# Patient Record
Sex: Female | Born: 1960 | Race: White | Hispanic: No | Smoking: Current every day smoker
Health system: Southern US, Community
[De-identification: ages and names within clinical notes are randomized; demographics above are authoritative.]

## PROBLEM LIST (undated history)

## (undated) DIAGNOSIS — R3911 Hesitancy of micturition: Secondary | ICD-10-CM

## (undated) DIAGNOSIS — J189 Pneumonia, unspecified organism: Secondary | ICD-10-CM

## (undated) DIAGNOSIS — M25519 Pain in unspecified shoulder: Secondary | ICD-10-CM

## (undated) DIAGNOSIS — F4320 Adjustment disorder, unspecified: Secondary | ICD-10-CM

## (undated) DIAGNOSIS — M2012 Hallux valgus (acquired), left foot: Secondary | ICD-10-CM

## (undated) DIAGNOSIS — E119 Type 2 diabetes mellitus without complications: Secondary | ICD-10-CM

## (undated) DIAGNOSIS — M75102 Unspecified rotator cuff tear or rupture of left shoulder, not specified as traumatic: Secondary | ICD-10-CM

## (undated) DIAGNOSIS — R7303 Prediabetes: Secondary | ICD-10-CM

## (undated) DIAGNOSIS — B370 Candidal stomatitis: Secondary | ICD-10-CM

## (undated) DIAGNOSIS — F418 Other specified anxiety disorders: Secondary | ICD-10-CM

## (undated) DIAGNOSIS — R0600 Dyspnea, unspecified: Secondary | ICD-10-CM

## (undated) DIAGNOSIS — Z87442 Personal history of urinary calculi: Secondary | ICD-10-CM

## (undated) DIAGNOSIS — F4321 Adjustment disorder with depressed mood: Secondary | ICD-10-CM

## (undated) DIAGNOSIS — I10 Essential (primary) hypertension: Secondary | ICD-10-CM

## (undated) DIAGNOSIS — D494 Neoplasm of unspecified behavior of bladder: Secondary | ICD-10-CM

## (undated) DIAGNOSIS — M75101 Unspecified rotator cuff tear or rupture of right shoulder, not specified as traumatic: Secondary | ICD-10-CM

## (undated) DIAGNOSIS — G4730 Sleep apnea, unspecified: Secondary | ICD-10-CM

## (undated) DIAGNOSIS — B351 Tinea unguium: Secondary | ICD-10-CM

## (undated) DIAGNOSIS — F3289 Other specified depressive episodes: Secondary | ICD-10-CM

## (undated) DIAGNOSIS — E785 Hyperlipidemia, unspecified: Secondary | ICD-10-CM

## (undated) DIAGNOSIS — R0602 Shortness of breath: Secondary | ICD-10-CM

## (undated) DIAGNOSIS — IMO0001 Reserved for inherently not codable concepts without codable children: Secondary | ICD-10-CM

## (undated) DIAGNOSIS — L98499 Non-pressure chronic ulcer of skin of other sites with unspecified severity: Secondary | ICD-10-CM

## (undated) DIAGNOSIS — G479 Sleep disorder, unspecified: Secondary | ICD-10-CM

## (undated) DIAGNOSIS — M21969 Unspecified acquired deformity of unspecified lower leg: Secondary | ICD-10-CM

## (undated) DIAGNOSIS — F329 Major depressive disorder, single episode, unspecified: Secondary | ICD-10-CM

## (undated) HISTORY — DX: Sleep disorder, unspecified: G47.9

## (undated) HISTORY — DX: Non-pressure chronic ulcer of skin of other sites with unspecified severity: L98.499

## (undated) HISTORY — DX: Hesitancy of micturition: R39.11

## (undated) HISTORY — DX: Reserved for inherently not codable concepts without codable children: IMO0001

## (undated) HISTORY — DX: Pain in unspecified shoulder: M25.519

## (undated) HISTORY — DX: Sleep apnea, unspecified: G47.30

## (undated) HISTORY — DX: Other specified depressive episodes: F32.89

## (undated) HISTORY — DX: Adjustment disorder with depressed mood: F43.21

## (undated) HISTORY — PX: TUBAL LIGATION: SHX77

## (undated) HISTORY — DX: Adjustment disorder, unspecified: F43.20

## (undated) HISTORY — DX: Unspecified rotator cuff tear or rupture of left shoulder, not specified as traumatic: M75.102

## (undated) HISTORY — DX: Unspecified acquired deformity of unspecified lower leg: M21.969

## (undated) HISTORY — DX: Unspecified rotator cuff tear or rupture of right shoulder, not specified as traumatic: M75.101

## (undated) HISTORY — DX: Tinea unguium: B35.1

## (undated) HISTORY — DX: Hallux valgus (acquired), left foot: M20.12

## (undated) HISTORY — DX: Essential (primary) hypertension: I10

## (undated) HISTORY — DX: Dyspnea, unspecified: R06.00

## (undated) HISTORY — DX: Shortness of breath: R06.02

## (undated) HISTORY — DX: Candidal stomatitis: B37.0

## (undated) HISTORY — DX: Other specified anxiety disorders: F41.8

## (undated) HISTORY — DX: Type 2 diabetes mellitus without complications: E11.9

## (undated) HISTORY — DX: Hyperlipidemia, unspecified: E78.5

## (undated) HISTORY — DX: Major depressive disorder, single episode, unspecified: F32.9

---

## 1984-08-08 HISTORY — PX: OTHER SURGICAL HISTORY: SHX169

## 1998-12-14 ENCOUNTER — Ambulatory Visit (HOSPITAL_COMMUNITY): Admission: RE | Admit: 1998-12-14 | Discharge: 1998-12-14 | Payer: Self-pay | Admitting: Family Medicine

## 2002-03-03 ENCOUNTER — Encounter: Payer: Self-pay | Admitting: Emergency Medicine

## 2002-03-03 ENCOUNTER — Emergency Department (HOSPITAL_COMMUNITY): Admission: EM | Admit: 2002-03-03 | Discharge: 2002-03-03 | Payer: Self-pay

## 2002-03-04 ENCOUNTER — Emergency Department (HOSPITAL_COMMUNITY): Admission: EM | Admit: 2002-03-04 | Discharge: 2002-03-05 | Payer: Self-pay

## 2002-03-11 ENCOUNTER — Emergency Department (HOSPITAL_COMMUNITY): Admission: EM | Admit: 2002-03-11 | Discharge: 2002-03-11 | Payer: Self-pay | Admitting: Emergency Medicine

## 2002-03-11 ENCOUNTER — Encounter: Payer: Self-pay | Admitting: Emergency Medicine

## 2002-03-12 ENCOUNTER — Encounter: Payer: Self-pay | Admitting: Emergency Medicine

## 2002-03-12 ENCOUNTER — Inpatient Hospital Stay (HOSPITAL_COMMUNITY): Admission: EM | Admit: 2002-03-12 | Discharge: 2002-03-15 | Payer: Self-pay | Admitting: Emergency Medicine

## 2002-03-12 ENCOUNTER — Ambulatory Visit (HOSPITAL_COMMUNITY): Admission: RE | Admit: 2002-03-12 | Discharge: 2002-03-12 | Payer: Self-pay | Admitting: Emergency Medicine

## 2002-03-13 ENCOUNTER — Encounter: Payer: Self-pay | Admitting: Surgery

## 2002-03-18 ENCOUNTER — Ambulatory Visit (HOSPITAL_COMMUNITY): Admission: RE | Admit: 2002-03-18 | Discharge: 2002-03-18 | Payer: Self-pay | Admitting: General Surgery

## 2002-03-18 ENCOUNTER — Encounter: Payer: Self-pay | Admitting: General Surgery

## 2002-03-20 ENCOUNTER — Ambulatory Visit (HOSPITAL_COMMUNITY): Admission: RE | Admit: 2002-03-20 | Discharge: 2002-03-20 | Payer: Self-pay | Admitting: General Surgery

## 2002-03-20 ENCOUNTER — Encounter: Payer: Self-pay | Admitting: General Surgery

## 2004-05-08 ENCOUNTER — Emergency Department (HOSPITAL_COMMUNITY): Admission: EM | Admit: 2004-05-08 | Discharge: 2004-05-08 | Payer: Self-pay | Admitting: Unknown Physician Specialty

## 2004-05-11 ENCOUNTER — Emergency Department (HOSPITAL_COMMUNITY): Admission: EM | Admit: 2004-05-11 | Discharge: 2004-05-11 | Payer: Self-pay | Admitting: Emergency Medicine

## 2004-05-19 ENCOUNTER — Emergency Department (HOSPITAL_COMMUNITY): Admission: EM | Admit: 2004-05-19 | Discharge: 2004-05-20 | Payer: Self-pay | Admitting: Emergency Medicine

## 2004-05-19 ENCOUNTER — Emergency Department (HOSPITAL_COMMUNITY): Admission: EM | Admit: 2004-05-19 | Discharge: 2004-05-19 | Payer: Self-pay | Admitting: Emergency Medicine

## 2004-09-01 ENCOUNTER — Other Ambulatory Visit: Admission: RE | Admit: 2004-09-01 | Discharge: 2004-09-01 | Payer: Self-pay | Admitting: Family Medicine

## 2005-02-01 ENCOUNTER — Encounter: Admission: RE | Admit: 2005-02-01 | Discharge: 2005-02-01 | Payer: Self-pay | Admitting: Family Medicine

## 2006-12-08 ENCOUNTER — Ambulatory Visit: Payer: Self-pay | Admitting: Internal Medicine

## 2006-12-15 ENCOUNTER — Ambulatory Visit: Payer: Self-pay | Admitting: Internal Medicine

## 2006-12-15 LAB — CONVERTED CEMR LAB
ALT: 21 units/L (ref 0–40)
AST: 16 units/L (ref 0–37)
Albumin: 3.6 g/dL (ref 3.5–5.2)
Alkaline Phosphatase: 49 units/L (ref 39–117)
BUN: 17 mg/dL (ref 6–23)
Basophils Absolute: 0 10*3/uL (ref 0.0–0.1)
Basophils Relative: 0.4 % (ref 0.0–1.0)
Bilirubin Urine: NEGATIVE
Bilirubin, Direct: 0.1 mg/dL (ref 0.0–0.3)
CO2: 30 meq/L (ref 19–32)
Calcium: 9 mg/dL (ref 8.4–10.5)
Chloride: 108 meq/L (ref 96–112)
Cholesterol: 197 mg/dL (ref 0–200)
Creatinine, Ser: 0.5 mg/dL (ref 0.4–1.2)
Eosinophils Absolute: 0.1 10*3/uL (ref 0.0–0.6)
Eosinophils Relative: 1.8 % (ref 0.0–5.0)
GFR calc Af Amer: 170 mL/min
GFR calc non Af Amer: 141 mL/min
Glucose, Bld: 135 mg/dL — ABNORMAL HIGH (ref 70–99)
HCT: 39.6 % (ref 36.0–46.0)
HDL: 34.9 mg/dL — ABNORMAL LOW (ref >39.0)
Hemoglobin: 13.8 g/dL (ref 12.0–15.0)
Ketones, ur: NEGATIVE mg/dL
LDL Cholesterol: 134 mg/dL — ABNORMAL HIGH (ref 0–99)
Leukocytes, UA: NEGATIVE
Lymphocytes Relative: 21.4 % (ref 12.0–46.0)
MCHC: 34.8 g/dL (ref 30.0–36.0)
MCV: 87.5 fL (ref 78.0–100.0)
Monocytes Absolute: 0.5 10*3/uL (ref 0.2–0.7)
Monocytes Relative: 6.7 % (ref 3.0–11.0)
Neutro Abs: 5.1 10*3/uL (ref 1.4–7.7)
Neutrophils Relative %: 69.7 % (ref 43.0–77.0)
Nitrite: NEGATIVE
Platelets: 368 10*3/uL (ref 150–400)
Potassium: 4.4 meq/L (ref 3.5–5.1)
RBC: 4.53 M/uL (ref 3.87–5.11)
RDW: 12.3 % (ref 11.5–14.6)
Sed Rate: 15 mm/hr (ref 0–25)
Sodium: 141 meq/L (ref 135–145)
Specific Gravity, Urine: >=1.03 (ref 1.000–1.03)
Total Bilirubin: 0.5 mg/dL (ref 0.3–1.2)
Total CHOL/HDL Ratio: 5.6
Total Protein, Urine: NEGATIVE mg/dL
Total Protein: 7 g/dL (ref 6.0–8.3)
Triglycerides: 143 mg/dL (ref 0–149)
Urine Glucose: NEGATIVE mg/dL
Urobilinogen, UA: 0.2 (ref 0.0–1.0)
VLDL: 29 mg/dL (ref 0–40)
Vit D, 1,25-Dihydroxy: 21 (ref 20–57)
WBC: 7.2 10*3/uL (ref 4.5–10.5)
pH: 5.5 (ref 5.0–8.0)

## 2007-01-19 ENCOUNTER — Ambulatory Visit: Payer: Self-pay | Admitting: Internal Medicine

## 2007-01-19 LAB — CONVERTED CEMR LAB
Hgb A1c MFr Bld: 6 % (ref 4.6–6.0)
T3, Free: 3.1 pg/mL (ref 2.3–4.2)
T4, Total: 7.3 ug/dL (ref 5.0–12.5)
TSH: 1.12 microintl units/mL (ref 0.35–5.50)

## 2007-02-07 ENCOUNTER — Ambulatory Visit: Payer: Self-pay

## 2007-02-07 ENCOUNTER — Encounter: Payer: Self-pay | Admitting: Internal Medicine

## 2007-02-13 ENCOUNTER — Encounter: Admission: RE | Admit: 2007-02-13 | Discharge: 2007-02-13 | Payer: Self-pay | Admitting: Internal Medicine

## 2008-11-28 ENCOUNTER — Ambulatory Visit (HOSPITAL_COMMUNITY): Admission: RE | Admit: 2008-11-28 | Discharge: 2008-11-28 | Payer: Self-pay | Admitting: Internal Medicine

## 2010-06-11 ENCOUNTER — Ambulatory Visit: Payer: Self-pay | Admitting: Family Medicine

## 2010-06-11 DIAGNOSIS — F3289 Other specified depressive episodes: Secondary | ICD-10-CM | POA: Insufficient documentation

## 2010-06-11 DIAGNOSIS — F329 Major depressive disorder, single episode, unspecified: Secondary | ICD-10-CM

## 2010-06-11 DIAGNOSIS — R413 Other amnesia: Secondary | ICD-10-CM

## 2010-06-11 DIAGNOSIS — F17200 Nicotine dependence, unspecified, uncomplicated: Secondary | ICD-10-CM | POA: Insufficient documentation

## 2010-06-11 LAB — CONVERTED CEMR LAB
ALT: 18 units/L (ref 0–35)
AST: 15 units/L (ref 0–37)
Albumin: 5 g/dL (ref 3.5–5.2)
Alkaline Phosphatase: 54 units/L (ref 39–117)
BUN: 17 mg/dL (ref 6–23)
CO2: 27 meq/L (ref 19–32)
Calcium: 9.7 mg/dL (ref 8.4–10.5)
Chloride: 101 meq/L (ref 96–112)
Creatinine, Ser: 0.6 mg/dL (ref 0.40–1.20)
Direct LDL: 114 mg/dL — ABNORMAL HIGH
FSH: 105.7 milliintl units/mL
Glucose, Bld: 87 mg/dL (ref 70–99)
HCT: 44.6 % (ref 36.0–46.0)
Hemoglobin: 14.7 g/dL (ref 12.0–15.0)
MCHC: 33 g/dL (ref 30.0–36.0)
MCV: 89.4 fL (ref 78.0–100.0)
Platelets: 308 10*3/uL (ref 150–400)
Potassium: 4.6 meq/L (ref 3.5–5.3)
RBC: 4.99 M/uL (ref 3.87–5.11)
RDW: 13 % (ref 11.5–15.5)
Rheumatoid fact SerPl-aCnc: 20 intl units/mL (ref 0–20)
Sed Rate: 2 mm/hr (ref 0–22)
Sodium: 136 meq/L (ref 135–145)
TSH: 1.076 microintl units/mL (ref 0.350–4.500)
Total Bilirubin: 0.2 mg/dL — ABNORMAL LOW (ref 0.3–1.2)
Total Protein: 7.7 g/dL (ref 6.0–8.3)
Vit D, 25-Hydroxy: 45 ng/mL (ref 30–89)
WBC: 8 10*3/uL (ref 4.0–10.5)

## 2010-06-14 ENCOUNTER — Telehealth: Payer: Self-pay | Admitting: Family Medicine

## 2010-06-14 ENCOUNTER — Encounter: Payer: Self-pay | Admitting: Family Medicine

## 2010-07-23 ENCOUNTER — Ambulatory Visit: Payer: Self-pay | Admitting: Family Medicine

## 2010-08-20 ENCOUNTER — Ambulatory Visit: Admission: RE | Admit: 2010-08-20 | Discharge: 2010-08-20 | Payer: Self-pay | Source: Home / Self Care

## 2010-08-20 DIAGNOSIS — M25519 Pain in unspecified shoulder: Secondary | ICD-10-CM | POA: Insufficient documentation

## 2010-08-29 ENCOUNTER — Encounter: Payer: Self-pay | Admitting: Family Medicine

## 2010-08-30 ENCOUNTER — Other Ambulatory Visit: Payer: Self-pay | Admitting: Family Medicine

## 2010-08-30 DIAGNOSIS — Z1239 Encounter for other screening for malignant neoplasm of breast: Secondary | ICD-10-CM

## 2010-08-30 DIAGNOSIS — Z1231 Encounter for screening mammogram for malignant neoplasm of breast: Secondary | ICD-10-CM

## 2010-09-02 ENCOUNTER — Ambulatory Visit (HOSPITAL_COMMUNITY)
Admission: RE | Admit: 2010-09-02 | Discharge: 2010-09-02 | Payer: Self-pay | Source: Home / Self Care | Attending: Family Medicine | Admitting: Family Medicine

## 2010-09-03 ENCOUNTER — Encounter: Payer: Self-pay | Admitting: Family Medicine

## 2010-09-03 ENCOUNTER — Ambulatory Visit
Admission: RE | Admit: 2010-09-03 | Discharge: 2010-09-03 | Payer: Self-pay | Source: Home / Self Care | Attending: Family Medicine | Admitting: Family Medicine

## 2010-09-03 ENCOUNTER — Telehealth: Payer: Self-pay | Admitting: Family Medicine

## 2010-09-03 DIAGNOSIS — M75101 Unspecified rotator cuff tear or rupture of right shoulder, not specified as traumatic: Secondary | ICD-10-CM | POA: Insufficient documentation

## 2010-09-07 NOTE — Letter (Signed)
Summary: Generic Letter  [facility]  [address]   [place], [place] [address]   Phone: [phone]  Fax: [phone]    06/14/2010  ZELDA REAMES [address] [place], [place]  [address]  Dear Ms. Fuerstenberg,   It was a pleasure to meet you last week during your visit.  I write to let you know that your labs are all within normal limits.  Your blood count, liver and kidney tests, thyroid test and other tests for rheumatoid arthritis are all within normal ranges.   The hormone test is consistent with menopause.    Please feel free to call with any questions or concerns.  I will see you at your next visit.    Sincerely,   Paula Compton MD

## 2010-09-07 NOTE — Assessment & Plan Note (Signed)
Summary: np/eo   Vital Signs:  Patient profile:   50 year old female Height:      65 inches Weight:      139.2 pounds BMI:     23.25 Temp:     98.3 degrees F oral Pulse rate:   75 / minute BP sitting:   117 / 86  (right arm)  Vitals Entered By: Arlyss Repress CMA, (June 11, 2010 3:22 PM) CC: NEW PT. neck and arm pain x 4-5 months. flu shot today. Is Patient Diabetic? No Pain Assessment Patient in pain? yes     Location: neck/arms Onset of pain  x 3-4 months.   CC:  NEW PT. neck and arm pain x 4-5 months. flu shot today.Marland Kitchen  History of Present Illness: Vicki [name] is here as a new patient.  Her biggest complaint is that she has had pains in her neck, shoulders, elbows, low back, hips, hands and wrists.  Worse since aroudn her 49th birthday in April of this  year.  Also accompanied by worse 'nerves', for which she takes xanax ('orange football shaped"), looking up in epocrates she verifies that they are 0.5mg  tabs.  Takes about 2 per day, 'off the street'.  Expresses anhedonia; on the topic of smoking, she says taht she doubts she would quit because 'it's the one thing I enjoy in life'.   Has had more spacing of her menstrual periods.  LMP July 2011.  Alopecia, generalized.   Habits & Providers  Alcohol-Tobacco-Diet     Tobacco Status: current     Tobacco Counseling: to quit use of tobacco products     Cigarette Packs/Day: 1.0  Family History: Mother died in mid-60s of ovarian cancer, also had MI and CVA in early 48s, as well as breast cancer with mastectomy in her history. Father with heart disease.  One sibling died, others are alive and healthy.   Social History: Smokes 1ppd. Formerly drank "a lot", quit that 5 yrs ago (2005-2006).  Lives with husband.  Denies any controlled substances other than xanax 0.5mg  daily, which she gets "on the street".  Used to be prescribed this many years ago.  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 Works  2 jobs. Smoking Status:  current Packs/Day:  1.0  Review of Systems       Denies weight changes, denies changes in stool.  Had mammogram 1-1/2 yrs ago: normal.  Reports short-term memory loss (couldn';t think of the word "cupcake" when talking to a co-worker yesterday).  Denies chest pain.  See HPI for description of other pains.  LMP July 29th, 2011.   Physical Exam  General:  Nervous affect, no apparent distress.  Eyes:  clear sclerae PERRL. Mouth:  Moist mucus membranes.  Clear oropharynx.  Neck:  neck supple. no thyroid nodules or masses.  no cervical adenopathy. Full active ROM neck in 4 planes Lungs:  Normal respiratory effort, chest expands symmetrically. Lungs are clear to auscultation, no crackles or wheezes. Heart:  Normal rate and regular rhythm. S1 and S2 normal without gallop, murmur, click, rub or other extra sounds. Msk:  Full ROM active in shoulders, hands, wrists, elbows, knees, hips.   No active synovitis, no ulnar deviation, no nodes in PIP/DIP joints of hands bilat.  Pulses:  palpable radial pulses bialt.  Neurologic:  gait normal.  no tremor noted intention or resting.    Impression & Recommendations:  Problem # 1:  PAIN IN JOINT,  MULTIPLE SITES (ICD-719.49) Patient with pain in multiple symmetric joint sites, including neck , shoulders, arms, wrists, hips, low back, knees.  Worse when laying down, better with movement. Strong family hx "arthritis"  (mother; had ulnar deviation by Vicki [name]'s description of mother's hands).  Will order labs and be in touch regarding medications for pain.  Hx of current use of BNZ that are not prescribed to her for "nerves"; denies using other controlled substances upon being asked multiple times.  Does take tylenol (2 tabs in the am and at hs) and Advil (one tablet on occasion).  I am suspicious of depression as a contributing factor in her pain complaint. Orders: Comp Met-[facility] [phone]) Rheum Fact-[facility] [phone]) Sed Rate (ESR)-[facility]  [phone]) Vit D, 25 OH-[facility] [phone]) CBC-[facility] [phone])  Problem # 2:  AMENORRHEA, SECONDARY (ICD-626.0) LMP July 29th 2011.  before that, was without period for several months.  Describes hot and cold; hair falling out. To check FSH, TSH.   Orders: FSH-[facility] ([id number]) TSH-[facility] ([id number])  Problem # 3:  MEMORY LOSS (ICD-780.93) Describes this in association with her 'nerves'.  Also suspect role of depression in this.  She denies SI, although says it's metaphorical that sometimes the pain is so bad that "I wish I were dead".  Says emphatically that she does not have thoughts of harming herself, husband agrees with this.   Problem # 4:  TOBACCO ABUSE (ICD-305.1) Counseled to quit. Says that she likes smoking, ambivalent about the idea of quitting.  Orders: Direct LDL-[facility] ([id number])  Problem # 5:  DEPRESSIVE DISORDER NOT ELSEWHERE CLASSIFIED (ICD-311) PHQ9 score today is 18, answers 1 pt on Q#9 but is clear about the lack of intent to harm herself.  Has been on xanax before and currently takes from illicit sources (xanax 0.5mg  tabs, at bedtime, also once during the day).  Denies previously taking antidepressants of any kind.   Other Orders: Influenza Vaccine NON MCR (26948)  Patient Instructions: 1)  It is a pleasure to have you as  a patient here as well.   2)  I am ordering several tests today, to evaluate possible causes of your multiple areas of pain, as well as your menstrual changes.  I will call you with the results to discuss best approaches to the pain once I get the labwork back. 3)  I would like to see you back in follow up in the coming 4 to 6 weeks.   Orders Added: 1)  Influenza Vaccine NON MCR [00028] 2)  Comp Met-[facility] [[id number]] 3)  Direct LDL-[facility] [[id number]] 4)  FSH-[facility] [[id number]] 5)  Rheum Fact-[facility] [86430] 6)  Sed Rate (ESR)-[facility] [85651] 7)  Vit D, 25 OH-[facility] [82306-85810] 8)  TSH-[facility] [[id number]] 9)  CBC-[facility] [85027] 10)  [facility]- New Level 3  [[address]]   Immunizations Administered:  Influenza Vaccine # 1:    Vaccine Type: Fluvax Non-MCR    Site: right deltoid    Mfr: GlaxoSmithKline    Dose: 0.5 ml    Route: IM    Given by: Tessie Fass CMA    Exp. Date: 02/05/2011    Lot #: [id number]    VIS given: 03/01/07 version given June 11, 2010.  Flu Vaccine Consent Questions:    Do you have a history of severe allergic reactions to this vaccine? no    Any prior history of allergic reactions to egg and/or gelatin? no    Do you have a sensitivity to the preservative Thimersol? no    Do you have a  past history of Guillan-Barre Syndrome? no    Do you currently have an acute febrile illness? no    Have you ever had a severe reaction to latex? no    Vaccine information given and explained to patient? yes    Are you currently pregnant? no   Immunizations Administered:  Influenza Vaccine # 1:    Vaccine Type: Fluvax Non-MCR    Site: right deltoid    Mfr: GlaxoSmithKline    Dose: 0.5 ml    Route: IM    Given by: Tessie Fass CMA    Exp. Date: 02/05/2011    Lot #: [id number]    VIS given: 03/01/07 version given June 11, 2010.

## 2010-09-07 NOTE — Progress Notes (Signed)
  Phone Note Outgoing Call Call back at [facility] Phone [phone]   Call placed by: Paula Compton MD,  June 14, 2010 1:49 PM Call placed to: Patient Summary of Call: Called patient's home to report negative labs; left voice message.  Letter sent to explain lab values.  Initial call taken by: Paula Compton MD,  June 14, 2010 1:49 PM

## 2010-09-09 ENCOUNTER — Ambulatory Visit (HOSPITAL_COMMUNITY): Payer: Self-pay

## 2010-09-09 ENCOUNTER — Inpatient Hospital Stay (HOSPITAL_COMMUNITY): Admission: RE | Admit: 2010-09-09 | Payer: Self-pay | Source: Ambulatory Visit

## 2010-09-09 NOTE — Assessment & Plan Note (Signed)
Summary: per Dr. Aundra Millet   Vital Signs:  Patient profile:   50 year old female Height:      65 inches Weight:      142 pounds BMI:     23.72 Temp:     98.2 degrees F oral Pulse rate:   72 / minute BP sitting:   121 / 76  (left arm) Cuff size:   regular  Vitals Entered By: Tessie Fass CMA (September 03, 2010 3:08 PM) CC: shoulder injection   CC:  shoulder injection.  History of Present Illness: Vicki [name] comes in today to address her R shoulder pain.  Reviewed and confirmed history from previous visit; she had R shoulder pain that was exacerbated on two occasions by falls/trauma.  Because of this, I had ordered an xray to rule out traumatic injury prior to offering kenalog injection.    Keauna went for xray yesterday, and the radiograph does not show evidence of fracture or arthritis.   She reports that she continues to have pain in the R shoulder, which aggravates her R neck as well.  Works in housekeeping, has been working through the pain.     Allergies: No Known Drug Allergies  Physical Exam  General:  alert, well appearing, no apparent distress Msk:  R shoulder without anatomic abnormalities.  Pain with Neer and Hawkins testing, pain with internal rotation (cup emptying motion).  Handgrip full and symmetric.  Isolated movement of elbow and wrist without pain.  Pulses:  palpable radial pulses bilaterally Additional Exam:  Procedure Exam:  Risks and benefits discussed with patient, given opportunities to ask questions. Posterior approach used.  Area of injection prepped with betadine and alcohol.  A mixture of 2cc Kenalog-40 and 4cc lidocaine 2% injected with 22g 1-1/2 inch needle.  Easy infiltration into the subacromial bursa.  Patient tolerated well.  Exam immediately following injection revealed improvement in pain and range of motion (patient able to dress/put sweater on with much reduced pain). Band-Aid applied to injection site.   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  Negative xrays, which were ordered given history of falls/trauma in the past 6 months that correlated with increased R shoulder pain.   She reports improvement in active ROM and marked reduction in pain after the injection today. Discussed ROM exercises, handout given to her today (authored by Dr Elissa [facility]). Discussed pendulum exercises and wall-walks.  For followup in 2 weeks.   Orders: [facility]- Est Level  3 ([address]) Injection, large joint- [facility] ([address])  Complete Medication List: 1)  Bupropion Hcl 150 Mg Xr12h-tab (Bupropion hcl) .... Sig: take 1 tab by mouth two times a day   Orders Added: 1)  [facility]- Est Level  3 [[address]] 2)  Injection, large joint- [facility] [[address]]

## 2010-09-09 NOTE — Progress Notes (Signed)
  Phone Note Outgoing Call Call back at [facility] Phone [phone]   Call placed by: Paula Compton MD,  September 03, 2010 12:27 PM Call placed to: Patient Summary of Call: Called patient and informed of results of xray.  Plan for shoulder injection this afternoon, patient given appt at 4pm. Initial call taken by: Paula Compton MD,  September 03, 2010 12:27 PM

## 2010-09-09 NOTE — Assessment & Plan Note (Signed)
Summary: F/U MEDS/RH   Vital Signs:  Patient profile:   50 year old female Height:      65 inches Weight:      142.31 pounds BMI:     23.77 BSA:     1.71 Temp:     98.3 degrees F Pulse rate:   80 / minute BP sitting:   112 / 78  Vitals Entered By: Jone Baseman CMA (August 20, 2010 2:46 PM) CC: f/u Is Patient Diabetic? No Pain Assessment Patient in pain? no        CC:  f/u.  History of Present Illness: Here for followup.  Stopped the Celexed 7 days after starting (started Dec 17, stopped Christmas Eve).  Made her feel "funny, couldn't breathe".  These feelings went away when she stopped the med.  Has been feeling more anxious, stopped taking the Xanax (off the street) 4 days ago "because it doesn't work for me."  Contiunes to feel achy in arms and legs.  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  Was beginning to feel better, but then tripped and re-injured it about 2-3 weeks later. Got worse and has remained painful.    Continues with crampy leg pain that is worse at night; not better with walking.  Achy, sore all the time. No swelling in legs or ankles.   Habits & Providers  Alcohol-Tobacco-Diet     Tobacco Status: current     Tobacco Counseling: to quit use of tobacco products     Cigarette Packs/Day: 1.0  Allergies: No Known Drug Allergies  Family History: Reviewed history from 06/11/2010 and no changes required. Mother died in mid-60s of ovarian cancer, RA, HTN, DM also had MI and CVA in early 2s, as well as breast cancer with mastectomy in her history. Father with heart disease, AAA, blood clots in leg, CHF and history MI.  One sibling died, others are alive and healthy.   Physical Exam  Neck:  neck supple. no adenopathy Lungs:  Normal respiratory effort, chest expands symmetrically. Lungs are clear to auscultation, no crackles or wheezes. Heart:  Normal rate and regular  rhythm. S1 and S2 normal without gallop, murmur, click, rub or other extra sounds. Msk:  no muscle wasting in hands; brisk cap refill bilat fingertips. Pulses:  positive radial pulses bilat Extremities:  R shoulder with active ROM pain with internal rotation.  Positive Neer and Hawkins signs on R.  Tenderness along anterior aspect of shoulder under AC joint. Passive ROM with greater range of motion, less pain.  Neurologic:  handgrip full and symmetric Psych:  PHQ9 is 15 today; zero pts on #9.    Impression & Recommendations:  Problem # 1:  SHOULDER PAIN, RIGHT (ICD-719.41)  Chronic R shoulder pain, made worse after fall onto R shoulder in early Nov 2011.  Exam today consistent with impingement, some limitation with internal rotation.  Will order xray to rule out traumatic injury before injection.   Orders: Diagnostic X-Ray/Fluoroscopy (Diagnostic X-Ray/Flu) [facility]- Est  Level 4 ([address])  Problem # 2:  DEPRESSIVE DISORDER NOT ELSEWHERE CLASSIFIED (ICD-311)  PHQ9 15 today (zero pts for #9), was 18 on 06/11/10.  Took Celexa for 1 week only.  Will switch to bupropion XR 150mg  daily, then increase to two times a day.  Followup in 2 weeks.  The following medications were removed from the medication list:    Celexa 20 Mg  Tabs (Citalopram hydrobromide) ..... Sig: take 1 tab by mouth one time daily generic if available Her updated medication list for this problem includes:    Bupropion Hcl 150 Mg Xr12h-tab (Bupropion hcl) ..... Sig: take 1 tab by mouth two times a day  Orders: [facility]- Est  Level 4 ([address])  Problem # 3:  TOBACCO ABUSE (ICD-305.1)  Counseled to quiit.  Continues to smoke 1 ppd; would like to quit, is something that worries her. Discussed the role of bupropion in helping curb cravings.   Orders: [facility]- Est  Level 4 ([address])  Complete Medication List: 1)  Bupropion Hcl 150 Mg Xr12h-tab (Bupropion hcl) .... Sig: take 1 tab by mouth two times a day  Patient Instructions: 1)  It was a  pleasure to see you today.  2)  For the Right shoulder pain, I would like to do an x-ray to be sure there is no injury after your fall.  If the x-ray is negative, then I would like to do an injection of the shoulder. 3)  I discontinued your Celexa, since you had stopped it for side effects.  I sent a prescription for Bupropion XL 150mg , take 1 tablet one time daily for one week, then take 1 tablet every 12 hours (twice daily). 4)  I would like to see you back in 2 to 3 weeks for followup.  Prescriptions: BUPROPION HCL 150 MG XR12H-TAB (BUPROPION HCL) SIG: Take 1 tab by mouth two times a day  #60 x 2   Entered and Authorized by:   Paula Compton MD   Signed by:   Paula Compton MD on 08/20/2010   Method used:   Faxed to ...       [facility] Department (retail)       [address] [place], [place]  [address]       Ph: [phone]       Fax: [phone]   RxID:   [id number]    Orders Added: 1)  Diagnostic X-Ray/Fluoroscopy [Diagnostic X-Ray/Flu] 2)  [facility]- Est  Level 4 [[address]]

## 2010-09-09 NOTE — Assessment & Plan Note (Signed)
Summary: f/u,df   Vital Signs:  Patient profile:   50 year old female Height:      65 inches Weight:      139.2 pounds BMI:     23.25 Pulse rate:   88 / minute BP sitting:   127 / 80  (right arm) Cuff size:   regular  Vitals Entered By: Arlyss Repress CMA, (July 23, 2010 3:25 PM) CC: pt states 'feels like panic attacks.  worse over the last year. starts when going to work or going to bed'  Is Patient Diabetic? No Pain Assessment Patient in pain? no        CC:  pt states 'feels like panic attacks.  worse over the last year. starts when going to work or going to bed' .  History of Present Illness: Mrs Hund comes in today accompanied by her husband.  Complains that she has had "panic attacks" which awaken her from sleep in the night.  Accompanied by feelings of dread, sweats.  Worse lately.  Has been treated for these in the past by Dr Artist Pais in 2008, cannot recall name of med.  Takes xanax which she gets from friends, takes 1/2 tab at bedtime for this to help her sleep.  Does not drink alcohol now, but states she 'used to drink', was 'never an alcoholic'.  Denies other meds or drugs.  Continues to smoke about 1ppd.  Not interested in quitting.   Stress levels are up because her daughter is in financial problems, may come to live with her.  Son accidently shot and killed son-in-law last year, this is still having repercussions in her life.   Habits & Providers  Alcohol-Tobacco-Diet     Tobacco Status: current     Tobacco Counseling: to quit use of tobacco products     Cigarette Packs/Day: 1.0  Current Medications (verified): 1)  Celexa 20 Mg Tabs (Citalopram Hydrobromide) .... Sig: Take 1 Tab By Mouth One Time Daily Generic If Available  Allergies (verified): No Known Drug Allergies  Family History: Reviewed history from 06/11/2010 and no changes required. Mother died in mid-60s of ovarian cancer, also had MI and CVA in early 40s, as well as breast cancer with mastectomy in  her history. Father with heart disease.  One sibling died, others are alive and healthy.   Review of Systems       LMP about 6 months ago.  Denies voice changes, hoarseness, trouble swallowing. Keeps up with mammography, last one 2 yrs ago.    Physical Exam  General:  alert, well appearing, no apparent distress. Nervous affect.    Impression & Recommendations:  Problem # 1:  INSOMNIA UNSPECIFIED (ICD-780.52)  Awakens at night wiht 'panic attacks". Denies chest pain.  Consistent with panic; has symptoms of depression.  Denies any SI or HI adamantly on screening today.  WIll start with SSRI, reevaluate in 2 weeks.  Consider [facility] for eval.   Orders: [facility]- Est Level  3 ([address])  Problem # 2:  OTHER SCREENING BREAST EXAMINATION (ICD-V76.19) For routine mammogram.  Discussed other health screenings due at age 81, such as colon cancer screening.  Continue PAP screenings, etc.  Will address again at future visit (in 3 weeks).  Orders: Mammogram (Screening) (Mammo) [facility]- Est Level  3 ([address])  Complete Medication List: 1)  Celexa 20 Mg Tabs (Citalopram hydrobromide) .... Sig: take 1 tab by mouth one time daily generic if available  Patient Instructions: 1)  It was a pleasure to see  you today.  2)  For the panic attacks and other symptoms, I am starting you on Celexa 20mg  one time daily.  I ask that you pick it up at [facility]). 3)  I would like you to followup with me in 3 weeks to see how you are doing. Prescriptions: CELEXA 20 MG TABS (CITALOPRAM HYDROBROMIDE) SIG: Take 1 tab by mouth one time daily Generic if available  #30 x 6   Entered and Authorized by:   Paula Compton MD   Signed by:   Paula Compton MD on 07/23/2010   Method used:   Faxed to ...       [facility] Department (retail)       [address] [place], [place]  [address]       Ph: [phone]       Fax: [phone]   RxID:   [id number]    Orders  Added: 1)  Mammogram (Screening) [Mammo] 2)  [facility]- Est Level  3 [[address]]

## 2010-09-15 NOTE — Miscellaneous (Signed)
Summary: Procedures Consent  Procedures Consent   Imported By: De Nurse 09/07/2010 15:07:38  _____________________________________________________________________  External Attachment:    Type:   Image     Comment:   External Document

## 2010-09-16 ENCOUNTER — Ambulatory Visit (HOSPITAL_COMMUNITY)
Admission: RE | Admit: 2010-09-16 | Discharge: 2010-09-16 | Disposition: A | Discharge status: Home / Self Care | Payer: Self-pay | Source: Ambulatory Visit | Attending: Family Medicine | Admitting: Family Medicine

## 2010-09-16 DIAGNOSIS — Z1231 Encounter for screening mammogram for malignant neoplasm of breast: Secondary | ICD-10-CM | POA: Insufficient documentation

## 2010-09-21 ENCOUNTER — Encounter: Payer: Self-pay | Admitting: Family Medicine

## 2010-09-21 ENCOUNTER — Ambulatory Visit (INDEPENDENT_AMBULATORY_CARE_PROVIDER_SITE_OTHER): Payer: Self-pay | Admitting: Family Medicine

## 2010-09-21 DIAGNOSIS — F329 Major depressive disorder, single episode, unspecified: Secondary | ICD-10-CM

## 2010-09-21 DIAGNOSIS — F3289 Other specified depressive episodes: Secondary | ICD-10-CM

## 2010-09-21 DIAGNOSIS — M25519 Pain in unspecified shoulder: Secondary | ICD-10-CM

## 2010-09-21 MED ORDER — BUSPIRONE HCL 10 MG PO TABS: 10.0000 mg | ORAL_TABLET | Freq: Two times a day (BID) | ORAL | Status: DC

## 2010-09-21 NOTE — Assessment & Plan Note (Signed)
Improved, almost resolved following injection on Jan 27th.  Not taking any oral analgesics.  She is to notify me if she has recurrent pain; would consider reinjection 4 months after initial injection; we are hopeful she will not need.

## 2010-09-21 NOTE — Progress Notes (Signed)
  Subjective:    Patient ID: Vicki [name], female    DOB: 1961-05-06, 50 y.o.   MRN: [id number]  HPI  Vicki [name] is here for follow up of her R shoulder pain, as well as her nerves.  R shoulder pain: Had injection on Jan 27th, reports marked improvement in R shoulder pain.  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  Able to lay on the R side in bed without pain.  No weakness.  Says the shoulder makes a cracking sound occasionally, without pain.  She is not taking any oral analgesics at this time (was taking Tylenol before).  Anxiety:  Wellbutrin made her feel sick to her stomach, constipated.  Stopped taking it on Feb 3rd, still is feeling constipated.  Smoking more, which she attributes to stress from daughter and 2 grandsons (ages 44 and 5 yrs) moving back in, asking for money and babysitting all the time.  Tensions in home.  Husband is coming up for Disability hearing on Mar 22nd.    Review of Systems  No weakness in arms./hands; no R shoulder pain.  No SI.  Does not feel depressed, but reports poor sleep related to grandsons' bedtime habits/awakening.  Was taking a 1/2 tab xanax to sleep, has not taken in over 4 days.      Objective:   Physical Exam Well appearing, animated affect.  NAD>  R shoulder full active and passive ROM; some crepitus with passive R arm raise.  Full handgrip bilat.  Sensation grossly intact.  Internal rotation full and symmetric bilat.        Assessment & Plan:

## 2010-09-21 NOTE — Patient Instructions (Signed)
It was a pleasure to see you.   I am giving you a prescription for Buspirone 10mg  tablets; take 1/2 tab two times a day for the first week, then take 1 whole tablet twice daily.  I am giving you a paper prescription, to take to [facility] on [address].  I would like to see you back in 2 months, or sooner if needed.

## 2010-09-21 NOTE — Assessment & Plan Note (Signed)
Previous PHQ9 score in the middle of January was 15; not done today.  She denies depressive sxs, but reports increased anxiety related to daughter/grandchildren being in her home.  Money issues.  Intolerance of Celexa and Wellbutrin.  Will try Buspirone for anxiety; discussed instructions, given chance to ask questions.  Will followup in 2 months, or sooner as needed (at her request).  She and husband are trying not to incur more expenses until after his Disability hearing in late March.

## 2010-10-01 ENCOUNTER — Encounter: Payer: Self-pay | Admitting: *Deleted

## 2010-10-19 ENCOUNTER — Telehealth: Payer: Self-pay | Admitting: Family Medicine

## 2010-10-19 NOTE — Telephone Encounter (Signed)
Forward to Breen for review 

## 2010-10-19 NOTE — Telephone Encounter (Signed)
Pt fell on arm again, wants to know if MD will authorize another injection? Breen booked until 3/27.

## 2010-10-20 NOTE — Telephone Encounter (Signed)
Patient received her last R shoulder injection on Jan 27th.  It is too soon to receive another one.  May refer to Physical Therapy for additional evaluation and treatment.

## 2010-10-20 NOTE — Telephone Encounter (Signed)
Left message for patient to return call, see message below from Dr Mauricio Po.Vilma Meckel, Rodena Medin

## 2010-11-02 ENCOUNTER — Encounter: Payer: Self-pay | Admitting: Family Medicine

## 2010-11-02 ENCOUNTER — Ambulatory Visit (INDEPENDENT_AMBULATORY_CARE_PROVIDER_SITE_OTHER): Payer: Self-pay | Admitting: Family Medicine

## 2010-11-02 VITALS — BP 136/82 | Temp 98.3°F | Ht 65.0 in | Wt 145.0 lb

## 2010-11-02 DIAGNOSIS — M25819 Other specified joint disorders, unspecified shoulder: Secondary | ICD-10-CM

## 2010-11-02 MED ORDER — TRAMADOL HCL 50 MG PO TABS: 50.0000 mg | ORAL_TABLET | Freq: Three times a day (TID) | ORAL | Status: DC | PRN

## 2010-11-02 NOTE — Patient Instructions (Signed)
It was a pleasure to see you today.    For your right shoulder, I do not advise a repeat injection just 2 months from your previous injection.  I believe you have a rotator cuff injury.  I am referring you to physical therapy for this.  Also, an MRI of the R shoulder to determine the extent of your injury.   I am prescribing you Tramadol 50mg  take 1 tablet every 8 hours as needed. May make you sleepy.  Do not take with alcohol.    I would like to see you in 2 months or sooner as needed.

## 2010-11-02 NOTE — Progress Notes (Signed)
  Subjective:    Patient ID: Vicki [name], female    DOB: [birth year], 50 y.o.   MRN: [id number]  HPI Larey Seat at last snow in late Feb, exacerbated R shoulder pain that had been doing well after subacromial injection on Sep 03, 2010.  Now is unable to lift R arm to horizontal.  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   She notes that she is still taking the Buspar with some relief. Is not taking any BNZ from friends or elsewhere.   Review of Systems     Objective:   Physical Exam  Constitutional: She appears well-developed and well-nourished. No distress.  HENT:  Head: Normocephalic.  Neck: Normal range of motion. Neck supple.  Musculoskeletal:       No visible anatomic asymmetry on inspection.  No redness or calor over shoulder joints bilaterally.  Limited active abduction of R shoulder.  Cannot reach within 20 degrees of horizontal plane.  Limited internal and external rotation.   When patient performs "empty can" test (with pain), she is unable to resist minimal force downward on R arm.   External rotation limited by pain.  Apprehension to try to move external rotation without resistance.  Radial pulses bilaterally are full and palpable.  Sensation in fingers is grossly intact.   Skin: She is not diaphoretic.          Assessment & Plan:

## 2010-11-02 NOTE — Assessment & Plan Note (Signed)
Vicki [name] has recurrent R shoulder pain, that initially responded to subacromial steroid injection but has since been aggravated and is now worse.  She is unable to lift arm to horizontal, has limited internal and external rotation, and positive Hawkins and Neers tests. Given the recent steroid injection (exactly 2 months ago today), I have informed her that I feel it may be harmful to reinject today.  Will image the shoulder for suspicion of full rotator cuff tear.  To send to Physical Therapy for evaluation and treatment as well.  May use Tramadol as needed for pain in the interim.  Never had a seizure, not drinking alcohol now.  Discussed side effects of med, including possible somnolence.  For follow up in 1-2 months.

## 2010-11-03 ENCOUNTER — Ambulatory Visit (HOSPITAL_COMMUNITY)
Admission: RE | Admit: 2010-11-03 | Discharge: 2010-11-03 | Disposition: A | Discharge status: Home / Self Care | Payer: Self-pay | Source: Ambulatory Visit | Attending: Family Medicine | Admitting: Family Medicine

## 2010-11-03 DIAGNOSIS — W19XXXA Unspecified fall, initial encounter: Secondary | ICD-10-CM | POA: Insufficient documentation

## 2010-11-03 DIAGNOSIS — S43429A Sprain of unspecified rotator cuff capsule, initial encounter: Secondary | ICD-10-CM | POA: Insufficient documentation

## 2010-11-04 ENCOUNTER — Telehealth: Payer: Self-pay | Admitting: Family Medicine

## 2010-11-04 NOTE — Telephone Encounter (Signed)
Called patient to discuss results of MRI R shoulder, which revealed full-thickness supraspinatous and infraspinatous tears.  She is with less pain during the day, worse pain at night.  Still able to work.  Has appt with Physical Therapy on April 9th at 830am.  She may take an additional Tramadol 50mg  at night (ie, 100mg  dose at night) as needed.  If PT and meds not helping with pain and improvement in function, may refer to Sports Medicine prior to considering referral to [facility] for possible surgical intervention.  Patient had subacromial injection on Jan 27th with good relief until a recent fall and exacerbation of her R shoulder pain.

## 2010-11-15 ENCOUNTER — Ambulatory Visit: Payer: Self-pay | Attending: Family Medicine

## 2010-11-15 DIAGNOSIS — IMO0001 Reserved for inherently not codable concepts without codable children: Secondary | ICD-10-CM | POA: Insufficient documentation

## 2010-11-15 DIAGNOSIS — M25619 Stiffness of unspecified shoulder, not elsewhere classified: Secondary | ICD-10-CM | POA: Insufficient documentation

## 2010-11-15 DIAGNOSIS — M25519 Pain in unspecified shoulder: Secondary | ICD-10-CM | POA: Insufficient documentation

## 2010-11-15 DIAGNOSIS — R5381 Other malaise: Secondary | ICD-10-CM | POA: Insufficient documentation

## 2010-11-22 ENCOUNTER — Ambulatory Visit: Payer: Self-pay

## 2010-11-25 ENCOUNTER — Ambulatory Visit: Payer: Self-pay

## 2010-11-29 ENCOUNTER — Ambulatory Visit: Payer: Self-pay | Admitting: Physical Therapy

## 2010-12-02 ENCOUNTER — Encounter: Payer: Self-pay | Admitting: Physical Therapy

## 2010-12-21 ENCOUNTER — Telehealth: Payer: Self-pay | Admitting: Family Medicine

## 2010-12-21 NOTE — Telephone Encounter (Signed)
pts husband asking to speak with MD re: pts med tramadol, wants to first ask about increasing dosage then get a refill.

## 2010-12-21 NOTE — Telephone Encounter (Signed)
Forward to Breen for review 

## 2010-12-21 NOTE — Assessment & Plan Note (Signed)
[facility]                           PRIMARY CARE OFFICE NOTE   NAZLI, PENN                        MRN:          [id number]  DATE:12/08/2006                            DOB:          1960-09-06    CHIEF COMPLAINT:  New patient to practice.   HISTORY OF PRESENT ILLNESS:  The patient is a 50 year old white female  here to establish primary care, she was previously followed by [facility].  She has been fairly healthy for most of her life.  Denies any history of stroke or heart attack.  She does have a long  history of tobacco abuse, started smoking at age 29.  She averages about  a pack per day.  During the last several months she has experienced  intermittent dyspnea/shortness of breath.  She denies any associated  chest heaviness.   She also complains of periodic shoulder pain and neck pain.  She works  as a Advertising copywriter and she attributes aches and pains to strenuous  physical activity.   PAST MEDICAL HISTORY SUMMARY:  1. History of remote pyelonephritis during her first pregnancy,      question ruptured spleen approximately 3 years ago.  2. Chronic anxiety.   CURRENT MEDICATIONS:  None.   ALLERGIES TO MEDICATIONS:  NONE KNOWN.   SOCIAL HISTORY:  The patient is married, lives with her husband.  She  has 3 children, all grown.  She has a 55 year old that lives with her,  currently works as a Advertising copywriter.   FAMILY HISTORY:  Mother deceased at age 60 secondary to ovarian cancer,  stroke, and heart attack.  Father is alive and in fairly good health.   HABITS:  Occasional alcohol, 38 pack years in terms of tobacco abuse.  Currently smokes approximately 1 pack per day.   REVIEW OF SYSTEMS:  No fevers, chills, occasional rhinitis.  No chest  pain, no palpitations, positive dyspnea on exertion, shortness of  breath, occasional heartburn, no nausea or vomiting, constipation,  diarrhea, no dark stools or blood in her stool.   All other systems  negative.   PHYSICAL EXAMINATION:  VITAL SIGNS:  Weight is 161 pounds, temperature  is 99.5, pulse is 73, blood pressure is 127/89 on the left in a seated  position.  GENERAL:  The patient is a pleasant 50 year old white female who appears  older than stated age.  HEENT:  Normocephalic/atraumatic, pupils were equal and reactive to  light bilaterally, extraocular motility was intact, the patient was  anicteric, conjunctivae was within normal limits, external auditory  canals and tympanic membranes were clear bilaterally.  OROPHARYNGEAL:  Revealed upper and lower dental plate, otherwise  unremarkable.  NECK:  Supple, no adenopathy, carotid bruit, or thyromegaly.  CHEST:  Slight decreased breath sounds throughout, no active wheezing,  rhonchi, or rales.  CARDIOVASCULAR:  Regular rate and rhythm, no significant murmurs, rubs,  gallops appreciated.  ABDOMEN:  Soft, nontender, positive bowel sounds, no organomegaly.  MUSCULOSKELETAL:  No clubbing, cyanosis, or edema.  The patient had  palpable pedis dorsalis pulses.  The patient had somewhat a [place]  complexion otherwise skin was warm and dry.  NEUROLOGIC:  Cranial nerves II-XII was grossly intact, she was nonfocal.   EKG was performed in the office which showed normal sinus rhythm at 63  beats per minute, no acute ST changes were noted, normal R wave  progression.   IMPRESSION:  1. Dyspnea in a 50 year old white female with longstanding tobacco      abuse.  2. History of generalized anxiety disorder.  3. Family history of ovarian cancer and breast cancer.  4. Health maintenance.   RECOMMENDATIONS:  Patient's dyspnea may be secondary to coronary  disease.  She will be sent for a treadmill Myoview.   She was strongly encouraged to discontinue smoking and she is motivated  at this time to try Chantix.  She was given a prescription along with  educational material regarding smoking cessation.  We will send her for   screening and a chest x-ray today and on followup visit we will discuss  the need for screening mammogram, Pap, and pelvic.  Additional screening  labs will also be ordered prior to the followup exam in 6 weeks.     Barbette Hair. Artist Pais, DO  Electronically Signed    RDY/MedQ  DD: 12/08/2006  DT: 12/08/2006  Job #: [id number]

## 2010-12-22 ENCOUNTER — Telehealth: Payer: Self-pay | Admitting: Family Medicine

## 2010-12-22 MED ORDER — TRAMADOL HCL 50 MG PO TABS: ORAL_TABLET | ORAL | Status: DC

## 2010-12-22 NOTE — Telephone Encounter (Signed)
Returned call to patient's home, spoke with husband Vicki [name].  Vicki [name] has some relief with Tramadol 50mg  every 6 hours, but would like a dose increase.  Still in pain despite going to Physical Therapy.  Will increase Tramadol dose to 50-100mg  every 8 hours; refer for evaluation at Sports Medicine as well.
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
## 2010-12-31 ENCOUNTER — Encounter: Payer: Self-pay | Admitting: Family Medicine

## 2010-12-31 ENCOUNTER — Ambulatory Visit (INDEPENDENT_AMBULATORY_CARE_PROVIDER_SITE_OTHER): Payer: Self-pay | Admitting: Family Medicine

## 2010-12-31 VITALS — BP 123/78

## 2010-12-31 DIAGNOSIS — M25569 Pain in unspecified knee: Secondary | ICD-10-CM

## 2010-12-31 NOTE — Progress Notes (Signed)
  Subjective:    Patient ID: Vicki [name], female    DOB: [birth year], 50 y.o.   MRN: [id number]  HPI  RIGHt shoulder pain with known right rotator cuff tear on mRI. Has had another fall at home, after  The CSI in January. The CSI seemed to help a lot, but then she had another fall and shoulder is really bothering her in overhead work. 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 Likes her job but is not as able to use her arm in overhead activities, lacks strength. Also starting to have some left shoulder pain that is similar but not as bad. Right hand dominant.   Has been in PT for 2 sessions and it actually seemed to make it worse.  Review of Systems Pertinent review of systems: negative for fever or unusual weight change.    Objective:   Physical Exam   Shoulder without obvious defect. Full strength in:              Internal rotation and               External rotation.               Supraspinatus testing reveals a lot of weakness and this increases her pain. Distally neurovasculalry intact Impingement signs are  Negative  [place]: limited ; Supraspinatus is retracted from it's typical attachment on the humeral head.  Girtha Rm is partially intact but has some areas of inhomogeneity c/w muscle atrophy. Teres minor is intact. Bicep tendon is intact --there is some edema surrounding the tendon. No impingement  is noted    Assessment & Plan:  1: Known   Rotator cuff tear with retraction, now with some increase in shoulder pain. We discussed options.  Sounds like fairly longstanding tear with retraction seen back in January so not good surgical candidate. I suspect this tear is fairly old and she has injured the subscap and supporting muscles in he ast two falls. We did a subacromial injection today--her muscle bulk as seen on [place] makes me think she would benefit from general strengthening program and will start rotator cuff program. RTC 4 w

## 2011-01-18 ENCOUNTER — Ambulatory Visit (HOSPITAL_COMMUNITY)
Admission: RE | Admit: 2011-01-18 | Discharge: 2011-01-18 | Disposition: A | Discharge status: Home / Self Care | Payer: Self-pay | Source: Ambulatory Visit | Attending: Family Medicine | Admitting: Family Medicine

## 2011-01-18 ENCOUNTER — Telehealth: Payer: Self-pay | Admitting: Family Medicine

## 2011-01-18 DIAGNOSIS — M25569 Pain in unspecified knee: Secondary | ICD-10-CM | POA: Insufficient documentation

## 2011-01-18 NOTE — Telephone Encounter (Signed)
[name] / Vicki [name] I do not remember ordering standing knee films on her but evidently I did. An you call her and tell her the films looked good--she does not have a lot of joint sopace loss--and see if you can figure out why I ordered these and if we need to do anything else? Was she having knee pain? Was she worried about arthritis? I see nothing in my note THANKS! Denny Levy

## 2011-01-19 NOTE — Telephone Encounter (Signed)
Spoke with pt- she states her knee pain has been better since shoulder inj.  States she has a family hx of arthritis.  Advised her that x-rays were good, and f/u with Dr. Jennette Kettle about knee pain as needed.  She states she is scheduled for f/u for shoulder 6/18- advised her to keep this appt.

## 2011-01-24 ENCOUNTER — Encounter: Payer: Self-pay | Admitting: Family Medicine

## 2011-01-24 ENCOUNTER — Ambulatory Visit (INDEPENDENT_AMBULATORY_CARE_PROVIDER_SITE_OTHER): Payer: Self-pay | Admitting: Family Medicine

## 2011-01-24 VITALS — BP 105/71

## 2011-01-24 DIAGNOSIS — M75101 Unspecified rotator cuff tear or rupture of right shoulder, not specified as traumatic: Secondary | ICD-10-CM

## 2011-01-24 DIAGNOSIS — S43429A Sprain of unspecified rotator cuff capsule, initial encounter: Secondary | ICD-10-CM

## 2011-01-24 NOTE — Progress Notes (Signed)
  Subjective:    Patient ID: Vicki [name], female    DOB: 09-20-1960, 50 y.o.   MRN: [id number]  HPI 1.  RIGHT shoulder pain:  50 yo F here for fu from shoulder pain.  First diagnosed rotator cuff tear Jan 2012 on MRI, given corticosteroid shot with some relief at that point, relief ended when she fell on her shoulder twice while at work.  Works in Stage manager at [employer]  Seen in [facility] 5/25, given subacromial corticosteroid shot with much improvement.  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  Has been off of work for past 8 days due to vacation, has not been completing rotator cuff exercises. When pain is very bad she takes Tramadol.  No numbness, paresthesias, radiating pain.  Not dropping anything, no complaints of weakness.    Review of Systems Pertinent review of systems: negative for fever or unusual weight change.      Objective:   Physical Exam Gen:  Alert, cooperative patient who appears stated age in no acute distress.  Vital signs reviewed. MSK:  Full active and passive range of motion, not limited by pain.  Empty can test negative for weakness, some pain.  Good internal and external rotation, good "lift-off" test without pain.   Neuro:  Sensation and motor intact Right arm and hand CV:  Good distal pulses Right arm.         Assessment & Plan:  1: Rotator cuff tear --chronic--imporved significantly from last visit where she received CSI. Continue rotator cuff program long term as she has non surgical rotator cuff tear  And needs chronic strengthening of her other muscles. We discussed   This in detail. RTC prn

## 2011-01-24 NOTE — Assessment & Plan Note (Signed)
Much improved today since last injection   Recommended to start her rotator cuff exercises today to strengthen other muscles of rotator cuff.   To fu as needed for pain if she pain persists when she starts working again.

## 2011-02-04 ENCOUNTER — Encounter: Payer: Self-pay | Admitting: Family Medicine

## 2011-02-04 ENCOUNTER — Ambulatory Visit: Payer: Self-pay | Admitting: Family Medicine

## 2011-02-04 ENCOUNTER — Ambulatory Visit (INDEPENDENT_AMBULATORY_CARE_PROVIDER_SITE_OTHER): Payer: Self-pay | Admitting: Family Medicine

## 2011-02-04 VITALS — BP 125/86 | HR 76 | Temp 98.7°F | Ht 65.0 in | Wt 143.0 lb

## 2011-02-04 DIAGNOSIS — G479 Sleep disorder, unspecified: Secondary | ICD-10-CM

## 2011-02-04 MED ORDER — NORTRIPTYLINE HCL 25 MG PO CAPS: 25.0000 mg | ORAL_CAPSULE | Freq: Every day | ORAL | Status: DC

## 2011-02-04 NOTE — Patient Instructions (Addendum)
It was a pleasure to see you today.   For the sleep problems, I am prescribing a new medication called nortriptyline.  Take 1 tablet at bedtime.   I recommend that you not have any coffee or caffeinated beverages after 4pm, to help you to sleep better.   I would like to have you back in 2 to 4 weeks for a Pap Smear and to follow up on the sleep problems.   PLEASE SCHEDULE FOR PAP WITH DR Mauricio Po.   Insomnia Insomnia means you have trouble falling or staying asleep.  It affects about one person in three at different times and is usually related to stress from work, school, or personal relations. Insomnia is also a sign of depression or anxiety. Other medical problems that cause insomnia include conditions that cause pain, night leg cramps, coughing, shortness of breath, urinary problems, and fevers. Sleep apnea is an abnormal breathing pattern at night that can cause insomnia and loud snoring. Certain medications and excess intake of caffeine drinks (coffee, tea, colas) can also interfere with normal sleep. Treatment for insomnia depends on the cause. Besides specific medical treatment, the following measures can help you relax and get better sleep. Get regular exercise every day, at least several hours before bed time. Try to get to bed at the same time every night. Take a hot bath before retiring to help you relax. Do not stay in bed if you are unable to sleep. During the daytime avoid staying in bed to watch television, eat, or read. Reduce unwanted noise and light in your room. Keep your room at a comfortable temperature. Avoid alcohol as it causes one to sleep less soundly, may cause you to awaken during the night, and can leave you feeling groggy the next day. Using a mild sedative prescribed or suggested by your caregiver may be needed, but the daily use of sleeping pills is not recommended. Anti-depressant medicines can improve sleep in people with depression. Please call your doctor for follow up  care to better understand the cause and proper treatment of your insomnia. Document Released: 09/01/2004 Document Re-Released: 10/21/2008 [facility] Patient Information 2011 [place], [place].

## 2011-02-04 NOTE — Progress Notes (Signed)
  Subjective:    Patient ID: Vicki [name], female    DOB: April 28, 1961, 50 y.o.   MRN: [id number]  HPI Vicki [name] is here for follow up on her sleep problems, nerves.  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  Now that she 's back on the job, the pain is back and most notable at night.  Better than previously.   She reports difficulty falling asleep, can stay awake in bed for 1/2 hour.  Drinks several cups of coffee "from the time I wake up to when I go to sleep".  Does not drink any alcohol. Reports stress related to her husband, who is scheduled for cataract surgery and has been irritable lately due to his visual impairment.  She is smoking more than usual, up to 1-1/2 packs daily as stress relief.  Is not taking the Xanax, which she used to get from friends to help her sleep.  No other sleep aids.   She denies depression, but states that she does not find much that pleases her.  Took vacation and just "stayed around the house", does not like to go out on walks like she usually did.   Previously she has been tried on Buspar which did not help, and antidepressants, which did not agree with her: Celexa ("made me feel sick"), Wellbutrin "made me feel crazy, especially when I'd smoke a cigarette".  A friend recommended she try lorazepam for nerves and sleep; Vicki [name] doubts the friend's judgement because "she's crazy in the head".  Had her last PAP smear over 3 years ago.  Has had "freezing" of her cervix in the past.    Family Hx; Mother with ovarian cancer.      Review of Systems See HPI. Also, No SI.     Objective:   Physical Exam Alert, no apparent distress. Appears flat affect.  Neck supple.  Gait unremarkable. Reasonable insight and judgement in our conversation       Assessment & Plan:

## 2011-02-04 NOTE — Assessment & Plan Note (Signed)
Patient with several items which could contribute to her sleep problem, including caffeine, stress of husband's illness, and her work/financial situation.  Discussed reasons why I do not think that lorazepam is a good way to combat the sleep problem.  Discussed non-pharmacologic interventions, starting with the caffeine.  Also, recommended walking or other exercise.  Bedroom only for sleep, not eating, tv or reading.  Regular sleep time and wake time.  Will try TCA at bedtime to see if this helps.  May stop the Buspar and follow in 2-4 weeks. Sleep diary.

## 2011-02-08 NOTE — Telephone Encounter (Signed)
Pt has been seen 3 times since this request, closing encounter.

## 2011-02-25 ENCOUNTER — Ambulatory Visit (INDEPENDENT_AMBULATORY_CARE_PROVIDER_SITE_OTHER): Payer: Self-pay | Admitting: Family Medicine

## 2011-02-25 ENCOUNTER — Encounter: Payer: Self-pay | Admitting: Family Medicine

## 2011-02-25 VITALS — BP 125/77 | HR 83 | Temp 99.3°F | Wt 144.0 lb

## 2011-02-25 DIAGNOSIS — Z124 Encounter for screening for malignant neoplasm of cervix: Secondary | ICD-10-CM

## 2011-02-25 DIAGNOSIS — G479 Sleep disorder, unspecified: Secondary | ICD-10-CM

## 2011-02-25 DIAGNOSIS — F3289 Other specified depressive episodes: Secondary | ICD-10-CM

## 2011-02-25 DIAGNOSIS — F329 Major depressive disorder, single episode, unspecified: Secondary | ICD-10-CM

## 2011-02-25 MED ORDER — BUSPIRONE HCL 10 MG PO TABS: 10.0000 mg | ORAL_TABLET | Freq: Two times a day (BID) | ORAL | Status: DC

## 2011-02-25 NOTE — Patient Instructions (Signed)
It was a pleasure to see you today.  I will contact you with the results of your Pap smear, which we did today.   I refilled the Buspar today, please take 1 tablet twice daily.  I would like to see you back to address the sleep issues in detail, in the coming 1-2 months.

## 2011-02-25 NOTE — Assessment & Plan Note (Signed)
Daytime sleepiness coupled with poor quality sleep during the night.  Patient seeks sedatives; TCAs have not helped.  Benadryl made her feel ill as well.  Concern for OSA or other hypoventilation at nighttime, thus will opt for Epworth Sleepiness Scale and nighttime pulse oximetry with [facility].  To follow up on this at next visit.

## 2011-02-25 NOTE — Assessment & Plan Note (Signed)
Today she presents with more complaints associated with anxiety than depression. Buspar has helped.  Will opt for this, I am concerned about the idea of benzodiazepines in this patient and have addressed the risks with the patient before.

## 2011-02-25 NOTE — Progress Notes (Signed)
  Subjective:    Patient ID: Vicki [name], female    DOB: [birth year], 50 y.o.   MRN: [id number]  HPI Vicki [name] comes in today for routine PAP smear.  Had a prior cryo performed after an abnormal Pap in the past; reports last Pap done about 3-4 years ago in an office off [address]  Denies vaginal bleeding now, last menses around 1-1/2 years ago.  Always had been at regular monthly intervals until the end, when it became a little irregular.  Denies any vaginal discharge, no dysuria or urinary incontinence.  Wishes to discuss her sleep issues.  The nortriptyline did not help her sleep, made her feel bad.  The buspirone, which previously she thought didn't help, has helped if she takes 10mg  twice daily. Would like to go back on this.   Reports trouble initiating sleep ("except when I used to take Xanax").  Snores and has fitful sleep.  Her husband complains that she "hits him" when she sleeps.  Often will take naps (2 hours at a time) in the afternoon.  Can fall asleep easily in the daytime, or in a car.     Family Hx: MOther died of ovarian and breast cancer at age 70.  Two sisters, no cancer history in them.  A maternal second cousin also had breast cancer.    Patient had mammography in Sep 16, 2010. Normal study, reviewed by me today.   Review of Systems     Objective:   Physical Exam Well appearing, no apparent distress.  Abd/Pelvic: Normal vaginal mucosa.  Cervix is clean and non-friable.  No discharge. Pap collected. Bimanual exam without adnexal masses, no CMT.       Assessment & Plan:

## 2011-03-02 ENCOUNTER — Encounter: Payer: Self-pay | Admitting: Family Medicine

## 2011-04-03 ENCOUNTER — Other Ambulatory Visit: Payer: Self-pay | Admitting: Family Medicine

## 2011-04-04 NOTE — Telephone Encounter (Signed)
Refill request

## 2011-05-10 ENCOUNTER — Encounter: Payer: Self-pay | Admitting: Family Medicine

## 2011-05-10 ENCOUNTER — Ambulatory Visit (INDEPENDENT_AMBULATORY_CARE_PROVIDER_SITE_OTHER): Payer: Self-pay | Admitting: Family Medicine

## 2011-05-10 DIAGNOSIS — S43429A Sprain of unspecified rotator cuff capsule, initial encounter: Secondary | ICD-10-CM

## 2011-05-10 DIAGNOSIS — B351 Tinea unguium: Secondary | ICD-10-CM | POA: Insufficient documentation

## 2011-05-10 DIAGNOSIS — F329 Major depressive disorder, single episode, unspecified: Secondary | ICD-10-CM

## 2011-05-10 DIAGNOSIS — M2010 Hallux valgus (acquired), unspecified foot: Secondary | ICD-10-CM

## 2011-05-10 DIAGNOSIS — M75101 Unspecified rotator cuff tear or rupture of right shoulder, not specified as traumatic: Secondary | ICD-10-CM

## 2011-05-10 DIAGNOSIS — M2012 Hallux valgus (acquired), left foot: Secondary | ICD-10-CM | POA: Insufficient documentation

## 2011-05-10 LAB — COMPREHENSIVE METABOLIC PANEL
ALT: 16 U/L (ref 0–35)
AST: 19 U/L (ref 0–37)
Albumin: 4.4 g/dL (ref 3.5–5.2)
Alkaline Phosphatase: 57 U/L (ref 39–117)
BUN: 16 mg/dL (ref 6–23)
CO2: 25 mEq/L (ref 19–32)
Calcium: 9.5 mg/dL (ref 8.4–10.5)
Chloride: 105 mEq/L (ref 96–112)
Creat: 0.57 mg/dL (ref 0.50–1.10)
Glucose, Bld: 98 mg/dL (ref 70–99)
Potassium: 4.6 mEq/L (ref 3.5–5.3)
Sodium: 141 mEq/L (ref 135–145)
Total Bilirubin: 0.3 mg/dL (ref 0.3–1.2)
Total Protein: 6.9 g/dL (ref 6.0–8.3)

## 2011-05-10 MED ORDER — KETOROLAC TROMETHAMINE 10 MG PO TABS: 10.0000 mg | ORAL_TABLET | Freq: Four times a day (QID) | ORAL | Status: DC | PRN

## 2011-05-10 NOTE — Progress Notes (Signed)
  Subjective:    Patient ID: Vicki [name], female    DOB: 1961/04/07, 50 y.o.   MRN: [id number]  HPI Vicki [name] is here for several issues:  1. L ear is popping when she blows her nose.  Feels full, can't get anything to comeout with her Q-tip.  Brownish discharge when she pulls the Q-tip out.  No pain, some decreased hearing acuity in the L ear.  No recent fevers or chills.  No complaints with R ear.   2. L great toe is deviating outward.  Medial aspect of bunion is painful. 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 No hand pain.  Limited to feet (L greater than R).   3. R shoulder continues to bother her.  Tramadol not helping.  Wants to try something else.  Tylenol doesn't help at all.    Review of Systems     Objective:   Physical Exam Alert, pleasant, no apparent distress.  HEENT Neck supple. L TM occluded by cerumen plug.   R TM clear with good cone of light. Clear oropharynx.  No cervical adenopathy. Thyroid supple.  COR RRR, no extra sounds PULM Clear bilaterally FEET: Discoloration of all toenails both feet; gnawed appearance.  Prominent bunion on L foot, no ulcerations noted.  Several callouses.  Palpable dp pulses bilaterally.        Assessment & Plan:

## 2011-05-10 NOTE — Assessment & Plan Note (Signed)
Emotionally feels stable.  Denies SI.  Anxiety is curbed by Wellbutrin (not taking the Buspar, but is taking the Wellbutrin per her report; did not bring her meds today).

## 2011-05-10 NOTE — Assessment & Plan Note (Signed)
Continues to present with pain. At the end of the visit she requests another shoulder injection.Last one done at [facility].  I will not be able to do this today; will consider after reviewing the timing since her last injection.  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

## 2011-05-10 NOTE — Assessment & Plan Note (Signed)
Bilateral bunions; will refer [facility] for consideration of custom orthotics for this problem.

## 2011-05-10 NOTE — Assessment & Plan Note (Signed)
All 10 toenails gnawed and discolored, consistent with onychomycosis.  FOr consideration lamisil systemic, once we check transaminases.  Patient does not drink alcohol.  Will contact her once CMet results are back.

## 2011-05-10 NOTE — Patient Instructions (Addendum)
It was a pleasure to see you today.  We are irrigating your left ear to get the wax buildup out.  I recommend using hydrogen peroxide at room temperature, in the left ear, at bedtime for the coming 1 week.  You may do this occasionally to prevent the formation of a wax buildup in the ear.  I would like you to call me if you are having more episodes of dizziness like what you had 3 weeks ago.   For your shoulder pain, I am prescribing you Toradol 10mg  tablets; take 1 tablet by mouth every 6 to 8 hours as needed for pain.  DO NOT TAKE WITH IBUPROFEN OR OTHER NSAIDS (ALEVE, MOTRIN, ETC).  I am checking your liver function tests today before we consider oral treatment for the nail fungus.   SPORTS MEDICINE FOR ORTHOTICS, FOR [facility]UX [facility]GUS.

## 2011-05-12 ENCOUNTER — Other Ambulatory Visit: Payer: Self-pay | Admitting: Family Medicine

## 2011-05-12 ENCOUNTER — Encounter: Payer: Self-pay | Admitting: Family Medicine

## 2011-05-12 MED ORDER — TERBINAFINE HCL 250 MG PO TABS: 250.0000 mg | ORAL_TABLET | Freq: Every day | ORAL | Status: DC

## 2011-05-13 ENCOUNTER — Other Ambulatory Visit: Payer: Self-pay | Admitting: Family Medicine

## 2011-05-13 NOTE — Telephone Encounter (Signed)
Wants to talk to Dr. Mauricio Po about his wifes prescriptions.  Buspar, Toradol and Ultram.  He has questions about how they work and what they are exactly.

## 2011-05-13 NOTE — Telephone Encounter (Signed)
Fwd. To Dr.Breen for advise? Vicki [name], Vicki [name]

## 2011-05-16 NOTE — Telephone Encounter (Signed)
LMOM advising husband to rt our call.

## 2011-05-16 NOTE — Telephone Encounter (Signed)
I spoke with the patient about this at her visit last Tuesday.  Here is a synopsis:  1. The Buspar is for nerves.  She did not bring her meds to the visit, therefore it was unclear if she is taking it.  I recommend that she continue taking this medication.   2. The Tramadol is for pain.  Vicki [name] was clear that this did not help her pain.  Therefore, I recommend she discontinue it.   3. The Toradol is also for pain.  I prescribed it at the most recent visit last week.  It should NOT be taking along with other anti-inflammatories (ie, no Advil, Motrin or other ibuprofen-containing meds; no Aleve or naproxen).  She should give this med a try for her pain complaints.  Kasten Leveque O

## 2011-05-18 NOTE — Telephone Encounter (Signed)
Pt's husband advised of medication recommendations. He voiced understanding.

## 2011-06-13 ENCOUNTER — Other Ambulatory Visit: Payer: Self-pay | Admitting: Family Medicine

## 2011-06-13 NOTE — Telephone Encounter (Signed)
Refill request

## 2011-06-20 ENCOUNTER — Encounter: Payer: Self-pay | Admitting: Family Medicine

## 2011-06-20 ENCOUNTER — Ambulatory Visit (INDEPENDENT_AMBULATORY_CARE_PROVIDER_SITE_OTHER): Payer: Self-pay | Admitting: Family Medicine

## 2011-06-20 VITALS — BP 151/87 | HR 80

## 2011-06-20 DIAGNOSIS — M75101 Unspecified rotator cuff tear or rupture of right shoulder, not specified as traumatic: Secondary | ICD-10-CM

## 2011-06-20 DIAGNOSIS — L98499 Non-pressure chronic ulcer of skin of other sites with unspecified severity: Secondary | ICD-10-CM

## 2011-06-20 DIAGNOSIS — M2010 Hallux valgus (acquired), unspecified foot: Secondary | ICD-10-CM

## 2011-06-20 DIAGNOSIS — M2012 Hallux valgus (acquired), left foot: Secondary | ICD-10-CM

## 2011-06-20 DIAGNOSIS — M21969 Unspecified acquired deformity of unspecified lower leg: Secondary | ICD-10-CM

## 2011-06-20 DIAGNOSIS — S43429A Sprain of unspecified rotator cuff capsule, initial encounter: Secondary | ICD-10-CM

## 2011-06-21 ENCOUNTER — Encounter: Payer: Self-pay | Admitting: Family Medicine

## 2011-06-21 DIAGNOSIS — L98499 Non-pressure chronic ulcer of skin of other sites with unspecified severity: Secondary | ICD-10-CM | POA: Insufficient documentation

## 2011-06-21 DIAGNOSIS — M21969 Unspecified acquired deformity of unspecified lower leg: Secondary | ICD-10-CM | POA: Insufficient documentation

## 2011-06-21 NOTE — Progress Notes (Signed)
  Subjective:    Patient ID: Vicki [name], female    DOB: 1961/05/11, 50 y.o.   MRN: [id number]  HPI  #1 followup right shoulder pain. Improved after corticosteroid injection initially. She continue his home exercise program but that seemed to make it worse so she stopped. Her pain is now back to baseline before first CSI. She is planning to stop work saying that things that will generally improve her shoulder. She would like to be evaluated for a second injection by says it did seem to help. Right now the pain is 10 out of 10 at night keeping her from sleeping well. She denies any weakness in the arm, denies numbness or tingling in the hand.  #2. Bilateral foot pain. Particularly on the left foot. Soles of both feet are constantly painful if she has problems with callus for many years. She stands a lot. In the last several months she's noticed left medial foot pain, especially after long periods of standing. Numb, burning and aching pain. Radiates to bottom of her foot.  Review of Systems Pertinent review of systems: negative for fever or unusual weight change.     Objective:   Physical Exam  Vital signs reviewed. GENERAL: Well developed, well nourished, no acute distress FEET: medial foot collapse B but worse on left foot . B soles of feet have multiple areas of thick callous, none are fissuring. B bunions with pes planus foot deformity.  SHOULDER: FROm with pain in supraspinatus testing. Positive impingement signs. Distally neurovascualrly intact.  INJECTION: Patient was given informed consent, signed copy in the chart. Appropriate time out was taken. Area prepped and draped in usual sterile fashion. 1 cc of methylprednisolone 40 mg/ml plus  4 cc of 1% lidocaine without epinephrine was injected into the right subacromial bursa using a(n)  posterior approach. The patient tolerated the procedure well. There were no complications. Post procedure instructions were given.      Assessment &  Plan:  1. Subacromial bursitis / rotator cuff syndrome right. CSI---this is her second injection. I do not recommend more  In the near future. I would restart the HEP if possible. If no improvement she might consider surgical intervention were hat to become available to her 2. B medial foot collapse and callous formation. She really needs custom molded orthotics to prevent further foot collapse. rtc for orthotics

## 2011-06-27 ENCOUNTER — Encounter: Payer: Self-pay | Admitting: Family Medicine

## 2011-07-07 ENCOUNTER — Encounter: Payer: Self-pay | Admitting: Family Medicine

## 2011-07-14 ENCOUNTER — Encounter: Payer: Self-pay | Admitting: Family Medicine

## 2011-07-22 ENCOUNTER — Ambulatory Visit: Payer: Self-pay | Admitting: Family Medicine

## 2011-07-28 ENCOUNTER — Ambulatory Visit (INDEPENDENT_AMBULATORY_CARE_PROVIDER_SITE_OTHER): Payer: Self-pay | Admitting: Family Medicine

## 2011-07-28 ENCOUNTER — Encounter: Payer: Self-pay | Admitting: Family Medicine

## 2011-07-28 DIAGNOSIS — M2012 Hallux valgus (acquired), left foot: Secondary | ICD-10-CM

## 2011-07-28 DIAGNOSIS — M7740 Metatarsalgia, unspecified foot: Secondary | ICD-10-CM

## 2011-07-28 DIAGNOSIS — L98499 Non-pressure chronic ulcer of skin of other sites with unspecified severity: Secondary | ICD-10-CM

## 2011-07-28 DIAGNOSIS — M79673 Pain in unspecified foot: Secondary | ICD-10-CM

## 2011-07-28 DIAGNOSIS — M21969 Unspecified acquired deformity of unspecified lower leg: Secondary | ICD-10-CM

## 2011-07-28 DIAGNOSIS — M2010 Hallux valgus (acquired), unspecified foot: Secondary | ICD-10-CM

## 2011-07-28 DIAGNOSIS — M7750 Other enthesopathy of unspecified foot: Secondary | ICD-10-CM

## 2011-07-28 DIAGNOSIS — M79609 Pain in unspecified limb: Secondary | ICD-10-CM

## 2011-07-29 NOTE — Progress Notes (Signed)
  Patient Name: Vicki [name] Date of Birth: 08-16-1960 Age: 50 y.o. Medical Record Number: [id number] Gender: female  History of Present Illness:  Vicki [name] is a 50 y.o. very pleasant female patient who presents with the following:  F/u with B forefoot pain, L > R with metatarsalgia and significant callus formation causing the patient pain. She is on her feet much of the work day with signficant discomfort. Current shoes are not comfortable to her.   Past Medical History, Surgical History, Social History, Family History, and Problem List have been reviewed in EHR and updated if relevant.  Review of Systems:  GEN: No fevers, chills. Nontoxic. Primarily MSK c/o today. MSK: Detailed in the HPI GI: tolerating PO intake without difficulty Neuro: No numbness, parasthesias, or tingling associated. Otherwise the pertinent positives of the ROS are noted above.    Physical Examination: Filed Vitals:   07/28/11 1404  BP: 128/88  Pulse: 94  Height: 5\' 7"  (1.702 m)  Weight: 150 lb (68.04 kg)    Body mass index is 23.49 kg/(m^2).   GEN: WDWN, NAD, Non-toxic, Alert & Oriented x 3 HEENT: Atraumatic, Normocephalic.  Ears and Nose: No external deformity. EXTR: No clubbing/cyanosis/edema NEURO: Normal gait.  PSYCH: Normally interactive. Conversant. Not depressed or anxious appearing.  Calm demeanor.   FEET: b Echymosis: no Edema: no ROM: full LE B Gait: heel toe, non-antalgic MT pain: no Callus pattern: extensive metatarsal Lateral Mall: NT Medial Mall: NT Talus: NT Navicular: NT Cuboid: NT Calcaneous: NT Metatarsals: squeeze ttp 5th MT: NT Phalanges: NT Achilles: NT Plantar Fascia: NT Fat Pad: NT Peroneals: NT Post Tib: NT Great Toe: Nml motion Ant Drawer: neg ATFL: NT CFL: NT Deltoid: NT Other foot breakdown: none Long arch: dropped arches Transverse arch: dropped mt heads, bunion formation Hindfoot breakdown: none Sensation: intact   Assessment and  Plan: 1. Foot deformity, acquired   2. Callous ulcer   3. Foot pain   4. Metatarsalgia     >25 minutes spent in face to face time with patient, >50% spent in counselling or coordination of care: reviewing foot care and anatomy, ways for comfort  Patient was fitted for a standard, cushioned, semi-rigid orthotic.  The orthotic was heated and the patient stood on the orthotic blank positioned on the orthotic stand. The patient was positioned in subtalar neutral position and 10 degrees of ankle dorsiflexion in a weight bearing stance. After molding, a stable [employer] EVA base was applied to the orthotic blank.  Silver-threaded black blank. The blank was ground to a stable position for weight bearing. size:8 base: [employer]: none additional orthotic padding: none

## 2011-08-19 ENCOUNTER — Encounter: Payer: Self-pay | Admitting: Family Medicine

## 2011-08-19 ENCOUNTER — Ambulatory Visit (INDEPENDENT_AMBULATORY_CARE_PROVIDER_SITE_OTHER): Payer: Self-pay | Admitting: Family Medicine

## 2011-08-19 VITALS — BP 124/77 | HR 79 | Ht 67.0 in | Wt 152.0 lb

## 2011-08-19 DIAGNOSIS — F17200 Nicotine dependence, unspecified, uncomplicated: Secondary | ICD-10-CM

## 2011-08-19 DIAGNOSIS — B351 Tinea unguium: Secondary | ICD-10-CM

## 2011-08-19 DIAGNOSIS — M25519 Pain in unspecified shoulder: Secondary | ICD-10-CM

## 2011-08-19 DIAGNOSIS — F329 Major depressive disorder, single episode, unspecified: Secondary | ICD-10-CM

## 2011-08-19 LAB — COMPREHENSIVE METABOLIC PANEL
AST: 43 U/L — ABNORMAL HIGH (ref 0–37)
Alkaline Phosphatase: 134 U/L — ABNORMAL HIGH (ref 39–117)
BUN: 6 mg/dL (ref 6–23)
Calcium: 8.5 mg/dL (ref 8.4–10.5)
Chloride: 103 mEq/L (ref 96–112)
Creat: 0.78 mg/dL (ref 0.50–1.10)
Total Bilirubin: 1.8 mg/dL — ABNORMAL HIGH (ref 0.3–1.2)

## 2011-08-19 MED ORDER — KETOROLAC TROMETHAMINE 10 MG PO TABS: 10.0000 mg | ORAL_TABLET | Freq: Every day | ORAL | Status: DC | PRN

## 2011-08-19 MED ORDER — PAROXETINE HCL 10 MG PO TABS: 10.0000 mg | ORAL_TABLET | ORAL | Status: DC

## 2011-08-19 MED ORDER — MELOXICAM 7.5 MG PO TABS: 7.5000 mg | ORAL_TABLET | Freq: Every day | ORAL | Status: DC

## 2011-08-19 NOTE — Patient Instructions (Signed)
It was a pleasure to see you today.  I am checking your liver function today.   I sent a refill of the pain medication (ketorolac) to the [facility] pharmacy in [place].  I sent a new prescription for the anxiety (Paroxetine 10mg , take 1 tab every morning) to the [facility] pharmacy.  I would like to see you back in 2 to 4 weeks to see how you are doing.  Call me if the side effects of the Paroxetine cause you to have to stop it.

## 2011-08-19 NOTE — Assessment & Plan Note (Signed)
Continues with anxiety.  Did not do well with Wellbutrin, which I had hoped would curb her smoking cravings (did not work).  Never tried SSRI.  Will try citalopram 10mg  daily, discussed possible side effects of this.  Follow up in 2 to 4 weeks.

## 2011-08-19 NOTE — Assessment & Plan Note (Signed)
Counseled on quitting, she is mildly interested.  Agrees to accompany husband (Medicare recipient) to PharmD clinic consult for his smoking cessation; she will likely benefit from this.

## 2011-08-19 NOTE — Progress Notes (Signed)
  Subjective:    Patient ID: Vicki [name], female    DOB: [birth year], 51 y.o.   MRN: [id number]  HPI Seen today for followup.  She had another R shoulder injection with Dr Jennette Kettle in Sports Medicine on 06/20/11, with minimal improvement.  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  Starting to affect her L shoulder.  Uses ketorolac which has been the only thing that has been helpful.   Reports continued anxiety and palpitations; the Buspar 10mg  twice daily is helpful, but continues to feel anxious.  Denies outright depression "if my pain is well controlled".  Still smoking, somewhat interested in quitting.  Has smoked for 40 years.  Husband also smokes, he is interested in quitting (here today).   Poor sleep secondary to anxiety and shoulder pain.   Review of Systems Denies chest pain or dyspnea.      Objective:   Physical Exam Alert, well appearing, no apparent distress. HEENT Neck supple.        Assessment & Plan:

## 2011-08-19 NOTE — Assessment & Plan Note (Signed)
Continues, has failed series of injections.  No further injections recommended per [facility] consult.  Ketorolac has been helpful.  To check CMet today, will switch to another NSAID (Mobic 7.5mg  daily, may increase to 15mg  daily if needed).

## 2011-08-22 ENCOUNTER — Telehealth: Payer: Self-pay | Admitting: Family Medicine

## 2011-08-22 NOTE — Telephone Encounter (Signed)
Called patient and discussed lab values.  She has just now started the Mobic (first dose today).  Stressed importance of not taking ketorolac (discontinued from med list at last visit).  Consider recheck of transaminases and alkaline phosphatase at next visit.  She is starting Paxil today as well; plans to follow up in 2 to 4 weeks for this.   Paula Compton, M.D.

## 2011-09-07 ENCOUNTER — Other Ambulatory Visit: Payer: Self-pay | Admitting: Family Medicine

## 2011-09-07 NOTE — Telephone Encounter (Signed)
I called patient at home phone, she states that Meloxicam is helping her shoulder pain; works best when she takes 15mg  daily.  Will increase number dispensed.  She is going to [employer] to get [employer] straightened out and to see me in the next two weeks.  Feels the Paxil is also helping her feel better.  Paula Compton, MD

## 2011-09-07 NOTE — Telephone Encounter (Signed)
Refill request

## 2011-10-07 ENCOUNTER — Other Ambulatory Visit: Payer: Self-pay | Admitting: Family Medicine

## 2011-11-07 ENCOUNTER — Other Ambulatory Visit: Payer: Self-pay | Admitting: Family Medicine

## 2011-11-07 NOTE — Telephone Encounter (Signed)
Pt to schedule appt with Dr. Mauricio Po.  Will fill rx for 30 days.

## 2011-11-29 ENCOUNTER — Ambulatory Visit (INDEPENDENT_AMBULATORY_CARE_PROVIDER_SITE_OTHER): Payer: Self-pay | Admitting: Family Medicine

## 2011-11-29 ENCOUNTER — Encounter: Payer: Self-pay | Admitting: Family Medicine

## 2011-11-29 VITALS — BP 126/85 | HR 82 | Ht 67.0 in | Wt 149.9 lb

## 2011-11-29 DIAGNOSIS — M75101 Unspecified rotator cuff tear or rupture of right shoulder, not specified as traumatic: Secondary | ICD-10-CM

## 2011-11-29 DIAGNOSIS — S43429A Sprain of unspecified rotator cuff capsule, initial encounter: Secondary | ICD-10-CM

## 2011-11-29 DIAGNOSIS — F329 Major depressive disorder, single episode, unspecified: Secondary | ICD-10-CM

## 2011-11-29 DIAGNOSIS — M25519 Pain in unspecified shoulder: Secondary | ICD-10-CM

## 2011-11-29 MED ORDER — PAROXETINE HCL 40 MG PO TABS: 20.0000 mg | ORAL_TABLET | ORAL | Status: DC

## 2011-11-29 MED ORDER — KETOROLAC TROMETHAMINE 30 MG/ML IJ SOLN
30.0000 mg | Freq: Once | INTRAMUSCULAR | Status: AC
  Administered 2011-11-29: 30 mg via INTRAMUSCULAR
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
## 2011-11-29 NOTE — Assessment & Plan Note (Signed)
Patient with continued R shoulder pain, worsening as her work demands perpetuate re-injury.  Mobic not as helpful.  She is applying for disability again; was rejected in the past.  She has seen Sports Medicine in the past, was recommended not to get further steroid injections (maxed out).  I am referring her back to [facility] for re-evaluation, as she is still unable to get surgical eval or intervention.  I don't know if she might be a candidate for another injection, as it has been over 6 months since her last one.  Today will give her Toradol IM x1; she has work this afternoon (was called in as an emergency coverage for absent co-worker).

## 2011-11-29 NOTE — Assessment & Plan Note (Signed)
Worsening depression, aggravating factors at home that are exacerbating greatly.  She is amenable to increasing her dose of Paxil (presently at low dose) with plan to continue escalating until either side effects or max dose reached.  I do not have active concerns for bipolar disorder or patient safety from self-harm point of view.  I have recommended counseling, as CBT may be particularly useful to give her tools to deal with her grandchildren, as well as her husband who has his own medical issues that she must contend with.  She is concerned that counseling means I think she's "Crazy", has passed on going to therapy in the past (notably in 01/12/2005 when her mother died).  Reassurance given about the goals of counseling as a way for her to learn new coping skills and thus feel better.  She agrees to call Dr. Pascal Lux to schedule.  Follow up with me in the coming 2 to 4 weeks.

## 2011-11-29 NOTE — Patient Instructions (Signed)
It was a pleasure to see you again today.    I am recommending an increase in the paroxetine from 10mg  to 20mg  daily.  I sent a new prescription for paroxetine 40mg  tablets; please take ONE-HALF TABLET one time daily.  If you still have the 10mg  tablets, you may take 2 of them daily until they run out.   Keep taking the Buspirone 10mg  twice daily as you have been doing.   I ask that you call our Clinical Psychologist, Dr. Spero Geralds, for an appointment with her.    I also ask that you make a follow-up appointment with the [facility] for the right shoulder.  I will write a letter for your application for disability.   I would like to see you back in the coming 2 to 4 weeks.

## 2011-12-08 ENCOUNTER — Other Ambulatory Visit: Payer: Self-pay | Admitting: Family Medicine

## 2011-12-08 ENCOUNTER — Telehealth: Payer: Self-pay | Admitting: Psychology

## 2011-12-08 NOTE — Telephone Encounter (Signed)
Vicki [name] called to request a [facility] med appointment.  I reviewed Dr. Marinell Blight notes.  Vicki [name] reported she is supposed to get the orange card tomorrow.  My beh med clinic is full right now so I provided her with a referral to [facility] [phone]).  I did schedule her for May 28th at 4:00 in case [facility] couldn't get her in any sooner or if it was cost prohibitive.  She said she would call to cancel this appointment if [facility] proved to be a good match.

## 2011-12-12 ENCOUNTER — Other Ambulatory Visit: Payer: Self-pay | Admitting: Family Medicine

## 2011-12-12 MED ORDER — BUSPIRONE HCL 10 MG PO TABS: 10.0000 mg | ORAL_TABLET | Freq: Two times a day (BID) | ORAL | Status: DC

## 2012-01-03 ENCOUNTER — Ambulatory Visit (INDEPENDENT_AMBULATORY_CARE_PROVIDER_SITE_OTHER): Payer: Self-pay | Admitting: Psychology

## 2012-01-03 DIAGNOSIS — F3289 Other specified depressive episodes: Secondary | ICD-10-CM

## 2012-01-03 DIAGNOSIS — F329 Major depressive disorder, single episode, unspecified: Secondary | ICD-10-CM

## 2012-01-04 ENCOUNTER — Encounter: Payer: Self-pay | Admitting: Psychology
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
## 2012-01-23 ENCOUNTER — Ambulatory Visit (INDEPENDENT_AMBULATORY_CARE_PROVIDER_SITE_OTHER): Payer: Self-pay | Admitting: Psychology

## 2012-01-23 DIAGNOSIS — F329 Major depressive disorder, single episode, unspecified: Secondary | ICD-10-CM

## 2012-01-23 NOTE — Progress Notes (Signed)
Vicki [name] presents for follow-up.  She notes depressed mood and feeling stressed.  Called off work yesterday at the urging of Vicki [name] to take him to the park with the family for father's day.  She was miserable.  Family dynamics are overwhelming to her.  She thinks Vicki [name]'s health is exceptionally poor and he refusing anything that might be helpful.  Kids stay home with him while she works on the weekend.  Vicki [name] (youngest son) had to come over to get the kids the other day because Vicki [name] couldn't manage it.    Vicki [name] is trying to get disability for her shoulder and "stress."
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
## 2012-01-31 ENCOUNTER — Ambulatory Visit: Payer: Self-pay | Admitting: Family Medicine

## 2012-02-13 ENCOUNTER — Ambulatory Visit: Payer: Self-pay | Admitting: Psychology

## 2012-02-14 ENCOUNTER — Encounter: Payer: Self-pay | Admitting: Family Medicine

## 2012-02-14 ENCOUNTER — Other Ambulatory Visit: Payer: Self-pay | Admitting: *Deleted

## 2012-02-14 ENCOUNTER — Ambulatory Visit (INDEPENDENT_AMBULATORY_CARE_PROVIDER_SITE_OTHER): Payer: Self-pay | Admitting: Family Medicine

## 2012-02-14 VITALS — BP 113/74 | HR 78 | Temp 97.6°F | Ht 67.0 in | Wt 149.0 lb

## 2012-02-14 DIAGNOSIS — F329 Major depressive disorder, single episode, unspecified: Secondary | ICD-10-CM

## 2012-02-14 DIAGNOSIS — M75101 Unspecified rotator cuff tear or rupture of right shoulder, not specified as traumatic: Secondary | ICD-10-CM

## 2012-02-14 DIAGNOSIS — F3289 Other specified depressive episodes: Secondary | ICD-10-CM

## 2012-02-14 DIAGNOSIS — S43429A Sprain of unspecified rotator cuff capsule, initial encounter: Secondary | ICD-10-CM

## 2012-02-14 MED ORDER — PAROXETINE HCL 10 MG PO TABS: 10.0000 mg | ORAL_TABLET | ORAL | Status: DC

## 2012-02-14 MED ORDER — MELOXICAM 7.5 MG PO TABS: ORAL_TABLET | ORAL | Status: DC

## 2012-02-14 NOTE — Telephone Encounter (Addendum)
Patient calls requesting refill on Mobic. States Dr. Mauricio Po was suppose to send in this AM and didn't. Will send to preceptor.

## 2012-02-14 NOTE — Telephone Encounter (Signed)
Consulted with Dr.  Jennette Kettle and she advises OK to send in. Sent electronically.

## 2012-02-14 NOTE — Patient Instructions (Addendum)
It was a pleasure to see you today.   For the pain, I recommend you take either the meloxicam (Mobic) OR advil (ibuprofen), but not both.   You may take acetaminophen regularly for the pain to keep the pain level down.   I would be glad to furnish more information to your disability lawyer with completion of proper release-of-information forms.   I am reducing the dose of the Paxil to 10mg  daily (down from 20mg  daily).   I would like to see you back in another 6 to 8 weeks, or sooner if needed.   I ask that you call Dr Pascal Lux back to reschedule your visit with her.

## 2012-02-15 NOTE — Assessment & Plan Note (Signed)
Continues with depression. She has been seeing Dr Pascal Lux, missed last appt ("I just forgot") but has already made another appt for later this month.  Asks that I decrease her dose, concern she is becoming too sleepy with the med.

## 2012-02-15 NOTE — Progress Notes (Signed)
  Subjective:    Patient ID: Vicki [name], female    DOB: 10/29/1960, 51 y.o.   MRN: [id number]  HPI Here today for follow up. Still with significant pain in the right arm (hx rotator cuff injury).  The Toradol shot given at last visit to me in April was not very helpful, even for short-term pain relief.  She has gone through PT without much relief.  [facility] injections ([facility] and here) have been short-lived.  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   Finds that the mobic helps with pain, but she reports feeling sleepy when she takes it.  Tramadol has not been helpful in the past.  Tylenol does not help much either.  Reports that she has daycare for the grandkids, which is helpful.  The Paxil is "too strong", is taking half a 20mg  tablet once daily.  Asks that I scale it back to 10mg  daily.  No thoughts of harming herself.   Review of Systems Reports sleep issues; goes to sleep at 11pm, gets up at 7am.      Objective:   Physical Exam Alert, no apparent distress.  HEENT Neck supple. Some tenderness with palpation R side SCM down to R anterior shoulder.  Unable to lift R arm to horizontal position. Handgrip full and symmetric.        Assessment & Plan:

## 2012-02-15 NOTE — Assessment & Plan Note (Signed)
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  I had written a detailed letter regarding the state of her medical condition, which she has shared with her disability lawyer.  She is upset that I stated that I had written the letter at her request, saying this makes the content of the letter suspect with regard to its veracity.  I explained that I would be happy to furnish any medical information to whomever she wishes, provided she fill out our written ROI consent form.  Furthermore, the fact that she asked that I write on her behalf does not change the facts of her medical conditions, nor my assessment/prognosis about her ability to perform her work duties.  [facility] for consideration of other modalities for R shoulder pain. She has not derived much benefit from physical therapy, steroid injections, tramadol, acetaminophen, or NSAIDs.  Social situation coupled with patient's poorly controlled depression/anxiety make me reticent to consider long-term opioid therapy.

## 2012-02-21 ENCOUNTER — Encounter: Payer: Self-pay | Admitting: Physical Medicine & Rehabilitation

## 2012-02-28 ENCOUNTER — Ambulatory Visit (INDEPENDENT_AMBULATORY_CARE_PROVIDER_SITE_OTHER): Payer: Self-pay | Admitting: Psychology

## 2012-02-28 DIAGNOSIS — F329 Major depressive disorder, single episode, unspecified: Secondary | ICD-10-CM
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
## 2012-02-28 NOTE — Assessment & Plan Note (Addendum)
Mood is reported as depressed.  I think the biggest thing that would help her mental health is changes in her social environment.  These are not likely going to happen anytime soon.  Mentioned in an early note whether a medicine for sleep might be helpful as that is what she uses the Klonopin for mainly (she gets this from a family member).  I think she has been tried on one in the past.  Will reassess her thoughts on this next visit.    Vicki [name] elected to follow-up stating that it was helpful to have someone to share her stress.  Scheduled for August 15th at 2:00.

## 2012-03-07 ENCOUNTER — Telehealth: Payer: Self-pay | Admitting: Physical Medicine & Rehabilitation

## 2012-03-07 ENCOUNTER — Encounter: Payer: Self-pay | Admitting: Physical Medicine & Rehabilitation

## 2012-03-07 ENCOUNTER — Encounter: Payer: Self-pay | Attending: Physical Medicine & Rehabilitation | Admitting: Physical Medicine & Rehabilitation

## 2012-03-07 VITALS — BP 144/82 | HR 79 | Resp 14 | Ht 63.0 in | Wt 152.0 lb

## 2012-03-07 DIAGNOSIS — F341 Dysthymic disorder: Secondary | ICD-10-CM | POA: Insufficient documentation

## 2012-03-07 DIAGNOSIS — S43429A Sprain of unspecified rotator cuff capsule, initial encounter: Secondary | ICD-10-CM

## 2012-03-07 DIAGNOSIS — M25519 Pain in unspecified shoulder: Secondary | ICD-10-CM | POA: Insufficient documentation

## 2012-03-07 DIAGNOSIS — F17200 Nicotine dependence, unspecified, uncomplicated: Secondary | ICD-10-CM | POA: Insufficient documentation

## 2012-03-07 DIAGNOSIS — M75102 Unspecified rotator cuff tear or rupture of left shoulder, not specified as traumatic: Secondary | ICD-10-CM

## 2012-03-07 DIAGNOSIS — F418 Other specified anxiety disorders: Secondary | ICD-10-CM | POA: Insufficient documentation

## 2012-03-07 DIAGNOSIS — M67919 Unspecified disorder of synovium and tendon, unspecified shoulder: Secondary | ICD-10-CM | POA: Insufficient documentation

## 2012-03-07 DIAGNOSIS — G479 Sleep disorder, unspecified: Secondary | ICD-10-CM

## 2012-03-07 DIAGNOSIS — IMO0001 Reserved for inherently not codable concepts without codable children: Secondary | ICD-10-CM | POA: Insufficient documentation

## 2012-03-07 DIAGNOSIS — M75101 Unspecified rotator cuff tear or rupture of right shoulder, not specified as traumatic: Secondary | ICD-10-CM

## 2012-03-07 DIAGNOSIS — F329 Major depressive disorder, single episode, unspecified: Secondary | ICD-10-CM

## 2012-03-07 DIAGNOSIS — M719 Bursopathy, unspecified: Secondary | ICD-10-CM | POA: Insufficient documentation

## 2012-03-07 MED ORDER — TRAMADOL HCL 50 MG PO TABS: 50.0000 mg | ORAL_TABLET | Freq: Every evening | ORAL | Status: AC | PRN

## 2012-03-07 NOTE — Progress Notes (Signed)
Subjective:    Patient ID: Vicki [name], female    DOB: 1960/08/22, 51 y.o.   MRN: [id number]  HPI  This is a 51 year old female with depression who noticed problems with her right shoulder in 2006 which has gradually increased. She reports no particular incident which triggered the symptoms, but more that it has been a cumulative result of the work she does as a Financial trader. Due to overcompensating the left shoulder has begun to hurt over the last year. The more she works, the more her arms hurt.  If she remains still, her shoulders don't bother her too much. Even cooking or simple house hold task increase the pain.   She has had 3 shoulder injections. Usually they have lasted for about a week and then the pain starts back.  She had an MRI in March of 2012 which revealed:  1. Full-thickness retracted supraspinatus tendon tear with  associated muscular atrophy.  2. At least high-grade partial tearing of the distal infraspinatus  tendon, more likely to represent a full-thickness nonretracted  tear, also with associated muscular atrophy.  3. Intact labrum and biceps tendon.  4. No acute osseous findings.  She chose not to pursue surgery at the time of this MRI  She currently uses mobic 7.5mg  BID which provides some relief. She also uses an occasional tylenol but doesn't like to mix it with the mobic. She may use the tylenol once or twice a day at most.   Therapy was prescribed but she had to stop due to increased pain. She does do occasional stretches at home but is not routine with these. 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 The more she works, and more severe her pain is. She has noticed that when she backs off work her pain improved substantially. Stress at home and depression also increase her pain levels.     Pain Inventory Average Pain 8 Pain Right Now 8 My pain is aching  In the last 24 hours, has pain interfered with the following? General  activity 9 Relation with others 9 Enjoyment of life 9 What TIME of day is your pain at its worst? all the time Sleep (in general) Poor she tends to sleep on her side.  Pain is worse with: walking, bending, sitting, inactivity and standing Pain improves with: medication Relief from Meds: 5  Mobility how many minutes can you walk? none ability to climb steps?  yes do you drive?  yes Do you have any goals in this area?  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  no   Family History  Problem Relation Age of Onset  . Heart disease Mother   . Diabetes Mother   . Cancer Mother   . Heart disease Father    History   Social History  . Marital Status: Married    Spouse Name: N/A    Number of Children: N/A  . Years of Education: N/A   Social History Main Topics  . Smoking status: Current Everyday Smoker -- 1.8 packs/day    Types: Cigarettes  . Smokeless tobacco: Never Used  . Alcohol Use: None  . Drug Use: None  . Sexually Active: None   Other Topics Concern  . None   Social History Narrative  . None  Past Surgical History  Procedure Date  . Tubal ligation    History reviewed. No pertinent past medical history. BP 144/82  Pulse 79  Resp 14  Ht 5\' 3"  (1.6 m)  Wt 152 lb (68.947 kg)  BMI 26.93 kg/m2  SpO2 97%     Review of Systems  Constitutional: Positive for fever, chills, diaphoresis and unexpected weight change.  Respiratory: Positive for apnea, cough, shortness of breath and wheezing.   Gastrointestinal: Positive for constipation.  Musculoskeletal: Positive for myalgias, back pain, arthralgias and gait problem.  Neurological: Positive for dizziness and numbness.  Psychiatric/Behavioral: Positive for confusion and dysphoric mood. The patient is  nervous/anxious.   All other systems reviewed and are negative.       Objective:   Physical Exam   General: Alert and oriented x 3, No apparent distress HEENT: Head is normocephalic, atraumatic, PERRLA, EOMI, sclera anicteric, oral mucosa pink and moist, dentition intact, ext ear canals clear,  Neck: Supple without JVD or lymphadenopathy Heart: Reg rate and rhythm. No murmurs rubs or gallops Chest: CTA bilaterally without wheezes, rales, or rhonchi; no distress Abdomen: Soft, non-tender, non-distended, bowel sounds positive. Extremities: No clubbing, cyanosis, or edema. Pulses are 2+ Skin: Clean and intact without signs of breakdown Neuro: Pt is cognitively appropriate with normal insight, memory, and awareness. Cranial nerves 2-12 are intact. Sensory exam is normal. Reflexes are 2+ in all 4's. Fine motor coordination is intact. No tremors. Motor function is grossly 5/5 less that which was affected by pain. Standing posture was fair. She made without difficulty. Musculoskeletal: Right shoulder with significant pain upon palpation particularly in the acromial region. She has pain in the subdeltoid area as well. She had difficulty lifting the arm in abduction for the first 20-30. She tends to compensate with her shoulder to lift the arm. Internal and external rotation of the shoulder causes significant pain. She does have protraction and elevation of the scapula with tight trapezius muscle noted also. Trapezius was tender. Left rotator cuff was also tender with provocative maneuvers but she did have full range of motion there. Strength is generally near 5 out of 5 except for pain inhibition at the shoulders.Marland Kitchen Psych: Pt's affect is appropriate. Pt is cooperative        Assessment & Plan:  1. Right rotator cuff syndrome: with complete supraspinatus tear, partial infraspinatus tear.  2. 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 Myofascial pain of the right shoulder girdle 4.  Depression with anxiety 5. Tobacco abuse  Plan: 1. Discussed appropriate posture and adaptive techniques at work. 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 Also, as long as she continues to work in this area she will likely end up with increased left sided symptoms and increased myofascial pain. Unfortunately, she is not a surgical candidate either at this point. 2. She may continue with her meloxicam 7.5 mg twice a day with food. I advised her that she can take up to 2000 mg safely of Tylenol each day. She may take the Tylenol with her meloxicam. Emphasized the importance of ice and heat as well for muscle pain in particular. 3. Prescribed tramadol 50 mg each bedtime when necessary to assist with sleep and nighttime pain. We did discuss sleeping posture as well. She needs to try to focus on more supine sleeping but in side-lying. 4. Patient was provided rotator cuff information and exercises today. She needs to work on relaxing her scapular  stabilizing muscles. She should be performing exercises only against gravity. She should not be using any resistance. 5. Discussed the impact of anxiety and depression on her pain is well. 6. Discussed smoking cessation 7. I will see the patient back in about 2 months time. All questions were encouraged and answered.

## 2012-03-07 NOTE — Addendum Note (Signed)
Addended by: Caryl Ada on: 03/07/2012 01:16 PM   Modules accepted: Orders

## 2012-03-07 NOTE — Patient Instructions (Addendum)
YOU MAY USE UP 2000 MG OF TYLENOL EACH DAY. YOU MAY TAKE IT WITY YOUR MELOXICAM   DO NOT PERFORM ANY OF THESE EXERCISES AGAINST RESISTANCE   Impingement Syndrome, Rotator Cuff, Bursitis with Rehab Impingement syndrome is a condition that involves inflammation of the tendons of the rotator cuff and the subacromial bursa, that causes pain in the shoulder. The rotator cuff consists of four tendons and muscles that control much of the shoulder and upper arm function. The subacromial bursa is a fluid filled sac that helps reduce friction between the rotator cuff and one of the bones of the shoulder (acromion). Impingement syndrome is usually an overuse injury that causes swelling of the bursa (bursitis), swelling of the tendon (tendonitis), and/or a tear of the tendon (strain). Strains are classified into three categories. Grade 1 strains cause pain, but the tendon is not lengthened. Grade 2 strains include a lengthened ligament, due to the ligament being stretched or partially ruptured. With grade 2 strains there is still function, although the function may be decreased. Grade 3 strains include a complete tear of the tendon or muscle, and function is usually impaired. SYMPTOMS   Pain around the shoulder, often at the outer portion of the upper arm.   Pain that gets worse with shoulder function, especially when reaching overhead or lifting.   Sometimes, aching when not using the arm.   Pain that wakes you up at night.   Sometimes, tenderness, swelling, warmth, or redness over the affected area.   Loss of strength.   Limited motion of the shoulder, especially reaching behind the back (to the back pocket or to unhook bra) or across your body.   Crackling sound (crepitation) when moving the arm.   Biceps tendon pain and inflammation (in the front of the shoulder). Worse when bending the elbow or lifting.  CAUSES  Impingement syndrome is often an overuse injury, in which chronic (repetitive)  motions cause the tendons or bursa to become inflamed. A strain occurs when a force is paced on the tendon or muscle that is greater than it can withstand. Common mechanisms of injury include: Stress from sudden increase in duration, frequency, or intensity of training.  Direct hit (trauma) to the shoulder.   Aging, erosion of the tendon with normal use.   Bony bump on shoulder (acromial spur).  RISK INCREASES WITH:  Contact sports (football, wrestling, boxing).   Throwing sports (baseball, tennis, volleyball).   Weightlifting and bodybuilding.   Heavy labor.   Previous injury to the rotator cuff, including impingement.   Poor shoulder strength and flexibility.   Failure to warm up properly before activity.   Inadequate protective equipment.   Old age.   Bony bump on shoulder (acromial spur).  PREVENTION   Warm up and stretch properly before activity.   Allow for adequate recovery between workouts.   Maintain physical fitness:   Strength, flexibility, and endurance.   Cardiovascular fitness.   Learn and use proper exercise technique.  PROGNOSIS  If treated properly, impingement syndrome usually goes away within 6 weeks. Sometimes surgery is required.  RELATED COMPLICATIONS   Longer healing time if not properly treated, or if not given enough time to heal.   Recurring symptoms, that result in a chronic condition.   Shoulder stiffness, frozen shoulder, or loss of motion.   Rotator cuff tendon tear.   Recurring symptoms, especially if activity is resumed too soon, with overuse, with a direct blow, or when using poor technique.  TREATMENT  Treatment first involves the use of ice and medicine, to reduce pain and inflammation. The use of strengthening and stretching exercises may help reduce pain with activity. These exercises may be performed at home or with a therapist. If non-surgical treatment is unsuccessful after more than 6 months, surgery may be advised.  After surgery and rehabilitation, activity is usually possible in 3 months.  MEDICATION  If pain medicine is needed, nonsteroidal anti-inflammatory medicines (aspirin and ibuprofen), or other minor pain relievers (acetaminophen), are often advised.   Do not take pain medicine for 7 days before surgery.   Prescription pain relievers may be given, if your caregiver thinks they are needed. Use only as directed and only as much as you need.   Corticosteroid injections may be given by your caregiver. These injections should be reserved for the most serious cases, because they may only be given a certain number of times.  HEAT AND COLD  Cold treatment (icing) should be applied for 10 to 15 minutes every 2 to 3 hours for inflammation and pain, and immediately after activity that aggravates your symptoms. Use ice packs or an ice massage.   Heat treatment may be used before performing stretching and strengthening activities prescribed by your caregiver, physical therapist, or athletic trainer. Use a heat pack or a warm water soak.  SEEK MEDICAL CARE IF:   Symptoms get worse or do not improve in 4 to 6 weeks, despite treatment.   New, unexplained symptoms develop. (Drugs used in treatment may produce side effects.)  EXERCISES  RANGE OF MOTION (ROM) AND STRETCHING EXERCISES - Impingement Syndrome (Rotator Cuff  Tendinitis, Bursitis) These exercises may help you when beginning to rehabilitate your injury. Your symptoms may go away with or without further involvement from your physician, physical therapist or athletic trainer. While completing these exercises, remember:   Restoring tissue flexibility helps normal motion to return to the joints. This allows healthier, less painful movement and activity.   An effective stretch should be held for at least 30 seconds.   A stretch should never be painful. You should only feel a gentle lengthening or release in the stretched tissue.  STRETCH - Flexion,  Standing  Stand with good posture. With an underhand grip on your right / left hand, and an overhand grip on the opposite hand, grasp a broomstick or cane so that your hands are a little more than shoulder width apart.   Keeping your right / left elbow straight and shoulder muscles relaxed, push the stick with your opposite hand, to raise your right / left arm in front of your body and then overhead. Raise your arm until you feel a stretch in your right / left shoulder, but before you have increased shoulder pain.   Try to avoid shrugging your right / left shoulder as your arm rises, by keeping your shoulder blade tucked down and toward your mid-back spine. Hold for __________ seconds.   Slowly return to the starting position.  Repeat __________ times. Complete this exercise __________ times per day. STRETCH - Abduction, Supine  Lie on your back. With an underhand grip on your right / left hand and an overhand grip on the opposite hand, grasp a broomstick or cane so that your hands are a little more than shoulder width apart.   Keeping your right / left elbow straight and your shoulder muscles relaxed, push the stick with your opposite hand, to raise your right / left arm out to the side of your body  and then overhead. Raise your arm until you feel a stretch in your right / left shoulder, but before you have increased shoulder pain.   Try to avoid shrugging your right / left shoulder as your arm rises, by keeping your shoulder blade tucked down and toward your mid-back spine. Hold for __________ seconds.   Slowly return to the starting position.  Repeat __________ times. Complete this exercise __________ times per day. ROM - Flexion, Active-Assisted  Lie on your back. You may bend your knees for comfort.   Grasp a broomstick or cane so your hands are about shoulder width apart. Your right / left hand should grip the end of the stick, so that your hand is positioned "thumbs-up," as if you were  about to shake hands.   Using your healthy arm to lead, raise your right / left arm overhead, until you feel a gentle stretch in your shoulder. Hold for __________ seconds.   Use the stick to assist in returning your right / left arm to its starting position.  Repeat __________ times. Complete this exercise __________ times per day.  ROM - Internal Rotation, Supine   Lie on your back on a firm surface. Place your right / left elbow about 60 degrees away from your side. Elevate your elbow with a folded towel, so that the elbow and shoulder are the same height.   Using a broomstick or cane and your strong arm, pull your right / left hand toward your body until you feel a gentle stretch, but no increase in your shoulder pain. Keep your shoulder and elbow in place throughout the exercise.   Hold for __________ seconds. Slowly return to the starting position.  Repeat __________ times. Complete this exercise __________ times per day. STRETCH - Internal Rotation  Place your right / left hand behind your back, palm up.   Throw a towel or belt over your opposite shoulder. Grasp the towel with your right / left hand.   While keeping an upright posture, gently pull up on the towel, until you feel a stretch in the front of your right / left shoulder.   Avoid shrugging your right / left shoulder as your arm rises, by keeping your shoulder blade tucked down and toward your mid-back spine.   Hold for __________ seconds. Release the stretch, by lowering your healthy hand.  Repeat __________ times. Complete this exercise __________ times per day. ROM - Internal Rotation   Using an underhand grip, grasp a stick behind your back with both hands.   While standing upright with good posture, slide the stick up your back until you feel a mild stretch in the front of your shoulder.   Hold for __________ seconds. Slowly return to your starting position.  Repeat __________ times. Complete this exercise  __________ times per day.  STRETCH - Posterior Shoulder Capsule   Stand or sit with good posture. Grasp your right / left elbow and draw it across your chest, keeping it at the same height as your shoulder.   Pull your elbow, so your upper arm comes in closer to your chest. Pull until you feel a gentle stretch in the back of your shoulder.   Hold for __________ seconds.  Repeat __________ times. Complete this exercise __________ times per day. STRENGTHENING EXERCISES - Impingement Syndrome (Rotator Cuff Tendinitis, Bursitis) These exercises may help you when beginning to rehabilitate your injury. They may resolve your symptoms with or without further involvement from your physician, physical therapist or  Event organiser. While completing these exercises, remember:  Muscles can gain both the endurance and the strength needed for everyday activities through controlled exercises.   Complete these exercises as instructed by your physician, physical therapist or athletic trainer. Increase the resistance and repetitions only as guided.   You may experience muscle soreness or fatigue, but the pain or discomfort you are trying to eliminate should never worsen during these exercises. If this pain does get worse, stop and make sure you are following the directions exactly. If the pain is still present after adjustments, discontinue the exercise until you can discuss the trouble with your clinician.   During your recovery, avoid activity or exercises which involve actions that place your injured hand or elbow above your head or behind your back or head. These positions stress the tissues which you are trying to heal.  STRENGTH - Scapular Depression and Adduction   With good posture, sit on a firm chair. Support your arms in front of you, with pillows, arm rests, or on a table top. Have your elbows in line with the sides of your body.   Gently draw your shoulder blades down and toward your mid-back  spine. Gradually increase the tension, without tensing the muscles along the top of your shoulders and the back of your neck.   Hold for __________ seconds. Slowly release the tension and relax your muscles completely before starting the next repetition.   After you have practiced this exercise, remove the arm support and complete the exercise in standing as well as sitting position.  Repeat __________ times. Complete this exercise __________ times per day.  STRENGTH - Shoulder Abductors, Isometric  With good posture, stand or sit about 4-6 inches from a wall, with your right / left side facing the wall.   Bend your right / left elbow. Gently press your right / left elbow into the wall. Increase the pressure gradually, until you are pressing as hard as you can, without shrugging your shoulder or increasing any shoulder discomfort.   Hold for __________ seconds.   Release the tension slowly. Relax your shoulder muscles completely before you begin the next repetition.  Repeat __________ times. Complete this exercise __________ times per day.  STRENGTH - External Rotators, Isometric  Keep your right / left elbow at your side and bend it 90 degrees.   Step into a door frame so that the outside of your right / left wrist can press against the door frame without your upper arm leaving your side.   Gently press your right / left wrist into the door frame, as if you were trying to swing the back of your hand away from your stomach. Gradually increase the tension, until you are pressing as hard as you can, without shrugging your shoulder or increasing any shoulder discomfort.   Hold for __________ seconds.   Release the tension slowly. Relax your shoulder muscles completely before you begin the next repetition.  Repeat __________ times. Complete this exercise __________ times per day.  STRENGTH - Supraspinatus   Stand or sit with good posture. Grasp a __________ weight, or an exercise band or  tubing, so that your hand is "thumbs-up," like you are shaking hands.   Slowly lift your right / left arm in a "V" away from your thigh, diagonally into the space between your side and straight ahead. Lift your hand to shoulder height or as far as you can, without increasing any shoulder pain. At first, many people do not  lift their hands above shoulder height.   Avoid shrugging your right / left shoulder as your arm rises, by keeping your shoulder blade tucked down and toward your mid-back spine.   Hold for __________ seconds. Control the descent of your hand, as you slowly return to your starting position.  Repeat __________ times. Complete this exercise __________ times per day.  STRENGTH - External Rotators  Secure a rubber exercise band or tubing to a fixed object (table, pole) so that it is at the same height as your right / left elbow when you are standing or sitting on a firm surface.   Stand or sit so that the secured exercise band is at your uninjured side.   Bend your right / left elbow 90 degrees. Place a folded towel or small pillow under your right / left arm, so that your elbow is a few inches away from your side.   Keeping the tension on the exercise band, pull it away from your body, as if pivoting on your elbow. Be sure to keep your body steady, so that the movement is coming only from your rotating shoulder.   Hold for __________ seconds. Release the tension in a controlled manner, as you return to the starting position.  Repeat __________ times. Complete this exercise __________ times per day.  STRENGTH - Internal Rotators   Secure a rubber exercise band or tubing to a fixed object (table, pole) so that it is at the same height as your right / left elbow when you are standing or sitting on a firm surface.   Stand or sit so that the secured exercise band is at your right / left side.   Bend your elbow 90 degrees. Place a folded towel or small pillow under your right /  left arm so that your elbow is a few inches away from your side.   Keeping the tension on the exercise band, pull it across your body, toward your stomach. Be sure to keep your body steady, so that the movement is coming only from your rotating shoulder.   Hold for __________ seconds. Release the tension in a controlled manner, as you return to the starting position.  Repeat __________ times. Complete this exercise __________ times per day.  STRENGTH - Scapular Protractors, Standing   Stand arms length away from a wall. Place your hands on the wall, keeping your elbows straight.   Begin by dropping your shoulder blades down and toward your mid-back spine.   To strengthen your protractors, keep your shoulder blades down, but slide them forward on your rib cage. It will feel as if you are lifting the back of your rib cage away from the wall. This is a subtle motion and can be challenging to complete. Ask your caregiver for further instruction, if you are not sure you are doing the exercise correctly.   Hold for __________ seconds. Slowly return to the starting position, resting the muscles completely before starting the next repetition.  Repeat __________ times. Complete this exercise __________ times per day. STRENGTH - Scapular Protractors, Supine  Lie on your back on a firm surface. Extend your right / left arm straight into the air while holding a __________ weight in your hand.   Keeping your head and back in place, lift your shoulder off the floor.   Hold for __________ seconds. Slowly return to the starting position, and allow your muscles to relax completely before starting the next repetition.  Repeat __________ times. Complete this  exercise __________ times per day. STRENGTH - Scapular Protractors, Quadruped  Get onto your hands and knees, with your shoulders directly over your hands (or as close as you can be, comfortably).   Keeping your elbows locked, lift the back of your rib  cage up into your shoulder blades, so your mid-back rounds out. Keep your neck muscles relaxed.   Hold this position for __________ seconds. Slowly return to the starting position and allow your muscles to relax completely before starting the next repetition.  Repeat __________ times. Complete this exercise __________ times per day.  STRENGTH - Scapular Retractors  Secure a rubber exercise band or tubing to a fixed object (table, pole), so that it is at the height of your shoulders when you are either standing, or sitting on a firm armless chair.   With a palm down grip, grasp an end of the band in each hand. Straighten your elbows and lift your hands straight in front of you, at shoulder height. Step back, away from the secured end of the band, until it becomes tense.   Squeezing your shoulder blades together, draw your elbows back toward your sides, as you bend them. Keep your upper arms lifted away from your body throughout the exercise.   Hold for __________ seconds. Slowly ease the tension on the band, as you reverse the directions and return to the starting position.  Repeat __________ times. Complete this exercise __________ times per day. STRENGTH - Shoulder Extensors   Secure a rubber exercise band or tubing to a fixed object (table, pole) so that it is at the height of your shoulders when you are either standing, or sitting on a firm armless chair.   With a thumbs-up grip, grasp an end of the band in each hand. Straighten your elbows and lift your hands straight in front of you, at shoulder height. Step back, away from the secured end of the band, until it becomes tense.   Squeezing your shoulder blades together, pull your hands down to the sides of your thighs. Do not allow your hands to go behind you.   Hold for __________ seconds. Slowly ease the tension on the band, as you reverse the directions and return to the starting position.  Repeat __________ times. Complete this exercise  __________ times per day.  STRENGTH - Scapular Retractors and External Rotators   Secure a rubber exercise band or tubing to a fixed object (table, pole) so that it is at the height as your shoulders, when you are either standing, or sitting on a firm armless chair.   With a palm down grip, grasp an end of the band in each hand. Bend your elbows 90 degrees and lift your elbows to shoulder height, at your sides. Step back, away from the secured end of the band, until it becomes tense.   Squeezing your shoulder blades together, rotate your shoulders so that your upper arms and elbows remain stationary, but your fists travel upward to head height.   Hold for __________ seconds. Slowly ease the tension on the band, as you reverse the directions and return to the starting position.  Repeat __________ times. Complete this exercise __________ times per day.  STRENGTH - Scapular Retractors and External Rotators, Rowing   Secure a rubber exercise band or tubing to a fixed object (table, pole) so that it is at the height of your shoulders, when you are either standing, or sitting on a firm armless chair.   With a palm down  grip, grasp an end of the band in each hand. Straighten your elbows and lift your hands straight in front of you, at shoulder height. Step back, away from the secured end of the band, until it becomes tense.   Step 1: Squeeze your shoulder blades together. Bending your elbows, draw your hands to your chest, as if you are rowing a boat. At the end of this motion, your hands and elbow should be at shoulder height and your elbows should be out to your sides.   Step 2: Rotate your shoulders, to raise your hands above your head. Your forearms should be vertical and your upper arms should be horizontal.   Hold for __________ seconds. Slowly ease the tension on the band, as you reverse the directions and return to the starting position.  Repeat __________ times. Complete this exercise  __________ times per day.  STRENGTH - Scapular Depressors  Find a sturdy chair without wheels, such as a dining room chair.   Keeping your feet on the floor, and your hands on the chair arms, lift your bottom up from the seat, and lock your elbows.   Keeping your elbows straight, allow gravity to pull your body weight down. Your shoulders will rise toward your ears.   Raise your body against gravity by drawing your shoulder blades down your back, shortening the distance between your shoulders and ears. Although your feet should always maintain contact with the floor, your feet should progressively support less body weight, as you get stronger.   Hold for __________ seconds. In a controlled and slow manner, lower your body weight to begin the next repetition.  Repeat __________ times. Complete this exercise __________ times per day.  Document Released: 07/25/2005 Document Revised: 07/14/2011 Document Reviewed: 11/06/2008 [facility] Patient Information 2012 [place], [place].Rotator Cuff Tear The rotator cuff is four tendons that assist in the motion of the shoulder. A rotator cuff tear is a tear in one of these four tendons. It is characterized by pain and weakness of the shoulder. The rotator cuff tendons surround the shoulder ball and socket joint (humeral head). The rotator cuff tendons attach to the shoulder blade (scapula) on one side and the upper arm bone (humerus) on the other side. The rotator cuff is essential for shoulder stability and shoulder motion. SYMPTOMS   Pain around the shoulder, often at the outer portion of the upper arm.   Pain that is worse with shoulder function, especially when reaching overhead or lifting.   Weakness of the shoulder muscles.   Aching when not using your arm; often, pain awakens you at night, especially when sleeping on the affected side.   Tenderness, swelling, warmth, or redness over the outer aspect of the shoulder.   Loss of strength.   Limited  motion of the shoulder, especially reaching behind (reaching into one's back pocket) or across your body.   A crackling sound (crepitation) when moving the shoulder.   Biceps tendon pain (in the front of the shoulder) and inflammation, worse with bending the elbow or lifting.  CAUSES   Strain from sudden increase in amount or intensity of activity.   Direct blow or injury to the shoulder.   Aging, wear from from normal use.   Roof of the shoulder (acromial) spur.  RISK INCREASES WITH:   Contact sports (football, wrestling, or boxing).   Throwing or hitting sports (baseball, tennis, or volleyball).   Weightlifting and bodybuilding.   Heavy labor.   Previous injury to rotator cuff.  Failure to warm up properly before activity.   Inadequate protective equipment.   Increasing age.   Spurring of the outer end of the scapula (acromion).   Cortisone injections.   Poor shoulder strength and flexibility.  PREVENTION  Warm up and stretch properly before activity.   Allow time for rest and recovery between practices and competition.   Maintain physical fitness:   Cardiovascular fitness.   Shoulder flexibility.   Strength and endurance of the rotator cuff muscles and muscles of the shoulder blade.   Learn and use proper technique when throwing or hitting.  PROGNOSIS Surgery is often needed. Although, symptoms may go away by themselves. RELATED COMPLICATIONS   Persistent pain that may progress to constant pain.   Shoulder stiffness, frozen shoulder syndrome, or loss of motion.   Recurrence of symptoms, especially if treated without surgery.   Inability to return to same level of sports, even with surgery.   Persistent weakness.   Risks of surgery, including infection, bleeding, injury to nerves, shoulder stiffness, weakness, re-tearing of the rotator cuff tendon.   Deltoid detachment, acromial fracture, and persistent pain.  TREATMENT Treatment involves the  use of ice and medicine to reduce pain and inflammation. Strengthening and stretching exercise are usually recommended. These exercises may be completed at home or with a therapist. You may also be instructed to modify offending activities. Corticosteroid injections may be given to reduce inflammation. Surgery is usually recommended for athletes. Surgery has the best chance for a full recovery. Surgery involves:  Removal of an inflamed bursa.   Removal of an acromial spur if present.   Suturing the torn tendon back together.  Rotator cuff surgeries may be preformed either arthroscopically or through an open incision. Recovery typically takes 6 to 12 months. MEDICATION  If pain medicine is necessary, then nonsteroidal anti-inflammatory medicines, such as aspirin and ibuprofen, or other minor pain relievers, such as acetaminophen, are often recommended.   Do not take pain medicine for 7 days before surgery.   Prescription pain relievers are usually only prescribed after surgery. Use only as directed and only as much as you need.   Corticosteroid injections may be given to reduce inflammation. However, there is a limited number of times the joint may be injected with these medicines.  HEAT AND COLD  Cold treatment (icing) relieves pain and reduces inflammation. Cold treatment should be applied for 10 to 15 minutes every 2 to 3 hours for inflammation and pain and immediately after any activity that aggravates your symptoms. Use ice packs or massage the area with a piece of ice (ice massage).   Heat treatment may be used prior to performing the stretching and strengthening activities prescribed by your caregiver, physical therapist, or athletic trainer. Use a heat pack or soak the injury in warm water.  SEEK MEDICAL CARE IF:   Symptoms get worse or do not improve in 4 to 6 weeks despite treatment.   You experience pain, numbness, or coldness in the hand.   Blue, gray, or dark color appears in  the fingernails.   New, unexplained symptoms develop (drugs used in treatment may produce side effects).  Document Released: 07/25/2005 Document Revised: 07/14/2011 Document Reviewed: 11/06/2008 [facility] Patient Information 2012 [place], [place].

## 2012-03-07 NOTE — Telephone Encounter (Signed)
Was Rx faxed to pharmacy?

## 2012-03-08 NOTE — Telephone Encounter (Signed)
Pt aware script was sent in

## 2012-03-15 ENCOUNTER — Other Ambulatory Visit: Payer: Self-pay | Admitting: Family Medicine

## 2012-03-15 MED ORDER — MELOXICAM 7.5 MG PO TABS: ORAL_TABLET | ORAL | Status: DC

## 2012-03-15 NOTE — Telephone Encounter (Signed)
Will forward message to Dr.Breen. 

## 2012-03-15 NOTE — Telephone Encounter (Signed)
Needs refill on her meloxicam - has an appt on 8/20 but has 1 left.

## 2012-03-15 NOTE — Telephone Encounter (Signed)
Refill meloxicam sent in.  Paula Compton, MD

## 2012-03-22 ENCOUNTER — Ambulatory Visit: Payer: Self-pay | Admitting: Psychology

## 2012-03-27 ENCOUNTER — Encounter: Payer: Self-pay | Admitting: Family Medicine

## 2012-03-27 ENCOUNTER — Ambulatory Visit (INDEPENDENT_AMBULATORY_CARE_PROVIDER_SITE_OTHER): Payer: Self-pay | Admitting: Family Medicine

## 2012-03-27 VITALS — BP 115/75 | HR 71 | Ht 63.0 in | Wt 153.7 lb

## 2012-03-27 DIAGNOSIS — F341 Dysthymic disorder: Secondary | ICD-10-CM

## 2012-03-27 DIAGNOSIS — M75101 Unspecified rotator cuff tear or rupture of right shoulder, not specified as traumatic: Secondary | ICD-10-CM

## 2012-03-27 DIAGNOSIS — F418 Other specified anxiety disorders: Secondary | ICD-10-CM

## 2012-03-27 DIAGNOSIS — G479 Sleep disorder, unspecified: Secondary | ICD-10-CM

## 2012-03-27 DIAGNOSIS — R358 Other polyuria: Secondary | ICD-10-CM

## 2012-03-27 DIAGNOSIS — S43429A Sprain of unspecified rotator cuff capsule, initial encounter: Secondary | ICD-10-CM

## 2012-03-27 DIAGNOSIS — R3589 Other polyuria: Secondary | ICD-10-CM

## 2012-03-27 LAB — CBC
HCT: 40.7 % (ref 36.0–46.0)
MCHC: 33.7 g/dL (ref 30.0–36.0)
Platelets: 344 10*3/uL (ref 150–400)
RDW: 13.7 % (ref 11.5–15.5)
WBC: 7.8 10*3/uL (ref 4.0–10.5)

## 2012-03-27 LAB — POCT UA - GLUCOSE/PROTEIN: Protein, UA: NEGATIVE

## 2012-03-27 NOTE — Progress Notes (Signed)
Subjective:     Patient ID: Vicki Vicki [name], female   DOB: 1960-12-17, 51 y.o.   MRN: [id number]  HPI 51 yo woman presents to clinic today for f/u on depression.  Attending/Primary MD Note; Patient seen and examined by me in conjunction with medical student Vicki Vicki [name] (MS3 [facility]).  I agree with his documentation, with following additions/amendments: Vicki [name] Mood: -Pt reports being down and depressed for quite a while.  Stressors in her life include have to take care of her two grandchildren (5 & 91 yo) and her husband who has health issues and alcoholism.  She is currently taking Buspar 10 mg once daily and has continued to complain of excessive sleepiness.  Since her last visit she reports being increasingly sleepy throughout the day.  She sleeps on average 6 1/2 hours a night and has no issues falling asleep.  She does wake up 2-3x/night to go to the bathroom or due to pain in her right arm but has no issues falling back to sleep.  She takes a 2 hour nap on average after work each day but states that she still feels sleepy afterwards.  She denies any feelings of wanting to hurt or Vicki [name].  She has a history of taking Zanax that were not prescribed to her to help her sleep but she is not longer taking them (~4 months ago).  She has been seen by Dr. Pascal Vicki [name] [facility] but states that she did not enjoy their interaction. Vicki Vicki [name] continues to complain of significant pain in the Right shoulder, as well as increasing pain in the left.  She was seen by Dr Vicki Vicki [name] and says the tramadol does not help her pain, adds to her excessive sleepiness.  Does not sleep well in the evening, awakens with same feeling of tiredness. Will sometimes take a 2 hour nap in the afternoon, which does not leave her refreshed.  Can fall asleep easily as a passenger in a vehicle.  Unsure of snoring.  Feels the Buspar may be helping her anxiety, but has reduced to 1 pill/day on her own because of concerns of sedation.  Vicki [name] Pain/Rotator Cuff: -Pt states that pain continues to persist in her right shoulder due to a known rotator cuff tear.  According to the pt, the orthopedist believes that the tear is too severe to benefit from surgery. She currently is taking meloxicam for the pain but states that the pain is not well controlled and waking her up at night.  She has tried tramadol in the past but does not like the medication.  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  She has been turned down for disability, and her appeal has likewise been denied.  Her grandsons are spending every other weekend with their other grandmother, which gives Vicki Vicki [name] a bit of a break.  States her husband Vicki Vicki [name] is still doing poorly, says he's "not going to be around for long" and refuses to come to doctor for visit.   Review of Systems See HPI     Objective:   Physical Exam Deferred   Alert, flat affect. No apparent distress. Neck supple.  Somewhat emotionally labile when talking about dire situation at home, Vicki [name]'s health.  Gait unremarkable without assistance. Vicki [name] Assessment:     51 yo woman with depression and chronic pain 2/2 right rotator cuff tear.      Plan:    See Problem Based A/P for  more details. Vicki [name] Depression: -continue medications as prescribed  Pain: -referral to pain specialist to evaluate options to control pain -consider opoids at later date if no other options remain  Labs: -CBC, metabolic panel, TSH, U/A

## 2012-03-27 NOTE — Patient Instructions (Addendum)
It was a pleasure to see you today.  I ask that you continue your medicines the way you are taking them.   I am sending you to see a Pain Specialist for your complex pain needs that are affecting your ability to work.   I will contact you with the results of the labs, which may help [place] understand the reasons behind your poor sleep.   I would like to see you back in the coming 1 month.

## 2012-03-28 LAB — COMPREHENSIVE METABOLIC PANEL WITH GFR
ALT: 22 U/L (ref 0–35)
AST: 17 U/L (ref 0–37)
Albumin: 4.5 g/dL (ref 3.5–5.2)
Alkaline Phosphatase: 62 U/L (ref 39–117)
BUN: 11 mg/dL (ref 6–23)
CO2: 29 meq/L (ref 19–32)
Calcium: 10 mg/dL (ref 8.4–10.5)
Chloride: 103 meq/L (ref 96–112)
Creat: 0.55 mg/dL (ref 0.50–1.10)
Glucose, Bld: 99 mg/dL (ref 70–99)
Potassium: 4 meq/L (ref 3.5–5.3)
Sodium: 140 meq/L (ref 135–145)
Total Bilirubin: 0.3 mg/dL (ref 0.3–1.2)
Total Protein: 7 g/dL (ref 6.0–8.3)

## 2012-03-28 LAB — TSH: TSH: 1.004 u[IU]/mL (ref 0.350–4.500)

## 2012-03-28 NOTE — Assessment & Plan Note (Signed)
Patient with continued pain in R shoulder; has seen rehab, has been to physical therapy and [facility], tried on several non-narcotic analgesics without adequate relief.  Continues to work out of Pensions consultant.  Has been turned down for disability and lost her appeal.  Will refer for Pain Management consultation.  I wonder if she would be a candidate for long-term opioid management of her chronic MSK pain that has not responded to other modalities/meds.  She has young children in the home (grandsons), a daughter who lost custody of the grandsons due in part to illicit substance use, and a husband with alcoholism.  She does not drink nor use illicit drugs herself.  Will be interested in the recommendations of Pain Mgmt physicians regarding management of her chronic pain.  JB

## 2012-03-28 NOTE — Assessment & Plan Note (Signed)
Her PHQ9 score today is 14 (3 pts each for Q#3,4,5; 1 pt each for #1,2,6,7,8; zero pts for Q#9).  Has expressed her feeling that counseling sessions are not as helpful for her now, does not plan on going.  Will continue current medication regimen.  Her excessive sleepiness may be related to meds, but I suspect other factors (?OSA) are at play.  Will plan for formal Epworth Sleepiness Scale at next visit.  Perhaps CSW contact for identification of other resources that could be of help. JB

## 2012-03-28 NOTE — Assessment & Plan Note (Signed)
Concern for possible OSA as contributing to her excessive sleepiness.  She has reduced the doses of her buspirone on her own, without much relief.  ESS at next visit. JB

## 2012-03-29 ENCOUNTER — Encounter: Payer: Self-pay | Admitting: Family Medicine

## 2012-04-02 NOTE — Progress Notes (Signed)
Clinical Child psychotherapist (CSW) received a referral to assist pt who has been denied for disability. CSW contacted the pt who confirmed that she was denied disability, appealed the denial and denied the appeal. CSW explored whether pt has requested assistance from an attorney. Pt stated she did go to an attorney who unfortunately told her that she does not have a good enough case to grant her disability. Pt stated she has since then gone to a specialist who agrees that pt medical conditions are severe enough to where pt should not be working. CSW encouraged pt to have the physician write her a letter to include pt medical history, concerns etc and to then follow up with the attorney. CSW also informed pt about the [facility], an agency in [place] that facilitates with the disability process when an individual has been denied. Pt states she is familiar with that agency. Pt will request a letter from her specialist and follow up with the attorney. Pt appreciative of CSW call.  Theresia Bough, MSW, Theresia Majors [phone]

## 2012-04-20 ENCOUNTER — Other Ambulatory Visit: Payer: Self-pay | Admitting: Family Medicine

## 2012-04-20 DIAGNOSIS — Z1231 Encounter for screening mammogram for malignant neoplasm of breast: Secondary | ICD-10-CM

## 2012-05-02 ENCOUNTER — Encounter: Payer: Self-pay | Admitting: Physical Medicine & Rehabilitation

## 2012-05-02 ENCOUNTER — Encounter: Payer: Self-pay | Attending: Physical Medicine & Rehabilitation | Admitting: Physical Medicine & Rehabilitation

## 2012-05-02 VITALS — BP 115/68 | HR 87 | Resp 14 | Ht 64.5 in | Wt 157.0 lb

## 2012-05-02 DIAGNOSIS — G479 Sleep disorder, unspecified: Secondary | ICD-10-CM

## 2012-05-02 DIAGNOSIS — S46919A Strain of unspecified muscle, fascia and tendon at shoulder and upper arm level, unspecified arm, initial encounter: Secondary | ICD-10-CM | POA: Insufficient documentation

## 2012-05-02 DIAGNOSIS — F341 Dysthymic disorder: Secondary | ICD-10-CM | POA: Insufficient documentation

## 2012-05-02 DIAGNOSIS — F17200 Nicotine dependence, unspecified, uncomplicated: Secondary | ICD-10-CM | POA: Insufficient documentation

## 2012-05-02 DIAGNOSIS — S46819A Strain of other muscles, fascia and tendons at shoulder and upper arm level, unspecified arm, initial encounter: Secondary | ICD-10-CM | POA: Insufficient documentation

## 2012-05-02 DIAGNOSIS — M67919 Unspecified disorder of synovium and tendon, unspecified shoulder: Secondary | ICD-10-CM | POA: Insufficient documentation

## 2012-05-02 DIAGNOSIS — S43429A Sprain of unspecified rotator cuff capsule, initial encounter: Secondary | ICD-10-CM

## 2012-05-02 DIAGNOSIS — M75102 Unspecified rotator cuff tear or rupture of left shoulder, not specified as traumatic: Secondary | ICD-10-CM

## 2012-05-02 DIAGNOSIS — IMO0001 Reserved for inherently not codable concepts without codable children: Secondary | ICD-10-CM | POA: Insufficient documentation

## 2012-05-02 DIAGNOSIS — F418 Other specified anxiety disorders: Secondary | ICD-10-CM

## 2012-05-02 DIAGNOSIS — X58XXXA Exposure to other specified factors, initial encounter: Secondary | ICD-10-CM | POA: Insufficient documentation

## 2012-05-02 DIAGNOSIS — M75101 Unspecified rotator cuff tear or rupture of right shoulder, not specified as traumatic: Secondary | ICD-10-CM

## 2012-05-02 DIAGNOSIS — M719 Bursopathy, unspecified: Secondary | ICD-10-CM | POA: Insufficient documentation

## 2012-05-02 MED ORDER — HYDROCODONE-ACETAMINOPHEN 5-325 MG PO TABS: 1.0000 | ORAL_TABLET | Freq: Three times a day (TID) | ORAL | Status: DC | PRN

## 2012-05-02 NOTE — Progress Notes (Signed)
Subjective:    Patient ID: Vicki [name], female    DOB: May 04, 1961, 51 y.o.   MRN: [id number]  HPI  Vicki [name] is back regarding her bilateral shoulder pain. The tramadol helped a little bit. She still has a hard time sleeping at night. She tried the shoulder exercises i provided, but these tended to irritate he shoulders. She was going about them fairly vigorously and occasionally used resistance.  She has stopped working in house cleaning and this has helped her pain levels.   Pain Inventory Average Pain 8 Pain Right Now 8 My pain is aching  In the last 24 hours, has pain interfered with the following? General activity 8 Relation with others 10 Enjoyment of life 8 What TIME of day is your pain at its worst? evening and night Sleep (in general) Poor  Pain is worse with: some activites Pain improves with: rest and medication Relief from Meds: 2  Mobility walk without assistance ability to climb steps?  yes do you drive?  yes  Function not employed: date last employed  I need assistance with the following:  dressing, bathing and household duties  Neuro/Psych numbness tingling trouble walking depression anxiety  Prior Studies Any changes since last visit?  no  Physicians involved in your care Any changes since last visit?  no   Family History  Problem Relation Age of Onset  . Heart disease Mother   . Diabetes Mother   . Cancer Mother   . Heart disease Father    History   Social History  . Marital Status: Married    Spouse Name: N/A    Number of Children: N/A  . Years of Education: N/A   Social History Main Topics  . Smoking status: Current Every Day Smoker -- 1.8 packs/day    Types: Cigarettes  . Smokeless tobacco: Never Used   Comment: unable to quitt at this time. too much stress in life  . Alcohol Use: None  . Drug Use: None  . Sexually Active: None   Other Topics Concern  . None   Social History Narrative  . None   Past Surgical History   Procedure Date  . Tubal ligation    History reviewed. No pertinent past medical history. BP 115/68  Pulse 87  Resp 14  Ht 5' 4.5" (1.638 m)  Wt 157 lb (71.215 kg)  BMI 26.53 kg/m2  SpO2 97%    Review of Systems  HENT: Positive for neck pain.   Musculoskeletal: Positive for gait problem.  Neurological: Positive for weakness and numbness.       Tingling  Psychiatric/Behavioral: Positive for dysphoric mood. The patient is nervous/anxious.   All other systems reviewed and are negative.       Objective:   Physical Exam General: Alert and oriented x 3, No apparent distress  HEENT: Head is normocephalic, atraumatic, PERRLA, EOMI, sclera anicteric, oral mucosa pink and moist, dentition intact, ext ear canals clear,  Neck: Supple without JVD or lymphadenopathy  Heart: Reg rate and rhythm. No murmurs rubs or gallops  Chest: CTA bilaterally without wheezes, rales, or rhonchi; no distress  Abdomen: Soft, non-tender, non-distended, bowel sounds positive.  Extremities: No clubbing, cyanosis, or edema. Pulses are 2+  Skin: Clean and intact without signs of breakdown  Neuro: Pt is cognitively appropriate with normal insight, memory, and awareness. Cranial nerves 2-12 are intact. Sensory exam is normal. Reflexes are 2+ in all 4's. Fine motor coordination is intact. No tremors. Motor function is grossly 5/5  less that which was affected by pain. Standing posture was fair. She made without difficulty.  Musculoskeletal: Right shoulder with significant pain upon palpation particularly in the acromial region. She has pain in the subdeltoid area as well. She had difficulty lifting the arm in abduction for the first 20-30. She tends to compensate with her shoulder to lift the arm. Internal and external rotation of the shoulder causes significant pain. She does have protraction and elevation of the scapula with tight trapezius muscle noted also. Trapezius was tender. Left rotator cuff was also tender  with provocative maneuvers but she did have full range of motion there. Strength is generally near 5 out of 5 except for pain inhibition at the shoulders.Marland Kitchen  Psych: Pt's affect is appropriate. Pt is cooperative   Assessment & Plan:   1. Right rotator cuff syndrome: with complete supraspinatus tear, partial infraspinatus tear.  2. 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 Depression with anxiety  5. Tobacco abuse   Plan:  1. Discussed appropriate posture and adaptive techniques. I think it's a positive that she has backed off work. She has secondary myofascial pain due to her compensation. 2. She may continue with her meloxicam 7.5 mg twice a day with food. I advised her that she can take up to 2000 mg safely of Tylenol each day. She may take the Tylenol with her meloxicam.She needs to utilize this along with her other meds 3. Will try hydrocodone for breakthrough pain. DC tramadol 4. Reviwed rotator cuff information and exercises today. She needs to work on strengthening/balancing/relaxing her scapular stabilizing muscles. She should be performing exercises only against gravity. Again, she should not be using any resistance.  5. Discussed the impact of anxiety and depression on her pain is well.  6. Discussed smoking cessation  7. I will have her see my PA in about 3 months time. All questions were encouraged and answered. Did discuss the possibility of a voc rehab referral. I think she could participate in sedentary work.

## 2012-05-02 NOTE — Patient Instructions (Signed)
Continue working on your range of motion, but DO NOT use resistance!!

## 2012-05-07 ENCOUNTER — Ambulatory Visit (HOSPITAL_COMMUNITY)
Admission: RE | Admit: 2012-05-07 | Discharge: 2012-05-07 | Disposition: A | Discharge status: Home / Self Care | Payer: Self-pay | Source: Ambulatory Visit | Attending: Family Medicine | Admitting: Family Medicine

## 2012-05-07 DIAGNOSIS — Z1231 Encounter for screening mammogram for malignant neoplasm of breast: Secondary | ICD-10-CM | POA: Insufficient documentation

## 2012-05-18 ENCOUNTER — Other Ambulatory Visit: Payer: Self-pay | Admitting: Family Medicine

## 2012-05-29 ENCOUNTER — Ambulatory Visit (INDEPENDENT_AMBULATORY_CARE_PROVIDER_SITE_OTHER): Payer: Self-pay | Admitting: Family Medicine

## 2012-05-29 ENCOUNTER — Encounter: Payer: Self-pay | Admitting: Family Medicine

## 2012-05-29 VITALS — BP 108/71 | HR 80 | Temp 98.4°F | Ht 64.5 in | Wt 154.7 lb

## 2012-05-29 DIAGNOSIS — Z23 Encounter for immunization: Secondary | ICD-10-CM

## 2012-05-29 DIAGNOSIS — B370 Candidal stomatitis: Secondary | ICD-10-CM

## 2012-05-29 MED ORDER — NYSTATIN 100000 UNIT/ML MT SUSP: 500000.0000 [IU] | Freq: Four times a day (QID) | OROMUCOSAL | Status: DC

## 2012-05-29 NOTE — Patient Instructions (Addendum)
It was a pleasure to see you today.    I am prescribing an oral antifungal (nystatin solution) for the thrush.  Swish and swallow a teaspoon of the medicine, 4 times daily.  If it does not get better or resolve completely in the next 2 weeks, I will need to see you again.  If not completely resolved, we may need to do additional testing to find the cause as we discussed.  PLEASE MAKE APPOINTMENT FOR PATIENT'S HUSBAND, Vicki [name], IN THE COMING 2 WEEKS FOR FOLLOW UP.

## 2012-05-30 NOTE — Progress Notes (Signed)
  Subjective:    Patient ID: JANIAH DEVINNEY, female    DOB: 1961/02/04, 51 y.o.   MRN: [id number]  HPI Lasaundra presents today with complaint of lump/knot in her throat, which she feels more prominently when swallowing.  Has been present for three days.  Accompanied by some mild hoarseness.  No fevers or chills, no ear pain, no emesis.  She has had a cough ("flu") over the past 1 month, using otc cough meds without improvement.  Cough productive of green phlegm.  No hemoptysis.   She continues to smoke, but is working to cut down.   Her husband [name] has advanced cirrhotic liver disease, is at home and not doing well.     Review of Systems See above    Objective:   Physical Exam Well appearing, no apparent distress HEENT Neck supple, no masses or cervical adenopathy. Tongue exam by palpation without masses.  Oropharynx with white plaques across soft and hard palate.  No asymmetry or erythema; uvula is midline.  Dentures removed for exam. TMs clear bilaterally.        Assessment & Plan:

## 2012-05-30 NOTE — Assessment & Plan Note (Signed)
Suspect oropharyngeal (possibly esophageal) candidiasis as cause of her symptoms.  I discussed with Vicki [name] at length that her hoarseness and the cough make me concerned about malignancies of the head/neck, as well as lung.  This would best be evaluated by starting with laryngoscopy with ENT.  If sxs do not resolve with treatment with nystatin swish and swallow within two weeks, then to consider further workup (possibly with CT imaging to start if unable to get with ENT in short-term). She understands the importance of follow up if not completely resolved within the 2 weeks, or if worse in the interim.

## 2012-06-24 ENCOUNTER — Other Ambulatory Visit: Payer: Self-pay | Admitting: Family Medicine

## 2012-07-23 ENCOUNTER — Encounter: Payer: Self-pay | Admitting: Physical Medicine & Rehabilitation

## 2012-07-23 ENCOUNTER — Encounter: Payer: Self-pay | Attending: Physical Medicine & Rehabilitation | Admitting: Physical Medicine & Rehabilitation

## 2012-07-23 VITALS — BP 119/70 | HR 97 | Resp 14 | Ht 63.0 in | Wt 160.0 lb

## 2012-07-23 DIAGNOSIS — M75101 Unspecified rotator cuff tear or rupture of right shoulder, not specified as traumatic: Secondary | ICD-10-CM

## 2012-07-23 DIAGNOSIS — M719 Bursopathy, unspecified: Secondary | ICD-10-CM | POA: Insufficient documentation

## 2012-07-23 DIAGNOSIS — X58XXXA Exposure to other specified factors, initial encounter: Secondary | ICD-10-CM | POA: Insufficient documentation

## 2012-07-23 DIAGNOSIS — M7540 Impingement syndrome of unspecified shoulder: Secondary | ICD-10-CM

## 2012-07-23 DIAGNOSIS — F341 Dysthymic disorder: Secondary | ICD-10-CM

## 2012-07-23 DIAGNOSIS — S43429A Sprain of unspecified rotator cuff capsule, initial encounter: Secondary | ICD-10-CM

## 2012-07-23 DIAGNOSIS — F418 Other specified anxiety disorders: Secondary | ICD-10-CM

## 2012-07-23 DIAGNOSIS — M75102 Unspecified rotator cuff tear or rupture of left shoulder, not specified as traumatic: Secondary | ICD-10-CM

## 2012-07-23 DIAGNOSIS — M67919 Unspecified disorder of synovium and tendon, unspecified shoulder: Secondary | ICD-10-CM | POA: Insufficient documentation

## 2012-07-23 DIAGNOSIS — IMO0001 Reserved for inherently not codable concepts without codable children: Secondary | ICD-10-CM

## 2012-07-23 DIAGNOSIS — Z5181 Encounter for therapeutic drug level monitoring: Secondary | ICD-10-CM | POA: Insufficient documentation

## 2012-07-23 NOTE — Progress Notes (Signed)
Subjective:    Patient ID: Vicki [name], female    DOB: 01-15-1961, 51 y.o.   MRN: [id number]  HPI  Vicki [name] is here regarding her chronic shoulder pain. Since I last saw her, her husband has become very ill with liver disease. Hospice sees him currently. She has had to provide a lot of care for hiim regardless. She is stressed also about some grandchildren in the home.  She doesn't take the hydrocodone because she couldn't afford them. She was given something by Dr. Mauricio Po which she usually uses at night. She continues with the meloxicam and tylenol. Pain seems to be worst at nighttime. She is not performing her exercises regularly.   Pain Inventory Average Pain 8 Pain Right Now 7 My pain is intermittent and aching  In the last 24 hours, has pain interfered with the following? General activity 7 Relation with others 7 Enjoyment of life 7 What TIME of day is your pain at its worst? night Sleep (in general) Fair  Pain is worse with: unsure Pain improves with: rest and medication Relief from Meds: 1  Mobility walk without assistance ability to climb steps?  no do you drive?  yes  Function I need assistance with the following:  household duties  Neuro/Psych No problems in this area  Prior Studies Any changes since last visit?  no  Physicians involved in your care Any changes since last visit?  no   Family History  Problem Relation Age of Onset  . Heart disease Mother   . Diabetes Mother   . Cancer Mother   . Heart disease Father    History   Social History  . Marital Status: Married    Spouse Name: N/A    Number of Children: N/A  . Years of Education: N/A   Social History Main Topics  . Smoking status: Current Every Day Smoker -- 1.8 packs/day    Types: Cigarettes  . Smokeless tobacco: Never Used     Comment: unable to quitt at this time. too much stress in life  . Alcohol Use: None  . Drug Use: None  . Sexually Active: None   Other Topics Concern   . None   Social History Narrative  . None   Past Surgical History  Procedure Date  . Tubal ligation    History reviewed. No pertinent past medical history. BP 119/70  Pulse 97  Resp 14  Ht 5\' 3"  (1.6 m)  Wt 160 lb (72.576 kg)  BMI 28.34 kg/m2  SpO2 97%     Review of Systems  Musculoskeletal:       Shoulder pain  All other systems reviewed and are negative.       Objective:   Physical Exam General: Alert and oriented x 3, No apparent distress  HEENT: Head is normocephalic, atraumatic, PERRLA, EOMI, sclera anicteric, oral mucosa pink and moist, dentition intact, ext ear canals clear,  Neck: Supple without JVD or lymphadenopathy  Heart: Reg rate and rhythm. No murmurs rubs or gallops  Chest: CTA bilaterally without wheezes, rales, or rhonchi; no distress  Abdomen: Soft, non-tender, non-distended, bowel sounds positive.  Extremities: No clubbing, cyanosis, or edema. Pulses are 2+  Skin: Clean and intact without signs of breakdown  Neuro: Pt is cognitively appropriate with normal insight, memory, and awareness. Cranial nerves 2-12 are intact. Sensory exam is normal. Reflexes are 2+ in all 4's. Fine motor coordination is intact. No tremors. Motor function is grossly 5/5 less that which was affected  by pain. Standing posture was fair. She made without difficulty.  Musculoskeletal: Right shoulder with significant pain upon palpation particularly in the acromial region. She has pain in the subdeltoid area as well. She had difficulty lifting the arm in abduction for the first 20-30.  Marland Kitchen Internal and external rotation of the shoulder causes significant pain. She does have protraction and elevation of the scapula with tight trapezius muscle noted also. Trapezius was tender. Left rotator cuff was also tender with provocative maneuvers but she did have full range of motion there. Strength is generally near 5 out of 5 except for pain inhibition at the shoulders.Marland Kitchen  Psych: Pt's affect is  appropriate. Pt is cooperative    Assessment & Plan:   1. Right rotator cuff syndrome: with complete supraspinatus tear, partial infraspinatus tear.  2. 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 Depression with anxiety  5. Tobacco abuse   Plan:  1. Discussed appropriate posture and adaptive techniques. I think the stress of her husband's condition as well as other psychosocial issues is increasing her pain.   2. She may continue with her meloxicam 7.5 mg twice a day with food as well as tylenol 3. Break through pain medicine per PCP  4.Again Reviewed rotator cuff information and exercises today. She needs to work on strengthening/balancing/relaxing her scapular stabilizing muscles. She should be performing exercises only against gravity. Again, she should not be using any resistance. We discussed that if she continues lose ROM her pain will increase with it. 6. Discussed smoking cessation  7. I will see her PRN

## 2012-07-23 NOTE — Patient Instructions (Signed)
CALL ME WITH QUESTIONS 

## 2012-07-29 ENCOUNTER — Other Ambulatory Visit: Payer: Self-pay | Admitting: Family Medicine

## 2012-08-28 ENCOUNTER — Ambulatory Visit: Payer: Self-pay | Admitting: Family Medicine

## 2012-08-31 ENCOUNTER — Ambulatory Visit (INDEPENDENT_AMBULATORY_CARE_PROVIDER_SITE_OTHER): Payer: No Typology Code available for payment source | Admitting: Family Medicine

## 2012-08-31 ENCOUNTER — Encounter: Payer: Self-pay | Admitting: Family Medicine

## 2012-08-31 VITALS — BP 123/84 | HR 62 | Temp 98.0°F | Ht 63.0 in | Wt 159.2 lb

## 2012-08-31 DIAGNOSIS — F3289 Other specified depressive episodes: Secondary | ICD-10-CM

## 2012-08-31 DIAGNOSIS — F329 Major depressive disorder, single episode, unspecified: Secondary | ICD-10-CM

## 2012-08-31 DIAGNOSIS — M25519 Pain in unspecified shoulder: Secondary | ICD-10-CM

## 2012-08-31 MED ORDER — BUSPIRONE HCL 10 MG PO TABS: 5.0000 mg | ORAL_TABLET | Freq: Two times a day (BID) | ORAL | Status: DC

## 2012-08-31 NOTE — Patient Instructions (Addendum)
It was a pleasure to see you today.    I injected your right shoulder today to help with the pain.  As we discussed, I expect that the effects of the injection should give you short-term relief of the right shoulder pain that was a result of your fall last Thursday.   For the buspar, I ask that you take 1/2 tablet in the morning and 1/2 tablet in the evening.   You may leave the disability paperwork at the front desk when you get it.  If it is too detailed or requires the answers to many detailed questions, I may request an appointment to complete it more accurately.   I would like to see you back in the coming 3 to 6 weeks (may make an appointment for [name] at the same time).

## 2012-08-31 NOTE — Progress Notes (Signed)
  Subjective:    Patient ID: Vicki [name], female    DOB: 1961-05-18, 52 y.o.   MRN: [id number]  HPI Vicki [name] continues to have bilateral shoulder pain, R worse than left.  An MRI of the R shoulder on 11/04/2010 showed full-thickness infraspinatus tendon tear.  She has seen Dr Riley Kill in follow up in December, she reports that exercises are too painful to complete.  The meloxicam 7.5mg  twice daily is helpful to her.  However, she sustained a fall at home (slipped on carpet cleaner) and banged her R shoulder against a countertop on Thursday, Jan 16th.  Since then the R shoulder pain is worse, to the point of being unable to put a jug of milk on the counter or raise the R shoulder more than 30 degrees.  Handgrip and elbow flexion are preserved.   She recalls having had steroid injections in the R shoulder (last one estimated to have been about 1 year ago), with some minimal relief.  Note from Dr Riley Kill in July 2013 reviewed her injection history, she has had no further injections since that time.  She is applying for disability with a new lawyer, has a form that she will request that I complete for her lawyer.  She states that her low literacy and lack of GED makes it difficult for her to gain employment in anything customer-service related or in any field that requires writing or reading.    Review of Systems States her depression is reasonably controlled.  Legs feel uncomfortable at night when she takes the buspar; taking the buspar in the morning makes her feel "funny" during the day (unable to specify with more precision).  She does not drink alcohol.  Has been smoking 1-1/2 ppd cigarettes due to stress of grandsons and husband who is with home hospice for end-stage liver disease.  She continues with other meds as on med list.      Objective:   Physical Exam Alert, well appearing, no apparent distress HEENT Neck supple.  MSK: Unable to raise R shoulder more than 30 degrees; handgrip and hand  sensation grossly intact and symmetrical.  Unable to abduct or adduct R shoulder.  Full active ROM with R elbow flexion.  No appearance of R biceps tendon tear. Full pronation/supination R forearm.       Assessment & Plan:  PROCEDURE NOTE: Counseled patient on the risks and benefits of R SHOULDER SUBACROMIAL INJECTION for the purpose of acute pain relief. Counseled on risks and benefits, including risks of bleeding, injury or infection.  She agrees and gives verbal and written signed consent.  Posterior approach identified; area prepped with betadine.  Time-out taken.  Using 25-gauge 1-1/2 inch needle, injected 1cc of SoluMedrol 40mg /34mL and 3mL of Lidocaine WITHOUT epi into R subacromial bursa. Well tolerated, with immediate improvement in pain and limited improvement in active ROM. Tolerated well.  Vicki Compton, MD

## 2012-08-31 NOTE — Assessment & Plan Note (Signed)
Patient with known R rotator cuff tear and acute worsening of pain after recent fall at home.  I do not believe that her current worsening will likely improve with oral analgesia, and her ability to purchase medications is also hampered by her financial situation.  I have explained to her that a subacromial injection may provide short-term relief.  She is applying for disability and I will review the form her lawyer asks for her physician to complete.  Depending on the level of detail requested on the form, this may need to be completed during an office visit.  R subacromial bursa injection performed, see Procedure Note in the Progress Notes section of this visit note.

## 2012-08-31 NOTE — Assessment & Plan Note (Signed)
Will titrate down her buspar to 1/2 tab twice daily, to see if this helps her with perceived side effects.  She is wary of increasing her SSRI dose at this time, which would be another option.  Follow up in 4-6 weeks.

## 2012-09-01 ENCOUNTER — Other Ambulatory Visit: Payer: Self-pay | Admitting: Family Medicine

## 2012-09-04 ENCOUNTER — Telehealth: Payer: Self-pay | Admitting: Family Medicine

## 2012-09-04 NOTE — Telephone Encounter (Signed)
Disability paperwork to be completed by [facility]. Patient to pickup when completed.

## 2012-09-06 NOTE — Telephone Encounter (Signed)
Vicki [name], Can you call and schedule [name] an appointment to come in just to completed Disability Paperwork with Dr. Mauricio [name].  Thanks, Vicki [name]

## 2012-09-06 NOTE — Telephone Encounter (Signed)
I reviewed the forms that Luellen left for me to complete.  Due to the detailed nature of the forms, I request that she schedule an office visit expressly dedicated to the completion of the forms.  I have them in a light-green folder in my mailbox.  JB

## 2012-09-11 ENCOUNTER — Ambulatory Visit (INDEPENDENT_AMBULATORY_CARE_PROVIDER_SITE_OTHER): Payer: No Typology Code available for payment source | Admitting: Family Medicine

## 2012-09-11 ENCOUNTER — Encounter: Payer: Self-pay | Admitting: Family Medicine

## 2012-09-11 VITALS — BP 141/90 | HR 83 | Ht 63.0 in | Wt 161.0 lb

## 2012-09-11 DIAGNOSIS — M75101 Unspecified rotator cuff tear or rupture of right shoulder, not specified as traumatic: Secondary | ICD-10-CM

## 2012-09-11 DIAGNOSIS — F341 Dysthymic disorder: Secondary | ICD-10-CM

## 2012-09-11 DIAGNOSIS — F418 Other specified anxiety disorders: Secondary | ICD-10-CM

## 2012-09-11 DIAGNOSIS — S43429A Sprain of unspecified rotator cuff capsule, initial encounter: Secondary | ICD-10-CM

## 2012-09-12 NOTE — Assessment & Plan Note (Signed)
Continue current treatment with SSRI and buspirone.  She denies SI at this time.

## 2012-09-12 NOTE — Assessment & Plan Note (Addendum)
Patient forms for disability application completed during this visit.  She is given the orignal forms to submit to her lawyer. Given that the subacromial injection did not improve her pain at last visit, I have advised against trying further injections in the future.  May continue to use Mobic as she is doing.

## 2012-09-12 NOTE — Progress Notes (Signed)
  Subjective:    Patient ID: Vicki [name], female    DOB: 08-03-1961, 52 y.o.   MRN: [id number]  HPI Patient here for completion of [employer] Disability form and Residual Functional Capacity Questionnaire that she was given by the attorney representing her for her disability application.  Vicki [name] has right rotator cuff tear and left rotator cuff injury, with reports of pain that precede coming into my care in December, 2011.  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  She is the primary caregiver for her husband, who is currently in home-hospice care for end-stage liver disease.  She is also caring for her two grandsons in lieu of her daughter (grandsons' mother) due to social circumstances.  The physical, emotional and financial stresses of her current life situation have exacerbated Vicki [name]'s depression and anxiety as well.  She is currently adherent to her medications for this.  Regarding her shoulder pain, Vicki [name] reports that any immediate benefits she received from the R subacromial steroid injection done at last visit waned rapidly, and she is not feeling any benefit from that injection at this time.  I told her this indicates she is unlikely to benefit from injections in the future based upon this information.  In completing the [employer] Disability Form, Vicki [name] reports that she has had impairments in short-term memory, and she has to employ behaviors (such as extensive list-making) to remember appointments, go shopping, etc.  She has found herself calling doctor's office frequently to confirm appointments because she couldn't recall the details of date/time.   This did not impair her when she was working; however, she did have some run-ins with supervisors whom she thought did not have as high an expectation for her work as she did.  She cites an example in the summer when she had a temporary absence from work; when she came  back to work, the rooms for which she was responsible had been cleaned poorly and required additional work for [employer] to get back to their baseline condition.  [name] states she has had no problem adjusting to changes in the workplace, citing her ability to change her smoking habits after workplace rule changes as an example.   Vicki [name]'s greatest limitations are in the area of physical function. On exam today, she cannot lift R arm to less than 30 degrees below horizontal.  She lacks internal rotation of both left and right shoulders.  L shoulder raises to horizontal only.    Both forms completed in their entirety today, copies made to be scanned into her EMR record, and the originals returned to her for her to submit to her attorney.   Review of SystemsSee above     Objective:   Physical Exam On exam today, she cannot lift R arm to less than 30 degrees below horizontal.  She lacks internal rotation of both left and right shoulders.  L shoulder raises to horizontal only.         Assessment & Plan:

## 2012-10-02 ENCOUNTER — Ambulatory Visit: Payer: No Typology Code available for payment source | Admitting: Family Medicine

## 2012-10-05 ENCOUNTER — Other Ambulatory Visit: Payer: Self-pay | Admitting: Family Medicine

## 2012-10-31 ENCOUNTER — Telehealth: Payer: Self-pay | Admitting: Family Medicine

## 2012-10-31 NOTE — Telephone Encounter (Signed)
Forward to PCP for form

## 2012-10-31 NOTE — Telephone Encounter (Signed)
I received this form in the standard ([employer]) mail yesterday, I placed it for our admin staff to complete (fill in practice address and phone) and mail back in the self-addressed envelope that was enclosed.  I am presuming this was done yesterday. Please let Jesus know that this has been taken care of.  JB

## 2012-10-31 NOTE — Telephone Encounter (Signed)
The disability forms were faxed back on 3/12 because of missing information and they haven't gotten the forms back yet.  She is calling to check the status.

## 2012-11-01 NOTE — Telephone Encounter (Signed)
Advised pt that forms were taken care of per Dr Mauricio Po

## 2012-11-08 ENCOUNTER — Other Ambulatory Visit: Payer: Self-pay | Admitting: Family Medicine

## 2012-12-20 ENCOUNTER — Other Ambulatory Visit: Payer: Self-pay | Admitting: Family Medicine

## 2012-12-21 ENCOUNTER — Other Ambulatory Visit: Payer: Self-pay | Admitting: *Deleted

## 2012-12-21 ENCOUNTER — Encounter: Payer: Self-pay | Admitting: Family Medicine

## 2012-12-21 ENCOUNTER — Ambulatory Visit (INDEPENDENT_AMBULATORY_CARE_PROVIDER_SITE_OTHER): Payer: No Typology Code available for payment source | Admitting: Family Medicine

## 2012-12-21 VITALS — BP 106/76 | HR 98 | Temp 98.3°F | Ht 63.0 in | Wt 166.0 lb

## 2012-12-21 DIAGNOSIS — S43429A Sprain of unspecified rotator cuff capsule, initial encounter: Secondary | ICD-10-CM

## 2012-12-21 DIAGNOSIS — R0600 Dyspnea, unspecified: Secondary | ICD-10-CM

## 2012-12-21 DIAGNOSIS — R0609 Other forms of dyspnea: Secondary | ICD-10-CM

## 2012-12-21 DIAGNOSIS — M75101 Unspecified rotator cuff tear or rupture of right shoulder, not specified as traumatic: Secondary | ICD-10-CM

## 2012-12-21 DIAGNOSIS — F329 Major depressive disorder, single episode, unspecified: Secondary | ICD-10-CM

## 2012-12-21 LAB — CBC
MCH: 29.1 pg (ref 26.0–34.0)
MCHC: 34.3 g/dL (ref 30.0–36.0)
MCV: 84.7 fL (ref 78.0–100.0)
Platelets: 330 10*3/uL (ref 150–400)
RBC: 4.71 MIL/uL (ref 3.87–5.11)
RDW: 13.5 % (ref 11.5–15.5)

## 2012-12-21 LAB — BASIC METABOLIC PANEL
Calcium: 9.8 mg/dL (ref 8.4–10.5)
Creat: 0.57 mg/dL (ref 0.50–1.10)
Sodium: 137 mEq/L (ref 135–145)

## 2012-12-21 MED ORDER — ALBUTEROL SULFATE HFA 108 (90 BASE) MCG/ACT IN AERS: 2.0000 | INHALATION_SPRAY | RESPIRATORY_TRACT | Status: DC | PRN

## 2012-12-21 MED ORDER — MELOXICAM 7.5 MG PO TABS: 15.0000 mg | ORAL_TABLET | Freq: Every day | ORAL | Status: DC

## 2012-12-21 MED ORDER — BUSPIRONE HCL 10 MG PO TABS: 5.0000 mg | ORAL_TABLET | Freq: Two times a day (BID) | ORAL | Status: DC

## 2012-12-21 MED ORDER — PAROXETINE HCL 10 MG PO TABS: 10.0000 mg | ORAL_TABLET | ORAL | Status: DC

## 2012-12-21 NOTE — Assessment & Plan Note (Signed)
Given her longstanding smoking history, COPD and other smoking-related processes are at top of differential.  To get CXR and office Spirometry to start workup.  I am less concerned for cardiac-related causes in light of history.  I gave her a Rx for albuterol HFA with instructions to take to [place] MAP program; she expresses concern that she will not be able to afford even if only minimal charge such as $3.  For now she will limit exertion.  For close follow up.

## 2012-12-21 NOTE — Assessment & Plan Note (Signed)
Patient with chronic pain in right shoulder due to tear of right RTC.  I agree that her degree of limitation would make it untenable for her to work in any sort of position that required regular use of the right arm for manual tasks.  She has a very limited educational background/literacy, which makes it even harder for her to find a non-physical job to support herself and her husband.  I will await further written instructions/ requests for written support for her appeal for Disability application. Mobic for the pain for now.

## 2012-12-21 NOTE — Progress Notes (Signed)
  Subjective:    Patient ID: Vicki [name], female    DOB: 19-Apr-1961, 52 y.o.   MRN: [id number]  HPI Vicki [name] with complaint of gradually worsening dyspnea with even mild exertion (she gives example of walking 20 feet to take out the trash).  Some associated dry cough.  Will get better with rest.  No chest tightness or pressure, no diaphoresis or radiating discomfort or pain.  She continues to smoke as she has in the past.  Denies fevers or chills, no hemoptysis.  No lower extremity swelling.   Has been unable to work due to the severe pain in her R arm, which in turn is caused by rotator cuff tear.  This is a chronic problem. 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  HOwever, cannot function at home with the right arm due to pain (gives example of using the R shoulder to push bedroom door shut rather than use her hand/arm to pull/push the door closed).  Mobic takes the edge off.   Feeling depressed, exacerbated by financial stresses of not having work, of taking care of husband on hospice for liver failure diagnosis, and recently learning that her application for disability was rejected.  She asks for a letter from me to attest to her inability to work with her R arm.  I have asked her to have her lawyer or other representative give written detail of the medical information that is needed in order to further her application.     Review of Systems See above     Objective:   Physical Exam Alert and oriented by with somewhat flat affect.  HEENT Neck supple. No cervical adenopathy. Clear oropharynx.  COR regular S1S2 PULM prolonged expiratory phase but without rales or wheezes.  ABD Soft, nontender.  EXTS: No lower extremity edema noted in legs bilaterally.  MSK: unable to raise R arm to within 30 degrees of horizontal with active ROM.        Assessment & Plan:

## 2012-12-21 NOTE — Assessment & Plan Note (Signed)
Modest improvement before with paxil; she had complained of somnolence with even minimally higher doses.  I suspect her husband's worsening on hospice aswell as the financial pressures of being without work are making this problem much worse.  Again, close follow up. She is resistant to the idea of increasing the dose of medicine.  PHQ9 at next visit.

## 2012-12-21 NOTE — Patient Instructions (Signed)
It is a pleasure to see you today.  For your shortness of breath, I am most suspicious of lung disease (COPD or emphysema) related to smoking.  Stopping smoking is the best thing we can do for your long-term health.  1-800-QUIT-NOW  I would like to get an x-ray of the chest at your earliest convenience.  I would like you to take the paper prescription for ALBUTEROL puffer to the [facility] ([address] [place]) for the [employer]   I would like you to schedule for lung function testing (Spirometry) with our Pharm D clinic, and to see me after that visit.   Lab work today; I will call you next week with the results [phone]) cell phone.

## 2012-12-24 ENCOUNTER — Encounter: Payer: Self-pay | Admitting: Family Medicine

## 2012-12-26 ENCOUNTER — Ambulatory Visit (HOSPITAL_COMMUNITY)
Admission: RE | Admit: 2012-12-26 | Discharge: 2012-12-26 | Disposition: A | Discharge status: Home / Self Care | Payer: No Typology Code available for payment source | Source: Ambulatory Visit | Attending: Family Medicine | Admitting: Family Medicine

## 2012-12-26 DIAGNOSIS — R0602 Shortness of breath: Secondary | ICD-10-CM | POA: Insufficient documentation

## 2012-12-26 DIAGNOSIS — F17200 Nicotine dependence, unspecified, uncomplicated: Secondary | ICD-10-CM | POA: Insufficient documentation

## 2012-12-26 DIAGNOSIS — R0600 Dyspnea, unspecified: Secondary | ICD-10-CM

## 2012-12-27 ENCOUNTER — Telehealth: Payer: Self-pay | Admitting: Family Medicine

## 2012-12-27 NOTE — Telephone Encounter (Signed)
Called to her cell phone [phone]) and notified of CXR results.  Shortness of breath may be mildly better.  She did not schedule for PharmD clinic for Spirometry; will forward to Admin staff to see if she can be scheduled with PharmD clinic for Spirometry, may benefit from Smoking Cessation discussion as well. JB

## 2013-01-11 ENCOUNTER — Ambulatory Visit (INDEPENDENT_AMBULATORY_CARE_PROVIDER_SITE_OTHER): Payer: No Typology Code available for payment source | Admitting: Pharmacist

## 2013-01-11 ENCOUNTER — Encounter: Payer: Self-pay | Admitting: Pharmacist

## 2013-01-11 VITALS — BP 133/87 | HR 89 | Ht 66.0 in | Wt 166.0 lb

## 2013-01-11 DIAGNOSIS — F17200 Nicotine dependence, unspecified, uncomplicated: Secondary | ICD-10-CM

## 2013-01-11 DIAGNOSIS — R0600 Dyspnea, unspecified: Secondary | ICD-10-CM

## 2013-01-11 DIAGNOSIS — R0989 Other specified symptoms and signs involving the circulatory and respiratory systems: Secondary | ICD-10-CM

## 2013-01-11 NOTE — Assessment & Plan Note (Signed)
Spirometry evaluation with Pre and Post Bronchodilator reveals near normal lung function.  Patient has been experiencing dyspnea with mild exertion and dry cough and taking albuterol daily. Reviewed results of pulmonary function tests including lung age of 64 years. Pt verbalized understanding of results.  Continue current treatment plan at this time. Encouraged patient to continue tapering on cigarettes. Short term goal is to be down to 12 cigarettes by July. Written pt instructions provided.  F/U Clinic visit with Dr. Mauricio Po PRN.  Total time in face to face counseling 30 minutes.  Patient seen with Brigid Re, PharmD Resident

## 2013-01-11 NOTE — Assessment & Plan Note (Signed)
Spirometry evaluation with Pre and Post Bronchodilator reveals near normal lung function.  Patient has been experiencing dyspnea with mild exertion and dry cough and taking albuterol daily. Reviewed results of pulmonary function tests including lung age of 70 years. Pt verbalized understanding of results.  Continue current treatment plan at this time. Encouraged patient to continue tapering on cigarettes. Short term goal is to be down to 12 cigarettes by July. Written pt instructions provided.  F/U Clinic visit with Dr. Breen PRN.  Total time in face to face counseling 30 minutes.  Patient seen with Lauren Bajbus, PharmD Resident 

## 2013-01-11 NOTE — Progress Notes (Signed)
  Subjective:    Patient ID: Vicki [name], female    DOB: July 05, 1961, 52 y.o.   MRN: [id number]  HPI  Patient arrives for lung function testing. Reports having performed this ~20 years ago at which time she was told to stop smoking. Denies lung infections or use of burst systemic steroids. Reports use of daily albuterol inhaler.  Age when started using tobacco on a daily basis 8. Number of Cigarettes per day 15 currently (max 1.5 packs). Most recent quit attempt patient reports quitting around illness only Longest time ever been tobacco free see above. What Medications (NRT, bupropion, varenicline) used in past includes none Rates IMPORTANCE of quitting tobacco on 1-10 scale of patient's husband is with palliative care with liver failure. Life stressors are high at this time. Triggers to use tobacco include; anxiety and family stress/family smokers    Review of Systems     Objective:   Physical Exam   See Documentation Flowsheet (discrete results - PFTs) for complete Spirometry results. Patient provided good effort while attempting spirometry.   Lung Age = 67      Assessment & Plan:   Spirometry evaluation with Pre and Post Bronchodilator reveals near normal lung function.  Patient has been experiencing dyspnea with mild exertion and dry cough and taking albuterol daily. Reviewed results of pulmonary function tests including lung age of 31 years. Pt verbalized understanding of results.  Continue current treatment plan at this time. Encouraged patient to continue tapering on cigarettes. Short term goal is to be down to 12 cigarettes by July. Written pt instructions provided.  F/U Clinic visit with Dr. Mauricio Po PRN.  Total time in face to face counseling 30 minutes.  Patient seen with Brigid Re, PharmD Resident

## 2013-01-11 NOTE — Patient Instructions (Addendum)
It was a pleasure to meet you today  Congratulations on cutting down on smoking.   Lung function tests today showed near normal function with a lung age of 31.  Continue to try and cut back on cigarettes. If you think you need help, someone in clinic would be happy to see you.  Follow up with Dr. Mauricio Po

## 2013-01-14 NOTE — Progress Notes (Signed)
Patient ID: Vicki [name], female   DOB: 07-26-1961, 52 y.o.   MRN: [id number] Reviewed: Agree with Dr. Macky Lower documentation and management.

## 2013-02-01 ENCOUNTER — Ambulatory Visit (HOSPITAL_COMMUNITY)
Admission: RE | Admit: 2013-02-01 | Discharge: 2013-02-01 | Disposition: A | Discharge status: Home / Self Care | Payer: Self-pay | Source: Ambulatory Visit | Attending: Family Medicine | Admitting: Family Medicine

## 2013-02-01 ENCOUNTER — Encounter: Payer: Self-pay | Admitting: Family Medicine

## 2013-02-01 ENCOUNTER — Other Ambulatory Visit: Payer: Self-pay

## 2013-02-01 ENCOUNTER — Ambulatory Visit (INDEPENDENT_AMBULATORY_CARE_PROVIDER_SITE_OTHER): Payer: Self-pay | Admitting: Family Medicine

## 2013-02-01 VITALS — BP 132/87 | HR 82 | Ht 66.0 in | Wt 166.3 lb

## 2013-02-01 DIAGNOSIS — R0989 Other specified symptoms and signs involving the circulatory and respiratory systems: Secondary | ICD-10-CM | POA: Insufficient documentation

## 2013-02-01 DIAGNOSIS — R0609 Other forms of dyspnea: Secondary | ICD-10-CM | POA: Insufficient documentation

## 2013-02-01 DIAGNOSIS — R42 Dizziness and giddiness: Secondary | ICD-10-CM | POA: Insufficient documentation

## 2013-02-01 DIAGNOSIS — R0602 Shortness of breath: Secondary | ICD-10-CM | POA: Insufficient documentation

## 2013-02-01 NOTE — Patient Instructions (Addendum)
It was a pleasure to see you today.  I am ordering an ECG today, as well as a referral to Cardiology to evaluate for possible heart-related causes of your shortness of breath.   As we discussed, I am glad you are cutting down on your smoking.  This is the best change you can make to improve your health.   I would like to see you back in 4 to 6 weeks, or sooner if needed.

## 2013-02-01 NOTE — Assessment & Plan Note (Signed)
Dyspnea without diaphoresis or chest discomfort, no nausea.  Spirometry not consistent with obstructive lung disease, non-reversible. Pulse ox 96% on room air with exertion in our office (February 01, 2013). For ECG today and likely evaluation to exclude ischemic heart disease.  Had ECHO in 2008, reviewed in EPIC.

## 2013-02-01 NOTE — Progress Notes (Signed)
  Subjective:    Patient ID: Vicki [name], female    DOB: Jun 04, 1961, 52 y.o.   MRN: [id number]  HPI Laurin comes in today for follow up for her dyspnea, which she states is relatively unchanged from last visit.  We discussed her Spirometry results, which she had discussed with Dr Raymondo Band before.  She gets short of breath walking to mailbox, also climbing one flight of steps in her house.  Does not get chest pressure/discomfort, no diaphoresis or arm pain, no nausea.  Has felt a little dizzy when she gets short of breath.    Her ambulatory pulse oximetry in the office today is 96% on room air.   PMHx: Had previously had palpitations and had workup for this, including ECHO.  Report for ECHO from 2008 with LVEF 50-55% and without wall motion abnormalities.  She has managed to cut back on her cigarette use.  Smoked 1-3ppd cigarettes since she was 52 years old.   Social Hx; She is now taking care of her husband (home hospice, alcoholic liver disease) and her sister, who moved up from [facility] last Friday and has many medical issues including crack cocaine addiction and urinary incontinence.    Review of Systems Dyspnea as above. No chest pain; no palpitations.  No cough.      Objective:   Physical Exam Well appearing, speaking fluidly in full sentences.  HEENT Neck supple.  PULM Clear bilaterally, no rales or wheezes  COR Regular S1S2 EXTS no edema in ankles.       Assessment & Plan:

## 2013-02-28 ENCOUNTER — Ambulatory Visit: Payer: Self-pay | Admitting: Family Medicine

## 2013-02-28 ENCOUNTER — Telehealth: Payer: Self-pay | Admitting: Family Medicine

## 2013-02-28 NOTE — Telephone Encounter (Signed)
Vicki [name] was here for her re cert of OC and wanted to thank you for the phone call the other day.  She was very appreciative and surprised.  Was very helpful in her getting through the rest of her day.

## 2013-04-02 ENCOUNTER — Encounter: Payer: Self-pay | Admitting: Family Medicine

## 2013-04-02 ENCOUNTER — Ambulatory Visit (INDEPENDENT_AMBULATORY_CARE_PROVIDER_SITE_OTHER): Payer: No Typology Code available for payment source | Admitting: Family Medicine

## 2013-04-02 ENCOUNTER — Other Ambulatory Visit: Payer: Self-pay | Admitting: *Deleted

## 2013-04-02 VITALS — BP 129/88 | HR 80 | Ht 66.0 in | Wt 158.0 lb

## 2013-04-02 DIAGNOSIS — F4321 Adjustment disorder with depressed mood: Secondary | ICD-10-CM

## 2013-04-02 DIAGNOSIS — R3911 Hesitancy of micturition: Secondary | ICD-10-CM | POA: Insufficient documentation

## 2013-04-02 DIAGNOSIS — F341 Dysthymic disorder: Secondary | ICD-10-CM

## 2013-04-02 DIAGNOSIS — F418 Other specified anxiety disorders: Secondary | ICD-10-CM

## 2013-04-02 LAB — POCT URINALYSIS DIPSTICK
Blood, UA: NEGATIVE
Ketones, UA: NEGATIVE
Protein, UA: NEGATIVE
Spec Grav, UA: 1.01
Urobilinogen, UA: 0.2
pH, UA: 7

## 2013-04-02 MED ORDER — MELOXICAM 7.5 MG PO TABS: 15.0000 mg | ORAL_TABLET | Freq: Every day | ORAL | Status: DC

## 2013-04-02 MED ORDER — LORAZEPAM 0.5 MG PO TABS: 0.5000 mg | ORAL_TABLET | Freq: Two times a day (BID) | ORAL | Status: DC | PRN

## 2013-04-02 NOTE — Patient Instructions (Addendum)
It was a pleasure to see you.  I am glad to hear you are going to Mental Health next week.   For your sleep problems in the wake of [name] passing, I am giving you a prescription for a medicine called Ativan 0.5mg .  Take 1 tablet at bedtime as-needed to help with sleep.  I do not intend for this to be a regular medication for you.  Please bring this medicine (and your other medicines) to the Mental Health appointment next week.  Please keep your appointment with Cardiology next week (Sept 3rd).   I would like to see you back in 1 month, or sooner as needed.

## 2013-04-03 NOTE — Progress Notes (Signed)
  Subjective:    Patient ID: Vicki [name], female    DOB: 06-26-1961, 52 y.o.   MRN: [id number]  HPI Vicki [name] is here for follow-up.  This is her first visit with me since the death of her husband, Vicki [name] (who died from end-stage liver disease due to alcoholism).  Vicki [name] reports feeling very "stressed-- like I'm gonna have a stroke", which she relates to repetitive thoughts about how she could have prevented Vicki [name]'s death.  She says her youngest son keeps telling her that she "killed his father" by supplying him with alcohol over the course of many years (though not after he was diagnosed with liver disease).  Vicki [name] states that she did this because "he would get really sick without it, and even would hit me in the head if I didn't get it for him".  She is seeing a Veterinary surgeon from hospice, in which Vicki [name] was enrolled when he died.  She says this is somewhat helpful, but she believes she needs more intervention.  She and her daughter are going together to [facility] Health [place] next week for evaluation (daughter has bipolar disorder).   Vicki [name] has had her daughter, son-in-law and their two sons (ages 92 and 48), as well as Vicki [name]'s own sister (moved from [place], has CHF and former crack addiction) in her home now.  Recently she was turned down for disability again, is planning appeal.  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   Vicki [name] continues to smoke 1ppd cigarettes, no alcohol or drugs.  No one in her household now is an active substance abuser/user.  Vicki [name] reports feeling depressed but denies any thoughts of self-harm.   Review of Systems See above.  Difficulty sleeping since Vicki [name]'s death. Sleeps in living room, because he died in their bedroom and she can't imagine sleeping in the bed where he died.      Objective:   Physical  Exam Well-appearing, flat affect, no apparent distress.        Assessment & Plan:

## 2013-04-03 NOTE — Assessment & Plan Note (Signed)
Vicki [name] suffers from chronic depression with anxiety, for which she has taken low-dose Paxil 10mg  daily (had gone up on the dose, which she did not tolerate due to "leg cramping").  When she initially became my patient she had been using other people's alprazolam for anxiety, but she stopped this practice at my request when I began treating her.  Acute worsening of her depression/anxiety due to grief reaction at this time, for which I am giving her a limited course of lorazepam to be used at bedtime as-needed.  I offered to refer her back to Dr Pascal Lux in our [facility], Vicki [name] has already taken the initiative to make arrangements to be seen at Mental Health (?[facility]) next week with her daughter, who will also be evaluated.  She seems intent on following through on this.  No SI or HI at this visit.  Will plan to see back after her Mental Health visit to reassess.  She is continuing with hospice counselor as well.  I re-emphasized with Vicki [name] that she is not responsible for [name] death.

## 2013-04-09 ENCOUNTER — Encounter: Payer: Self-pay | Admitting: *Deleted

## 2013-04-10 ENCOUNTER — Institutional Professional Consult (permissible substitution): Payer: Self-pay | Admitting: Cardiovascular Disease

## 2013-04-10 ENCOUNTER — Other Ambulatory Visit (HOSPITAL_COMMUNITY): Payer: Self-pay | Admitting: Internal Medicine

## 2013-04-10 DIAGNOSIS — Z1231 Encounter for screening mammogram for malignant neoplasm of breast: Secondary | ICD-10-CM

## 2013-04-16 ENCOUNTER — Telehealth: Payer: Self-pay | Admitting: Family Medicine

## 2013-04-17 ENCOUNTER — Other Ambulatory Visit: Payer: Self-pay | Admitting: Family Medicine

## 2013-04-17 NOTE — Telephone Encounter (Signed)
Patient last seen on Aug 26th, at which time I prescribed lorazepam for acute grief reaction (patient's husband recently died).  She was intent on seeking mental health care from [facility] and was planning to go for consult there the week following our visit here.  I decline to refill this BNZ prescription, as I had stated to her that it was intended to help with sleep during her period of grieving.  Will request records from [facility] regarding her current plan of care for depression/anxiety.  Paula Compton, MD

## 2013-04-17 NOTE — Telephone Encounter (Signed)
Pt called and is requesting a refill on Lorazepam to be picked up . JW

## 2013-04-18 NOTE — Telephone Encounter (Signed)
Will forward refill request to MD.   Radene Ou, CMA

## 2013-05-07 ENCOUNTER — Encounter: Payer: Self-pay | Admitting: Cardiovascular Disease

## 2013-05-07 ENCOUNTER — Ambulatory Visit (INDEPENDENT_AMBULATORY_CARE_PROVIDER_SITE_OTHER): Payer: No Typology Code available for payment source | Admitting: Cardiovascular Disease

## 2013-05-07 VITALS — BP 140/82 | HR 62 | Ht 66.0 in | Wt 159.0 lb

## 2013-05-07 DIAGNOSIS — F17200 Nicotine dependence, unspecified, uncomplicated: Secondary | ICD-10-CM

## 2013-05-07 DIAGNOSIS — R0609 Other forms of dyspnea: Secondary | ICD-10-CM

## 2013-05-07 DIAGNOSIS — R0600 Dyspnea, unspecified: Secondary | ICD-10-CM

## 2013-05-07 NOTE — Progress Notes (Signed)
HPI  This is a 52 year old female who was referred by Dr. Mauricio Po for evaluation of exertional dyspnea. She has no previous cardiac history. She had an echocardiogram a few years ago which was unremarkable. She has prolonged history of tobacco use with possible COPD. However, recent spirometry was not very impressive. She has no history of hypertension, diabetes or hyperlipidemia. There is family history of coronary artery disease but not prematurely. She has been having progressive symptoms of dyspnea with minimal activities without chest discomfort, diaphoresis or syncope. She does get dizzy with exercise. She has been under increased stress lately related to the death of her husband who died from end stage liver disease related to alcohol abuse.  No Known Allergies   Current Outpatient Prescriptions on File Prior to Visit  Medication Sig Dispense Refill  . albuterol (PROVENTIL HFA;VENTOLIN HFA) 108 (90 BASE) MCG/ACT inhaler Inhale 2 puffs into the lungs every 4 (four) hours as needed for wheezing.  1 Inhaler  4  . busPIRone (BUSPAR) 10 MG tablet Take 0.5 tablets (5 mg total) by mouth 2 (two) times daily.  60 tablet  4  . LORazepam (ATIVAN) 0.5 MG tablet Take 1 tablet (0.5 mg total) by mouth 2 (two) times daily as needed for anxiety.  30 tablet  0  . meloxicam (MOBIC) 7.5 MG tablet Take 2 tablets (15 mg total) by mouth daily.  60 tablet  2  . PARoxetine (PAXIL) 10 MG tablet Take 1 tablet (10 mg total) by mouth every morning.  30 tablet  6   No current facility-administered medications on file prior to visit.     Past Medical History  Diagnosis Date  . DEPRESSIVE DISORDER NOT ELSEWHERE CLASSIFIED   . SHOULDER PAIN, RIGHT   . Rotator cuff tear, right   . Sleep disorder   . Acquired hallux valgus of left foot   . Onychomycosis   . Foot deformity, acquired   . Callous ulcer   . Myalgia and myositis, unspecified   . Rotator cuff syndrome of left shoulder   . Depression with anxiety     . Oral thrush   . Dyspnea   . Shortness of breath   . Urinary hesitancy   . Grief reaction      Past Surgical History  Procedure Laterality Date  . Tubal ligation       Family History  Problem Relation Age of Onset  . Heart disease Mother   . Diabetes Mother   . Cancer Mother   . Heart disease Father      History   Social History  . Marital Status: Married    Spouse Name: N/A    Number of Children: N/A  . Years of Education: N/A   Occupational History  . Not on file.   Social History Main Topics  . Smoking status: Current Every Day Smoker -- 0.75 packs/day    Types: Cigarettes  . Smokeless tobacco: Never Used     Comment: decreased smoking  . Alcohol Use: Not on file  . Drug Use: Not on file  . Sexual Activity: Not on file   Other Topics Concern  . Not on file   Social History Narrative  . No narrative on file     ROS A 10 point review of system was performed. It is negative other than mentioned in history of present illness.  PHYSICAL EXAM   BP 140/82  Pulse 62  Ht 5\' 6"  (1.676 m)  Wt 159 lb (  72.122 kg)  BMI 25.68 kg/m2 Constitutional: She is oriented to person, place, and time. She appears well-developed and well-nourished. No distress.  HENT: No nasal discharge.  Head: Normocephalic and atraumatic.  Eyes: Pupils are equal and round. Right eye exhibits no discharge. Left eye exhibits no discharge.  Neck: Normal range of motion. Neck supple. No JVD present. No thyromegaly present.  Cardiovascular: Normal rate, regular rhythm, normal heart sounds. Exam reveals no gallop and no friction rub. No murmur heard.  Pulmonary/Chest: Effort normal and diminished breath sounds. No stridor. No respiratory distress. She has no wheezes. She has no rales. She exhibits no tenderness.  Abdominal: Soft. Bowel sounds are normal. She exhibits no distension. There is no tenderness. There is no rebound and no guarding.  Musculoskeletal: Normal range of motion. She  exhibits no edema and no tenderness.  Neurological: She is alert and oriented to person, place, and time. Coordination normal.  Skin: Skin is warm and dry. No rash noted. She is not diaphoretic. No erythema. No pallor.  Psychiatric: She has a normal mood and affect. Her behavior is normal. Judgment and thought content normal.     EKG: Recent EKG was reviewed which showed normal sinus rhythm.   ASSESSMENT AND PLAN

## 2013-05-07 NOTE — Patient Instructions (Addendum)
Your physician has requested that you have an exercise tolerance test.  Please also follow instruction sheet, as given.  Your physician has requested that you have an echocardiogram. Echocardiography is a painless test that uses sound waves to create images of your heart. It provides your doctor with information about the size and shape of your heart and how well your heart's chambers and valves are working. This procedure takes approximately one hour. There are no restrictions for this procedure.  Your physician recommends that you schedule a follow-up appointment in: as needed per

## 2013-05-07 NOTE — Assessment & Plan Note (Signed)
I discussed with him the importance of smoking cessation. She does not think she can do this at present time due to significant stress.

## 2013-05-07 NOTE — Assessment & Plan Note (Signed)
The patient has significant exertional dyspnea without chest pain which could be related to prolonged tobacco use and physical deconditioning. However, given her risk factors for coronary artery disease, I agree with ischemic cardiac evaluation. I requested a treadmill stress test. I will also request an echocardiogram to evaluate LV systolic/diastolic function , valvular structure and pulmonary pressure.

## 2013-05-08 ENCOUNTER — Ambulatory Visit (HOSPITAL_COMMUNITY)
Admission: RE | Admit: 2013-05-08 | Discharge: 2013-05-08 | Disposition: A | Discharge status: Home / Self Care | Payer: No Typology Code available for payment source | Source: Ambulatory Visit | Attending: Internal Medicine | Admitting: Internal Medicine

## 2013-05-08 DIAGNOSIS — Z1231 Encounter for screening mammogram for malignant neoplasm of breast: Secondary | ICD-10-CM | POA: Insufficient documentation

## 2013-05-13 ENCOUNTER — Institutional Professional Consult (permissible substitution): Payer: Self-pay | Admitting: Cardiovascular Disease

## 2013-05-17 ENCOUNTER — Ambulatory Visit (HOSPITAL_COMMUNITY): Payer: No Typology Code available for payment source | Attending: Cardiovascular Disease | Admitting: Radiology

## 2013-05-17 ENCOUNTER — Other Ambulatory Visit (HOSPITAL_COMMUNITY): Payer: Self-pay | Admitting: Cardiovascular Disease

## 2013-05-17 DIAGNOSIS — R0602 Shortness of breath: Secondary | ICD-10-CM | POA: Insufficient documentation

## 2013-05-17 DIAGNOSIS — R0600 Dyspnea, unspecified: Secondary | ICD-10-CM

## 2013-05-17 DIAGNOSIS — R0989 Other specified symptoms and signs involving the circulatory and respiratory systems: Secondary | ICD-10-CM | POA: Insufficient documentation

## 2013-05-17 DIAGNOSIS — R0609 Other forms of dyspnea: Secondary | ICD-10-CM | POA: Insufficient documentation

## 2013-05-17 NOTE — Progress Notes (Signed)
Echocardiogram performed.  

## 2013-05-23 ENCOUNTER — Telehealth: Payer: Self-pay | Admitting: Family Medicine

## 2013-05-23 NOTE — Telephone Encounter (Signed)
Called Vicki [name]'s mobile phone, left voice message to let her know I had seen the results of her ECHO and the Cardiology note from her recent visit there.  JB

## 2013-05-23 NOTE — Telephone Encounter (Signed)
Message copied by Barbaraann Barthel on Thu May 23, 2013  9:48 AM ------      Message from: Lorine Bears A      Created: Tue May 21, 2013  8:19 AM       Inform patient that echo was fine. Normal EF with no significant abnormalities. ------

## 2013-05-28 ENCOUNTER — Ambulatory Visit (INDEPENDENT_AMBULATORY_CARE_PROVIDER_SITE_OTHER): Payer: No Typology Code available for payment source | Admitting: Family Medicine

## 2013-05-28 ENCOUNTER — Encounter: Payer: Self-pay | Admitting: Family Medicine

## 2013-05-28 VITALS — BP 130/80 | HR 84 | Ht 66.0 in | Wt 159.3 lb

## 2013-05-28 DIAGNOSIS — M5430 Sciatica, unspecified side: Secondary | ICD-10-CM | POA: Insufficient documentation

## 2013-05-28 DIAGNOSIS — F329 Major depressive disorder, single episode, unspecified: Secondary | ICD-10-CM

## 2013-05-28 DIAGNOSIS — F17200 Nicotine dependence, unspecified, uncomplicated: Secondary | ICD-10-CM

## 2013-05-28 MED ORDER — PREDNISONE 20 MG PO TABS: 20.0000 mg | ORAL_TABLET | Freq: Every day | ORAL | Status: DC

## 2013-05-28 NOTE — Progress Notes (Signed)
  Subjective:    Patient ID: Vicki [name], female    DOB: 26-May-1961, 52 y.o.   MRN: [id number]  HPI Patient seen in follow up.  She has several complaints today, among them her depression and sleep difficulties.  She had been given a limited supply of Ativan for grief reaction at the time of her late husband Vicki [name]'s death, which she says helped her to sleep. She was seen by [facility] for her depression, has another appointment this afternoon at 2:40 and again at 4pm there.  She had been put on Effexor XR 75mg  once daily and titrated to twice daily, but says she stopped after a few days because of Headache which she attributed to the medicine.  She has been taken off Buspar, but still has some left that she has been taking.  Is still taking the Paxil 10mg  daily that I had prescribed her before.  Describes anhedonia, doesn't want to leave house or put on makeup.  Denies SI today when asked.   A lot of change in her home situation.  Daughter and two rambunctious grandsons have moved out; Vicki [name]'s son got out of prison and is living with Vicki [name], along with his 22 yr old daughter with Cerebral Palsy who requires tube feeds.  She goes to [employer], but has been out of school due to a respiratory illness.  Also at home is Vicki [name]'s sister, who has several medical problems and whom Vicki [name] takes care of.    Vicki [name] has been having a cough with green non-bloody sputum for 3 days. No fevers, no chills, no shortness of breath beyond her baseline.  She has continued to smoke 1ppd.  Attending smoking cessation classes at [facility], but doesn't find them helpful for quitting.  Says her cough and congestion is better today than previously.  Also complains of pain down the back of both legs, L worse than R> Pain is worse at night; sometimes gets a charley horse that she has to stretch out.  Upon ROS, she says the pain may be worse with prolonged walking.  Extends from low back through gluteus and down posterior  aspect of legs to just below knees (popliteals).  No swelling of legs. No weakness.  No falls or trauma.    Review of Systems See above.    Objective:   Physical Exam Well appearing, no apparent distress. Flat affect.  HEENT Neck supple. No cervical adenopathy. Clear oropharynx. Clear TMs. No maxillary or frontal sinus tenderness.  COR regular S1S2 PULM Clear without rales or wheezes. Good air movement.  NEURO> SLR positive at 30 degrees on L; 45 degrees on R. Full strength with hip/knee flexion and extension, sensation in lower extremities is full and intact.  Gait unremarkable. No skin lesions on lower back or palpable vertebral tenderness on exam or T/L/S spine.        Assessment & Plan:

## 2013-05-28 NOTE — Patient Instructions (Signed)
It was a pleasure to see you today.  I believe your buttock/leg pain is due to a condition called sciatica.   I sent a prescription to Prednisone 20mg  tablets, one tablet daily for 7 days.  Stretching; heat packs to low back.  I am glad you are seeing the doctors at [facility] for your depression; please mention your sleep problems to them.   I am glad your cough is getting better.  Please let me know if your cough persists for more than 2 weeks, if you have blood in your sputum, if you have fevers, or if you have any shortness of breath.   Start taking a baby aspirin (81mg ) one time daily.  FOLLOW UP IN 4 WEEKS.   Sciatica Sciatica is pain, weakness, numbness, or tingling along your sciatic nerve. The nerve starts in the lower back and runs down the back of each leg. Nerve damage or certain conditions pinch or put pressure on the sciatic nerve. This causes the pain, weakness, and other discomforts of sciatica. HOME CARE   Only take medicine as told by your doctor.  Apply ice to the affected area for 20 minutes. Do this 3 4 times a day for the first 48 72 hours. Then try heat in the same way.  Exercise, stretch, or do your usual activities if these do not make your pain worse.  Go to physical therapy as told by your doctor.  Keep all doctor visits as told.  Do not wear high heels or shoes that are not supportive.  Get a firm mattress if your mattress is too soft to lessen pain and discomfort. GET HELP RIGHT AWAY IF:   You cannot control when you poop (bowel movement) or pee (urinate).  You have more weakness in your lower back, lower belly (pelvis), butt (buttocks), or legs.  You have redness or puffiness (swelling) of your back.  You have a burning feeling when you pee.  You have pain that gets worse when you lie down.  You have pain that wakes you from your sleep.  Your pain is worse than past pain.  Your pain lasts longer than 4 weeks.  You are suddenly losing weight  without reason. MAKE SURE YOU:   Understand these instructions.  Will watch this condition.  Will get help right away if you are not doing well or get worse. Document Released: 05/03/2008 Document Revised: 01/24/2012 Document Reviewed: 12/04/2011 [facility] Patient Information 2014 [place], [place].

## 2013-05-28 NOTE — Assessment & Plan Note (Signed)
Discussed smoking cessation.  [facility]

## 2013-05-28 NOTE — Assessment & Plan Note (Signed)
Depression, which appears to lack adequate control given her anhedonia and sleep disorder.  She is seeing Vicki [name] today; will await further recs from psychiatry.  Discussed my reticence to give BNZ without the approval of Jule's psychiatrist.  She does not appear to be at risk of self harm today.

## 2013-05-28 NOTE — Assessment & Plan Note (Signed)
Bilateral, left worse than right.  Positive SLR test (L to 30 degrees, R to 45 degrees).  Trial prednisone 20mg  daily for 7 days.

## 2013-06-03 ENCOUNTER — Ambulatory Visit (INDEPENDENT_AMBULATORY_CARE_PROVIDER_SITE_OTHER): Payer: No Typology Code available for payment source | Admitting: Nurse Practitioner

## 2013-06-03 VITALS — BP 135/91 | HR 100

## 2013-06-03 DIAGNOSIS — R0609 Other forms of dyspnea: Secondary | ICD-10-CM

## 2013-06-03 DIAGNOSIS — R0600 Dyspnea, unspecified: Secondary | ICD-10-CM

## 2013-06-03 NOTE — Progress Notes (Signed)
Exercise Treadmill Test  Pre-Exercise Testing Evaluation Rhythm: normal sinus  Rate: 84     Test  Exercise Tolerance Test Ordering MD: Kirke Corin  Interpreting MD: Norma Fredrickson, NP  Unique Test No: 1  Treadmill:  1  Indication for ETT: SOB  Contraindication to ETT: No   Stress Modality: exercise - treadmill  Cardiac Imaging Performed: non   Protocol: standard Bruce - maximal  Max BP:  174/69  Max MPHR (bpm):  167 85% MPR (bpm):  142  MPHR obtained (bpm):  152 % MPHR obtained:  91%  Reached 85% MPHR (min:sec):  3:06 Total Exercise Time (min-sec):  6 minutes  Workload in METS:  7.0 Borg Scale: 14  Reason ETT Terminated:  dyspnea    ST Segment Analysis At Rest: normal ST segments - no evidence of significant ST depression With Exercise: no evidence of significant ST depression  Other Information Arrhythmia:  No Angina during ETT:  absent (0) Quality of ETT:  diagnostic  ETT Interpretation:  normal - no evidence of ischemia by ST analysis  Comments: Patient presents today for routine GXT. Has had dyspnea and palpitations - ongoing tobacco abuse - no known CAD.   Today the patient exercised on the standard Bruce protocol for a total of 6 minutes.  Reduced exercise tolerance.  Adequate blood pressure response.  Clinically negative for chest pain. Test was stopped due to dyspnea/fatigue.  EKG negative for ischemia. No significant arrhythmia noted.   Recommendations: CV risk factor modification with regular exercise and smoking cessation.  See back prn.   Patient is agreeable to this plan and will call if any problems develop in the interim.   Rosalio Macadamia, RN, ANP-C [facility] [address] [place], [place]  [address]

## 2013-06-07 ENCOUNTER — Telehealth: Payer: Self-pay | Admitting: Family Medicine

## 2013-06-07 NOTE — Telephone Encounter (Signed)
Called pt.

## 2013-06-07 NOTE — Telephone Encounter (Signed)
Patient dropped off disability forms to be filled out.  Please call her when completed.   °

## 2013-06-07 NOTE — Telephone Encounter (Signed)
Called pt back after looking at the disability form. It has many pages and requires an OV to be filled out. Pt agreed and will schedule OV. Lorenda Hatchet, Renato Battles

## 2013-06-07 NOTE — Telephone Encounter (Signed)
Patient needs appointment to complete paperwork. Vicki [name]

## 2013-06-07 NOTE — Telephone Encounter (Signed)
Called pt and she reports, that Dr.Breen has filled out the form before. It is for her lawyer, just needed a update. Lorenda Hatchet, Criston Chancellor Put form in Dr.Breen's box. Lorenda Hatchet, Renato Battles

## 2013-06-21 ENCOUNTER — Ambulatory Visit (INDEPENDENT_AMBULATORY_CARE_PROVIDER_SITE_OTHER): Payer: No Typology Code available for payment source | Admitting: Family Medicine

## 2013-06-21 ENCOUNTER — Encounter: Payer: Self-pay | Admitting: Family Medicine

## 2013-06-21 VITALS — BP 141/96 | HR 80 | Ht 66.0 in | Wt 164.5 lb

## 2013-06-21 DIAGNOSIS — M75101 Unspecified rotator cuff tear or rupture of right shoulder, not specified as traumatic: Secondary | ICD-10-CM

## 2013-06-21 DIAGNOSIS — S43429A Sprain of unspecified rotator cuff capsule, initial encounter: Secondary | ICD-10-CM

## 2013-06-21 DIAGNOSIS — F329 Major depressive disorder, single episode, unspecified: Secondary | ICD-10-CM

## 2013-06-21 MED ORDER — BUSPIRONE HCL 10 MG PO TABS: 5.0000 mg | ORAL_TABLET | Freq: Two times a day (BID) | ORAL | Status: DC

## 2013-06-21 NOTE — Telephone Encounter (Signed)
Form for [employer] was completed by Dr Mauricio Po today and faxed back to them. Also disability papers were filled out and handed back to patient. A copy of the disability papers were made and scanned into the chart.Busick, Rodena Medin

## 2013-06-21 NOTE — Assessment & Plan Note (Signed)
Continues with severe depression; managed by [facility] mental health now.  Medication changes noted in chart.

## 2013-06-21 NOTE — Progress Notes (Signed)
  Subjective:    Patient ID: Vicki [name], female    DOB: 05/08/1961, 52 y.o.   MRN: [id number]  HPI Vicki [name] comes in for visit to evaluate for disability paperwork requested by her attorney, [employer] in [place], [place].  Forms completed in the course of the visit, copies made for the EMR and the original signed documents returned to the patient.   Vicki [name] also requests forms be completed by her doctor to evaluate her for ability to donate plasma for monetary remuneration.  Among the forms for this purpose is a form to be completed by the treating physician who is managing her diagnosis of depression.  She has a follow up appointment at [facility] on Monday, Nov 17 and I have asked her to bring this form to that appointment for them to complete.  She tells me that her Paxil was recently increased from 10 to 20mg  daily, taken in the morning.   The basis for Vicki [name]'s disability claim is her bilateral rotator cuff tears, with worse limitation of ROM on the R than the L.  She has been unable to work full-time since 1999 due to this pain and disability.  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  She has not been on chronic opioid medications, and she does not take illicit substances nor drink alcohol.  She continues to smoke cigarettes about 1 pack per day, which is worse when her anxiety gets worse.    Review of Systems     Objective:   Physical Exam Flat affect, no apparent distress MSK: Unable to passively raise her left arm to 45 degrees.  Cannot raise her R arm 30 degrees.  Handgrip is 4/5 and symmetric.  Sensation grossly intact in both hands. Gait unremarkable.        Assessment & Plan:

## 2013-06-21 NOTE — Assessment & Plan Note (Signed)
Disability paperwork completed by me during this visit, copies made for chart (EMR) and patient given original forms signed and completed, to submit to her lawyer [employer] in [place], [place].

## 2013-07-07 ENCOUNTER — Emergency Department (HOSPITAL_COMMUNITY): Payer: No Typology Code available for payment source

## 2013-07-07 ENCOUNTER — Emergency Department (HOSPITAL_COMMUNITY)
Admission: EM | Admit: 2013-07-07 | Discharge: 2013-07-08 | Disposition: A | Discharge status: Home / Self Care | Payer: No Typology Code available for payment source | Attending: Emergency Medicine | Admitting: Emergency Medicine

## 2013-07-07 ENCOUNTER — Encounter (HOSPITAL_COMMUNITY): Payer: Self-pay | Admitting: Emergency Medicine

## 2013-07-07 DIAGNOSIS — Z872 Personal history of diseases of the skin and subcutaneous tissue: Secondary | ICD-10-CM | POA: Insufficient documentation

## 2013-07-07 DIAGNOSIS — IMO0001 Reserved for inherently not codable concepts without codable children: Secondary | ICD-10-CM | POA: Insufficient documentation

## 2013-07-07 DIAGNOSIS — S52501A Unspecified fracture of the lower end of right radius, initial encounter for closed fracture: Secondary | ICD-10-CM

## 2013-07-07 DIAGNOSIS — S52599A Other fractures of lower end of unspecified radius, initial encounter for closed fracture: Secondary | ICD-10-CM | POA: Insufficient documentation

## 2013-07-07 DIAGNOSIS — Z8709 Personal history of other diseases of the respiratory system: Secondary | ICD-10-CM | POA: Insufficient documentation

## 2013-07-07 DIAGNOSIS — Y929 Unspecified place or not applicable: Secondary | ICD-10-CM | POA: Insufficient documentation

## 2013-07-07 DIAGNOSIS — G479 Sleep disorder, unspecified: Secondary | ICD-10-CM | POA: Insufficient documentation

## 2013-07-07 DIAGNOSIS — Z87768 Personal history of other specified (corrected) congenital malformations of integument, limbs and musculoskeletal system: Secondary | ICD-10-CM | POA: Insufficient documentation

## 2013-07-07 DIAGNOSIS — F17200 Nicotine dependence, unspecified, uncomplicated: Secondary | ICD-10-CM | POA: Insufficient documentation

## 2013-07-07 DIAGNOSIS — Y9389 Activity, other specified: Secondary | ICD-10-CM | POA: Insufficient documentation

## 2013-07-07 DIAGNOSIS — Z8619 Personal history of other infectious and parasitic diseases: Secondary | ICD-10-CM | POA: Insufficient documentation

## 2013-07-07 DIAGNOSIS — Z8776 Personal history of (corrected) congenital malformations of integument, limbs and musculoskeletal system: Secondary | ICD-10-CM | POA: Insufficient documentation

## 2013-07-07 DIAGNOSIS — X500XXA Overexertion from strenuous movement or load, initial encounter: Secondary | ICD-10-CM | POA: Insufficient documentation

## 2013-07-07 DIAGNOSIS — Z87828 Personal history of other (healed) physical injury and trauma: Secondary | ICD-10-CM | POA: Insufficient documentation

## 2013-07-07 DIAGNOSIS — F341 Dysthymic disorder: Secondary | ICD-10-CM | POA: Insufficient documentation

## 2013-07-07 DIAGNOSIS — Z79899 Other long term (current) drug therapy: Secondary | ICD-10-CM | POA: Insufficient documentation

## 2013-07-07 DIAGNOSIS — R296 Repeated falls: Secondary | ICD-10-CM | POA: Insufficient documentation

## 2013-07-07 LAB — CBC
HCT: 40 % (ref 36.0–46.0)
Hemoglobin: 13.6 g/dL (ref 12.0–15.0)
MCHC: 34 g/dL (ref 30.0–36.0)
Platelets: 337 10*3/uL (ref 150–400)
RDW: 13.1 % (ref 11.5–15.5)
WBC: 12.3 10*3/uL — ABNORMAL HIGH (ref 4.0–10.5)

## 2013-07-07 MED ORDER — FENTANYL CITRATE 0.05 MG/ML IJ SOLN
100.0000 ug | Freq: Once | INTRAMUSCULAR | Status: AC
  Administered 2013-07-07: 100 ug via INTRAVENOUS
  Filled 2013-07-07: qty 2

## 2013-07-07 MED ORDER — FENTANYL CITRATE 0.05 MG/ML IJ SOLN
50.0000 ug | INTRAMUSCULAR | Status: DC | PRN
  Administered 2013-07-07: 50 ug via INTRAVENOUS
  Filled 2013-07-07: qty 2

## 2013-07-07 MED ORDER — OXYCODONE HCL 5 MG PO TABS: 5.0000 mg | ORAL_TABLET | ORAL | Status: DC | PRN

## 2013-07-07 MED ORDER — LIDOCAINE HCL (PF) 1 % IJ SOLN
30.0000 mL | Freq: Once | INTRAMUSCULAR | Status: AC
  Administered 2013-07-07: 30 mL via INTRADERMAL
  Filled 2013-07-07: qty 30

## 2013-07-07 NOTE — ED Notes (Signed)
Pt reports she was falling backwards and caught herself with her wrist. Pt with deformity to R wrist.

## 2013-07-07 NOTE — Consult Note (Signed)
  See consult # [id number] Wendall Stade MD

## 2013-07-07 NOTE — ED Provider Notes (Signed)
CSN: [id number]     Arrival date & time 07/07/13  2011 History  This chart was scribed for non-physician practitioner Dierdre Forth, PA-C working with Juliet Rude. Rubin Payor, MD by Danella Maiers, ED Scribe. This patient was seen in room TR06C/TR06C and the patient's care was started at 8:58 PM.  Chief Complaint  Patient presents with  . Wrist Pain   The history is provided by the patient. No language interpreter was used.   HPI Comments: Vicki [name] is a 52 y.o. female who presents to the Emergency Department complaining of sudden-onset, constant right wrist pain with deformity after falling backwards and catching herself with her right wrist 2 hours ago. She states her wrist twisted out of place when she landed on it. She denies hitting her head or LOC. She reports tingling in her fingers but denies numbness. She denies elbow or shoulder pain.  No neck or back pain.  Pt does not take anticoagulants.    Past Medical History  Diagnosis Date  . DEPRESSIVE DISORDER NOT ELSEWHERE CLASSIFIED   . SHOULDER PAIN, RIGHT   . Rotator cuff tear, right   . Sleep disorder   . Acquired hallux valgus of left foot   . Onychomycosis   . Foot deformity, acquired   . Callous ulcer   . Myalgia and myositis, unspecified   . Rotator cuff syndrome of left shoulder   . Depression with anxiety   . Oral thrush   . Dyspnea   . Shortness of breath   . Urinary hesitancy   . Grief reaction    Past Surgical History  Procedure Laterality Date  . Tubal ligation     Family History  Problem Relation Age of Onset  . Heart disease Mother   . Diabetes Mother   . Cancer Mother   . Heart disease Father    History  Substance Use Topics  . Smoking status: Current Every Day Smoker -- 1.00 packs/day    Types: Cigarettes  . Smokeless tobacco: Never Used     Comment: increased smoking again due to stress  . Alcohol Use: Not on file   OB History   Grav Para Term Preterm Abortions TAB SAB Ect Mult Living                  Review of Systems  Constitutional: Negative for fever, diaphoresis, appetite change, fatigue and unexpected weight change.  HENT: Negative for mouth sores.   Eyes: Negative for visual disturbance.  Respiratory: Negative for cough, chest tightness, shortness of breath and wheezing.   Cardiovascular: Negative for chest pain.  Gastrointestinal: Negative for nausea, vomiting, abdominal pain, diarrhea and constipation.  Endocrine: Negative for polydipsia, polyphagia and polyuria.  Genitourinary: Negative for dysuria, urgency, frequency and hematuria.  Musculoskeletal: Positive for arthralgias (right wrist). Negative for back pain and neck stiffness.  Skin: Negative for rash.  Allergic/Immunologic: Negative for immunocompromised state.  Neurological: Negative for syncope, light-headedness and headaches.  Hematological: Does not bruise/bleed easily.  Psychiatric/Behavioral: Negative for sleep disturbance. The patient is not nervous/anxious.     Allergies  Review of patient's allergies indicates no known allergies.  Home Medications   Current Outpatient Rx  Name  Route  Sig  Dispense  Refill  . albuterol (PROVENTIL HFA;VENTOLIN HFA) 108 (90 BASE) MCG/ACT inhaler   Inhalation   Inhale 2 puffs into the lungs every 4 (four) hours as needed for wheezing.   1 Inhaler   4   . busPIRone (BUSPAR) 10 MG tablet  Oral   Take 0.5 tablets (5 mg total) by mouth 2 (two) times daily.   60 tablet   4   . meloxicam (MOBIC) 7.5 MG tablet   Oral   Take 2 tablets (15 mg total) by mouth daily.   60 tablet   2   . PARoxetine (PAXIL) 10 MG tablet   Oral   Take 1 tablet (10 mg total) by mouth every morning.   30 tablet   6   . oxyCODONE (OXY IR/ROXICODONE) 5 MG immediate release tablet   Oral   Take 1 tablet (5 mg total) by mouth every 4 (four) hours as needed for severe pain.   50 tablet   0    BP 146/93  Pulse 88  Temp(Src) 98.4 F (36.9 C) (Oral)  Resp 16  SpO2  96% Physical Exam  Nursing note and vitals reviewed. Constitutional: She appears well-developed and well-nourished. No distress.  HENT:  Head: Normocephalic and atraumatic.  Eyes: Conjunctivae are normal.  Neck: Normal range of motion.  Cardiovascular: Normal rate, regular rhythm and intact distal pulses.   Capillary refill less than 3 seconds  Pulmonary/Chest: Effort normal and breath sounds normal.  Musculoskeletal: She exhibits tenderness. She exhibits no edema.  ROM of wrist: none Obvious dorsal deformity of the right wrist.  Full rom of the fingers and elbow  Neurological: She is alert. Coordination normal.  Sensation in tact Strength 3/5 due to pain  Skin: Skin is warm and dry. She is not diaphoretic.  No tenting of the skin  Psychiatric: She has a normal mood and affect.    ED Course  Procedures (including critical care time) Medications  fentaNYL (SUBLIMAZE) injection 50 mcg (50 mcg Intravenous Given 07/07/13 2141)  fentaNYL (SUBLIMAZE) injection 100 mcg (100 mcg Intravenous Given 07/07/13 2216)  lidocaine (PF) (XYLOCAINE) 1 % injection 30 mL (30 mLs Intradermal Given 07/07/13 2311)  fentaNYL (SUBLIMAZE) injection 100 mcg (100 mcg Intravenous Given 07/07/13 2306)    DIAGNOSTIC STUDIES: Oxygen Saturation is 96% on RA, normal by my interpretation.    COORDINATION OF CARE: 9:12 PM- Discussed treatment plan with pt which includes reduction. Pt agrees to plan.    Labs Review Labs Reviewed  CBC  BASIC METABOLIC PANEL   Imaging Review Dg Wrist Complete Right  07/07/2013   CLINICAL DATA:  Trauma and pain.  EXAM: RIGHT WRIST - COMPLETE 3+ VIEW  COMPARISON:  None.  FINDINGS: Impacted, mildly comminuted intra-articular distal radius fracture. Dorsal impaction and displacement. Mild underlying osteopenia. Ulna intact. Scapholunate interval upper normal at 3 mm.  IMPRESSION: Impacted intra-articular distal radius fracture.   Electronically Signed   By: Jeronimo Greaves M.D.    On: 07/07/2013 21:52    EKG Interpretation   None       MDM   1. Distal radial fracture, right, closed, initial encounter     Vicki [name] presents with right wrist pain and deformity after falling backwards onto her hand about 7pm.    Patient X-Ray with Impacted intra-articular distal radius fracture angulated about 30 degrees dorsally.  Pain managed in ED.   11:03 PM Discussed with Dr. Amanda Pea who will reduce the fracture here in the department and then splint.    11:34 PM Wrist reduced Br Dr. Amanda Pea.  Post reduction films pending along with pre-op labs and ECG.  Pt is to follow-up with Dr. Amanda Pea on Wednesday.    Dahlia Client Shawnay Bramel, PA-C 07/08/13 0010

## 2013-07-08 ENCOUNTER — Telehealth: Payer: Self-pay | Admitting: Family Medicine

## 2013-07-08 DIAGNOSIS — S62101A Fracture of unspecified carpal bone, right wrist, initial encounter for closed fracture: Secondary | ICD-10-CM | POA: Insufficient documentation

## 2013-07-08 LAB — BASIC METABOLIC PANEL
BUN: 24 mg/dL — ABNORMAL HIGH (ref 6–23)
Calcium: 9 mg/dL (ref 8.4–10.5)
Chloride: 102 mEq/L (ref 96–112)
GFR calc Af Amer: >90 mL/min (ref >90)
GFR calc non Af Amer: >90 mL/min (ref >90)
Glucose, Bld: 124 mg/dL — ABNORMAL HIGH (ref 70–99)
Potassium: 4.1 mEq/L (ref 3.5–5.1)
Sodium: 138 mEq/L (ref 135–145)

## 2013-07-08 NOTE — Consult Note (Signed)
NAMEANNALYSIA, WILLENBRING NO.:  [id number]  MEDICAL RECORD NO.:  [id number]  LOCATION:  TR06C                        FACILITY:  [facility]  PHYSICIAN:  Dionne Ano. Elzabeth Mcquerry, M.D.DATE OF BIRTH:  February 27, 1961  DATE OF CONSULTATION: DATE OF DISCHARGE:  07/08/2013                                CONSULTATION   HISTORY OF PRESENT ILLNESS:  I had the pleasure to see Vicki [name] for evaluation and treatment of her upper extremity predicament. The patient complains of pain in her right upper extremity after a fall on outstretched hand.  She was on a chair getting some Brownie Mix, fell, and sustained an isolated right upper extremity fracture.  She has a comminuted complex greater than __________ articular distal radius fracture with gross displacement.  I was asked to see and treat.  I was called at 11:00 p.m. roughly.  Since that time, I have discussed all issues with the patient.  I have discussed with her about her injury mechanism, complaints etc.  She is a pack-a-day smoker for many many years.  She does not work.  She denies chest pain, shortness of breath. Her history is COPD.  She states she has lungs of a 52 year old.  PAST MEDICAL HISTORY:  Noted in the chart.  PAST SURGICAL HISTORY:  Noted in the chart.  MEDICINES:  Noted in the chart.  ALLERGIES:  No known drug allergies.  SOCIAL HISTORY:  She does not work.  She is a smoker.  She does not drink.  She is currently unemployed.  PHYSICAL EXAMINATION:  Female, alert and oriented, in no acute distress. Vital signs are stable.  Neck and back are nontender.  Abdomen is nontender.  Chest is clear with no shortness of breath.  Left upper extremity is neurovascularly intact.  She has intact IV access.  Lower extremity examination is benign.  She can perform straight leg raise. There is no signs of DVT, infection, dystrophy, or other abnormality.  I have reviewed this with her at length and the findings.  X-rays of  the right upper extremity correlate with her obvious findings which is that of a stable elbow displaced distal radius fracture.  She has difficulty moving her fingers.  She is fairly swollen.  She has intact refill.  Light touch sensation is roughly intact.  I have gone ahead and discussed these issues with her.  At present time, I consented her verbally for hematoma block which she consented for.  PROCEDURE NOTE:  Following this, she was prepped draped in usual sterile fashion with alcohol and Betadine, followed by hematoma block utilizing 10 mL of 1% lidocaine without epinephrine.  Following this, I then placed some finger trap traction.  She had a manipulative reduction performed without difficulty.  There were no complicating features. Once this was complete, the patient then had placement of a sugar-tong splint, and discussion of elevation, range of motion, massage, etc.  We will go and give postreduction films.  I am going to go ahead and ask for an 18-hour chest x-ray given her lung issues.  I am going to go ahead and give preop labs on the assumption.  This will likely need some degree of  fixation given the abnormalities which see.  I have discussed with her do's and don'ts, risks and benefits, and plans for the future.  We once again discussed her at great length of elevation, range of motion, other measures as there are to remain and pertain to her upper extremity predicament.  She understands this and we will proceed.  It was pleasure to see her today.  We did ask the emergency staff to go ahead and write for a 50 of OxyIR.  I am going to recommend Peri-Colace, vitamin C and other measures as well.  These notes have been discussed. We will call the patient and discuss for a followup care plan, have her return to see [place] Wednesday for followup check.     Dionne Ano. Amanda Pea, M.D.     [facility]  D:  07/07/2013  T:  07/08/2013  Job:  [id number]

## 2013-07-08 NOTE — Telephone Encounter (Signed)
Patient seen in [facility] for fractured wrist and now needs a referral to see Orthopedic Surgeon. Patient has [facility]N. Please call once completed.

## 2013-07-08 NOTE — Telephone Encounter (Signed)
Will fwd. To PCP for review .Vicki [name]  

## 2013-07-08 NOTE — Telephone Encounter (Signed)
Pt has appt already. Dr.Breen spoke with the ortho doctor. Vicki [name], Vicki [name]

## 2013-07-08 NOTE — Telephone Encounter (Signed)
Phone conversation with Dr Amanda Pea regarding her right distal radius fracture that was reduced last night in ED.  Concerns for angular collapse in her, which would be an indication for surgery.  She is to see him on Dec 3rd, after which will decide if it would be reasonable to have her followed radiographically through [facility], or if more likely will need surgery with Dr Amanda Pea. Paula Compton, MD

## 2013-07-10 ENCOUNTER — Emergency Department (INDEPENDENT_AMBULATORY_CARE_PROVIDER_SITE_OTHER)
Admission: EM | Admit: 2013-07-10 | Discharge: 2013-07-10 | Disposition: A | Discharge status: Home / Self Care | Payer: No Typology Code available for payment source | Source: Home / Self Care | Attending: Family Medicine | Admitting: Family Medicine

## 2013-07-10 ENCOUNTER — Ambulatory Visit: Payer: No Typology Code available for payment source | Admitting: Family Medicine

## 2013-07-10 ENCOUNTER — Telehealth: Payer: Self-pay | Admitting: Family Medicine

## 2013-07-10 ENCOUNTER — Encounter (HOSPITAL_COMMUNITY): Payer: Self-pay | Admitting: Emergency Medicine

## 2013-07-10 DIAGNOSIS — S62109A Fracture of unspecified carpal bone, unspecified wrist, initial encounter for closed fracture: Secondary | ICD-10-CM

## 2013-07-10 DIAGNOSIS — S62101A Fracture of unspecified carpal bone, right wrist, initial encounter for closed fracture: Secondary | ICD-10-CM

## 2013-07-10 NOTE — ED Provider Notes (Signed)
Medical screening examination/treatment/procedure(s) were performed by non-physician practitioner and as supervising physician I was immediately available for consultation/collaboration.  EKG Interpretation    Date/Time:  Monday July 08 2013 00:19:06 EST Ventricular Rate:  72 PR Interval:    QRS Duration: 66 QT Interval:  386 QTC Calculation: 422 R Axis:   52 Text Interpretation:  Accelerated Junctional rhythm Nonspecific ST abnormality Abnormal ECG ED PHYSICIAN INTERPRETATION AVAILABLE IN [facility] Confirmed by TEST, RECORD ([id number]), editor CLAYTON  CCT  CETT, ROBIN (2) on 07/10/2013 6:48:46 AM             Juliet Rude. Rubin Payor, MD 07/10/13 1521

## 2013-07-10 NOTE — Telephone Encounter (Signed)
Earma Reading called to speak with Dr Mauricio Po about his patient who had wrist fracture with reduction in the ED now possibly needing hand surgery for her fracture, recommending referral to [facility], [facility] or [facility] for proper care, patient is needing coordination of care for referral since she has [facility] card. Dr Logan Bores will send her to our clinic to be seen for referral if patient is stable not needing immediate attention.

## 2013-07-10 NOTE — ED Provider Notes (Signed)
Vicki [name] is a 52 y.o. female who presents to Urgent Care today for followup right wrist fracture. Patient suffered a fall and right wrist fracture on November 30 at home. She was seen by Dr. Amanda Pea at the [facility] emergency room he reduced the fracture and placed her into a well-made splint. She was instructed to followup with him today. She attempted a followup however she is unable to afford the $250 copayment as she does not have health insurance. She notes some hand swelling and is here today for further evaluation and management. She is an established patient at the [facility]. She's been taking 5mg  tablets of OxyIR every 2-6 hours for pain. He denies any loss of sensation or severe radiating pain.   Past Medical History  Diagnosis Date  . DEPRESSIVE DISORDER NOT ELSEWHERE CLASSIFIED   . SHOULDER PAIN, RIGHT   . Rotator cuff tear, right   . Sleep disorder   . Acquired hallux valgus of left foot   . Onychomycosis   . Foot deformity, acquired   . Callous ulcer   . Myalgia and myositis, unspecified   . Rotator cuff syndrome of left shoulder   . Depression with anxiety   . Oral thrush   . Dyspnea   . Shortness of breath   . Urinary hesitancy   . Grief reaction    History  Substance Use Topics  . Smoking status: Current Every Day Smoker -- 1.00 packs/day    Types: Cigarettes  . Smokeless tobacco: Never Used     Comment: increased smoking again due to stress  . Alcohol Use: No   ROS as above Medications reviewed. No current facility-administered medications for this encounter.   Current Outpatient Prescriptions  Medication Sig Dispense Refill  . albuterol (PROVENTIL HFA;VENTOLIN HFA) 108 (90 BASE) MCG/ACT inhaler Inhale 2 puffs into the lungs every 4 (four) hours as needed for wheezing.  1 Inhaler  4  . busPIRone (BUSPAR) 10 MG tablet Take 0.5 tablets (5 mg total) by mouth 2 (two) times daily.  60 tablet  4  . meloxicam (MOBIC) 7.5 MG tablet Take 2  tablets (15 mg total) by mouth daily.  60 tablet  2  . oxyCODONE (OXY IR/ROXICODONE) 5 MG immediate release tablet Take 1 tablet (5 mg total) by mouth every 4 (four) hours as needed for severe pain.  50 tablet  0  . PARoxetine (PAXIL) 10 MG tablet Take 1 tablet (10 mg total) by mouth every morning.  30 tablet  6    Exam:  BP 126/82  Pulse 82  Temp(Src) 97.5 F (36.4 C) (Oral)  Resp 20  SpO2 94% Gen: Well NAD RIGHT ARM: Well-made splint Moderate hand swelling. Normal hand motion sensation and capillary refill.  No induration or erythema present  Assessment and Plan: 53 y.o. female with comminuted interarticular displaced angulated distal radius fracture treated with reduction November 30.  Ideally this patient should be seen by hand surgery here in town. However she does not have health insurance, which is a serious superior to her getting adequate care.  I discussed the case with the on-call provider at [facility]. We will attempt to coordinate care for her. Specifically I recommend contacting [facility], [facility] or [facility] orthopedic residency programs and see if she can be seen there. If not she may be seen at the [facility]. I would recommend circularization of the splint if the fracture appears to be  well positioned.  Discussed warning signs or symptoms. Please see discharge instructions. Patient expresses understanding.  I did not obtain x-rays at this visit because I am attempting to reduce costs for the patient and she will require x-rays at the orthopedic office.      Rodolph Bong, MD 07/10/13 [phone]

## 2013-07-10 NOTE — ED Notes (Signed)
C/o R hand pain. Fell Sunday and landed on R arm.  Has fracture R wrist. Dr. Butler Denmark set it and casted it.  States her hand swelled up Monday morning. She has been keeping it elevated.  R hand and fingers appear swollen.  C/o numbness and tingling yesterday AM, but not now.  Good cap refill.

## 2013-07-11 ENCOUNTER — Telehealth: Payer: Self-pay | Admitting: *Deleted

## 2013-07-11 NOTE — Telephone Encounter (Signed)
Pt came by yesterday and spoke with her regarding her options for ortho.  I made an appt for at [facility] but they require $150 upfront.  She is unsure if she will be able to come up with this by 07-26-13 @10am .   I also called Dr. Carlos Levering office regarding their required amount upfront and was told that he was actually on phone with Dr. Mauricio Po regarding her.  Told pt that someone would call her regarding this.  Will forward to both PCP and Dr. Reola Calkins who pt was scheduled with yesterday.  She was taken off the schedule since all she needed was a referral and no need to see the Dr. Stann Mainland wait to hear from Dr. Mauricio Po before moving forward with this appt.  Jazmin Hartsell,CMA

## 2013-07-12 ENCOUNTER — Encounter: Payer: Self-pay | Admitting: Family Medicine

## 2013-07-12 ENCOUNTER — Ambulatory Visit (INDEPENDENT_AMBULATORY_CARE_PROVIDER_SITE_OTHER): Payer: No Typology Code available for payment source | Admitting: Family Medicine

## 2013-07-12 VITALS — BP 117/74 | HR 105 | Ht 66.0 in | Wt 164.0 lb

## 2013-07-12 DIAGNOSIS — S62101S Fracture of unspecified carpal bone, right wrist, sequela: Secondary | ICD-10-CM

## 2013-07-12 DIAGNOSIS — S42309S Unspecified fracture of shaft of humerus, unspecified arm, sequela: Secondary | ICD-10-CM

## 2013-07-12 NOTE — Progress Notes (Signed)
Patient ID: Vicki [name], female   DOB: May 11, 1961, 51 y.o.   MRN: [id number]  DOI: Nove 30, 2014. Pt was seen in ED for wrist fracture, had it reduced and splint / cast by  ortho. She had follow up set for [facility] Dec 3rd  With hand surgeon Dr Amanda Pea but cancelled appt w ortho office.She  does not have insurance and is concerned about financial impact. Does have 'orange card" which will pay for x rays at [facility] but not at ortho office. Dr. Mauricio Po spoke with Dr Amanda Pea about this and Dr Toma Deiters really wanted to see her for initial follow up (concern for loss of reduction/ angle closure) but evidently Ms Rings too worried about $ to pursue that path. I spoke with Dr Mauricio Po about this and we can try to follow here her at [facility] can at least follow with x ray and cast check. If the reduction is not maintained, I am not sure what we can do--Dr. Amanda Pea sort of left open that he might potentially be willing to see her, but he also definitely wanted the initial f/u visit with him. Since that did not occur, Ms Gracey is in a bit iof a sticky situation. I think the only thing I can do is have my receptionist call her and see if she can come in today for appt so we can discuss. If she is no tavailable for  today apppt, hen maybe we can get her in next week with either me here at [facility] or with Dr. Mauricio Po. At least we can do x ray at [facility].   IMAGING: reports. I have vieqwed films independently as well. Initial pre-reduction film CLINICAL DATA: Trauma and pain.  EXAM:  RIGHT WRIST - COMPLETE 3+ VIEW  COMPARISON: None.  FINDINGS:  Impacted, mildly comminuted intra-articular distal radius fracture.  Dorsal impaction and displacement. Mild underlying osteopenia. Ulna  intact. Scapholunate interval upper normal at 3 mm.  IMPRESSION:  Impacted intra-articular distal radius fracture.  Post reduction film COMPARISON: Earlier in the evening at 2121 hr.  FINDINGS:  2358 hr. Placement of overlying cast which obscures bony  detail.  Improved alignment of distal radius intra-articular comminuted  fracture. No new fracture identified.  IMPRESSION:  Improved alignment of distal radius fracture.

## 2013-07-15 NOTE — Progress Notes (Signed)
   Subjective:    Patient ID: Vicki [name], female    DOB: [birth year], 52 y.o.   MRN: [id number]  HPI  Injury July 07, 2013. See my previous note from earlier today. Patient opted not to follow up with hand surgeon secondary to financial concerns. She's here today to try to establish followup care with [place] at the sports medicine Center.  Patient relates she's had very little pain. She's had some swelling but tries to elevate her hand as often as possible and that usually improves with swelling. She is right-hand dominant. She continues to have normal sensation in her fingers.  Review of Systems Denies fever, denies sensory change in her hand.    Objective:   Physical Exam  Vital signs are reviewed GENERAL: Well-developed female no acute distress EXTREMITY: Right. She has a splint from right below the elbow down to her mid hand. Fingers have some mild swelling and some ecchymoses she had but she has movement of all fingers normal sensory and normal vascular exam. Area above the cast is normal she is plenty of room.      Assessment & Plan:  Right wrist fracture status post reduction in the emergency department. Discussed at length that the optimal treatment would be to follow up with hand surgeon. Secondary to financial concerns she declines that followup. I will get an x-ray of her next week and see her back in one week. She will call the interim with problems and we discussed what red flags to watch out for such as increased pain, hand swelling, hand numbness her skin color change.

## 2013-07-17 ENCOUNTER — Ambulatory Visit (HOSPITAL_COMMUNITY)
Admission: RE | Admit: 2013-07-17 | Discharge: 2013-07-17 | Disposition: A | Discharge status: Home / Self Care | Payer: No Typology Code available for payment source | Source: Ambulatory Visit | Attending: Family Medicine | Admitting: Family Medicine

## 2013-07-17 DIAGNOSIS — S62101S Fracture of unspecified carpal bone, right wrist, sequela: Secondary | ICD-10-CM

## 2013-07-17 DIAGNOSIS — IMO0001 Reserved for inherently not codable concepts without codable children: Secondary | ICD-10-CM | POA: Insufficient documentation

## 2013-07-18 ENCOUNTER — Telehealth: Payer: Self-pay | Admitting: Family Medicine

## 2013-07-18 NOTE — Telephone Encounter (Signed)
I called patient to discuss results of her recent followup xray of R distal radius fracture done on 12/08, which shows displacement of the fracture that had been reduced in the ED.  Films reviewed with Dr Jennette Kettle from [facility].  Given the shifting of the fracture, I informed Vicki [name] that it is my recommendation that she see the orthopedic surgeon in follow up, as this goes beyond the scope of medical management or surveillance.  She is informed that failure to get surgical intervention may result in nonunion of the fracture, limitation of the range of motion and ability to use her wrist.  I offered to call Dr Amanda Pea to arrange a follow up for her, she says that she is unlikely to follow up there due to concerns about finances.  I have spoken with our CSW, Vicki [name], about possible resources for Vicki [name] to access surgical care.  It appears that Vicki [name]'s [employer] is set to expire on July 22, 2013.  Vicki [name] tells me she has attempted to apply for insurance through the PPACA and that the cost of the premiums was beyond her ability to pay.  Vicki [name] is trying to get information about the likely out-of-pocket expenses for surgery at a regional academic medical center practice such as [facility] or [phone]; I have told Ayame that while I do not know what this will cost her, it does not change the medical recommendation that she be seen by Orthopedics somewhere. She states that she plans to follow up with Dr. Jennette Kettle tomorrow as previously scheduled; plan to try an expedited referral to a regional teaching program for Orthopedic follow-up.  Vicki Compton, MD

## 2013-07-19 ENCOUNTER — Encounter: Payer: Self-pay | Admitting: Family Medicine

## 2013-07-19 ENCOUNTER — Ambulatory Visit (INDEPENDENT_AMBULATORY_CARE_PROVIDER_SITE_OTHER): Payer: No Typology Code available for payment source | Admitting: Family Medicine

## 2013-07-19 VITALS — BP 125/82 | Ht 66.0 in | Wt 161.0 lb

## 2013-07-19 DIAGNOSIS — S42309S Unspecified fracture of shaft of humerus, unspecified arm, sequela: Secondary | ICD-10-CM

## 2013-07-19 DIAGNOSIS — S62101S Fracture of unspecified carpal bone, right wrist, sequela: Secondary | ICD-10-CM

## 2013-07-19 NOTE — Patient Instructions (Signed)
Vicki Severin MD 07/19/13 AT 1230PM [address] [phone] [name] IS THE FINANCIAL ADVISOR. HER NUMBER IS [phone]  GO TO [facility] AND GET XRAY PUT ON A DISC FOR YOUR 1230 APPT

## 2013-07-26 NOTE — Progress Notes (Signed)
   Subjective:    Patient ID: Vicki [name], female    DOB: March 13, 1961, 52 y.o.   MRN: [id number]  HPI Followup right wrist fracture. Her x-ray showed some apparent movement of the reduction. She's here today for further followup. I'm concerned that she needs surgical intervention released orthopedic followup. .   Review of Systems Pain is about the same in her wrist. No fever, decreased swelling in her hand, no new ecchymoses.    Objective:   Physical Exam  Vital signs are reviewed GENERAL: Well-developed female no acute distress RISK: Right. She still in the splint. She has mild swelling in her fingers but has full range of movement and normal neurovascular status. IMAGING: Apparent interval shift in the distal radius fragment.      Assessment & Plan:  I think she definitely needs orthopedic followup in the bowel, most likely need surgery. We spent 40 minutes today arranging that she will followup with Dr. Janace Litten at 12:30 today.

## 2013-08-02 ENCOUNTER — Other Ambulatory Visit: Payer: Self-pay | Admitting: Family Medicine

## 2013-08-06 NOTE — Telephone Encounter (Signed)
Will FWD to MD.  Cynda Soule L, CMA  

## 2013-08-06 NOTE — Telephone Encounter (Signed)
Pt called and wanted to know the status of her refill request on Meloxicam jw

## 2013-08-15 ENCOUNTER — Other Ambulatory Visit: Payer: Self-pay | Admitting: Family Medicine

## 2013-08-15 MED ORDER — MELOXICAM 7.5 MG PO TABS: ORAL_TABLET | ORAL | Status: DC

## 2013-08-23 ENCOUNTER — Ambulatory Visit (INDEPENDENT_AMBULATORY_CARE_PROVIDER_SITE_OTHER): Payer: Self-pay | Admitting: Family Medicine

## 2013-08-23 ENCOUNTER — Encounter: Payer: Self-pay | Admitting: Family Medicine

## 2013-08-23 VITALS — BP 144/81 | HR 100 | Temp 98.4°F | Ht 66.0 in | Wt 163.7 lb

## 2013-08-23 DIAGNOSIS — J449 Chronic obstructive pulmonary disease, unspecified: Secondary | ICD-10-CM | POA: Insufficient documentation

## 2013-08-23 DIAGNOSIS — J441 Chronic obstructive pulmonary disease with (acute) exacerbation: Secondary | ICD-10-CM

## 2013-08-23 MED ORDER — PREDNISONE 20 MG PO TABS: 40.0000 mg | ORAL_TABLET | Freq: Every day | ORAL | Status: DC

## 2013-08-23 MED ORDER — ALBUTEROL SULFATE HFA 108 (90 BASE) MCG/ACT IN AERS: 2.0000 | INHALATION_SPRAY | Freq: Four times a day (QID) | RESPIRATORY_TRACT | Status: DC | PRN

## 2013-08-23 MED ORDER — DOXYCYCLINE HYCLATE 100 MG PO TABS: 100.0000 mg | ORAL_TABLET | Freq: Two times a day (BID) | ORAL | Status: DC

## 2013-08-23 NOTE — Assessment & Plan Note (Signed)
Increased cough and sputum production without fever in a smoker. Reviewed recent CXR which was done since the onset of her symptoms.  I find no exam findings to support lung infiltrates and she does not look terribly ill today; not tachypneic and her room-air pulse oximetry is normal. Plan to treat as bronchitis with short burst prednisone and doxycycline; albuterol HFA; instructions about reasons to come back or seek urgent/emergent care.

## 2013-08-23 NOTE — Progress Notes (Signed)
   Subjective:    Patient ID: Vicki [name], female    DOB: May 13, 1961, 53 y.o.   MRN: [id number]  HPI Patient presenting for complaint of cough with increased green sputum production, which began before New Year's.  No fevers or chills, no headaches associated. Her daughter (age 12) had similar respiratory symptoms before the patient got sick.  She continues to feel generally ill. No chest pain or shortness of breath.  She did have small amount of blood-tinged sputum one time 4 days ago.  No significant change in her shortness of breath. No changes in her smoking amounts, smokes about 1 ppd.  She continues to deal with many social stressors.  Her son, age 95, is back living with her and is addicted to crack, has become abusive to patient and her infirm sister. Financial stressors.  Regarding her distal radius fracture, she is seeing Orthopedics for ongoing management of this.    Review of Systems    See above Objective:   Physical ExamGenerally well-appearing, no apparent distress HEENT Neck supple,. TMs clear bilaterally. Clear oropharynx, hyperemic without exudates. No frontal or maxillary sinus tenderness. No cervical adenopathy. COR Regular S1S2 PULM Clear bilaterally, no rales or wheezes.       Assessment & Plan:

## 2013-08-23 NOTE — Patient Instructions (Signed)
It was a pleasure to see you today.  I believe you have a flare of bronchitis.    Prednisone 20mg  tablets, take 2 tablets/day with breakfast for 5 days.  Doxycyline 100mg , take 1 tablet by mouth twice daily for 10 days.  Albuterol inhaler, 2 puffs every 6 hours.  I recommend you quit smoking completely.   Please make a follow up appointment in the coming 2 weeks, or sooner if needed.

## 2013-09-25 ENCOUNTER — Ambulatory Visit: Payer: Self-pay

## 2013-10-01 ENCOUNTER — Ambulatory Visit: Payer: Self-pay

## 2013-10-14 ENCOUNTER — Ambulatory Visit: Payer: No Typology Code available for payment source

## 2013-11-05 ENCOUNTER — Ambulatory Visit (INDEPENDENT_AMBULATORY_CARE_PROVIDER_SITE_OTHER): Payer: No Typology Code available for payment source | Admitting: Family Medicine

## 2013-11-05 ENCOUNTER — Encounter: Payer: Self-pay | Admitting: Family Medicine

## 2013-11-05 VITALS — BP 140/82 | HR 83 | Ht 66.0 in | Wt 163.7 lb

## 2013-11-05 DIAGNOSIS — M7710 Lateral epicondylitis, unspecified elbow: Secondary | ICD-10-CM

## 2013-11-05 DIAGNOSIS — R079 Chest pain, unspecified: Secondary | ICD-10-CM | POA: Insufficient documentation

## 2013-11-05 DIAGNOSIS — M7712 Lateral epicondylitis, left elbow: Secondary | ICD-10-CM

## 2013-11-05 DIAGNOSIS — K220 Achalasia of cardia: Secondary | ICD-10-CM

## 2013-11-05 NOTE — Patient Instructions (Signed)
It was a pleasure to see you again today.  For the sensation of pain and food stuck in your esophagus, I am ordering an x-ray study to look at the contour of your esophagus.  Also, a routine chest x-ray.   For your LEFT ELBOW PAIN ("lateral epicondylitis"), I recommend you to use a TENNIS ELBOW STRAP around the forearm during the day.  Also, fold a bath towel lengthwise and wrap around the left elbow at night, to prevent the elbow from bending completely at night.  Warm compresses to the left elbow throughout the day.   I would like to see you back in the coming 4-6 weeks, or sooner if needed.

## 2013-11-06 NOTE — Assessment & Plan Note (Signed)
Clinical diagnosis by exam today.  Tennis elbow strap; warm compresses; limitation of flexion at night while asleep.  Did not recommend NSAIDs given patient's other complaint of central chest pain suspected of being GI-related. Follow up in 4 to 6 weeks.

## 2013-11-06 NOTE — Progress Notes (Signed)
   Subjective:    Patient ID: Vicki [name], female    DOB: 05-Dec-1960, 53 y.o.   MRN: [id number]  HPI Vicki [name] for follow up.  She presents with chief complaint of left elbow pain along lateral aspect.  No episode of trauma that she recalls. Hasn't tried anything to ameliorate the pain.  She is applying for disability and has a court date in December 2015.   Also complains of 1-1/2 months of painful sensation that "food gets stuck in esophagus"; happens nearly every time she eats.  No nausea, no emesis. No fevers/chills/sweats.  She denies sensation of GERD.  Continues to smoke greater than 1ppd.  Does not drink alcohol.   Continues with some cough, although this is better than previous.  Occasional clear sputum.  No blood in sputum. No hoarseness.   Social Hx; Her infirm sister moved out, to an apartment on [address], because she could not stand the patient's son being at home.  Vicki [name] has told me in the past that her son is addicted to crack cocaine and is "54 years old but acts like a kid". Vicki [name] continues to smoke >1 ppd, per today's report.    Review of Systems     Objective:   Physical Exam Alert, speaking clearly and in fluid sentences, no apparent distress HEENT: Neck supple, no cervical adenopathy.  COR Regular S1S2 PULM Clear bilaterally, no rales or wheezes ABD Soft, generally nontender but with some mild epigastric tenderness to deep palpation. No masses or organomegaly.  MSK: Point tenderness over the L lateral epicondyle.  Full active ROM L elbow.  No redness or edema at site of tenderness over L elbow.        Assessment & Plan:

## 2013-11-06 NOTE — Assessment & Plan Note (Signed)
Complaint of feeling that food is stuck in esophagus with eating, nearly every time she eats, for the past 1-1/2 months.  Continues to smoke >1ppd.  Discussed gold standard as referral to GI for EGD evaluation, possibly manometry, versus barium swallow which is covered by her orange card.  She opts for radiology services that are covered by [facility] card, despite the risk of false-negatives. JB

## 2013-11-07 ENCOUNTER — Ambulatory Visit
Admission: RE | Admit: 2013-11-07 | Discharge: 2013-11-07 | Disposition: A | Discharge status: Home / Self Care | Payer: No Typology Code available for payment source | Source: Ambulatory Visit | Attending: Family Medicine | Admitting: Family Medicine

## 2013-11-07 DIAGNOSIS — K220 Achalasia of cardia: Secondary | ICD-10-CM

## 2013-11-07 DIAGNOSIS — R079 Chest pain, unspecified: Secondary | ICD-10-CM

## 2013-11-18 ENCOUNTER — Telehealth: Payer: Self-pay | Admitting: Family Medicine

## 2013-11-18 MED ORDER — OMEPRAZOLE 40 MG PO CPDR: 40.0000 mg | DELAYED_RELEASE_CAPSULE | Freq: Every day | ORAL | Status: DC

## 2013-11-18 NOTE — Telephone Encounter (Signed)
Called patient to report results of CXR and barium swallow study, which shows hiatal hernia.  Trial of PPI and follow-up visit recommended.  She notes that she has been having some pain in her legs, which she would like to address in next follow up visit. JB

## 2013-12-17 ENCOUNTER — Encounter: Payer: Self-pay | Admitting: Family Medicine

## 2013-12-17 ENCOUNTER — Ambulatory Visit (INDEPENDENT_AMBULATORY_CARE_PROVIDER_SITE_OTHER): Payer: No Typology Code available for payment source | Admitting: Family Medicine

## 2013-12-17 VITALS — BP 138/87 | HR 84 | Ht 66.0 in | Wt 168.0 lb

## 2013-12-17 DIAGNOSIS — M25569 Pain in unspecified knee: Secondary | ICD-10-CM

## 2013-12-17 DIAGNOSIS — F418 Other specified anxiety disorders: Secondary | ICD-10-CM

## 2013-12-17 DIAGNOSIS — M25561 Pain in right knee: Secondary | ICD-10-CM | POA: Insufficient documentation

## 2013-12-17 DIAGNOSIS — M545 Low back pain, unspecified: Secondary | ICD-10-CM

## 2013-12-17 DIAGNOSIS — F341 Dysthymic disorder: Secondary | ICD-10-CM

## 2013-12-17 NOTE — Patient Instructions (Signed)
It was a pleasure to see you today.  I am sorry you are continuing to have back pain, and right knee pain.   As we discussed, I am ordering xrays of your low back and your knees, to look for arthritic changes.   I am referring you back to the Pain Management specialist for another evaluation.  In the meantime, you may continue to use Tylenol and the meloxicam 15mg  daily that you have been taking.   I am referring you for Physical Therapy to consider other ways (non-medication based) to help your knee and back pains.  Heat compresses can be helpful, and I recommend keeping your range-of-motion in your back with gentle stretching exercises periodically during the day.   Follow up with me in 6 to 8 weeks.

## 2013-12-18 NOTE — Assessment & Plan Note (Signed)
Patient with exacerbation of low back pain which she has had for a number of years.  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 Uses Mobic daily, with minimal relief. Is less interested in opioids for pain management, but would like to see the Pain Specialists to discuss other possible interventions.  We focused on non-pharm interventions such as stretching, topical heat application, and physical therapy.

## 2013-12-18 NOTE — Progress Notes (Signed)
   Subjective:    Patient ID: Vicki [name], female    DOB: Nov 03, 1960, 53 y.o.   MRN: [id number]  HPI Patient comes in today for complaints of pain, particularly in low back pain that is similar in character to her former back pain.  No radiation to legs, no weakness or falls. Worse when getting up from sitting to standing. No bowel or bladder incontinence.  Tylenol not helping. Takes meloxicam 15mg /day and finds it mildly helpful.  Saw Pain Management in the past, was last seen there in Fall of 2013 and she reports that "they wanted to give me narcotics that I didn't want to take" . Had been on Tramadol and states she did not find it helpful.  Uses heat pack sometimes, mild relief.   Also with pain in R knee, which she says swells up sometimes.  Had standing xrays of knees in 12/2010, which did not show changes associated with OA.  No redness or warmth of the knees.   For her depression she follows with [facility], sees psychiatrist there.  She says she gets some sleeping medicines from her [place] but doesn't recall the name; gives her vivid dreams.  Is not able to give me names of other medications prescribed by [facility].  Still with some right wrist pain with lifting, none at rest. She had a fall and fracture on 11/30, had followed with Dr Amedeo Plenty and had cast removed and was discharged from Ortho care after about 6 weeks.   Social Hx: Continues to smoke 1ppd cigarettes. Son is in jail for "stealing at [place]" ; her sister is now living in an apartment and Delonna is considering selling everything and moving in with her sister.  Sister's health is poor, [place] concerned that she will move in and sister will die.   ;Review of Systems     Objective:   Physical Exam Well appearing, no apparent distress HEENT Neck supple, no cervical adenopathy.  MSK: Wrists without tenderness, redness or warmth to palpation.  Palpable radial pulses and grossly intact sensation in digits of both hands.    BACK: Negative sitting straight leg raise bilaterally. No point tenderness over vertebral processes.  No skin changes or vesicular lesions on back. Able to stand and ambulate without assistance.  KNEES: Full active ROM without effusion noted in both knees. No calor/tumor/rubor; some tenderness along the joint line in R knee. No instability with drawer testing bilaterally.        Assessment & Plan:

## 2013-12-18 NOTE — Assessment & Plan Note (Signed)
Worsening right knee pain; suggestive of DJD.  Xrays standing of both knees. Continue Mobic. May use tylenol as needed. Physical therapy, strengthening.

## 2013-12-18 NOTE — Assessment & Plan Note (Signed)
Patient continues under the care of [facility] for this; she is asked to bring in her medications that are prescribed by [facility] to her next appointment. To continue under their care for this.

## 2013-12-21 ENCOUNTER — Ambulatory Visit (HOSPITAL_COMMUNITY)
Admission: EM | Admit: 2013-12-21 | Discharge: 2013-12-21 | Disposition: A | Discharge status: Home / Self Care | Payer: No Typology Code available for payment source | Source: Ambulatory Visit | Attending: Family Medicine | Admitting: Family Medicine

## 2013-12-21 ENCOUNTER — Ambulatory Visit (HOSPITAL_COMMUNITY)
Admission: RE | Admit: 2013-12-21 | Discharge: 2013-12-21 | Disposition: A | Discharge status: Home / Self Care | Payer: No Typology Code available for payment source | Source: Ambulatory Visit | Attending: Family Medicine | Admitting: Family Medicine

## 2013-12-21 DIAGNOSIS — M25569 Pain in unspecified knee: Secondary | ICD-10-CM | POA: Insufficient documentation

## 2013-12-21 DIAGNOSIS — M259 Joint disorder, unspecified: Secondary | ICD-10-CM | POA: Insufficient documentation

## 2013-12-21 DIAGNOSIS — M25559 Pain in unspecified hip: Secondary | ICD-10-CM | POA: Insufficient documentation

## 2013-12-21 DIAGNOSIS — M545 Low back pain, unspecified: Secondary | ICD-10-CM

## 2013-12-21 DIAGNOSIS — I700 Atherosclerosis of aorta: Secondary | ICD-10-CM | POA: Insufficient documentation

## 2013-12-21 DIAGNOSIS — M25561 Pain in right knee: Secondary | ICD-10-CM

## 2013-12-24 ENCOUNTER — Telehealth: Payer: Self-pay | Admitting: *Deleted

## 2013-12-24 NOTE — Telephone Encounter (Signed)
Relayed message,patient voiced understanding.[place]

## 2013-12-24 NOTE — Telephone Encounter (Signed)
Message copied by Corinna Capra on Tue Dec 24, 2013 11:07 AM ------      Message from: Willeen Niece      Created: Mon Dec 23, 2013  3:07 PM       Please call Ms. Headen and let her know that her back x-ray was negative, but that her knee x-rays do show some degenerative arthritis that explains her knee pain.  We should continue with the referral to Pain Management as we discussed in her most recent visit.             Thanks,       JB ------

## 2014-01-08 ENCOUNTER — Ambulatory Visit: Payer: No Typology Code available for payment source | Attending: Family Medicine | Admitting: Physical Therapy

## 2014-01-08 DIAGNOSIS — IMO0001 Reserved for inherently not codable concepts without codable children: Secondary | ICD-10-CM | POA: Insufficient documentation

## 2014-01-08 DIAGNOSIS — M545 Low back pain, unspecified: Secondary | ICD-10-CM | POA: Insufficient documentation

## 2014-01-08 DIAGNOSIS — M25659 Stiffness of unspecified hip, not elsewhere classified: Secondary | ICD-10-CM | POA: Insufficient documentation

## 2014-01-14 ENCOUNTER — Ambulatory Visit: Payer: No Typology Code available for payment source | Admitting: Physical Therapy

## 2014-01-20 ENCOUNTER — Encounter: Payer: No Typology Code available for payment source | Admitting: Physical Therapy

## 2014-01-22 ENCOUNTER — Ambulatory Visit: Payer: No Typology Code available for payment source | Admitting: Physical Therapy

## 2014-01-28 ENCOUNTER — Ambulatory Visit: Payer: No Typology Code available for payment source | Admitting: Physical Therapy

## 2014-01-30 ENCOUNTER — Ambulatory Visit: Payer: No Typology Code available for payment source | Admitting: Physical Therapy

## 2014-02-04 ENCOUNTER — Ambulatory Visit: Payer: No Typology Code available for payment source | Admitting: Physical Therapy

## 2014-02-06 ENCOUNTER — Ambulatory Visit: Payer: No Typology Code available for payment source | Attending: Family Medicine | Admitting: Physical Therapy

## 2014-02-06 DIAGNOSIS — M25659 Stiffness of unspecified hip, not elsewhere classified: Secondary | ICD-10-CM | POA: Insufficient documentation

## 2014-02-06 DIAGNOSIS — IMO0001 Reserved for inherently not codable concepts without codable children: Secondary | ICD-10-CM | POA: Insufficient documentation

## 2014-02-06 DIAGNOSIS — M545 Low back pain, unspecified: Secondary | ICD-10-CM | POA: Insufficient documentation

## 2014-02-12 ENCOUNTER — Ambulatory Visit: Payer: No Typology Code available for payment source | Admitting: Family Medicine

## 2014-02-12 ENCOUNTER — Encounter: Payer: No Typology Code available for payment source | Admitting: Physical Therapy

## 2014-02-13 ENCOUNTER — Encounter: Payer: No Typology Code available for payment source | Admitting: Physical Therapy

## 2014-02-17 ENCOUNTER — Ambulatory Visit: Payer: No Typology Code available for payment source | Admitting: Physical Therapy

## 2014-02-19 ENCOUNTER — Encounter: Payer: No Typology Code available for payment source | Admitting: Physical Therapy

## 2014-02-24 ENCOUNTER — Ambulatory Visit: Payer: No Typology Code available for payment source | Admitting: Physical Therapy

## 2014-03-21 ENCOUNTER — Ambulatory Visit (INDEPENDENT_AMBULATORY_CARE_PROVIDER_SITE_OTHER): Payer: No Typology Code available for payment source | Admitting: Family Medicine

## 2014-03-21 ENCOUNTER — Encounter: Payer: Self-pay | Admitting: Family Medicine

## 2014-03-21 VITALS — BP 112/76 | HR 98 | Temp 98.1°F | Ht 66.0 in | Wt 165.8 lb

## 2014-03-21 DIAGNOSIS — M25551 Pain in right hip: Secondary | ICD-10-CM

## 2014-03-21 DIAGNOSIS — R0600 Dyspnea, unspecified: Secondary | ICD-10-CM

## 2014-03-21 DIAGNOSIS — R0989 Other specified symptoms and signs involving the circulatory and respiratory systems: Secondary | ICD-10-CM

## 2014-03-21 DIAGNOSIS — F329 Major depressive disorder, single episode, unspecified: Secondary | ICD-10-CM

## 2014-03-21 DIAGNOSIS — R609 Edema, unspecified: Secondary | ICD-10-CM

## 2014-03-21 DIAGNOSIS — R600 Localized edema: Secondary | ICD-10-CM

## 2014-03-21 DIAGNOSIS — R0609 Other forms of dyspnea: Secondary | ICD-10-CM

## 2014-03-21 DIAGNOSIS — F3289 Other specified depressive episodes: Secondary | ICD-10-CM

## 2014-03-21 DIAGNOSIS — M25559 Pain in unspecified hip: Secondary | ICD-10-CM

## 2014-03-21 LAB — CBC
HEMATOCRIT: 41.9 % (ref 36.0–46.0)
Hemoglobin: 14.4 g/dL (ref 12.0–15.0)
MCH: 29.1 pg (ref 26.0–34.0)
MCHC: 34.4 g/dL (ref 30.0–36.0)
MCV: 84.6 fL (ref 78.0–100.0)
Platelets: 366 10*3/uL (ref 150–400)
RBC: 4.95 MIL/uL (ref 3.87–5.11)
RDW: 13.7 % (ref 11.5–15.5)
WBC: 8.7 10*3/uL (ref 4.0–10.5)

## 2014-03-21 MED ORDER — MELOXICAM 7.5 MG PO TABS: ORAL_TABLET | ORAL | Status: DC

## 2014-03-21 NOTE — Assessment & Plan Note (Signed)
Managed by psychiatrist at [facility] at this time.

## 2014-03-21 NOTE — Patient Instructions (Signed)
It was a pleasure to see you today.   For the pain in your right hip, I am ordering an x-ray.  Treatment will depend on the results of the x-ray.  I have your phone as [phone].  For the swelling, I am ordering labs today.   I would like to get a CT scan of your chest for the longstanding chest pressure and shortness of breath, along with the fact that you are a smoker.   I would like to see you back in 4 to 6 weeks, or sooner as needed.

## 2014-03-21 NOTE — Assessment & Plan Note (Signed)
Patient with significant smoking history and chronic dyspnea with cough.  She has had unremarkable spirometry and CXR.  Given persistence of symptoms in setting of smoking hx, for CT chest with contrast. Metabolic panel today will evaluate renal function. Discussed smoking cessation.

## 2014-03-21 NOTE — Assessment & Plan Note (Signed)
Suspected R trochanteric bursitis. Point tenderness over greater trochanter. For x-ray hip prior to consideration of inj in office.

## 2014-03-21 NOTE — Assessment & Plan Note (Signed)
Suspect venous insufficiency as cause.  Will check CMet to evaluate serum protein and renal function as well. Compression stockings and elevation.

## 2014-03-21 NOTE — Progress Notes (Signed)
   Subjective:    Patient ID: Vicki [name], female    DOB: Aug 28, 1960, 53 y.o.   MRN: [id number]  HPI Jalah is here for follow up on several issues:   1. Complains of bilateral ankle swelling. Not painful, but is bothersome. Worsen throughout the day.  Wears tennis shoes.   2. C/o right hip pain that is worse when she walks. Is present but mild while sitting still.  No trauma.  No falls.  Has not responded much to meloxicam, which she takes for other pain.   3. Continues to smoke. Feels very anxious since moving in with her sister and sister's niece, young-adult children.   4. Continues with cough and shortness of breath. She continues to smoke 1ppd. Has had spirometry in this office (June 2014) which was relatively unremarkable; CXR earlier this year that was likely unremarkable.  Nonproductive cough; no bloody sputum. No fevers or chills, nor weight gain.   5. Patient follows with [facility] psychiatrist for depressive d/o and anxiety, maintained on buspar, Paxil, and another med for sleep (she does not recall the name).  No SI.  Is keeping appointments with them. She does not drink alcohol.   Social Hx; as above. Was turned down for disability, has reapplied and has a hearing in April 2016 for this. 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   Review of Systems Continued dyspnea with cough; no fevers/chills; no chest pain.  No N/V, no blood per rectum.      Objective:   Physical Exam Well appearing, no apparent distress HEENT Neck supple; no cervical adenopathy. Moist mucus membranes.  COR Regular S1S2, no extra sounds PULM Diffuse rhonchi on exam; no focal rales or wheezes.  MSK: Point tenderness over R greater trochanter. Full active and passive ROM bilateral hips. Hip flexion symmetric and full strength. SLR intact bilat.  EXTS: Palpable dorsalis pedis  pulses bilaterally; sensation grossly intact bilat. Full ankle dorsi/plantarflexion, no clonus.No calf tenderness or disparate calf girth. 1-2+ bilateral ankle edema bilaterally.        Assessment & Plan:

## 2014-03-22 LAB — COMPREHENSIVE METABOLIC PANEL
ALK PHOS: 63 U/L (ref 39–117)
ALT: 16 U/L (ref 0–35)
AST: 15 U/L (ref 0–37)
Albumin: 3.6 g/dL (ref 3.5–5.2)
BILIRUBIN TOTAL: 0.4 mg/dL (ref 0.2–1.2)
BUN: 11 mg/dL (ref 6–23)
CO2: 30 meq/L (ref 19–32)
CREATININE: 0.6 mg/dL (ref 0.50–1.10)
Calcium: 8.5 mg/dL (ref 8.4–10.5)
Chloride: 100 mEq/L (ref 96–112)
Glucose, Bld: 98 mg/dL (ref 70–99)
Potassium: 4.3 mEq/L (ref 3.5–5.3)
SODIUM: 135 meq/L (ref 135–145)
Total Protein: 5.7 g/dL — ABNORMAL LOW (ref 6.0–8.3)

## 2014-03-24 ENCOUNTER — Encounter: Payer: Self-pay | Admitting: Family Medicine

## 2014-03-25 ENCOUNTER — Ambulatory Visit (HOSPITAL_COMMUNITY): Payer: No Typology Code available for payment source

## 2014-04-07 ENCOUNTER — Ambulatory Visit
Admission: RE | Admit: 2014-04-07 | Discharge: 2014-04-07 | Disposition: A | Discharge status: Home / Self Care | Payer: No Typology Code available for payment source | Source: Ambulatory Visit | Attending: Family Medicine | Admitting: Family Medicine

## 2014-04-07 DIAGNOSIS — R0600 Dyspnea, unspecified: Secondary | ICD-10-CM

## 2014-04-07 DIAGNOSIS — M25551 Pain in right hip: Secondary | ICD-10-CM

## 2014-04-07 MED ORDER — IOHEXOL 300 MG/ML  SOLN
75.0000 mL | Freq: Once | INTRAMUSCULAR | Status: AC | PRN
  Administered 2014-04-07: 75 mL via INTRAVENOUS

## 2014-04-11 ENCOUNTER — Telehealth: Payer: Self-pay | Admitting: Family Medicine

## 2014-04-11 DIAGNOSIS — N6320 Unspecified lump in the left breast, unspecified quadrant: Secondary | ICD-10-CM | POA: Insufficient documentation

## 2014-04-11 NOTE — Telephone Encounter (Signed)
[phone] (H)  Called pt to report findings of R hip xray (degenerative changes) and the CT chest, done for indication of worsening dyspnea in smoker. Findings of L breast lesion in upper-outer quadrant of L breast. Recommendation for diagnostic L breast mammography and L breast US.  Vicki [name] voices agreement with this plan.   May consider R trochanteric bursa injection.  JB

## 2014-04-15 ENCOUNTER — Other Ambulatory Visit: Payer: Self-pay | Admitting: Family Medicine

## 2014-04-15 DIAGNOSIS — Z1231 Encounter for screening mammogram for malignant neoplasm of breast: Secondary | ICD-10-CM

## 2014-04-23 ENCOUNTER — Ambulatory Visit: Payer: Self-pay

## 2014-05-09 ENCOUNTER — Telehealth: Payer: Self-pay | Admitting: *Deleted

## 2014-05-09 NOTE — Telephone Encounter (Signed)
Vicki [name] had a CT chest done in August that identified a nodule in her breast; I spoke with her about this by phone, as well as the plan to have a DIAGNOSTIC mammogram to evaluate further.  Please clarify with patient and [facility] that patient has an order for DIAGNOSTIC study in light of the abnormality on CT of the chest.  Thank you,  JB

## 2014-05-09 NOTE — Telephone Encounter (Signed)
[facility] called stating that a diagnostic mammogram was ordered, but pt has called [facility] and scheduled a screening mammogram.  Please give the a call to clarify if diagnostic mammogram is needed at [phone].  Vicki Barrow, RN

## 2014-05-12 NOTE — Telephone Encounter (Signed)
Appointment rescheduled at the [facility] for diagnostic mammogram for 10/8 at 1245, left message on voicemail informing patient.

## 2014-05-15 ENCOUNTER — Other Ambulatory Visit: Payer: Self-pay

## 2014-05-29 ENCOUNTER — Ambulatory Visit (HOSPITAL_COMMUNITY): Payer: Self-pay

## 2014-08-12 ENCOUNTER — Encounter: Payer: Self-pay | Admitting: Family Medicine

## 2014-08-12 ENCOUNTER — Ambulatory Visit
Admission: RE | Admit: 2014-08-12 | Discharge: 2014-08-12 | Disposition: A | Discharge status: Home / Self Care | Payer: No Typology Code available for payment source | Source: Ambulatory Visit | Attending: Family Medicine | Admitting: Family Medicine

## 2014-08-12 ENCOUNTER — Ambulatory Visit (INDEPENDENT_AMBULATORY_CARE_PROVIDER_SITE_OTHER): Payer: Self-pay | Admitting: Family Medicine

## 2014-08-12 DIAGNOSIS — M21962 Unspecified acquired deformity of left lower leg: Secondary | ICD-10-CM

## 2014-08-12 DIAGNOSIS — R600 Localized edema: Secondary | ICD-10-CM

## 2014-08-12 MED ORDER — BUSPIRONE HCL 10 MG PO TABS: 5.0000 mg | ORAL_TABLET | Freq: Two times a day (BID) | ORAL | Status: DC

## 2014-08-12 MED ORDER — PAROXETINE HCL 10 MG PO TABS: 10.0000 mg | ORAL_TABLET | ORAL | Status: DC

## 2014-08-12 MED ORDER — MELOXICAM 7.5 MG PO TABS: ORAL_TABLET | ORAL | Status: DC

## 2014-08-12 NOTE — Patient Instructions (Signed)
It was a pleasure to see you today.   Lab work to evaluate the leg swelling.   Xray of the left foot/ankle.    Heating pad along the back of the right side of your head when you have the headaches.   Follow up in the coming 3-5 weeks.

## 2014-08-13 ENCOUNTER — Telehealth: Payer: Self-pay | Admitting: Family Medicine

## 2014-08-13 LAB — CBC
HCT: 42.7 % (ref 36.0–46.0)
HEMOGLOBIN: 14.3 g/dL (ref 12.0–15.0)
MCH: 28.8 pg (ref 26.0–34.0)
MCHC: 33.5 g/dL (ref 30.0–36.0)
MCV: 86.1 fL (ref 78.0–100.0)
MPV: 9.7 fL (ref 8.6–12.4)
Platelets: 366 10*3/uL (ref 150–400)
RBC: 4.96 MIL/uL (ref 3.87–5.11)
RDW: 13.4 % (ref 11.5–15.5)
WBC: 7.4 10*3/uL (ref 4.0–10.5)

## 2014-08-13 LAB — COMPREHENSIVE METABOLIC PANEL
ALBUMIN: 3.9 g/dL (ref 3.5–5.2)
ALT: 24 U/L (ref 0–35)
AST: 21 U/L (ref 0–37)
Alkaline Phosphatase: 62 U/L (ref 39–117)
BILIRUBIN TOTAL: 0.4 mg/dL (ref 0.2–1.2)
BUN: 11 mg/dL (ref 6–23)
CALCIUM: 9.1 mg/dL (ref 8.4–10.5)
CO2: 29 mEq/L (ref 19–32)
CREATININE: 0.56 mg/dL (ref 0.50–1.10)
Chloride: 106 mEq/L (ref 96–112)
Glucose, Bld: 97 mg/dL (ref 70–99)
Potassium: 4.5 mEq/L (ref 3.5–5.3)
Sodium: 141 mEq/L (ref 135–145)
Total Protein: 6.2 g/dL (ref 6.0–8.3)

## 2014-08-13 NOTE — Progress Notes (Signed)
   Subjective:    Patient ID: Vicki [name], female    DOB: [birth year], 54 y.o.   MRN: [id number]  HPI Patient here with longtime friend Vicki [name] [phone]). [name] gives me verbal permission to release any medical information to [facility], as [name]'s cell phone is low on minutes and sometimes is out of service.   Patient complains of L sided foot pain and swelling, has been getting worse since her last visit here in August.  Pain in L heel with weight bearing. Some swelling in R ankle as well. Using sandals to accommodate the swelling in her feet. No trauma recently; may have had distant history of fracture in L ankle (can't recall which ankle was affected). Has been able to bear weight albeit with pain.   Continues with bilateral shoulder pain, better now than when she used to work.   Her cough and shortness of breath is unchanged from previous.  She continues to smoke 1ppd cigarettes.   Seeing psychiatrist at [facility] for medications, including buspar and another med that she cannot recall.  Says she's doing well and following up there every 4-6 weeks.    Review of Systems     Objective:   Physical Exam Well appearing, no apparent distress HEENT neck supple, no cervical adenopathy COR regular S1S2 PULM Clear bilaterally, no rales or wheezes MSK: Able to lift both shoulders above horizontal plane.  L ankle with marked pitting edema (2+), more laterally than medially (lateral malleolus).  No ecchymosis, no discoloration/hyperpigmentation or erythema/warmth. Palpable dp pulse in both fee feet. No exquisite tenderness, no calf tenderness. No point tenderness over achilles tendon or plantar fascia insertion at calcaneus.  There is some edema (1+) in the R ankle as well, again without skin discoloration or ecchymosis. Patient is able to bear weight independently.       Assessment & Plan:

## 2014-08-13 NOTE — Assessment & Plan Note (Signed)
Worse in L than R; has had ECHO in recent months without evidence of heart failure.  To check renal function and transaminases to evaluate for possible renal dysfunction or hepatic dysfunction as causes. Also to consider venous stasis. No evidence of PAD/ischemia, or soft tissue infection/ cellulitis. X-ray of L foot to rule out occult fracture as a cause of the swelling (history of ankle fx, laterality not known).

## 2014-08-13 NOTE — Telephone Encounter (Signed)
Called patient's number to report normal labs and no findings on the x-ray of her foot.  I left her a message, then called her friend Vicki [name] [phone]) and left a message for Vicki [name] to call.  I wish to tell her that her labs are normal and x-ray does not show explanation for her swelling. I recommend elevating her legs and may use an ACE wrap around the ankle to reduce swelling.  If continues, for appointment with PharmD clinic for ABI to exclude PAD, after which to consider prescription graded compression hose.  JB

## 2014-10-21 ENCOUNTER — Ambulatory Visit (HOSPITAL_COMMUNITY)
Admission: RE | Admit: 2014-10-21 | Discharge: 2014-10-21 | Disposition: A | Discharge status: Home / Self Care | Payer: No Typology Code available for payment source | Source: Ambulatory Visit | Attending: Family Medicine | Admitting: Family Medicine

## 2014-10-21 DIAGNOSIS — Z1231 Encounter for screening mammogram for malignant neoplasm of breast: Secondary | ICD-10-CM

## 2014-10-28 ENCOUNTER — Ambulatory Visit (INDEPENDENT_AMBULATORY_CARE_PROVIDER_SITE_OTHER): Payer: Self-pay | Admitting: Family Medicine

## 2014-10-28 ENCOUNTER — Encounter: Payer: Self-pay | Admitting: Family Medicine

## 2014-10-28 VITALS — BP 128/79 | HR 112 | Temp 98.4°F | Ht 66.0 in | Wt 174.9 lb

## 2014-10-28 DIAGNOSIS — J069 Acute upper respiratory infection, unspecified: Secondary | ICD-10-CM

## 2014-10-28 DIAGNOSIS — J441 Chronic obstructive pulmonary disease with (acute) exacerbation: Secondary | ICD-10-CM

## 2014-10-28 DIAGNOSIS — R7301 Impaired fasting glucose: Secondary | ICD-10-CM

## 2014-10-28 DIAGNOSIS — R358 Other polyuria: Secondary | ICD-10-CM

## 2014-10-28 DIAGNOSIS — R3589 Other polyuria: Secondary | ICD-10-CM

## 2014-10-28 DIAGNOSIS — R739 Hyperglycemia, unspecified: Secondary | ICD-10-CM

## 2014-10-28 LAB — POCT URINALYSIS DIPSTICK
Bilirubin, UA: NEGATIVE
Glucose, UA: NEGATIVE
KETONES UA: NEGATIVE
Leukocytes, UA: NEGATIVE
Nitrite, UA: NEGATIVE
Protein, UA: NEGATIVE
RBC UA: NEGATIVE
SPEC GRAV UA: 1.015
Urobilinogen, UA: 0.2
pH, UA: 8

## 2014-10-28 LAB — CBC
HCT: 42.9 % (ref 36.0–46.0)
Hemoglobin: 14.5 g/dL (ref 12.0–15.0)
MCH: 28.9 pg (ref 26.0–34.0)
MCHC: 33.8 g/dL (ref 30.0–36.0)
MCV: 85.6 fL (ref 78.0–100.0)
MPV: 9.9 fL (ref 8.6–12.4)
Platelets: 308 10*3/uL (ref 150–400)
RBC: 5.01 MIL/uL (ref 3.87–5.11)
RDW: 13.4 % (ref 11.5–15.5)
WBC: 11.7 10*3/uL — AB (ref 4.0–10.5)

## 2014-10-28 LAB — POCT GLYCOSYLATED HEMOGLOBIN (HGB A1C): Hemoglobin A1C: 6.2

## 2014-10-28 MED ORDER — ALBUTEROL SULFATE (2.5 MG/3ML) 0.083% IN NEBU
2.5000 mg | INHALATION_SOLUTION | Freq: Once | RESPIRATORY_TRACT | Status: AC
  Administered 2014-10-28: 2.5 mg via RESPIRATORY_TRACT

## 2014-10-28 MED ORDER — ALBUTEROL SULFATE HFA 108 (90 BASE) MCG/ACT IN AERS: 2.0000 | INHALATION_SPRAY | Freq: Four times a day (QID) | RESPIRATORY_TRACT | Status: DC | PRN

## 2014-10-28 MED ORDER — PAROXETINE HCL 10 MG PO TABS: 10.0000 mg | ORAL_TABLET | ORAL | Status: DC

## 2014-10-28 MED ORDER — IPRATROPIUM BROMIDE 0.02 % IN SOLN
0.5000 mg | Freq: Once | RESPIRATORY_TRACT | Status: AC
  Administered 2014-10-28: 0.5 mg via RESPIRATORY_TRACT

## 2014-10-28 NOTE — Progress Notes (Signed)
Subjective:     Patient ID: Vicki [name], female   DOB: 1961-01-03, 54 y.o.   MRN: [id number]  HPI: Vicki [name] presents today for follow-up of various clinical complaints. Last night around 11pm she began to feel congested, noting rhinorrhea, productive cough, 'scratchy throat', frequent chills, and red conjunctiva. She took ~2tsp of Robitussin this morning. She reports no sick contacts.   Vicki [name] also endorses 'feeling tired all the time.' She sleeps 10 hours per night (~12am-10am) on average, and naps for 2 hours most afternoons. She wakes up 3 times during the night to urinate. She has been off her paroxetine for the past week and has noticed worsening depressive symptoms including frequent crying and getting angry easily.   Regarding the aforementioned polyuria, she checked her blood sugar with her sister's glucometer and noted it to be 202 mg/dL.   She reports that her right foot is still swollen, which is exacerbated by long periods of standing. Associated pain is minimal. Wearing sandals alleviates the pain and swelling. She has not tried ACE bandages or anything else to reduce the swelling.   She has pain in her rotator cuffs bilaterally, and she is unable to lift heavy items. The pain radiates up to her neck and down her arm on exertion. She takes Meloxicam for the pain but reports limited symptom relief.   Social History: Vicki [name] recently moved in with her sister and 4 grandchildren in [place]. She says it is going well, but she finds living with children tiring. She plans to move into an apartment with her sister in the next two weeks.    Review of Systems: General: weight gain GI: diarrhea 4 days ago, since resolved    Objective:   Physical Exam: HEENT: red conjunctiva, submandibular LAD Lungs: CTA bilaterally, Cardiac: RRR, no MRG Abdomen: soft and non-tender in 4 quadrants  MSK: right foot and calf swollen, markedly over the lateral malleolus. DP pulse intact.  Left foot normal. Bilateral varicose veins.     Assessment:    1. Congestion, productive cough: most likely a viral URI due to abrupt onset, short duration, and negative physical findings including crackles in lungs. Pneumonia is on the differential, but unlikely due to absence of pleuritic chest pain, dyspnea and presence of clear breath sounds. Although allergies elicit similar symptoms and the patient reports a history of allergies, the abrupt onset makes this less likely.  2. Fatigue: most likely due to having stopped the paroxetine in the past week-- patient is experiencing increased depressive symptoms with a reactive mood. Hypothyroidism is also on the differential, supported by the patient's reported weight gain.  3. Polyuria: diabetes is on the differential due to the patient's reported increased glucometer reading, family history, and personal history of an A1C of 6% in the past. UTI is next on the differential, but less likely due to absence of burning on urination, suprapubic pain, or urgency. If both are ruled out, primary polydipsia is most likely because the patient reports drinking a cup of coffee and/or bottle of water before going to sleep. 4. Right foot: presumed venous stasis due to exacerbation with prolonged standing, minimal pain, and presence of varicose veins bilaterally. Trauma has been previously ruled out by CXR and no additional trauma is reported. Heart failure is also on the differential, but less likely due to lack of personal history of heart disease, absence of other suggestive symptoms such as dyspnea on exertion, absence of physical exam findings such as normal cardiac  auscultation and lack of pulmonary congestion, and unilateral nature of the swelling. Peripheral Artery Disease was previously on the differential, but is less likely to the absence of history of vascular disease, exacerbation of swelling on standing, only mild reported pain, and normal capillary refill and  present DP pulses. DVT is a must not miss diagnosis, but is improbable in this case due to the chronic nature of the swelling, absence of pain, absence of pitting edema, and normal DP pulses.      Plan:    1. Congestion, productive cough: advised to continue taking 2 tsp of Robitussin 2-3 times daily as needed. Will order CBC to check WBC count. Patient reported relief from symptoms after Duoneb was given in the office. 2. Fatigue: Refilled paroxetine and advised to follow up with [facility] at her appointment on 3/29. Order a TSH if symptoms fail to resolve.  3. Polyuria:  - Urinalysis to test for a UTI  - Urine dipstick and A1C to follow-up on single elevated blood glucose reading

## 2014-10-28 NOTE — Patient Instructions (Signed)
It was a pleasure to see you today.    I believe your cough and congestion is due to a viral respiratory illness.  The cough syrup you have, you may take 2 tsp by mouth twice daily as needed.   We gave a nebulizer treatment today in the office.   For the depression, I encourage you to see the doctors at [facility] who are managing your medicine.  I am giving you a prescription for the Paxil to take one time daily until your scheduled appointment with them.   I am getting labs (urine, blood) to test for diabetes and to see if there are other explanations for the urination at night.   Follow up in the coming 1 month.

## 2014-10-29 ENCOUNTER — Encounter: Payer: Self-pay | Admitting: Family Medicine

## 2014-10-29 DIAGNOSIS — J069 Acute upper respiratory infection, unspecified: Secondary | ICD-10-CM | POA: Insufficient documentation

## 2014-10-29 DIAGNOSIS — R7303 Prediabetes: Secondary | ICD-10-CM | POA: Insufficient documentation

## 2014-10-29 LAB — COMPREHENSIVE METABOLIC PANEL
ALT: 22 U/L (ref 0–35)
AST: 19 U/L (ref 0–37)
Albumin: 3.9 g/dL (ref 3.5–5.2)
Alkaline Phosphatase: 64 U/L (ref 39–117)
BILIRUBIN TOTAL: 0.4 mg/dL (ref 0.2–1.2)
BUN: 11 mg/dL (ref 6–23)
CO2: 29 mEq/L (ref 19–32)
CREATININE: 0.62 mg/dL (ref 0.50–1.10)
Calcium: 8.5 mg/dL (ref 8.4–10.5)
Chloride: 100 mEq/L (ref 96–112)
Glucose, Bld: 110 mg/dL — ABNORMAL HIGH (ref 70–99)
POTASSIUM: 4.1 meq/L (ref 3.5–5.3)
SODIUM: 135 meq/L (ref 135–145)
Total Protein: 5.9 g/dL — ABNORMAL LOW (ref 6.0–8.3)

## 2014-10-29 NOTE — Assessment & Plan Note (Signed)
Polyuria, with home reading CBG that was above 200 (not corroborated in the office).  She has no glucosuria, and A1C is elevated but not in diagnostic range for diabetes mellitus.  Suspect her polyuria more likely related to drinking coffee and water at bedtime.  Plan to counsel to decrease this.

## 2014-10-29 NOTE — Progress Notes (Signed)
   Subjective:    Patient ID: Vicki [name], female    DOB: 01-31-1961, 54 y.o.   MRN: [id number]  HPI  Visit conducted with [facility] medical student Leonarda Salon.   Deniya comes in today with complaint of nasal congestion and cough with clear non-bloody sputum that started yesterday.  She has felt chills, no measurable fever. Has felt more sleepy than usual recently.  She has run out of her Paxil 10mg  daily, prescribed by her psychiatrist at [facility].  Has not had the medicine in 1-1/2 weeks. Feels more forgetful and more sad. Denies SI or HI.  Says she is more tired, but also is more sleepy during the day. Wakes up constantly at night to void.  Drinks cup of coffee and a glass of water before bed.  Does not have polyuria during the day.  Does not have dysuria at any time. Good emptying.   Says she used her sister's glucometer to check her own sugar, was 202 at home.  Astryd does not have a diagnosis of diabetes.   Goes to sleep around 12 midnight and wakes up 10am.  Naps for 2 hours in the afternoon.   Continues to smoke; since she became ill yesterday, only smoked 13 cigarettes that day.  Does not offer a baseline quantity of cigarette/day.   Social Hx. Rachelanne is looking into moving to [place], [place] (cites cheaper rents as reason).  Son is in [place]; he has active drug addiction and has been in prison before.   Review of Systems See above. Also, no abdominal pain.  Had a bout of diarrhea about a week ago, none since. No other changes to bowel habits. No nausea/vomiting. Has had some swelling in legs, worse in L than R.  Did not use compression stockings as directed after last visit. Worse swelling when she's on her feet.     Objective:   Physical Exam Generally well; no acute distress HEENT Neck supple, some shotty anterior cervical and submandibular adenopathy noted. TMs clear bilaterally. Injected conjunctivae. Clear oropharynx. Watery rhinorrhea.  No frontal or maxillary sinus tenderness  . COR Regular S1S2 PULM Clear bilaterally, no rales or wheezes.  ABD Soft, nontender, nondistended. No suprapubic tenderness noted.  EXTS: trace bilateral edema with warm feet and good dp pulses bilaterally. Grossly intact sensation in both feet with brisk cap refill bilat.       Assessment & Plan:

## 2014-10-29 NOTE — Assessment & Plan Note (Signed)
Cared for at [facility], she has an upcoming appointment there. New Rx for Paxil to get her to that appointment.  She denies SI or HI at this time.  Suspect her being out of her SSRI may be contributing to her increased sleep, decreased mood. Urged to keep that appointment with [facility].

## 2014-12-01 ENCOUNTER — Ambulatory Visit (INDEPENDENT_AMBULATORY_CARE_PROVIDER_SITE_OTHER): Payer: Self-pay | Admitting: Family Medicine

## 2014-12-01 ENCOUNTER — Encounter: Payer: Self-pay | Admitting: Family Medicine

## 2014-12-01 VITALS — BP 134/84 | HR 98 | Temp 98.1°F | Ht 66.0 in | Wt 168.0 lb

## 2014-12-01 DIAGNOSIS — M542 Cervicalgia: Secondary | ICD-10-CM

## 2014-12-01 DIAGNOSIS — F418 Other specified anxiety disorders: Secondary | ICD-10-CM

## 2014-12-01 MED ORDER — BUSPIRONE HCL 10 MG PO TABS: 5.0000 mg | ORAL_TABLET | Freq: Two times a day (BID) | ORAL | Status: DC

## 2014-12-01 MED ORDER — CYCLOBENZAPRINE HCL 10 MG PO TABS: 10.0000 mg | ORAL_TABLET | Freq: Three times a day (TID) | ORAL | Status: DC | PRN
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
## 2014-12-01 NOTE — Patient Instructions (Signed)
  Diet Recommendations for Diabetes   Starchy (carb) foods include: Bread, rice, pasta, potatoes, corn, crackers, bagels, muffins, all baked goods.  (Fruits, milk, and yogurt also have carbohydrate, but most of these foods will not spike your blood sugar as the starchy foods will.)  A few fruits do cause high blood sugars; use small portions of bananas (limit to 1/2 at a time), grapes, and tropical fruits.    Protein foods include: Meat, fish, poultry, eggs, dairy foods, and beans such as pinto and kidney beans (beans also provide carbohydrate).   1. Eat at least 3 meals and 1-2 snacks per day. Never go more than 4-5 hours while awake without eating.  2. Limit starchy foods to TWO per meal and ONE per snack. ONE portion of a starchy  food is equal to the following:   - ONE slice of bread (or its equivalent, such as half of a hamburger bun).   - 1/2 cup of a "scoopable" starchy food such as potatoes or rice.   - 15 grams of carbohydrate as shown on food label.  3. Both lunch and dinner should include a protein food, a carb food, and vegetables.   - Obtain twice as many veg's as protein or carbohydrate foods for both lunch and dinner.   - Fresh or frozen veg's are best.   - Try to keep frozen veg's on hand for a quick vegetable serving.    4. Breakfast should always include protein.

## 2014-12-02 DIAGNOSIS — M542 Cervicalgia: Secondary | ICD-10-CM | POA: Insufficient documentation

## 2014-12-02 NOTE — Assessment & Plan Note (Signed)
Refill sent for buspirone. Follows with [facility].

## 2014-12-02 NOTE — Assessment & Plan Note (Signed)
Suspect stress/muscle spasm of trapezius. Relaxation techniques, can use OTC pain medications and will give trial of flexeril. No red flags for spinal cord involvement. F/u as needed.

## 2015-02-11 ENCOUNTER — Encounter: Payer: Self-pay | Admitting: Family Medicine

## 2015-02-11 ENCOUNTER — Ambulatory Visit (INDEPENDENT_AMBULATORY_CARE_PROVIDER_SITE_OTHER): Payer: Self-pay | Admitting: Family Medicine

## 2015-02-11 VITALS — BP 154/82 | HR 89 | Temp 98.4°F | Ht 66.0 in | Wt 176.4 lb

## 2015-02-11 DIAGNOSIS — R600 Localized edema: Secondary | ICD-10-CM

## 2015-02-11 DIAGNOSIS — R05 Cough: Secondary | ICD-10-CM

## 2015-02-11 DIAGNOSIS — R400 Somnolence: Secondary | ICD-10-CM

## 2015-02-11 DIAGNOSIS — R058 Other specified cough: Secondary | ICD-10-CM

## 2015-02-11 DIAGNOSIS — R5383 Other fatigue: Secondary | ICD-10-CM | POA: Insufficient documentation

## 2015-02-11 DIAGNOSIS — G4710 Hypersomnia, unspecified: Secondary | ICD-10-CM

## 2015-02-11 LAB — CBC
HEMATOCRIT: 40.5 % (ref 36.0–46.0)
Hemoglobin: 13.5 g/dL (ref 12.0–15.0)
MCH: 28 pg (ref 26.0–34.0)
MCHC: 33.3 g/dL (ref 30.0–36.0)
MCV: 84 fL (ref 78.0–100.0)
MPV: 9.6 fL (ref 8.6–12.4)
Platelets: 359 10*3/uL (ref 150–400)
RBC: 4.82 MIL/uL (ref 3.87–5.11)
RDW: 13.7 % (ref 11.5–15.5)
WBC: 7.1 10*3/uL (ref 4.0–10.5)

## 2015-02-11 NOTE — Progress Notes (Signed)
   Subjective:    Patient ID: Vicki [name], female    DOB: 10-Nov-1960, 54 y.o.   MRN: [id number]  HPI  CC: leg swelling  # Leg swelling:  Present for at least the past year  Worse after standing on feet most of the day  Elevates legs at night and it goes away completely  No redness or pain ROS: no CP, no SOB  # Dry cough  Worse in past several months  No fevers, no sputum production  Not really SOB  No CP  Hx of asthma ROS: +heartburn, nasal congestion  # Fatigue  Going on for a while  Feels tired all the time, does not awake rested and feels she could still be in bed most of the day  Falls asleep in afternoon  Hx of anemia  Does give plasma 2 times a week  Review of Systems   See HPI for ROS. All other systems reviewed and are negative.  Past medical history, surgical, family, and social history reviewed and updated in the EMR as appropriate. Objective:  BP 154/82 mmHg  Pulse 89  Temp(Src) 98.4 F (36.9 C) (Oral)  Ht 5\' 6"  (1.676 m)  Wt 176 lb 7 oz (80.032 kg)  BMI 28.49 kg/m2  SpO2 97% Vitals and nursing note reviewed  General: NAD CV: RRR, normal s1s2, no murmurs Resp: CTAB, normal effort Ext: 2+ pitting edema bilaterally up to mid calf  Assessment & Plan:  See Problem List Documentation

## 2015-02-11 NOTE — Assessment & Plan Note (Signed)
Multiple common causes likely contributing: GERD, asthma, allergies. Pt unable to afford PPI. No signs of infection at this time. Recommended getting generic zyrtec. F/u as needed.

## 2015-02-11 NOTE — Patient Instructions (Signed)
For allergies:  Zyrtec 5mg , take once daily. Call around to all of the pharmacies and see who has this cheapest, you should be able to get 2-3 months supply (100 tablets) for under $10.  Testing today for anemia and thyroid issues.  The sleep center should call you about the test.   You need to call and talk to Eusebio Friendly by the end of the month to renew your [facility] card.

## 2015-02-11 NOTE — Assessment & Plan Note (Signed)
History and exam consistent with venous insufficiency. Improves with elevation. Had previous workup with kidney and LFTs which were normal. Recommended compression stockings >61mm Hg to be worn during the day. F/u if not improving.

## 2015-02-11 NOTE — Assessment & Plan Note (Signed)
Suspect OSA contributing. Will r/o anemia and thyroid issues. Sleep study ordered, but it will take pt a while to save up money for CPAP if this is diagnosed. F/u after sleep study.

## 2015-02-12 LAB — TSH: TSH: 2.173 u[IU]/mL (ref 0.350–4.500)

## 2015-02-14 ENCOUNTER — Encounter: Payer: Self-pay | Admitting: Family Medicine

## 2015-03-10 ENCOUNTER — Ambulatory Visit: Payer: No Typology Code available for payment source

## 2015-03-17 ENCOUNTER — Ambulatory Visit: Payer: No Typology Code available for payment source

## 2015-04-08 ENCOUNTER — Ambulatory Visit (HOSPITAL_BASED_OUTPATIENT_CLINIC_OR_DEPARTMENT_OTHER): Payer: No Typology Code available for payment source | Attending: Family Medicine

## 2015-06-11 ENCOUNTER — Ambulatory Visit: Payer: No Typology Code available for payment source

## 2015-07-26 ENCOUNTER — Ambulatory Visit (HOSPITAL_BASED_OUTPATIENT_CLINIC_OR_DEPARTMENT_OTHER): Payer: No Typology Code available for payment source

## 2015-09-24 ENCOUNTER — Emergency Department (HOSPITAL_COMMUNITY)
Admission: EM | Admit: 2015-09-24 | Discharge: 2015-09-24 | Disposition: A | Discharge status: Home / Self Care | Payer: No Typology Code available for payment source | Attending: Emergency Medicine | Admitting: Emergency Medicine

## 2015-09-24 ENCOUNTER — Encounter (HOSPITAL_COMMUNITY): Payer: Self-pay | Admitting: Vascular Surgery

## 2015-09-24 DIAGNOSIS — M545 Low back pain, unspecified: Secondary | ICD-10-CM

## 2015-09-24 DIAGNOSIS — R1030 Lower abdominal pain, unspecified: Secondary | ICD-10-CM

## 2015-09-24 DIAGNOSIS — R1032 Left lower quadrant pain: Secondary | ICD-10-CM | POA: Insufficient documentation

## 2015-09-24 DIAGNOSIS — E739 Lactose intolerance, unspecified: Secondary | ICD-10-CM

## 2015-09-24 DIAGNOSIS — F419 Anxiety disorder, unspecified: Secondary | ICD-10-CM | POA: Insufficient documentation

## 2015-09-24 DIAGNOSIS — F17210 Nicotine dependence, cigarettes, uncomplicated: Secondary | ICD-10-CM | POA: Insufficient documentation

## 2015-09-24 DIAGNOSIS — R1031 Right lower quadrant pain: Secondary | ICD-10-CM | POA: Insufficient documentation

## 2015-09-24 DIAGNOSIS — Z79899 Other long term (current) drug therapy: Secondary | ICD-10-CM | POA: Insufficient documentation

## 2015-09-24 DIAGNOSIS — G8929 Other chronic pain: Secondary | ICD-10-CM | POA: Insufficient documentation

## 2015-09-24 DIAGNOSIS — Z872 Personal history of diseases of the skin and subcutaneous tissue: Secondary | ICD-10-CM | POA: Insufficient documentation

## 2015-09-24 DIAGNOSIS — R110 Nausea: Secondary | ICD-10-CM

## 2015-09-24 DIAGNOSIS — Z8619 Personal history of other infectious and parasitic diseases: Secondary | ICD-10-CM | POA: Insufficient documentation

## 2015-09-24 DIAGNOSIS — Z791 Long term (current) use of non-steroidal anti-inflammatories (NSAID): Secondary | ICD-10-CM | POA: Insufficient documentation

## 2015-09-24 DIAGNOSIS — R197 Diarrhea, unspecified: Secondary | ICD-10-CM

## 2015-09-24 DIAGNOSIS — R739 Hyperglycemia, unspecified: Secondary | ICD-10-CM

## 2015-09-24 DIAGNOSIS — M62830 Muscle spasm of back: Secondary | ICD-10-CM

## 2015-09-24 DIAGNOSIS — F329 Major depressive disorder, single episode, unspecified: Secondary | ICD-10-CM | POA: Insufficient documentation

## 2015-09-24 LAB — CBC
HEMATOCRIT: 44 % (ref 36.0–46.0)
Hemoglobin: 14.6 g/dL (ref 12.0–15.0)
MCH: 29.4 pg (ref 26.0–34.0)
MCHC: 33.2 g/dL (ref 30.0–36.0)
MCV: 88.5 fL (ref 78.0–100.0)
Platelets: 350 10*3/uL (ref 150–400)
RBC: 4.97 MIL/uL (ref 3.87–5.11)
RDW: 13.2 % (ref 11.5–15.5)
WBC: 8.4 10*3/uL (ref 4.0–10.5)

## 2015-09-24 LAB — COMPREHENSIVE METABOLIC PANEL
ALBUMIN: 3.5 g/dL (ref 3.5–5.0)
ALT: 22 U/L (ref 14–54)
ANION GAP: 12 (ref 5–15)
AST: 27 U/L (ref 15–41)
Alkaline Phosphatase: 74 U/L (ref 38–126)
BILIRUBIN TOTAL: 0.5 mg/dL (ref 0.3–1.2)
BUN: 9 mg/dL (ref 6–20)
CHLORIDE: 101 mmol/L (ref 101–111)
CO2: 25 mmol/L (ref 22–32)
Calcium: 9.1 mg/dL (ref 8.9–10.3)
Creatinine, Ser: 0.75 mg/dL (ref 0.44–1.00)
GFR calc Af Amer: >60 mL/min (ref >60)
GFR calc non Af Amer: >60 mL/min (ref >60)
GLUCOSE: 166 mg/dL — AB (ref 65–99)
POTASSIUM: 4.1 mmol/L (ref 3.5–5.1)
SODIUM: 138 mmol/L (ref 135–145)
Total Protein: 6.1 g/dL — ABNORMAL LOW (ref 6.5–8.1)

## 2015-09-24 LAB — URINALYSIS, ROUTINE W REFLEX MICROSCOPIC
BILIRUBIN URINE: NEGATIVE
GLUCOSE, UA: NEGATIVE mg/dL
Hgb urine dipstick: NEGATIVE
Ketones, ur: NEGATIVE mg/dL
Leukocytes, UA: NEGATIVE
NITRITE: NEGATIVE
PH: 5 (ref 5.0–8.0)
Protein, ur: NEGATIVE mg/dL
Specific Gravity, Urine: 1.023 (ref 1.005–1.030)

## 2015-09-24 LAB — LIPASE, BLOOD: LIPASE: 31 U/L (ref 11–51)

## 2015-09-24 MED ORDER — HYDROCODONE-ACETAMINOPHEN 5-325 MG PO TABS
1.0000 | ORAL_TABLET | Freq: Once | ORAL | Status: AC
  Administered 2015-09-24: 1 via ORAL
  Filled 2015-09-24: qty 1

## 2015-09-24 MED ORDER — PROMETHAZINE HCL 25 MG PO TABS: 25.0000 mg | ORAL_TABLET | Freq: Four times a day (QID) | ORAL | Status: DC | PRN

## 2015-09-24 MED ORDER — METHOCARBAMOL 500 MG PO TABS
500.0000 mg | ORAL_TABLET | Freq: Once | ORAL | Status: AC
  Administered 2015-09-24: 500 mg via ORAL
  Filled 2015-09-24: qty 1

## 2015-09-24 MED ORDER — MELOXICAM 15 MG PO TABS
15.0000 mg | ORAL_TABLET | Freq: Every day | ORAL | Status: DC
  Administered 2015-09-24: 15 mg via ORAL
  Filled 2015-09-24: qty 1

## 2015-09-24 MED ORDER — MELOXICAM 15 MG PO TABS: 15.0000 mg | ORAL_TABLET | Freq: Every day | ORAL | Status: DC

## 2015-09-24 MED ORDER — METHOCARBAMOL 500 MG PO TABS: 500.0000 mg | ORAL_TABLET | Freq: Three times a day (TID) | ORAL | Status: DC | PRN

## 2015-09-24 MED ORDER — HYDROCODONE-ACETAMINOPHEN 5-325 MG PO TABS: 1.0000 | ORAL_TABLET | Freq: Four times a day (QID) | ORAL | Status: DC | PRN

## 2015-09-24 NOTE — Discharge Instructions (Signed)
For your Abdominal pain/diarrhea: your abdominal pain could have been from eating lactose products (dairy foods). Avoid dairy products, use over-the-counter lactaid if you have any dairy foods. Avoid taking the laxative you took because this will cause diarrhea. Use phenergan as prescribed, as needed for nausea. Stay well hydrated with small sips of fluids throughout the day. Follow a BRAT (banana-rice-applesauce-toast) diet as described below for the next 24-48 hours. The 'BRAT' diet is suggested, then progress to diet as tolerated as symptoms abate. Call your regular doctor if bloody stools, persistent diarrhea, vomiting, fever or abdominal pain. Follow up with your regular doctor in 1 week for recheck of symptoms. Return to ER for changing or worsening of symptoms.  Also, your labs showed your blood sugar was a little high, you will need to follow up with your regular doctor for ongoing evaluation and management of this.  For your Back Pain: Your back pain should be treated with medicines such as ibuprofen or aleve and this back pain should get better over the next 2 weeks.  However if you develop severe or worsening pain, low back pain with fever, numbness, weakness or inability to walk or urinate, you should return to the ER immediately.  Please follow up with your doctor this week for a recheck if still having symptoms.  Avoid heavy lifting over 10 pounds over the next two weeks.  Low back pain is discomfort in the lower back that may be due to injuries to muscles and ligaments around the spine.  Occasionally, it may be caused by a a problem to a part of the spine called a disc.  The pain may last several days or a week;  However, most patients get completely well in 4 weeks.  Self - care:  The application of heat can help soothe the pain.  Maintaining your daily activities, including walking, is encourged, as it will help you get better faster than just staying in bed. Perform gentle stretching as  discussed. Drink plenty of fluids.  Medications are also useful to help with pain control.  A commonly prescribed medication includes norco.  Do not drive or operate heavy machinery while taking this medication.  Non steroidal anti inflammatory medications including Ibuprofen and naproxen and mobic;  These medications help both pain and swelling and are very useful in treating back pain.  They should be taken with food, as they can cause stomach upset, and more seriously, stomach bleeding.    Muscle relaxants (robaxin):  These medications can help with muscle tightness that is a cause of lower back pain.  Most of these medications can cause drowsiness, and it is not safe to drive or use dangerous machinery while taking them. DO NOT COMBINE THIS WITH YOUR FLEXERIL, TAKE ROBAXIN INSTEAD OF THE FLEXERIL YOU  HAVE AT HOME.  SEEK IMMEDIATE MEDICAL ATTENTION IF: New numbness, tingling, weakness, or problem with the use of your arms or legs.  Severe back pain not relieved with medications.  Difficulty with or loss of control of your bowel or bladder control.  Increasing pain in any areas of the body (such as chest or abdominal pain).  Shortness of breath, dizziness or fainting.  Nausea (feeling sick to your stomach), vomiting, fever, or sweats.  You will need to follow up with  Your primary healthcare provider in 1-2 weeks for reassessment.   Food Choices to Help Relieve Diarrhea When you have diarrhea, the foods you eat and your eating habits are very important. Choosing the right  foods and drinks can help relieve diarrhea. Also, because diarrhea can last up to 7 days, you need to replace lost fluids and electrolytes (such as sodium, potassium, and chloride) in order to help prevent dehydration.  WHAT GENERAL GUIDELINES DO I NEED TO FOLLOW?  Slowly drink 1 cup (8 oz) of fluid for each episode of diarrhea. If you are getting enough fluid, your urine will be clear or pale yellow.  Eat starchy foods.  Some good choices include white rice, white toast, pasta, low-fiber cereal, baked potatoes (without the skin), saltine crackers, and bagels.  Avoid large servings of any cooked vegetables.  Limit fruit to two servings per day. A serving is  cup or 1 small piece.  Choose foods with less than 2 g of fiber per serving.  Limit fats to less than 8 tsp (38 g) per day.  Avoid fried foods.  Eat foods that have probiotics in them. Probiotics can be found in certain dairy products.  Avoid foods and beverages that may increase the speed at which food moves through the stomach and intestines (gastrointestinal tract). Things to avoid include:  High-fiber foods, such as dried fruit, raw fruits and vegetables, nuts, seeds, and whole grain foods.  Spicy foods and high-fat foods.  Foods and beverages sweetened with high-fructose corn syrup, honey, or sugar alcohols such as xylitol, sorbitol, and mannitol. WHAT FOODS ARE RECOMMENDED? Grains White rice. White, [place], or pita breads (fresh or toasted), including plain rolls, buns, or bagels. White pasta. Saltine, soda, or graham crackers. Pretzels. Low-fiber cereal. Cooked cereals made with water (such as cornmeal, farina, or cream cereals). Plain muffins. Matzo. Melba toast. Zwieback.  Vegetables Potatoes (without the skin). Strained tomato and vegetable juices. Most well-cooked and canned vegetables without seeds. Tender lettuce. Fruits Cooked or canned applesauce, apricots, cherries, fruit cocktail, grapefruit, peaches, pears, or plums. Fresh bananas, apples without skin, cherries, grapes, cantaloupe, grapefruit, peaches, oranges, or plums.  Meat and Other Protein Products Baked or boiled chicken. Eggs. Tofu. Fish. Seafood. Smooth peanut butter. Ground or well-cooked tender beef, ham, veal, lamb, pork, or poultry.  Dairy Plain yogurt, kefir, and unsweetened liquid yogurt. Lactose-free milk, buttermilk, or soy milk. Plain hard  cheese. Beverages Sport drinks. Clear broths. Diluted fruit juices (except prune). Regular, caffeine-free sodas such as ginger ale. Water. Decaffeinated teas. Oral rehydration solutions. Sugar-free beverages not sweetened with sugar alcohols. Other Bouillon, broth, or soups made from recommended foods.  The items listed above may not be a complete list of recommended foods or beverages. Contact your dietitian for more options. WHAT FOODS ARE NOT RECOMMENDED? Grains Whole grain, whole wheat, bran, or rye breads, rolls, pastas, crackers, and cereals. Wild or brown rice. Cereals that contain more than 2 g of fiber per serving. Corn tortillas or taco shells. Cooked or dry oatmeal. Granola. Popcorn. Vegetables Raw vegetables. Cabbage, broccoli, Brussels sprouts, artichokes, baked beans, beet greens, corn, kale, legumes, peas, sweet potatoes, and yams. Potato skins. Cooked spinach and cabbage. Fruits Dried fruit, including raisins and dates. Raw fruits. Stewed or dried prunes. Fresh apples with skin, apricots, mangoes, pears, raspberries, and strawberries.  Meat and Other Protein Products Chunky peanut butter. Nuts and seeds. Beans and lentils. Berniece Salines.  Dairy High-fat cheeses. Milk, chocolate milk, and beverages made with milk, such as milk shakes. Cream. Ice cream. Sweets and Desserts Sweet rolls, doughnuts, and sweet breads. Pancakes and waffles. Fats and Oils Butter. Cream sauces. Margarine. Salad oils. Plain salad dressings. Olives. Avocados.  Beverages Caffeinated beverages (such as coffee,  tea, soda, or energy drinks). Alcoholic beverages. Fruit juices with pulp. Prune juice. Soft drinks sweetened with high-fructose corn syrup or sugar alcohols. Other Coconut. Hot sauce. Chili powder. Mayonnaise. Gravy. Cream-based or milk-based soups.  The items listed above may not be a complete list of foods and beverages to avoid. Contact your dietitian for more information. WHAT SHOULD I DO IF I BECOME  DEHYDRATED? Diarrhea can sometimes lead to dehydration. Signs of dehydration include dark urine and dry mouth and skin. If you think you are dehydrated, you should rehydrate with an oral rehydration solution. These solutions can be purchased at pharmacies, retail stores, or online.  Drink -1 cup (120-240 mL) of oral rehydration solution each time you have an episode of diarrhea. If drinking this amount makes your diarrhea worse, try drinking smaller amounts more often. For example, drink 1-3 tsp (5-15 mL) every 5-10 minutes.  A general rule for staying hydrated is to drink 1-2 L of fluid per day. Talk to your health care provider about the specific amount you should be drinking each day. Drink enough fluids to keep your urine clear or pale yellow. Document Released: 10/15/2003 Document Revised: 07/30/2013 Document Reviewed: 06/17/2013 [employer] Patient Information 2015 [place], [place]. This information is not intended to replace advice given to you by your health care provider. Make sure you discuss any questions you have with your health care provider.   Back Pain, Adult Back pain is very common in adults.The cause of back pain is rarely dangerous and the pain often gets better over time.The cause of your back pain may not be known. Some common causes of back pain include:  Strain of the muscles or ligaments supporting the spine.  Wear and tear (degeneration) of the spinal disks.  Arthritis.  Direct injury to the back. For many people, back pain may return. Since back pain is rarely dangerous, most people can learn to manage this condition on their own. HOME CARE INSTRUCTIONS Watch your back pain for any changes. The following actions may help to lessen any discomfort you are feeling:  Remain active. It is stressful on your back to sit or stand in one place for long periods of time. Do not sit, drive, or stand in one place for more than 30 minutes at a time. Take short walks on even  surfaces as soon as you are able.Try to increase the length of time you walk each day.  Exercise regularly as directed by your health care provider. Exercise helps your back heal faster. It also helps avoid future injury by keeping your muscles strong and flexible.  Do not stay in bed.Resting more than 1-2 days can delay your recovery.  Pay attention to your body when you bend and lift. The most comfortable positions are those that put less stress on your recovering back. Always use proper lifting techniques, including:  Bending your knees.  Keeping the load close to your body.  Avoiding twisting.  Find a comfortable position to sleep. Use a firm mattress and lie on your side with your knees slightly bent. If you lie on your back, put a pillow under your knees.  Avoid feeling anxious or stressed.Stress increases muscle tension and can worsen back pain.It is important to recognize when you are anxious or stressed and learn ways to manage it, such as with exercise.  Take medicines only as directed by your health care provider. Over-the-counter medicines to reduce pain and inflammation are often the most helpful.Your health care provider may prescribe muscle  relaxant drugs.These medicines help dull your pain so you can more quickly return to your normal activities and healthy exercise.  Apply ice to the injured area:  Put ice in a plastic bag.  Place a towel between your skin and the bag.  Leave the ice on for 20 minutes, 2-3 times a day for the first 2-3 days. After that, ice and heat may be alternated to reduce pain and spasms.  Maintain a healthy weight. Excess weight puts extra stress on your back and makes it difficult to maintain good posture. SEEK MEDICAL CARE IF:  You have pain that is not relieved with rest or medicine.  You have increasing pain going down into the legs or buttocks.  You have pain that does not improve in one week.  You have night pain.  You lose  weight.  You have a fever or chills. SEEK IMMEDIATE MEDICAL CARE IF:   You develop new bowel or bladder control problems.  You have unusual weakness or numbness in your arms or legs.  You develop nausea or vomiting.  You develop abdominal pain.  You feel faint.   This information is not intended to replace advice given to you by your health care provider. Make sure you discuss any questions you have with your health care provider.   Document Released: 07/25/2005 Document Revised: 08/15/2014 Document Reviewed: 11/26/2013 Elsevier Interactive Patient Education 2016 [place] Injury Prevention Back injuries can be very painful. They can also be difficult to heal. After having one back injury, you are more likely to injure your back again. It is important to learn how to avoid injuring or re-injuring your back. The following tips can help you to prevent a back injury. WHAT SHOULD I KNOW ABOUT PHYSICAL FITNESS?  Exercise for 30 minutes per day on most days of the week or as told by your doctor. Make sure to:  Do aerobic exercises, such as walking, jogging, biking, or swimming.  Do exercises that increase balance and strength, such as tai chi and yoga.  Do stretching exercises. This helps with flexibility.  Try to develop strong belly (abdominal) muscles. Your belly muscles help to support your back.  Stay at a healthy weight. This helps to decrease your risk of a back injury. WHAT SHOULD I KNOW ABOUT MY DIET?  Talk with your doctor about your overall diet. Take supplements and vitamins only as told by your doctor.  Talk with your doctor about how much calcium and vitamin D you need each day. These nutrients help to prevent weakening of the bones (osteoporosis).  Include good sources of calcium in your diet, such as:  Dairy products.  Green leafy vegetables.  Products that have had calcium added to them (fortified).  Include good sources of vitamin D in your  diet, such as:  Milk.  Foods that have had vitamin D added to them. WHAT SHOULD I KNOW ABOUT MY POSTURE?  Sit up straight and stand up straight. Avoid leaning forward when you sit or hunching over when you stand.  Choose chairs that have good low-back (lumbar) support.  If you work at a desk, sit close to it so you do not need to lean over. Keep your chin tucked in. Keep your neck drawn back. Keep your elbows bent so your arms look like the letter "L" (right angle).  Sit high and close to the steering wheel when you drive. Add a low-back support to your car seat, if needed.  Avoid sitting  or standing in one position for very long. Take breaks to get up, stretch, and walk around at least one time every hour. Take breaks every hour if you are driving for long periods of time.  Sleep on your side with your knees slightly bent, or sleep on your back with a pillow under your knees. Do not lie on the front of your body to sleep. WHAT SHOULD I KNOW ABOUT LIFTING, TWISTING, AND REACHING Lifting and Heavy Lifting  Avoid heavy lifting, especially lifting over and over again. If you must do heavy lifting:  Stretch before lifting.  Work slowly.  Rest between lifts.  Use a tool such as a cart or a dolly to move objects if one is available.  Make several small trips instead of carrying one heavy load.  Ask for help when you need it, especially when moving big objects.  Follow these steps when lifting:  Stand with your feet shoulder-width apart.  Get as close to the object as you can. Do not pick up a heavy object that is far from your body.  Use handles or lifting straps if they are available.  Bend at your knees. Squat down, but keep your heels off the floor.  Keep your shoulders back. Keep your chin tucked in. Keep your back straight.  Lift the object slowly while you tighten the muscles in your legs, belly, and butt. Keep the object as close to the center of your body as  possible.  Follow these steps when putting down a heavy load:  Stand with your feet shoulder-width apart.  Lower the object slowly while you tighten the muscles in your legs, belly, and butt. Keep the object as close to the center of your body as possible.  Keep your shoulders back. Keep your chin tucked in. Keep your back straight.  Bend at your knees. Squat down, but keep your heels off the floor.  Use handles or lifting straps if they are available. Twisting and Reaching  Avoid lifting heavy objects above your waist.  Do not twist at your waist while you are lifting or carrying a load. If you need to turn, move your feet.  Do not bend over without bending at your knees.  Avoid reaching over your head, across a table, or for an object on a high surface.  WHAT ARE SOME OTHER TIPS?  Avoid wet floors and icy ground. Keep sidewalks clear of ice to prevent falls.   Do not sleep on a mattress that is too soft or too hard.   Keep items that you use often within easy reach.   Put heavier objects on shelves at waist level, and put lighter objects on lower or higher shelves.  Find ways to lower your stress, such as:  Exercise.  Massage.  Relaxation techniques.  Talk with your doctor if you feel anxious or depressed. These conditions can make back pain worse.  Wear flat heel shoes with cushioned soles.  Avoid making quick (sudden) movements.  Use both shoulder straps when carrying a backpack.  Do not use any tobacco products, including cigarettes, chewing tobacco, or electronic cigarettes. If you need help quitting, ask your doctor.   This information is not intended to replace advice given to you by your health care provider. Make sure you discuss any questions you have with your health care provider.   Document Released: 01/11/2008 Document Revised: 12/09/2014 Document Reviewed: 07/29/2014 Elsevier Interactive Patient Education 2016 [employer]  Back  Exercises If you have  pain in your back, do these exercises 2-3 times each day or as told by your doctor. When the pain goes away, do the exercises once each day, but repeat the steps more times for each exercise (do more repetitions). If you do not have pain in your back, do these exercises once each day or as told by your doctor. EXERCISES Single Knee to Chest Do these steps 3-5 times in a row for each leg:  Lie on your back on a firm bed or the floor with your legs stretched out.  Bring one knee to your chest.  Hold your knee to your chest by grabbing your knee or thigh.  Pull on your knee until you feel a gentle stretch in your lower back.  Keep doing the stretch for 10-30 seconds.  Slowly let go of your leg and straighten it. Pelvic Tilt Do these steps 5-10 times in a row:  Lie on your back on a firm bed or the floor with your legs stretched out.  Bend your knees so they point up to the ceiling. Your feet should be flat on the floor.  Tighten your lower belly (abdomen) muscles to press your lower back against the floor. This will make your tailbone point up to the ceiling instead of pointing down to your feet or the floor.  Stay in this position for 5-10 seconds while you gently tighten your muscles and breathe evenly. Cat-Cow Do these steps until your lower back bends more easily:  Get on your hands and knees on a firm surface. Keep your hands under your shoulders, and keep your knees under your hips. You may put padding under your knees.  Let your head hang down, and make your tailbone point down to the floor so your lower back is round like the back of a cat.  Stay in this position for 5 seconds.  Slowly lift your head and make your tailbone point up to the ceiling so your back hangs low (sags) like the back of a cow.  Stay in this position for 5 seconds. Press-Ups Do these steps 5-10 times in a row:  Lie on your belly (face-down) on the floor.  Place your hands  near your head, about shoulder-width apart.  While you keep your back relaxed and keep your hips on the floor, slowly straighten your arms to raise the top half of your body and lift your shoulders. Do not use your back muscles. To make yourself more comfortable, you may change where you place your hands.  Stay in this position for 5 seconds.  Slowly return to lying flat on the floor. Bridges Do these steps 10 times in a row:  Lie on your back on a firm surface.  Bend your knees so they point up to the ceiling. Your feet should be flat on the floor.  Tighten your butt muscles and lift your butt off of the floor until your waist is almost as high as your knees. If you do not feel the muscles working in your butt and the back of your thighs, slide your feet 1-2 inches farther away from your butt.  Stay in this position for 3-5 seconds.  Slowly lower your butt to the floor, and let your butt muscles relax. If this exercise is too easy, try doing it with your arms crossed over your chest. Belly Crunches Do these steps 5-10 times in a row:  Lie on your back on a firm bed or the floor with your  legs stretched out.  Bend your knees so they point up to the ceiling. Your feet should be flat on the floor.  Cross your arms over your chest.  Tip your chin a little bit toward your chest but do not bend your neck.  Tighten your belly muscles and slowly raise your chest just enough to lift your shoulder blades a tiny bit off of the floor.  Slowly lower your chest and your head to the floor. Back Lifts Do these steps 5-10 times in a row: 1. Lie on your belly (face-down) with your arms at your sides, and rest your forehead on the floor. 2. Tighten the muscles in your legs and your butt. 3. Slowly lift your chest off of the floor while you keep your hips on the floor. Keep the back of your head in line with the curve in your back. Look at the floor while you do this. 4. Stay in this position for  3-5 seconds. 5. Slowly lower your chest and your face to the floor. GET HELP IF:  Your back pain gets a lot worse when you do an exercise.  Your back pain does not lessen 2 hours after you exercise. If you have any of these problems, stop doing the exercises. Do not do them again unless your doctor says it is okay. GET HELP RIGHT AWAY IF:  You have sudden, very bad back pain. If this happens, stop doing the exercises. Do not do them again unless your doctor says it is okay.   This information is not intended to replace advice given to you by your health care provider. Make sure you discuss any questions you have with your health care provider.   Document Released: 08/27/2010 Document Revised: 04/15/2015 Document Reviewed: 09/18/2014 Elsevier Interactive Patient Education 2016 [place] therapy can help ease sore, stiff, injured, and tight muscles and joints. Heat relaxes your muscles, which may help ease your pain.  RISKS AND COMPLICATIONS If you have any of the following conditions, do not use heat therapy unless your health care provider has approved:  Poor circulation.  Healing wounds or scarred skin in the area being treated.  Diabetes, heart disease, or high blood pressure.  Not being able to feel (numbness) the area being treated.  Unusual swelling of the area being treated.  Active infections.  Blood clots.  Cancer.  Inability to communicate pain. This may include young children and people who have problems with their brain function (dementia).  Pregnancy. Heat therapy should only be used on old, pre-existing, or long-lasting (chronic) injuries. Do not use heat therapy on new injuries unless directed by your health care provider. HOW TO USE HEAT THERAPY There are several different kinds of heat therapy, including:  Moist heat pack.  Warm water bath.  Hot water bottle.  Electric heating pad.  Heated gel pack.  Heated  wrap.  Electric heating pad. Use the heat therapy method suggested by your health care provider. Follow your health care provider's instructions on when and how to use heat therapy. GENERAL HEAT THERAPY RECOMMENDATIONS  Do not sleep while using heat therapy. Only use heat therapy while you are awake.  Your skin may turn pink while using heat therapy. Do not use heat therapy if your skin turns red.  Do not use heat therapy if you have new pain.  High heat or long exposure to heat can cause burns. Be careful when using heat therapy to avoid burning your skin.  Do not use heat therapy on areas of your skin that are already irritated, such as with a rash or sunburn. SEEK MEDICAL CARE IF:  You have blisters, redness, swelling, or numbness.  You have new pain.  Your pain is worse. MAKE SURE YOU:  Understand these instructions.  Will watch your condition.  Will get help right away if you are not doing well or get worse.   This information is not intended to replace advice given to you by your health care provider. Make sure you discuss any questions you have with your health care provider.   Document Released: 10/17/2011 Document Revised: 08/15/2014 Document Reviewed: 09/17/2013 Elsevier Interactive Patient Education 2016 [employer]  Muscle Cramps and Spasms Muscle cramps and spasms are when muscles tighten by themselves. They usually get better within minutes. Muscle cramps are painful. They are usually stronger and last longer than muscle spasms. Muscle spasms may or may not be painful. They can last a few seconds or much longer. HOME CARE  Drink enough fluid to keep your pee (urine) clear or pale yellow.  Massage, stretch, and relax the muscle.  Use a warm towel, heating pad, or warm shower water on tight muscles.  Place ice on the muscle if it is tender or in pain.  Put ice in a plastic bag.  Place a towel between your skin and the bag.  Leave the ice on for 15-20  minutes, 03-04 times a day.  Only take medicine as told by your doctor. GET HELP RIGHT AWAY IF:  Your cramps or spasms get worse, happen more often, or do not get better with time. MAKE SURE YOU:  Understand these instructions.  Will watch your condition.  Will get help right away if you are not doing well or get worse.   This information is not intended to replace advice given to you by your health care provider. Make sure you discuss any questions you have with your health care provider.   Document Released: 07/07/2008 Document Revised: 11/19/2012 Document Reviewed: 07/11/2012 Elsevier Interactive Patient Education 2016 [employer]  Lactose Intolerance, Adult Lactose is the natural sugar found in milk and milk products, such as cheese and yogurt. Lactose is digested by lactase, an enzyme in your small intestine. Some people do not produce enough lactase to digest lactose. This is called lactose intolerance. Lactose intolerance is different from milk allergy, which is a more serious reaction to the protein in milk.  CAUSES Causes of lactose intolerance may include:   Normal aging. The ability to produce lactase may decline with age, causing lactose intolerance over time.  Being born without the ability to make lactase.   Digestive diseases such as gastroenteritis or inflammatory bowel disease.  Surgery or injuries to your small intestine.  Infection in your intestines.  Certain antibiotic medicines and cancer treatments. SIGNS AND SYMPTOMS  Lactose intolerance can cause uncomfortable symptoms. These are likely to occur within 30 minutes to 2 hours after eating or drinking foods containing lactose. Symptoms of lactose intolerance may include:  Nausea.  Diarrhea.  Abdominal cramps or pain.  Bloating.   Gas.  DIAGNOSIS  There are several tests your health care provider can do to diagnose lactose intolerance. These tests include a hydrogen breath test and stool  acidity test.  TREATMENT  No treatment can improve your body's ability to produce lactase. However, your symptoms can be controlled by limiting or avoiding milk products and other sources of lactose and adjusting your diet. Lactose-free milk  is often tolerated. Lactose digestion may also be improved by adding lactase drops to regular milk or by taking lactase tablets when dairy products are consumed. Tolerance to lactose is individual. Some people may be able to eat or drink small amounts of products with lactose, while other may need to avoid lactose entirely. Talk to your health care provider about what is best for you.  HOME CARE INSTRUCTIONS  Limit or avoidfoods, beverages, and medicines containing lactose as directed by your health care provider.   Read food and medicine labels carefully to avoid products containing lactose, milk solids, casein, or whey.  If you eliminate dairy products, replace the protein, calcium, vitamin D, and other nutrients they contain through other foods. A registered dietitian or your health care provider can help you adjust your diet.  Choose a milk substitute that is fortified with calcium and vitamin D. Be aware that soy milk contains high quality protein, while milks made from nuts or grains contain very little protein.  Use lactase drops or tablets if directed by your health care provider. SEEK MEDICAL CARE IF: You have no relief from your symptoms after eliminating milk products and other sources of lactose.    This information is not intended to replace advice given to you by your health care provider. Make sure you discuss any questions you have with your health care provider.   Document Released: 07/25/2005 Document Revised: 08/15/2014 Document Reviewed: 10/25/2013 Elsevier Interactive Patient Education 2016 [employer]  Lactose-Free Diet, Adult If you have lactose intolerance, you are not able to digest lactose. Lactose is a natural sugar found  mainly in milk and milk products. You may need to avoid all foods and beverages that contain lactose. A lactose-free diet can help you do this.  WHAT DO I NEED TO KNOW ABOUT THIS DIET?  Do not consume foods, beverages, vitamins, minerals, or medicines with lactose. Read ingredients lists carefully.  Look for the words "lactose-free" on labels.  Use lactase enzyme drops or tablets as directed by your health care provider.  Use lactose-free milk or a milk alternative, such as soy milk, for drinking and cooking.  Make sure you get enough calcium and vitamin D in your diet. A lactose-free eating plan can be lacking in these important nutrients.  Take calcium and vitamin D supplements as directed by your health care provider. Talk to your provider about supplements if you are not able to get enough calcium and vitamin D from food. WHICH FOODS HAVE LACTOSE? Lactose is found in:   Milk and foods made from milk.  Yogurt.   Cheese.  Butter.   Margarine.   Sour cream.   Cream.   Whipped toppings and nondairy creamers.  Ice cream and other milk-based desserts. Lactose is also found in foods or products made with milk or milk ingredients. To find out whether a food contains milk or a milk ingredient, look at the ingredients list. Avoid foods with the statement "May contain milk" and foods that contain:   Butter.   Cream.  Milk.  Milk solids.  Milk powder.   Whey.  Curd.  Caseinate.  Lactose.  Lactalbumin.  Lactoglobulin. WHAT ARE SOME ALTERNATIVES TO MILK AND FOODS MADE WITH MILK PRODUCTS?  Lactose-free milk.  Soy milk with added calcium and vitamin D.  Almond, coconut, or rice milk with added calcium and vitamin D. Note that these are low in protein.   Soy products, such as soy yogurt, soy cheese, soy ice cream, and soy-based  sour cream. WHICH FOODS CAN I EAT? Grains Breads and rolls made without milk, such as [place], [place], or [place] bread,  bagels, pita, and [employer]. Corn tortillas, corn meal, grits, and polenta. Crackers without lactose or milk solids, such as soda crackers and graham crackers. Cooked or dry cereals without lactose or milk solids. Pasta, quinoa, couscous, barley, oats, bulgur, farro, rice, wild rice, or other grains prepared without milk or lactose. Plain popcorn.  Vegetables Fresh, frozen, and canned vegetables without cheese, cream, or butter sauces. Fruits All fresh, canned, frozen, or dried fruits that are not processed with lactose. Meats and Other Protein Sources Plain beef, chicken, fish, [place], lamb, veal, pork, wild game, or ham. Kosher-prepared meat products. Strained or junior meats that do not contain milk. Eggs. Soy meat substitutes. Beans, lentils, and hummus. Tofu. Nuts and seeds. Peanut or other nut butters without lactose. Soups, casseroles, and mixed dishes without cheese, cream, or milk.  Dairy Lactose-free milk. Soy, rice, or almond milk with added calcium and vitamin D. Soy cheese and yogurt. Beverages Carbonated drinks. Tea. Coffee, freeze-dried coffee, and some instant coffees. Fruit and vegetable juices.  Condiments Soy sauce. Carob powder. Olives. Gravy made with water. Baker's cocoa. Angie Fava. Pure seasonings and spices. Ketchup. Mustard. Bouillon. Broth.  Sweets and Desserts Water and fruit ices. Gelatin. Cookies, pies, or cakes made from allowed ingredients, such as angel food cake. Pudding made with water or a milk substitute. Lactose-free tofu desserts. Soy, coconut milk, or rice-milk-based frozen desserts. Sugar. Honey. Jam, jelly, and marmalade. Molasses. Pure sugar candy. Dark chocolate without milk. Marshmallows.  Fats and Oils Margarines and salad dressings that do not contain milk. Berniece Salines. Vegetable oils. Shortening. Mayonnaise. Soy or coconut-based cream.  The items listed above may not be a complete list of recommended foods or beverages. Contact your dietitian for more options.   WHICH FOODS ARE NOT RECOMMENDED? Grains Breads and rolls that contain milk. Toaster pastries. Muffins, biscuits, waffles, cornbread, and pancakes. These can be prepared at home, commercial, or from mixes. Sweet rolls, donuts, English muffins, fry bread, lefse, flour tortillas with lactose, or [place] toast made with milk or milk ingredients. Crackers that contain lactose. Corn curls. Cooked or dry cereals with lactose. Vegetables Creamed or breaded vegetables. Vegetables in a cheese or butter sauce or with lactose-containing margarines. Instant potatoes. [place] fries. Scalloped or au gratin potatoes.  Fruits None.  Meats and Other Protein Sources Scrambled eggs, omelets, and souffles that contain milk. Creamed or breaded meat, fish, chicken, or [place]. Sausage products, such as wieners and liver sausage. Cold cuts that contain milk solids. Cheese, cottage cheese, ricotta cheese, and cheese spreads. Lasagna and macaroni and cheese. Pizza. Peanut or other nut butters with added milk solids. Casseroles or mixed dishes containing milk or cheese.  Dairy All dairy products, including milk, goat's milk, buttermilk, kefir, acidophilus milk, flavored milk, evaporated milk, condensed milk, dulce de [place], eggnog, yogurt, cheese, and cheese spreads.  Beverages Hot chocolate. Cocoa with lactose. Instant iced teas. Powdered fruit drinks. Smoothies made with milk or yogurt.  Condiments Chewing gum that has lactose. Cocoa that has lactose. Spice blends if they contain milk products. Artificial sweeteners that contain lactose. Nondairy creamers.  Sweets and Desserts Ice cream, ice milk, gelato, sherbet, and frozen yogurt. Custard, pudding, and mousse. Cake, cream pies, cookies, and other desserts containing milk, cream, cream cheese, or milk chocolate. Pie crust made with milk-containing margarine or butter. Reduced-calorie desserts made with a sugar substitute that contains lactose. Toffee and butterscotch.  Milk,  white, or dark chocolate that contains milk. Fudge. Caramel.  Fats and Oils Margarines and salad dressings that contain milk or cheese. Cream. Half and half. Cream cheese. Sour cream. Chip dips made with sour cream or yogurt.  The items listed above may not be a complete list of foods and beverages to avoid. Contact your dietitian for more information. AM I GETTING ENOUGH CALCIUM? Calcium is found in many foods that contain lactose and is important for bone health. The amount of calcium you need depends on your age:   Adults younger than 50 years: 1000 mg of calcium a day.  Adults older than 50 years: 1200 mg of calcium a day. If you are not getting enough calcium, other calcium sources include:   Orange juice with calcium added. There are 300-350 mg of calcium in 1 cup of orange juice.   Sardines with edible bones. There are 325 mg of calcium in 3 oz of sardines.   Calcium-fortified soy milk. There are 300-400 mg of calcium in 1 cup of calcium-fortified soy milk.  Calcium-fortified rice or almond milk. There are 300 mg of calcium in 1 cup of calcium-fortified rice or almond milk.  Canned salmon with edible bones. There are 180 mg of calcium in 3 oz of canned salmon with edible bones.   Calcium-fortified breakfast cereals. There are [id number] mg of calcium in calcium-fortified breakfast cereals.   Tofu set with calcium sulfate. There are 250 mg of calcium in  cup of tofu set with calcium sulfate.  Spinach, cooked. There are 145 mg of calcium in  cup of cooked spinach.  Edamame, cooked. There are 130 mg of calcium in  cup of cooked edamame.  Collard greens, cooked. There are 125 mg of calcium in  cup of cooked collard greens.  Kale, frozen or cooked. There are 90 mg of calcium in  cup of cooked or frozen kale.   Almonds. There are 95 mg of calcium in  cup of almonds.  Broccoli, cooked. There are 60 mg of calcium in 1 cup of cooked broccoli.   This information is not  intended to replace advice given to you by your health care provider. Make sure you discuss any questions you have with your health care provider.   Document Released: 01/14/2002 Document Revised: 12/09/2014 Document Reviewed: 10/25/2013 Elsevier Interactive Patient Education 2016 [employer] Inc.  Diarrhea Diarrhea is frequent loose and watery bowel movements. It can cause you to feel weak and dehydrated. Dehydration can cause you to become tired and thirsty, have a dry mouth, and have decreased urination that often is dark yellow. Diarrhea is a sign of another problem, most often an infection that will not last long. In most cases, diarrhea typically lasts 2-3 days. However, it can last longer if it is a sign of something more serious. It is important to treat your diarrhea as directed by your caregiver to lessen or prevent future episodes of diarrhea. CAUSES  Some common causes include:  Gastrointestinal infections caused by viruses, bacteria, or parasites.  Food poisoning or food allergies.  Certain medicines, such as antibiotics, chemotherapy, and laxatives.  Artificial sweeteners and fructose.  Digestive disorders. HOME CARE INSTRUCTIONS  Ensure adequate fluid intake (hydration): Have 1 cup (8 oz) of fluid for each diarrhea episode. Avoid fluids that contain simple sugars or sports drinks, fruit juices, whole milk products, and sodas. Your urine should be clear or pale yellow if you are drinking enough fluids. Hydrate with an oral rehydration solution  that you can purchase at pharmacies, retail stores, and online. You can prepare an oral rehydration solution at home by mixing the following ingredients together:   - tsp table salt.   tsp baking soda.   tsp salt substitute containing potassium chloride.  1  tablespoons sugar.  1 L (34 oz) of water.  Certain foods and beverages may increase the speed at which food moves through the gastrointestinal (GI) tract. These foods and  beverages should be avoided and include:  Caffeinated and alcoholic beverages.  High-fiber foods, such as raw fruits and vegetables, nuts, seeds, and whole grain breads and cereals.  Foods and beverages sweetened with sugar alcohols, such as xylitol, sorbitol, and mannitol.  Some foods may be well tolerated and may help thicken stool including:  Starchy foods, such as rice, toast, pasta, low-sugar cereal, oatmeal, grits, baked potatoes, crackers, and bagels.  Bananas.  Applesauce.  Add probiotic-rich foods to help increase healthy bacteria in the GI tract, such as yogurt and fermented milk products.  Wash your hands well after each diarrhea episode.  Only take over-the-counter or prescription medicines as directed by your caregiver.  Take a warm bath to relieve any burning or pain from frequent diarrhea episodes. SEEK IMMEDIATE MEDICAL CARE IF:   You are unable to keep fluids down.  You have persistent vomiting.  You have blood in your stool, or your stools are black and tarry.  You do not urinate in 6-8 hours, or there is only a small amount of very dark urine.  You have abdominal pain that increases or localizes.  You have weakness, dizziness, confusion, or light-headedness.  You have a severe headache.  Your diarrhea gets worse or does not get better.  You have a fever or persistent symptoms for more than 2-3 days.  You have a fever and your symptoms suddenly get worse. MAKE SURE YOU:   Understand these instructions.  Will watch your condition.  Will get help right away if you are not doing well or get worse.   This information is not intended to replace advice given to you by your health care provider. Make sure you discuss any questions you have with your health care provider.   Document Released: 07/15/2002 Document Revised: 08/15/2014 Document Reviewed: 04/01/2012 Elsevier Interactive Patient Education 2016 [employer] Inc.  Abdominal Pain, Adult Many  things can cause abdominal pain. Usually, abdominal pain is not caused by a disease and will improve without treatment. It can often be observed and treated at home. Your health care provider will do a physical exam and possibly order blood tests and X-rays to help determine the seriousness of your pain. However, in many cases, more time must pass before a clear cause of the pain can be found. Before that point, your health care provider may not know if you need more testing or further treatment. HOME CARE INSTRUCTIONS Monitor your abdominal pain for any changes. The following actions may help to alleviate any discomfort you are experiencing:  Only take over-the-counter or prescription medicines as directed by your health care provider.  Do not take laxatives unless directed to do so by your health care provider.  Try a clear liquid diet (broth, tea, or water) as directed by your health care provider. Slowly move to a bland diet as tolerated. SEEK MEDICAL CARE IF:  You have unexplained abdominal pain.  You have abdominal pain associated with nausea or diarrhea.  You have pain when you urinate or have a bowel movement.  You  experience abdominal pain that wakes you in the night.  You have abdominal pain that is worsened or improved by eating food.  You have abdominal pain that is worsened with eating fatty foods.  You have a fever. SEEK IMMEDIATE MEDICAL CARE IF:  Your pain does not go away within 2 hours.  You keep throwing up (vomiting).  Your pain is felt only in portions of the abdomen, such as the right side or the left lower portion of the abdomen.  You pass bloody or black tarry stools. MAKE SURE YOU:  Understand these instructions.  Will watch your condition.  Will get help right away if you are not doing well or get worse.   This information is not intended to replace advice given to you by your health care provider. Make sure you discuss any questions you have with  your health care provider.   Document Released: 05/04/2005 Document Revised: 04/15/2015 Document Reviewed: 04/03/2013 Elsevier Interactive Patient Education [employer].

## 2015-09-24 NOTE — ED Provider Notes (Signed)
CSN: [id number]     Arrival date & time 09/24/15  1619 History  By signing my name below, I, Vicki [name], attest that this documentation has been prepared under the direction and in the presence of [address] [employer]. Electronically Signed: Eustaquio [name], ED Scribe. 09/24/2015. 7:03 PM.   Chief Complaint  Patient presents with  . Back Pain   Patient is a 55 y.o. female presenting with back pain. The history is provided by the patient and medical records. No language interpreter was used.  Back Pain Location:  Lumbar spine Quality:  Aching Radiates to:  Does not radiate Pain severity:  Moderate (5/10) Pain is:  Same all the time Onset quality:  Gradual Duration:  3 days Timing:  Constant Progression:  Worsening Chronicity:  Recurrent Context: physical stress (bending over to put her dog on a leash)   Context: not lifting heavy objects, not recent illness and not recent injury   Relieved by:  Nothing Worsened by:  Lying down and movement Ineffective treatments:  OTC medications, muscle relaxants and heating pad Associated symptoms: abdominal pain   Associated symptoms: no bladder incontinence, no bowel incontinence, no chest pain, no dysuria, no fever, no numbness, no paresthesias, no perianal numbness and no weakness   Abdominal pain:    Location:  LLQ and RLQ   Severity:  Moderate   Onset quality:  Gradual   Duration:  1 day   Timing:  Constant   Progression:  Unchanged   Chronicity:  New Risk factors: no hx of cancer      HPI Comments: Vicki [name] is a 55 y.o. female with PMHx of chronic back pain as well as other medical conditions listed below, and a PSHx of tubal ligation, who presents to the Emergency Department complaining of gradual onset, constant, 5/10, achy, nonradiating lower back pain x 3 days. No known recent injury to the back but pt does report that about 1 month ago she slipped on ice outside and fell onto her left hip. she states she does a lot of  bending/twisting when she puts her dog on a leash everyday, isn't sure if this may have caused the pain. The pain is exacerbated with laying down and movement. Pt has applied a heating pad without relief. She also took Flexeril and OTC pain medication without relief. Pt has hx of chronic back pain and reports the pain is in the same location but is more severe. She states that she has felt nauseous since the back pain began.   Pt also mentions that she began having achy lower abdominal pain last night shortly after eating ice cream. She took a laxative to try and relieve the pain without relief. Pt mentions that after taking the laxative she has had 3 episodes of nonbloody loose stools in the past 24 hours. No recent travel, recent sick contact with similar symptoms, recent suspicious food intake aside from ice cream, or recent EtOH or NSAID usage. She admits that she has issues eating dairy, and that sometimes she will get these symptoms when she has dairy products like milk.  Denies fever, chills, chest pain, shortness of breath, vomiting, constipation, obstipation, hematochezia, melena, dysuria, hematuria, flank pain, urinary or bowel incontinence, saddle anesthesia/cauda equina symptoms, weakness, numbness, tingling, vaginal bleeding, vaginal discharge, or any other associated symptoms. No hx of CA or IVDU. She was previously on mobic for her back and shoulder (R rotator cuff tear) but she hasn't taken it in quite some time. Her PCP  is Vicki Sat, MD at [facility].  Past Medical History  Diagnosis Date  . DEPRESSIVE DISORDER NOT ELSEWHERE CLASSIFIED   . SHOULDER PAIN, RIGHT   . Rotator cuff tear, right   . Sleep disorder   . Acquired hallux valgus of left foot   . Onychomycosis   . Foot deformity, acquired   . Callous ulcer ([place])   . Myalgia and myositis, unspecified   . Rotator cuff syndrome of left shoulder   . Depression with anxiety   . Oral thrush   . Dyspnea   .  Shortness of breath   . Urinary hesitancy   . Grief reaction    Past Surgical History  Procedure Laterality Date  . Tubal ligation     Family History  Problem Relation Age of Onset  . Heart disease Mother   . Diabetes Mother   . Cancer Mother   . Heart disease Father    Social History  Substance Use Topics  . Smoking status: Current Every Day Smoker -- 1.00 packs/day    Types: Cigarettes  . Smokeless tobacco: Never Used     Comment: increased smoking again due to stress  . Alcohol Use: No   OB History    No data available     Review of Systems  Constitutional: Negative for fever and chills.  Respiratory: Negative for shortness of breath.   Cardiovascular: Negative for chest pain.  Gastrointestinal: Positive for nausea, abdominal pain and diarrhea. Negative for vomiting, constipation, blood in stool and bowel incontinence.       Negative for bowel incontinence  Genitourinary: Negative for bladder incontinence, dysuria, hematuria, flank pain, vaginal bleeding and vaginal discharge.       Negative for urinary incontinence  Musculoskeletal: Positive for back pain.  Skin: Negative for wound.  Allergic/Immunologic: Negative for immunocompromised state.  Neurological: Negative for weakness, numbness and paresthesias.       Negative for saddle anesthesia  10 Systems reviewed and all are negative for acute change except as noted in the HPI.   Allergies  Review of patient's allergies indicates no known allergies.  Home Medications   Prior to Admission medications   Medication Sig Start Date End Date Taking? Authorizing Provider  albuterol (PROVENTIL HFA;VENTOLIN HFA) 108 (90 BASE) MCG/ACT inhaler Inhale 2 puffs into the lungs every 6 (six) hours as needed for wheezing or shortness of breath. 10/28/14   Willeen Niece, MD  busPIRone (BUSPAR) 10 MG tablet Take 0.5 tablets (5 mg total) by mouth 2 (two) times daily. 12/01/14   Leone Brand, MD  cyclobenzaprine (FLEXERIL) 10 MG  tablet Take 1 tablet (10 mg total) by mouth 3 (three) times daily as needed for muscle spasms. 12/01/14   Leone Brand, MD  meloxicam (MOBIC) 7.5 MG tablet TAKE TWO TABLETS BY MOUTH ONCE DAILY 08/12/14   Willeen Niece, MD  PARoxetine (PAXIL) 10 MG tablet Take 1 tablet (10 mg total) by mouth every morning. 10/28/14   Willeen Niece, MD   BP 120/81 mmHg  Pulse 101  Temp(Src) 98 F (36.7 C) (Oral)  Resp 16  SpO2 95%   Physical Exam  Constitutional: She is oriented to person, place, and time. Vital signs are normal. She appears well-developed and well-nourished.  Non-toxic appearance. No distress.  Afebrile, nontoxic, NAD  HENT:  Head: Normocephalic and atraumatic.  Mouth/Throat: Oropharynx is clear and moist and mucous membranes are normal.  Eyes: Conjunctivae and EOM are normal. Right eye exhibits  no discharge. Left eye exhibits no discharge.  Neck: Normal range of motion. Neck supple.  Cardiovascular: Normal rate, regular rhythm, normal heart sounds and intact distal pulses.  Exam reveals no gallop and no friction rub.   No murmur heard. Pulmonary/Chest: Effort normal and breath sounds normal. No respiratory distress. She has no decreased breath sounds. She has no wheezes. She has no rhonchi. She has no rales.  Abdominal: Soft. Normal appearance and bowel sounds are normal. She exhibits no distension. There is no tenderness. There is no rigidity, no rebound, no guarding, no CVA tenderness, no tenderness at McBurney's point and negative Murphy's sign.  Soft, NTND, +BS throughout, no r/g/r, neg murphy's, neg mcburney's, no CVA TTP  Musculoskeletal:       Lumbar back: She exhibits decreased range of motion (due to pain), tenderness and spasm. She exhibits no bony tenderness and no deformity.       Back:  Lumbar spine with minimally limited ROM due to pain without spinous process TTP, no bony stepoffs or deformities, with mild bilateral paraspinous muscle TTP and muscle spasms. Strength 5/5 in  all extremities, sensation grossly intact in all extremities, negative SLR bilaterally, gait steady and nonantalgic. No overlying skin changes. Distal pulses intact.  Neurological: She is alert and oriented to person, place, and time. She has normal strength. No sensory deficit.  Skin: Skin is warm, dry and intact. No rash noted.  Psychiatric: She has a normal mood and affect.  Nursing note and vitals reviewed.   ED Course  Procedures (including critical care time)  DIAGNOSTIC STUDIES: Oxygen Saturation is 96% on RA, normal by my interpretation.    COORDINATION OF CARE: 6:55 PM-Discussed treatment plan which includes UA with pt at bedside and pt agreed to plan.   Labs Review Labs Reviewed  COMPREHENSIVE METABOLIC PANEL - Abnormal; Notable for the following:    Glucose, Bld 166 (*)    Total Protein 6.1 (*)    All other components within normal limits  LIPASE, BLOOD  CBC  URINALYSIS, ROUTINE W REFLEX MICROSCOPIC (NOT AT [facility])    Imaging Review No results found. I have personally reviewed and evaluated these lab results as part of my medical decision-making.   EKG Interpretation None      MDM   Final diagnoses:  Bilateral low back pain without sciatica  Back muscle spasm  Lower abdominal pain  Diarrhea, unspecified type  Lactose intolerance  Hyperglycemia, unspecified  Nausea     55 y.o. female here with acute on chronic low back pain after several days of bending over to help her dog onto a leash, also with 1 day of lower abd pain after eating ice cream, then taking laxative which caused diarrhea. On exam, no focal abd tenderness, no flank tenderness. No midline spinal tenderness, just slight spasm and tenderness to b/l paraspinous muscles in lumbar area. No red flag s/s of low back pain. No s/s of central cord compression or cauda equina. Lower extremities are neurovascularly intact and patient is ambulating without difficulty. Labs all unremarkable aside from mildly  elevated glucose which has been noted in the past, no formal diagnosis of diabetes, will need to f/up with PCP for possible HgbA1C at her next visit.   Will await U/A which has not yet been obtained, and give mobic, robaxin, and norco for pain. Will reassess after this. Will likely send home with robaxin/mobic/norco. Discussed use of lactaid for lactose intolerance issue, and avoidance of dairy, as well as stopping the  laxative which likely caused the diarrhea. Doubt need for further work up/imaging today, but will reassess after U/A.   8:18 PM U/A negative. Patient was counseled on back pain precautions and told to do activity as tolerated but do not lift, push, or pull heavy objects more than 10 pounds for the next week. Patient counseled to use ice or heat on back for no longer than 15 minutes every hour.   Rx given for a different muscle relaxer and counseled on proper use of muscle relaxant medication. Rx given for narcotic pain medicine and counseled on proper use of narcotic pain medications. Told that they can increase to every 4 hrs if needed while pain is worse. Counseled not to combine this medication with others containing tylenol. Urged patient not to drink alcohol, drive, or perform any other activities that requires focus while taking either of these medications. Rx for mobic given.  Discussed lactaid and avoiding dairy. Discussed phenergan use for nausea.  Patient urged to follow-up with PCP in 1wk or sooner if pain does not improve with treatment and rest or if pain becomes recurrent. Urged to return with worsening severe pain, loss of bowel or bladder control, trouble walking. The patient verbalizes understanding and agrees with the plan.   I personally performed the services described in this documentation, which was scribed in my presence. The recorded information has been reviewed and is accurate.   BP 120/81 mmHg  Pulse 101  Temp(Src) 98 F (36.7 C) (Oral)  Resp 16  SpO2  95%  Meds ordered this encounter  Medications  . methocarbamol (ROBAXIN) tablet 500 mg    Sig:   . HYDROcodone-acetaminophen (NORCO/VICODIN) 5-325 MG per tablet 1 tablet    Sig:   . meloxicam (MOBIC) tablet 15 mg    Sig:   . meloxicam (MOBIC) 15 MG tablet    Sig: Take 1 tablet (15 mg total) by mouth daily.    Dispense:  30 tablet    Refill:  1    Order Specific Question:  Supervising Provider    Answer:  MILLER, BRIAN [[id number]]  . methocarbamol (ROBAXIN) 500 MG tablet    Sig: Take 1 tablet (500 mg total) by mouth every 8 (eight) hours as needed for muscle spasms.    Dispense:  15 tablet    Refill:  0    Order Specific Question:  Supervising Provider    Answer:  MILLER, BRIAN [[id number]]  . HYDROcodone-acetaminophen (NORCO) 5-325 MG tablet    Sig: Take 1 tablet by mouth every 6 (six) hours as needed for moderate pain or severe pain.    Dispense:  8 tablet    Refill:  0    Order Specific Question:  Supervising Provider    Answer:  MILLER, BRIAN [[id number]]  . promethazine (PHENERGAN) 25 MG tablet    Sig: Take 1 tablet (25 mg total) by mouth every 6 (six) hours as needed for nausea or vomiting.    Dispense:  10 tablet    Refill:  0    Order Specific Question:  Supervising Provider    Answer:  Noemi Chapel [[id number]]     Arminta Gamm Camprubi-Soms, PA-C 09/24/15 2020  Ezequiel Essex, MD 09/24/15 2231

## 2015-09-24 NOTE — ED Notes (Signed)
Pt reports to the ED for eval of low back pain since Tuesday. Denies any recent injury since she fell when it snowed. Pt denies any numbness, tingling, or bowel or bladder incontinence. Pt also reports lower abd pain after she ate some ice cream. Pt reports diarrhea. Denies any N/V, vaginal d/c, bleeding, or urinary symptoms. Pt A&Ox4, resp e/u, and skin warm and dry.

## 2015-10-01 ENCOUNTER — Encounter (HOSPITAL_COMMUNITY): Payer: Self-pay | Admitting: *Deleted

## 2015-10-01 ENCOUNTER — Emergency Department (HOSPITAL_COMMUNITY)
Admission: EM | Admit: 2015-10-01 | Discharge: 2015-10-01 | Disposition: A | Discharge status: Home / Self Care | Payer: Medicare Other | Attending: Emergency Medicine | Admitting: Emergency Medicine

## 2015-10-01 DIAGNOSIS — Z791 Long term (current) use of non-steroidal anti-inflammatories (NSAID): Secondary | ICD-10-CM | POA: Diagnosis not present

## 2015-10-01 DIAGNOSIS — Z872 Personal history of diseases of the skin and subcutaneous tissue: Secondary | ICD-10-CM | POA: Insufficient documentation

## 2015-10-01 DIAGNOSIS — Z8619 Personal history of other infectious and parasitic diseases: Secondary | ICD-10-CM | POA: Diagnosis not present

## 2015-10-01 DIAGNOSIS — S29002A Unspecified injury of muscle and tendon of back wall of thorax, initial encounter: Secondary | ICD-10-CM | POA: Insufficient documentation

## 2015-10-01 DIAGNOSIS — T148 Other injury of unspecified body region: Secondary | ICD-10-CM | POA: Diagnosis not present

## 2015-10-01 DIAGNOSIS — S0990XA Unspecified injury of head, initial encounter: Secondary | ICD-10-CM | POA: Diagnosis not present

## 2015-10-01 DIAGNOSIS — Y9389 Activity, other specified: Secondary | ICD-10-CM | POA: Insufficient documentation

## 2015-10-01 DIAGNOSIS — F17210 Nicotine dependence, cigarettes, uncomplicated: Secondary | ICD-10-CM | POA: Diagnosis not present

## 2015-10-01 DIAGNOSIS — S199XXA Unspecified injury of neck, initial encounter: Secondary | ICD-10-CM | POA: Diagnosis present

## 2015-10-01 DIAGNOSIS — Z79899 Other long term (current) drug therapy: Secondary | ICD-10-CM | POA: Diagnosis not present

## 2015-10-01 DIAGNOSIS — S161XXA Strain of muscle, fascia and tendon at neck level, initial encounter: Secondary | ICD-10-CM | POA: Insufficient documentation

## 2015-10-01 DIAGNOSIS — Z8739 Personal history of other diseases of the musculoskeletal system and connective tissue: Secondary | ICD-10-CM | POA: Insufficient documentation

## 2015-10-01 DIAGNOSIS — Z8669 Personal history of other diseases of the nervous system and sense organs: Secondary | ICD-10-CM | POA: Diagnosis not present

## 2015-10-01 DIAGNOSIS — F418 Other specified anxiety disorders: Secondary | ICD-10-CM | POA: Insufficient documentation

## 2015-10-01 DIAGNOSIS — Y92410 Unspecified street and highway as the place of occurrence of the external cause: Secondary | ICD-10-CM | POA: Insufficient documentation

## 2015-10-01 DIAGNOSIS — M542 Cervicalgia: Secondary | ICD-10-CM | POA: Diagnosis not present

## 2015-10-01 DIAGNOSIS — S3991XA Unspecified injury of abdomen, initial encounter: Secondary | ICD-10-CM | POA: Insufficient documentation

## 2015-10-01 DIAGNOSIS — Y998 Other external cause status: Secondary | ICD-10-CM | POA: Diagnosis not present

## 2015-10-01 DIAGNOSIS — F329 Major depressive disorder, single episode, unspecified: Secondary | ICD-10-CM | POA: Insufficient documentation

## 2015-10-01 MED ORDER — KETOROLAC TROMETHAMINE 30 MG/ML IJ SOLN
30.0000 mg | Freq: Once | INTRAMUSCULAR | Status: AC
  Administered 2015-10-01: 30 mg via INTRAMUSCULAR
  Filled 2015-10-01: qty 1

## 2015-10-01 MED ORDER — NAPROXEN 500 MG PO TABS: 500.0000 mg | ORAL_TABLET | Freq: Two times a day (BID) | ORAL | Status: DC

## 2015-10-01 MED ORDER — HYDROCODONE-ACETAMINOPHEN 5-325 MG PO TABS: 1.0000 | ORAL_TABLET | Freq: Four times a day (QID) | ORAL | Status: DC | PRN

## 2015-10-01 MED ORDER — HYDROCODONE-ACETAMINOPHEN 5-325 MG PO TABS
1.0000 | ORAL_TABLET | Freq: Once | ORAL | Status: AC
Start: 2015-10-01 — End: 2015-10-01
  Administered 2015-10-01: 1 via ORAL
  Filled 2015-10-01: qty 1
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
## 2015-10-05 ENCOUNTER — Ambulatory Visit: Payer: No Typology Code available for payment source

## 2015-10-20 ENCOUNTER — Other Ambulatory Visit: Payer: Self-pay

## 2015-10-20 ENCOUNTER — Ambulatory Visit: Payer: Self-pay

## 2015-10-20 DIAGNOSIS — Z1231 Encounter for screening mammogram for malignant neoplasm of breast: Secondary | ICD-10-CM

## 2015-10-22 ENCOUNTER — Ambulatory Visit (INDEPENDENT_AMBULATORY_CARE_PROVIDER_SITE_OTHER): Payer: Medicare Other | Admitting: Family Medicine

## 2015-10-22 ENCOUNTER — Encounter: Payer: Self-pay | Admitting: Family Medicine

## 2015-10-22 VITALS — BP 111/80 | HR 94 | Temp 98.2°F | Wt 173.7 lb

## 2015-10-22 DIAGNOSIS — M545 Low back pain, unspecified: Secondary | ICD-10-CM

## 2015-10-22 DIAGNOSIS — M542 Cervicalgia: Secondary | ICD-10-CM | POA: Diagnosis present

## 2015-10-22 MED ORDER — PAROXETINE HCL 10 MG PO TABS: 10.0000 mg | ORAL_TABLET | ORAL | Status: DC

## 2015-10-22 MED ORDER — CYCLOBENZAPRINE HCL 10 MG PO TABS: 10.0000 mg | ORAL_TABLET | Freq: Three times a day (TID) | ORAL | Status: DC | PRN

## 2015-10-22 MED ORDER — BUSPIRONE HCL 10 MG PO TABS
5.0000 mg | ORAL_TABLET | Freq: Two times a day (BID) | ORAL | Status: DC
Start: 2015-10-22 — End: 2019-05-16

## 2015-10-22 NOTE — Patient Instructions (Signed)
Focus on using tylenol (500-650mg  ever 6 hours), the meloxicam (15 daily), and flexeril as needed (every 8 hours) for your pain.  Do some of the exercises on the print out every single day.   If you want to go to the Physical therapists call the clinic and let us know.  You can increase the paxil to 20mg  if you would like to.    Call the Medication Assistance Program to help with your medication costs. Schedule an appointment to see them.
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
## 2015-11-10 ENCOUNTER — Ambulatory Visit
Admission: RE | Admit: 2015-11-10 | Discharge: 2015-11-10 | Disposition: A | Discharge status: Home / Self Care | Payer: No Typology Code available for payment source | Source: Ambulatory Visit

## 2015-11-10 DIAGNOSIS — Z1231 Encounter for screening mammogram for malignant neoplasm of breast: Secondary | ICD-10-CM

## 2015-11-11 ENCOUNTER — Other Ambulatory Visit: Payer: Self-pay | Admitting: Internal Medicine

## 2015-11-11 DIAGNOSIS — R928 Other abnormal and inconclusive findings on diagnostic imaging of breast: Secondary | ICD-10-CM

## 2015-11-16 ENCOUNTER — Other Ambulatory Visit: Payer: Self-pay | Admitting: Family Medicine

## 2015-11-16 ENCOUNTER — Other Ambulatory Visit: Payer: Self-pay

## 2015-11-16 DIAGNOSIS — R928 Other abnormal and inconclusive findings on diagnostic imaging of breast: Secondary | ICD-10-CM

## 2015-11-17 ENCOUNTER — Telehealth: Payer: Self-pay | Admitting: *Deleted

## 2015-11-17 NOTE — Telephone Encounter (Signed)
S/w Anderson [place] at the [facility] @ [phone] regarding pt.  A fax from the breast center's office was sent over to receive pt's mammogram report.  Pt has not been seen at this office since 2014.  Office stated to shred paperwork and will call pt. Stated pt was only supposed to f/u in our office PRN.

## 2015-11-18 ENCOUNTER — Other Ambulatory Visit: Payer: Self-pay | Admitting: Family Medicine

## 2015-11-18 ENCOUNTER — Ambulatory Visit
Admission: RE | Admit: 2015-11-18 | Discharge: 2015-11-18 | Disposition: A | Discharge status: Home / Self Care | Payer: No Typology Code available for payment source | Source: Ambulatory Visit | Attending: Internal Medicine | Admitting: Internal Medicine

## 2015-11-18 DIAGNOSIS — R928 Other abnormal and inconclusive findings on diagnostic imaging of breast: Secondary | ICD-10-CM

## 2015-11-18 DIAGNOSIS — N6310 Unspecified lump in the right breast, unspecified quadrant: Secondary | ICD-10-CM

## 2015-11-24 ENCOUNTER — Telehealth (HOSPITAL_COMMUNITY): Payer: Self-pay | Admitting: *Deleted

## 2015-11-24 NOTE — Telephone Encounter (Signed)
Telephoned patient at home # and left message to return call to [facility] 

## 2015-11-26 ENCOUNTER — Other Ambulatory Visit: Payer: Self-pay | Admitting: Obstetrics and Gynecology

## 2015-11-26 ENCOUNTER — Ambulatory Visit (HOSPITAL_COMMUNITY)
Admission: RE | Admit: 2015-11-26 | Discharge: 2015-11-26 | Disposition: A | Discharge status: Home / Self Care | Payer: Self-pay | Source: Ambulatory Visit | Attending: Obstetrics and Gynecology | Admitting: Obstetrics and Gynecology

## 2015-11-26 ENCOUNTER — Encounter (HOSPITAL_COMMUNITY): Payer: Self-pay

## 2015-11-26 ENCOUNTER — Other Ambulatory Visit (HOSPITAL_COMMUNITY): Payer: Self-pay | Admitting: *Deleted

## 2015-11-26 VITALS — BP 132/84 | Temp 98.4°F | Ht 66.0 in | Wt 179.0 lb

## 2015-11-26 DIAGNOSIS — N898 Other specified noninflammatory disorders of vagina: Secondary | ICD-10-CM

## 2015-11-26 DIAGNOSIS — Z01419 Encounter for gynecological examination (general) (routine) without abnormal findings: Secondary | ICD-10-CM

## 2015-11-26 DIAGNOSIS — N6310 Unspecified lump in the right breast, unspecified quadrant: Secondary | ICD-10-CM

## 2015-11-26 NOTE — Addendum Note (Signed)
Encounter addended by: Armond Hang, LPN on: [id number] PM<BR>     Documentation filed: Visit Diagnoses, Dx Association, Orders

## 2015-11-26 NOTE — Progress Notes (Signed)
Patient referred to [facility] by the [place] due to recommending a right breast biopsy. Screening mammogram completed 11/20/2015 recommending additional imaging of the right breast. Right breast diagnostic mammogram & ultrasound completed 11/18/2015.   Pap Smear: Pap smear completed today. Last Pap smear was 02/25/2011 at [facility] and normal. Per patient had an abnormal Pap smear was she was in her 80's or 30's that cryotherapy was completed for follow up. Per patient all Pap smears have been normal since. Last Pap smear result is in EPIC.  Physical exam: Breasts Breasts symmetrical. No skin abnormalities bilateral breasts. No nipple retraction bilateral breasts. No nipple discharge bilateral breasts. No lymphadenopathy. No lumps palpated left breast. Palpated a lump within the right breast at 9:30 o'clock 4 cm from the nipple. Complaints of tenderness when palpated lump. Referred patient to the [facility] for a right breast biopsy per recommendation. Appointment scheduled for Wednesday, December 02, 2015 at 1400.   Pelvic/Bimanual   Ext Genitalia No lesions, no swelling and no discharge observed on external genitalia.         Vagina Vagina pink and normal texture. No lesions and thick white vaginal discharge observed in vagina. Wet prep completed.         Cervix Cervix is present. Cervix pink and of normal texture. No discharge observed.     Uterus Uterus is present and palpable. Uterus in normal position and enlarged. Will refer to the [facility] for follow up.      Adnexae Bilateral ovaries present and unable to palpate. No tenderness on palpation.          Rectovaginal No rectal exam completed today since patient had no rectal complaints. No skin abnormalities observed on exam.    Smoking History: Patient is a current smoker. Smoking cessation discussed with patient. Referred patient to the [facility] Quitline and gave resources to  free classes offered at the [facility].  Patient Navigation: Patient education provided. Access to services provided for patient through [facility] program.   Colorectal Cancer Screening: Patient has never had a colonoscopy. Patient stated waiting on her disability and will have completed after she receive's it. No complaints today.

## 2015-11-26 NOTE — Patient Instructions (Addendum)
Educational materials on self breast awareness given. Explained to Vicki [name] that [facility] will cover Pap smears and HPV typing every 5 years unless has a history of abnormal Pap smears. Referred patient to the [facility] for a right breast biopsy per recommendation. Appointment scheduled for Wednesday, December 02, 2015 at 1400. Patient aware of appointment and will be there. Let patient know will follow up with her within the next couple weeks with results of Pap smear and wet prep by phone. Smoking cessation discussed with patient. Referred patient to the [facility] Quitline and gave resources to free classes offered at the [facility]. Vicki [name] verbalized understanding.  [place], Arvil Chaco, RN 8:29 AM

## 2015-11-27 ENCOUNTER — Encounter (HOSPITAL_COMMUNITY): Payer: Self-pay | Admitting: *Deleted

## 2015-11-27 LAB — CYTOLOGY - PAP

## 2015-12-02 ENCOUNTER — Other Ambulatory Visit: Payer: Self-pay | Admitting: Family Medicine

## 2015-12-02 ENCOUNTER — Ambulatory Visit
Admission: RE | Admit: 2015-12-02 | Discharge: 2015-12-02 | Disposition: A | Discharge status: Home / Self Care | Payer: No Typology Code available for payment source | Source: Ambulatory Visit | Attending: Family Medicine | Admitting: Family Medicine

## 2015-12-02 DIAGNOSIS — N6310 Unspecified lump in the right breast, unspecified quadrant: Secondary | ICD-10-CM

## 2015-12-03 ENCOUNTER — Telehealth (HOSPITAL_COMMUNITY): Payer: Self-pay | Admitting: *Deleted

## 2015-12-03 ENCOUNTER — Encounter (HOSPITAL_COMMUNITY): Payer: Self-pay | Admitting: *Deleted

## 2015-12-03 LAB — WET PREP, GENITAL
Clue Cell Exam: NEGATIVE
Trichomonas Exam: NEGATIVE
Yeast Exam: NEGATIVE

## 2015-12-03 LAB — TRICHOMONAS CULTURE

## 2015-12-03 NOTE — Telephone Encounter (Signed)
Telephoned patient at home # and discussed negative pap smear results. HPV was negative. Wet prep results were also negative. Next pap smear due in 5 years. Patient voiced understanding.

## 2016-01-08 ENCOUNTER — Ambulatory Visit (INDEPENDENT_AMBULATORY_CARE_PROVIDER_SITE_OTHER): Payer: Medicare Other | Admitting: Obstetrics & Gynecology

## 2016-01-08 ENCOUNTER — Encounter: Payer: Self-pay | Admitting: Obstetrics & Gynecology

## 2016-01-08 VITALS — BP 141/97 | HR 108 | Temp 98.3°F | Ht 63.5 in | Wt 178.8 lb

## 2016-01-08 DIAGNOSIS — N852 Hypertrophy of uterus: Secondary | ICD-10-CM

## 2016-01-08 DIAGNOSIS — I10 Essential (primary) hypertension: Secondary | ICD-10-CM

## 2016-01-08 DIAGNOSIS — Z01419 Encounter for gynecological examination (general) (routine) without abnormal findings: Secondary | ICD-10-CM

## 2016-01-08 NOTE — Patient Instructions (Signed)
Menopause Menopause is the normal time of life when menstrual periods stop completely. Menopause is complete when you have missed 12 consecutive menstrual periods. It usually occurs between the ages of 48 years and 55 years. Very rarely does a woman develop menopause before the age of 40 years. At menopause, your ovaries stop producing the female hormones estrogen and progesterone. This can cause undesirable symptoms and also affect your health. Sometimes the symptoms may occur 4-5 years before the menopause begins. There is no relationship between menopause and:  Oral contraceptives.  Number of children you had.  Race.  The age your menstrual periods started (menarche). Heavy smokers and very thin women may develop menopause earlier in life. CAUSES  The ovaries stop producing the female hormones estrogen and progesterone.  Other causes include:  Surgery to remove both ovaries.  The ovaries stop functioning for no known reason.  Tumors of the pituitary gland in the brain.  Medical disease that affects the ovaries and hormone production.  Radiation treatment to the abdomen or pelvis.  Chemotherapy that affects the ovaries. SYMPTOMS   Hot flashes.  Night sweats.  Decrease in sex drive.  Vaginal dryness and thinning of the vagina causing painful intercourse.  Dryness of the skin and developing wrinkles.  Headaches.  Tiredness.  Irritability.  Memory problems.  Weight gain.  Bladder infections.  Hair growth of the face and chest.  Infertility. More serious symptoms include:  Loss of bone (osteoporosis) causing breaks (fractures).  Depression.  Hardening and narrowing of the arteries (atherosclerosis) causing heart attacks and strokes. DIAGNOSIS   When the menstrual periods have stopped for 12 straight months.  Physical exam.  Hormone studies of the blood. TREATMENT  There are many treatment choices and nearly as many questions about them. The  decisions to treat or not to treat menopausal changes is an individual choice made with your health care provider. Your health care provider can discuss the treatments with you. Together, you can decide which treatment will work best for you. Your treatment choices may include:   Hormone therapy (estrogen and progesterone).  Non-hormonal medicines.  Treating the individual symptoms with medicine (for example antidepressants for depression).  Herbal medicines that may help specific symptoms.  Counseling by a psychiatrist or psychologist.  Group therapy.  Lifestyle changes including:  Eating healthy.  Regular exercise.  Limiting caffeine and alcohol.  Stress management and meditation.  No treatment. HOME CARE INSTRUCTIONS   Take the medicine your health care provider gives you as directed.  Get plenty of sleep and rest.  Exercise regularly.  Eat a diet that contains calcium (good for the bones) and soy products (acts like estrogen hormone).  Avoid alcoholic beverages.  Do not smoke.  If you have hot flashes, dress in layers.  Take supplements, calcium, and vitamin D to strengthen bones.  You can use over-the-counter lubricants or moisturizers for vaginal dryness.  Group therapy is sometimes very helpful.  Acupuncture may be helpful in some cases. SEEK MEDICAL CARE IF:   You are not sure you are in menopause.  You are having menopausal symptoms and need advice and treatment.  You are still having menstrual periods after age 55 years.  You have pain with intercourse.  Menopause is complete (no menstrual period for 12 months) and you develop vaginal bleeding.  You need a referral to a specialist (gynecologist, psychiatrist, or psychologist) for treatment. SEEK IMMEDIATE MEDICAL CARE IF:   You have severe depression.  You have excessive vaginal bleeding.    You fell and think you have a broken bone.  You have pain when you urinate.  You develop leg or  chest pain.  You have a fast pounding heart beat (palpitations).  You have severe headaches.  You develop vision problems.  You feel a lump in your breast.  You have abdominal pain or severe indigestion.   This information is not intended to replace advice given to you by your health care provider. Make sure you discuss any questions you have with your health care provider.   Document Released: 10/15/2003 Document Revised: 03/27/2013 Document Reviewed: 02/21/2013 Elsevier Interactive Patient Education 2016 Elsevier Inc.  

## 2016-01-08 NOTE — Progress Notes (Signed)
Vicki [name] blood pressure noted to be elevated today- she will follow up with Family Medicine for an appt.

## 2016-01-08 NOTE — Progress Notes (Signed)
Patient ID: Vicki [name], female   DOB: 12-11-1960, 55 y.o.   MRN: [id number]  Chief Complaint  Patient presents with  . Enlarged uterus  referred by [facility] clinic with impression of enlarged uterus on exam  HPI Vicki [name] is a 54 y.o. female.  [id number] Postmenopausal, no Gyn complaints, had Gyn exam 11/2015 and referred to evaluate uterine size. Pap was normal  HPI  Past Medical History  Diagnosis Date  . DEPRESSIVE DISORDER NOT ELSEWHERE CLASSIFIED   . SHOULDER PAIN, RIGHT   . Rotator cuff tear, right   . Sleep disorder   . Acquired hallux valgus of left foot   . Onychomycosis   . Foot deformity, acquired   . Callous ulcer ([place])   . Myalgia and myositis, unspecified   . Rotator cuff syndrome of left shoulder   . Depression with anxiety   . Oral thrush   . Dyspnea   . Shortness of breath   . Urinary hesitancy   . Grief reaction     Past Surgical History  Procedure Laterality Date  . Tubal ligation    . Cryotherphy  1986    Family History  Problem Relation Age of Onset  . Heart disease Mother   . Diabetes Mother   . Cancer Mother     breast and ovarian  . Stroke Mother   . Heart disease Father     Social History Social History  Substance Use Topics  . Smoking status: Current Every Day Smoker -- 0.75 packs/day    Types: Cigarettes  . Smokeless tobacco: Never Used     Comment: increased smoking again due to stress  . Alcohol Use: No    No Known Allergies  Current Outpatient Prescriptions  Medication Sig Dispense Refill  . albuterol (PROVENTIL HFA;VENTOLIN HFA) 108 (90 BASE) MCG/ACT inhaler Inhale 2 puffs into the lungs every 6 (six) hours as needed for wheezing or shortness of breath. 1 Inhaler 4  . aspirin 81 MG tablet Take 81 mg by mouth daily.    . busPIRone (BUSPAR) 10 MG tablet Take 0.5 tablets (5 mg total) by mouth 2 (two) times daily. 30 tablet 4  . cyclobenzaprine (FLEXERIL) 10 MG tablet Take 1 tablet (10 mg total) by mouth 3 (three) times  daily as needed for muscle spasms. 30 tablet 0  . meloxicam (MOBIC) 15 MG tablet Take 1 tablet (15 mg total) by mouth daily. 30 tablet 1  . PARoxetine (PAXIL) 10 MG tablet Take 1 tablet (10 mg total) by mouth every morning. 30 tablet 4   No current facility-administered medications for this visit.    Review of Systems Review of Systems  Constitutional: Negative.   Gastrointestinal: Negative.   Genitourinary: Negative for dysuria, vaginal bleeding, vaginal discharge, menstrual problem and pelvic pain.    Blood pressure 141/97, pulse 108, temperature 98.3 F (36.8 C), height 5' 3.5" (1.613 m), weight 178 lb 12.8 oz (81.103 kg).  Physical Exam Physical Exam  Constitutional: She appears well-developed. No distress.  Pulmonary/Chest: Effort normal.  Abdominal: She exhibits no distension and no mass. There is no tenderness.  Genitourinary: Vagina normal and uterus normal. No vaginal discharge found.  No tenderness or pelvic masses  Psychiatric: She has a normal mood and affect. Her behavior is normal.    Data Reviewed Pap result and note from [place]    Hypertensive   Normal pelvic exam no uterine mass  Plan    F/U with [facility] re BP Routine gyn  f/u, next pap 3-5 years        Vicki [name] 01/08/2016, 9:05 AM

## 2016-01-11 ENCOUNTER — Encounter: Payer: Self-pay | Admitting: Family Medicine

## 2016-01-11 ENCOUNTER — Ambulatory Visit (INDEPENDENT_AMBULATORY_CARE_PROVIDER_SITE_OTHER): Payer: Medicare Other | Admitting: Family Medicine

## 2016-01-11 VITALS — BP 140/89 | HR 92 | Temp 98.5°F | Wt 177.0 lb

## 2016-01-11 DIAGNOSIS — I10 Essential (primary) hypertension: Secondary | ICD-10-CM | POA: Diagnosis not present

## 2016-01-11 DIAGNOSIS — J449 Chronic obstructive pulmonary disease, unspecified: Secondary | ICD-10-CM

## 2016-01-11 DIAGNOSIS — R0602 Shortness of breath: Secondary | ICD-10-CM | POA: Diagnosis not present

## 2016-01-11 DIAGNOSIS — M7989 Other specified soft tissue disorders: Secondary | ICD-10-CM | POA: Diagnosis present

## 2016-01-11 DIAGNOSIS — R7303 Prediabetes: Secondary | ICD-10-CM | POA: Diagnosis not present

## 2016-01-11 LAB — POCT GLYCOSYLATED HEMOGLOBIN (HGB A1C): Hemoglobin A1C: 6.2

## 2016-01-11 LAB — BASIC METABOLIC PANEL WITH GFR
BUN: 11 mg/dL (ref 7–25)
CHLORIDE: 103 mmol/L (ref 98–110)
CO2: 30 mmol/L (ref 20–31)
CREATININE: 0.53 mg/dL (ref 0.50–1.05)
Calcium: 8.9 mg/dL (ref 8.6–10.4)
GFR, Est African American: >89 mL/min (ref >=60)
GFR, Est Non African American: >89 mL/min (ref >=60)
Glucose, Bld: 115 mg/dL — ABNORMAL HIGH (ref 65–99)
Potassium: 4.5 mmol/L (ref 3.5–5.3)
SODIUM: 140 mmol/L (ref 135–146)

## 2016-01-11 MED ORDER — HYDROCHLOROTHIAZIDE 12.5 MG PO TABS: 12.5000 mg | ORAL_TABLET | Freq: Every day | ORAL | Status: DC

## 2016-01-11 MED ORDER — MELOXICAM 15 MG PO TABS: 15.0000 mg | ORAL_TABLET | Freq: Every day | ORAL | Status: DC

## 2016-01-11 NOTE — Patient Instructions (Addendum)
Go to the medical supply stores and get compression stockings at least 20-87mm Hg.   Schedule an appointment for spirometry testing with the pharmacy clinic

## 2016-01-11 NOTE — Progress Notes (Signed)
   Subjective:    Patient ID: Vicki [name], female    DOB: 12/25/1960, 55 y.o.   MRN: [id number]  HPI  CC: leg swelling  # Leg swelling:  Noticed for past 6 months. Never happened prior. Have been consistently swollen every single day.  Standing or walking around makes it worse. Goes down a little bit when sleeping.   Has leg cramps throughout whole leg.   Feels some stinging sensation in her feet when they are more swollen.  Uses 2 pillows.   Not voiding less than normally  Has tried her sister's compression stockings ROS: no CP, +SOB  # Pre-diabetes  Has noticed peeing more frequently.  Doesn't drink much sugary drinks, does eat a lot of sweets/carbohydrates/breads/pastas ROS: no dysuria  # SOB  "All the time", not just necessarily with physical activity  Smoking about 13 cigarettes per day  Occasional cough.   No severe episodes.   Does take albuterol, this seems to help some. 2 puffs a day primarily at night.   Social Hx: current smoker, 0.75 ppd  Review of Systems   See HPI for ROS.   Past medical history, surgical, family, and social history reviewed and updated in the EMR as appropriate. Objective:  BP 140/89 mmHg  Pulse 92  Temp(Src) 98.5 F (36.9 C) (Oral)  Wt 177 lb (80.287 kg)  SpO2 95% Vitals and nursing note reviewed  General: no apparent distress  CV: normal rate, regular rhythm no murmurs, rubs or gallop  Resp: clear to auscultation bilaterally, occasional wheeze, normal rate/effort Extremities: 1-2+ edema bilaterally  Assessment & Plan:  No problem-specific assessment & plan notes found for this encounter.   Return in about 2 weeks (around 01/25/2016).

## 2016-01-19 DIAGNOSIS — M7989 Other specified soft tissue disorders: Secondary | ICD-10-CM | POA: Insufficient documentation

## 2016-01-19 NOTE — Assessment & Plan Note (Signed)
A1c 6.2. Discussed dietary modification, weight loss, exercise.

## 2016-01-19 NOTE — Assessment & Plan Note (Signed)
Suspect this is likely to do venous insufficiency. Recommended compression stockings and elevation.

## 2016-01-19 NOTE — Assessment & Plan Note (Signed)
Continues to smoke. Asked her to return back to pharmacy clinic for repeat PFTs.

## 2016-02-05 ENCOUNTER — Encounter: Payer: Self-pay | Admitting: Family Medicine

## 2016-02-05 ENCOUNTER — Ambulatory Visit (INDEPENDENT_AMBULATORY_CARE_PROVIDER_SITE_OTHER): Payer: Medicare Other | Admitting: Family Medicine

## 2016-02-05 VITALS — BP 124/85 | HR 106 | Temp 98.3°F | Wt 179.0 lb

## 2016-02-05 DIAGNOSIS — R600 Localized edema: Secondary | ICD-10-CM

## 2016-02-05 DIAGNOSIS — M542 Cervicalgia: Secondary | ICD-10-CM

## 2016-02-05 MED ORDER — CYCLOBENZAPRINE HCL 10 MG PO TABS: 10.0000 mg | ORAL_TABLET | Freq: Three times a day (TID) | ORAL | Status: DC | PRN

## 2016-02-05 MED ORDER — IBUPROFEN 800 MG PO TABS: 800.0000 mg | ORAL_TABLET | Freq: Three times a day (TID) | ORAL | Status: DC | PRN

## 2016-02-05 NOTE — Assessment & Plan Note (Signed)
Again recommended compression stocking and elevation, finances are tight and unable to afford stockings. Follow up as needed.
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
## 2016-02-05 NOTE — Patient Instructions (Signed)
Ask to schedule an appointment with the financial advisor here at the clinic --- you want to apply for [facility] card and Medication Assistance Program (MAP)  Discuss switching Paxil to Duloxetine with your next doctor.
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
## 2016-03-14 DIAGNOSIS — M545 Low back pain: Secondary | ICD-10-CM | POA: Diagnosis not present

## 2016-03-14 DIAGNOSIS — G8929 Other chronic pain: Secondary | ICD-10-CM | POA: Diagnosis not present

## 2016-03-14 DIAGNOSIS — K210 Gastro-esophageal reflux disease with esophagitis: Secondary | ICD-10-CM | POA: Diagnosis not present

## 2016-03-14 DIAGNOSIS — M25511 Pain in right shoulder: Secondary | ICD-10-CM | POA: Diagnosis not present

## 2016-04-25 DIAGNOSIS — Z716 Tobacco abuse counseling: Secondary | ICD-10-CM | POA: Diagnosis not present

## 2016-04-25 DIAGNOSIS — Z13220 Encounter for screening for lipoid disorders: Secondary | ICD-10-CM | POA: Diagnosis not present

## 2016-04-25 DIAGNOSIS — M79672 Pain in left foot: Secondary | ICD-10-CM | POA: Diagnosis not present

## 2016-04-25 DIAGNOSIS — Z1389 Encounter for screening for other disorder: Secondary | ICD-10-CM | POA: Diagnosis not present

## 2016-04-25 DIAGNOSIS — R0602 Shortness of breath: Secondary | ICD-10-CM | POA: Diagnosis not present

## 2016-04-25 DIAGNOSIS — E559 Vitamin D deficiency, unspecified: Secondary | ICD-10-CM | POA: Diagnosis not present

## 2016-04-25 DIAGNOSIS — R5383 Other fatigue: Secondary | ICD-10-CM | POA: Diagnosis not present

## 2016-04-25 DIAGNOSIS — Z23 Encounter for immunization: Secondary | ICD-10-CM | POA: Diagnosis not present

## 2016-04-25 DIAGNOSIS — E782 Mixed hyperlipidemia: Secondary | ICD-10-CM | POA: Diagnosis not present

## 2016-04-25 DIAGNOSIS — Z0000 Encounter for general adult medical examination without abnormal findings: Secondary | ICD-10-CM | POA: Diagnosis not present

## 2016-05-09 DIAGNOSIS — Z87891 Personal history of nicotine dependence: Secondary | ICD-10-CM | POA: Diagnosis not present

## 2016-05-09 DIAGNOSIS — Z122 Encounter for screening for malignant neoplasm of respiratory organs: Secondary | ICD-10-CM | POA: Diagnosis not present

## 2016-05-19 DIAGNOSIS — Z1211 Encounter for screening for malignant neoplasm of colon: Secondary | ICD-10-CM | POA: Diagnosis not present

## 2016-05-19 DIAGNOSIS — Z8371 Family history of colonic polyps: Secondary | ICD-10-CM | POA: Diagnosis not present

## 2016-05-25 DIAGNOSIS — E781 Pure hyperglyceridemia: Secondary | ICD-10-CM | POA: Diagnosis not present

## 2016-05-25 DIAGNOSIS — R7303 Prediabetes: Secondary | ICD-10-CM | POA: Diagnosis not present

## 2016-05-25 DIAGNOSIS — M129 Arthropathy, unspecified: Secondary | ICD-10-CM | POA: Diagnosis not present

## 2016-05-25 DIAGNOSIS — E559 Vitamin D deficiency, unspecified: Secondary | ICD-10-CM | POA: Diagnosis not present

## 2016-05-25 DIAGNOSIS — G8929 Other chronic pain: Secondary | ICD-10-CM | POA: Diagnosis not present

## 2016-05-25 DIAGNOSIS — M545 Low back pain: Secondary | ICD-10-CM | POA: Diagnosis not present

## 2016-05-25 DIAGNOSIS — Z79899 Other long term (current) drug therapy: Secondary | ICD-10-CM | POA: Diagnosis not present

## 2016-06-08 DIAGNOSIS — G8929 Other chronic pain: Secondary | ICD-10-CM | POA: Diagnosis not present

## 2016-06-08 DIAGNOSIS — E559 Vitamin D deficiency, unspecified: Secondary | ICD-10-CM | POA: Diagnosis not present

## 2016-06-08 DIAGNOSIS — R7303 Prediabetes: Secondary | ICD-10-CM | POA: Diagnosis not present

## 2016-06-08 DIAGNOSIS — Z79899 Other long term (current) drug therapy: Secondary | ICD-10-CM | POA: Diagnosis not present

## 2016-06-08 DIAGNOSIS — M545 Low back pain: Secondary | ICD-10-CM | POA: Diagnosis not present

## 2016-06-09 ENCOUNTER — Other Ambulatory Visit: Payer: Self-pay | Admitting: Family Medicine

## 2016-06-16 DIAGNOSIS — Z8371 Family history of colonic polyps: Secondary | ICD-10-CM | POA: Diagnosis not present

## 2016-06-16 DIAGNOSIS — Z01818 Encounter for other preprocedural examination: Secondary | ICD-10-CM | POA: Diagnosis not present

## 2016-06-16 DIAGNOSIS — Z1211 Encounter for screening for malignant neoplasm of colon: Secondary | ICD-10-CM | POA: Diagnosis not present

## 2016-06-16 DIAGNOSIS — K635 Polyp of colon: Secondary | ICD-10-CM | POA: Diagnosis not present

## 2016-06-16 LAB — HM COLONOSCOPY

## 2016-06-20 DIAGNOSIS — K635 Polyp of colon: Secondary | ICD-10-CM | POA: Diagnosis not present

## 2016-07-04 ENCOUNTER — Other Ambulatory Visit: Payer: Self-pay | Admitting: Family Medicine

## 2016-07-04 DIAGNOSIS — N63 Unspecified lump in unspecified breast: Secondary | ICD-10-CM

## 2016-07-06 DIAGNOSIS — R109 Unspecified abdominal pain: Secondary | ICD-10-CM | POA: Diagnosis not present

## 2016-07-06 DIAGNOSIS — K210 Gastro-esophageal reflux disease with esophagitis: Secondary | ICD-10-CM | POA: Diagnosis not present

## 2016-07-06 DIAGNOSIS — K635 Polyp of colon: Secondary | ICD-10-CM | POA: Diagnosis not present

## 2016-07-06 DIAGNOSIS — K5909 Other constipation: Secondary | ICD-10-CM | POA: Diagnosis not present

## 2016-07-07 DIAGNOSIS — R109 Unspecified abdominal pain: Secondary | ICD-10-CM | POA: Diagnosis not present

## 2016-07-08 ENCOUNTER — Other Ambulatory Visit: Payer: Self-pay

## 2016-07-08 ENCOUNTER — Other Ambulatory Visit: Payer: Self-pay | Admitting: Family Medicine

## 2016-07-08 DIAGNOSIS — N63 Unspecified lump in unspecified breast: Secondary | ICD-10-CM

## 2016-07-11 ENCOUNTER — Ambulatory Visit
Admission: RE | Admit: 2016-07-11 | Discharge: 2016-07-11 | Disposition: A | Discharge status: Home / Self Care | Payer: Medicare Other | Source: Ambulatory Visit | Attending: Family Medicine | Admitting: Family Medicine

## 2016-07-11 DIAGNOSIS — R928 Other abnormal and inconclusive findings on diagnostic imaging of breast: Secondary | ICD-10-CM | POA: Diagnosis not present

## 2016-07-11 DIAGNOSIS — N63 Unspecified lump in unspecified breast: Secondary | ICD-10-CM

## 2016-07-11 DIAGNOSIS — N6001 Solitary cyst of right breast: Secondary | ICD-10-CM | POA: Diagnosis not present

## 2016-10-07 NOTE — Progress Notes (Signed)
[facility]   Patient Details  Name: Vicki [name] MRN: [id number] Date of Birth: 1961-04-23 Age: 56 y.o. PCP: Smiley Houseman, MD  Vitals:   10/07/16 1251 10/07/16 1253  BP: (!) 162/110 140/90  Pulse: 94   Resp: 18   SpO2: 97%   Weight: 182 lb 6.4 oz (82.7 kg)   Height: 5' 4.75" (1.645 m)          [name] - 10/07/16 1200      Referral    Referring Provider self   Reason for referral Current Smoker;Family History;Inactivity;Obesitity/Overweight;Orthopedic;Other  pre-diabetes   Program Start Date 10/07/16     Measurement   Waist Circumference 41.5 inches   Hip Circumference 45 inches   Body fat 40.4 percent     Information for Trainer   Goals "walk longer w/o getting short of breath, lose weight"   Current Exercise "none other than cleaning house"   Orthopedic Concerns bilateral shoulder rotator cuff issues, RT worse than LT.  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 2   I have no trouble taking out the trash. 4   I do housework such as vacuuming and dusting on my own without difficulty. 4   I can easily lift a gallon of milk (8lbs). 2  due to shoulder pain.   I can easily walk a mile. 2   I have no trouble reaching into high cupboards or reaching down to pick up something from the floor. 1   I do not have trouble doing out-door work such as Armed forces logistics/support/administrative officer, raking leaves, or gardening. 1     Mobility and Daily Activities   I feel younger than my age. 1   I feel independent. 4   I feel energetic. 1   I live an active life.  1   I feel strong. 1   I feel healthy. 1   I feel active as other people my age. 3     How fit and strong are you.   Fit and Strong Total Score 28      Past Medical History:  Diagnosis Date  . Acquired hallux valgus of left foot   . Callous ulcer ([place])   . Depression with anxiety   . DEPRESSIVE DISORDER NOT ELSEWHERE CLASSIFIED   . Dyspnea   . Foot deformity, acquired   . Grief reaction   . Myalgia and myositis, unspecified   . Onychomycosis   . Oral thrush   . Rotator cuff syndrome of left shoulder   . Rotator cuff tear, right   . Shortness of breath   . SHOULDER PAIN, RIGHT   . Sleep disorder   . Urinary hesitancy    Past Surgical History:  Procedure Laterality Date  . cryotherphy  1986  . TUBAL LIGATION     History  Smoking Status  . Current Every Day Smoker  . Packs/day: 0.75  . Types: Cigarettes  Smokeless Tobacco  . Never Used    Comment: increased smoking again due to stress    Aljawhara has enrolled into the PREP along with her sister for accountability and convenience so they can go together.  She will receive nutrition education, coaching about smoking cessation, and training with exercise.   She and her sister will come to the weekly education class on Monday afternoons from 4:30-5:30 starting on 10/10/16.   [name] 10/07/2016, 12:59 PM

## 2016-10-14 NOTE — Progress Notes (Signed)
  [facility] PREP Weekly Session   Patient Details  Name: Vicki [name] MRN: [id number] Date of Birth: 09/20/1960 Age: 56 y.o. PCP: Smiley Houseman, MD  There were no vitals filed for this visit.      [name] Weekly seesion - 10/14/16 0900      Weekly Session   Topic Discussed Restaurant Eating   Classes attended to date 1   Comments First PREP class actually on 10/10/16        [name] 10/14/2016, 9:39 AM

## 2016-10-28 NOTE — Progress Notes (Signed)
[facility] PREP Weekly Session   Patient Details  Name: Vicki [name] MRN: [id number] Date of Birth: [birth year] Age: 56 y.o. PCP: Smiley Houseman, MD  Vitals:   10/28/16 7471  Weight: 173 lb 9.6 oz (78.7 kg)        [name] Weekly seesion - 10/28/16 0900      Weekly Session   Topic Discussed Importance of resistance training   Classes attended to date 2       [name] 10/28/2016, 9:22 AM

## 2016-11-04 NOTE — Progress Notes (Signed)
[facility] PREP Weekly Session   Patient Details  Name: Vicki [name] MRN: [id number] Date of Birth: 1960/09/27 Age: 57 y.o. PCP: Smiley Houseman, MD  There were no vitals filed for this visit.      [name] Weekly seesion - 11/04/16 1300      Weekly Session   Topic Discussed Expectations and non-scale victories   Minutes exercised this week 180 minutes  90cardio/90strength   Classes attended to date [place] 11/04/2016, 1:57 PM

## 2016-11-09 NOTE — Progress Notes (Signed)
[facility] PREP Weekly Session   Patient Details  Name: Vicki [name] MRN: [id number] Date of Birth: 04-08-1961 Age: 56 y.o. PCP: Smiley Houseman, MD  Vitals:   11/09/16 1311  Weight: 185 lb (83.9 kg)        [name] Weekly seesion - 11/09/16 1300      Weekly Session   Topic Discussed Other  Portion control   Minutes exercised this week 56 minutes  6cardio/50strength   Classes attended to date 92     Vicki [name] has been coming to classes and is receptive to the information she is learning.  She seems to be motivated to make healthy lifestyle changes.   Vicki [name] 11/09/2016, 1:11 PM

## 2016-11-16 NOTE — Progress Notes (Signed)
[facility] PREP Weekly Session   Patient Details  Name: Vicki [name] MRN: [id number] Date of Birth: 1961-07-14 Age: 56 y.o. PCP: Smiley Houseman, MD  Vitals:   11/16/16 6606  Weight: 183 lb 6.4 oz (83.2 kg)        [name] Weekly seesion - 11/16/16 0900      Weekly Session   Topic Discussed Finding support   Minutes exercised this week 60 minutes  30cardio/30strength   Classes attended to date [place] 11/16/2016, 9:26 AM

## 2016-11-30 NOTE — Progress Notes (Signed)
[facility] PREP Weekly Session   Patient Details  Name: Vicki [name] MRN: [id number] Date of Birth: Sep 27, 1960 Age: 56 y.o. PCP: Smiley Houseman, MD  Vitals:   11/30/16 0857  Weight: 184 lb 6.4 oz (83.6 kg)        [name] Weekly seesion - 11/30/16 0800      Weekly Session   Topic Discussed Hitting roadblocks   Minutes exercised this week 90 minutes  45cardio/45strength   Classes attended to date 6       [name] 11/30/2016, 8:58 AM

## 2016-12-07 NOTE — Progress Notes (Signed)
[facility] PREP Weekly Session   Patient Details  Name: Vicki [name] MRN: [id number] Date of Birth: 03-18-1961 Age: 56 y.o. PCP: Smiley Houseman, MD  Vitals:   12/07/16 1106  Weight: 180 lb 6.4 oz (81.8 kg)        [name] Weekly seesion - 12/07/16 1100      Weekly Session   Topic Discussed Other  fat calories   Minutes exercised this week 120 minutes  60 cardio/60strength   Classes attended to date [place] 12/07/2016, 11:07 AM

## 2016-12-16 ENCOUNTER — Other Ambulatory Visit (HOSPITAL_COMMUNITY): Payer: Self-pay | Admitting: *Deleted

## 2016-12-16 DIAGNOSIS — N6001 Solitary cyst of right breast: Secondary | ICD-10-CM

## 2016-12-16 NOTE — Progress Notes (Signed)
[facility] PREP Weekly Session   Patient Details  Name: Vicki [name] MRN: [id number] Date of Birth: 1961-01-18 Age: 56 y.o. PCP: Smiley Houseman, MD  Vitals:   12/16/16 0946  Weight: 183 lb 3.2 oz (83.1 kg)        [name] Weekly seesion - 12/16/16 0900      Weekly Session   Topic Discussed Healthy eating tips   Minutes exercised this week 280 minutes  200cardio/80strength   Classes attended to date [place] 12/16/2016, 9:47 AM

## 2016-12-21 NOTE — Progress Notes (Signed)
[facility] PREP Weekly Session   Patient Details  Name: Vicki [name] MRN: [id number] Date of Birth: [birth year] Age: 56 y.o. PCP: Smiley Houseman, MD  Vitals:   12/21/16 0955  Weight: 182 lb 6.4 oz (82.7 kg)        [name] Weekly seesion - 12/21/16 0900      Weekly Session   Topic Discussed Other ways to be active   Minutes exercised this week 120 minutes  60cardio/60strength   Classes attended to date [place] 12/21/2016, 10:06 AM

## 2016-12-31 NOTE — Progress Notes (Signed)
[facility] PREP Weekly Session   Patient Details  Name: Vicki [name] MRN: [id number] Date of Birth: 07-10-1961 Age: 56 y.o. PCP: Smiley Houseman, MD  Vitals:   12/26/16 1630  Weight: 182 lb 6.4 oz (82.7 kg)        [name] Weekly seesion - 12/31/16 2200      Weekly Session   Topic Discussed Health habits   Minutes exercised this week 120 minutes  60cardio/60strength   Classes attended to date [place] 12/31/2016, 10:52 PM

## 2017-01-05 ENCOUNTER — Ambulatory Visit
Admission: RE | Admit: 2017-01-05 | Discharge: 2017-01-05 | Disposition: A | Discharge status: Home / Self Care | Payer: Medicare Other | Source: Ambulatory Visit | Attending: Obstetrics and Gynecology | Admitting: Obstetrics and Gynecology

## 2017-01-05 ENCOUNTER — Ambulatory Visit (HOSPITAL_COMMUNITY)
Admission: RE | Admit: 2017-01-05 | Discharge: 2017-01-05 | Disposition: A | Discharge status: Home / Self Care | Payer: Self-pay | Source: Ambulatory Visit | Attending: Obstetrics and Gynecology | Admitting: Obstetrics and Gynecology

## 2017-01-05 DIAGNOSIS — N6001 Solitary cyst of right breast: Secondary | ICD-10-CM

## 2017-01-13 NOTE — Progress Notes (Signed)
[facility] PREP Weekly Session   Patient Details  Name: Vicki [name] MRN: [id number] Date of Birth: 1961/02/04 Age: 56 y.o. PCP: Smiley Houseman, MD  Vitals:   01/09/17 1158  Weight: 181 lb 6.4 oz (82.3 kg)        [name] Weekly seesion - 01/13/17 1100      Weekly Session   Topic Discussed Stress management and problem solving   Minutes exercised this week 120 minutes  60cardio/60strength   Classes attended to date 11       [name] 01/13/2017, 11:59 AM

## 2017-01-17 ENCOUNTER — Other Ambulatory Visit: Payer: Self-pay | Admitting: Internal Medicine

## 2017-01-17 DIAGNOSIS — N6001 Solitary cyst of right breast: Secondary | ICD-10-CM

## 2017-01-18 NOTE — Progress Notes (Signed)
[facility] PREP Weekly Session   Patient Details  Name: LOMETA RIGGIN MRN: [id number] Date of Birth: 09-20-1960 Age: 56 y.o. PCP: Smiley Houseman, MD  Vitals:   01/16/17 0901  Weight: 180 lb 6.4 oz (81.8 kg)        [name] Weekly seesion - 01/18/17 0900      Weekly Session   Topic Discussed Other  Guest speaker   Classes attended to date 14       [name] 01/18/2017, 9:03 AM

## 2017-01-19 ENCOUNTER — Emergency Department (HOSPITAL_COMMUNITY)
Admission: EM | Admit: 2017-01-19 | Discharge: 2017-01-19 | Disposition: A | Discharge status: Home / Self Care | Payer: Medicare HMO | Attending: Emergency Medicine | Admitting: Emergency Medicine

## 2017-01-19 ENCOUNTER — Encounter (HOSPITAL_COMMUNITY): Payer: Self-pay

## 2017-01-19 DIAGNOSIS — M545 Low back pain: Secondary | ICD-10-CM | POA: Diagnosis present

## 2017-01-19 DIAGNOSIS — F17210 Nicotine dependence, cigarettes, uncomplicated: Secondary | ICD-10-CM | POA: Diagnosis not present

## 2017-01-19 DIAGNOSIS — Z79899 Other long term (current) drug therapy: Secondary | ICD-10-CM | POA: Insufficient documentation

## 2017-01-19 DIAGNOSIS — R7303 Prediabetes: Secondary | ICD-10-CM | POA: Diagnosis not present

## 2017-01-19 DIAGNOSIS — M5432 Sciatica, left side: Secondary | ICD-10-CM | POA: Diagnosis not present

## 2017-01-19 MED ORDER — ACETAMINOPHEN 325 MG PO TABS
650.0000 mg | ORAL_TABLET | Freq: Once | ORAL | Status: AC
  Administered 2017-01-19: 650 mg via ORAL
  Filled 2017-01-19: qty 2

## 2017-01-19 NOTE — ED Provider Notes (Signed)
[facility] DEPT Provider Note   CSN: [id number] Arrival date & time: 01/19/17  1108  By signing my name below, I, [name], attest that this documentation has been prepared under the direction and in the presence of Vicki Pean, PA-C. Electronically Signed: [name], ED Scribe. 01/19/17. 12:03 PM.  History   Chief Complaint Chief Complaint  Patient presents with  . back/hip pain   The history is provided by the patient. No language interpreter was used.   HPI Comments: Vicki [name] is a 56 y.o. female who presents to the Emergency Department complaining of "cramping" left buttock/hip pain with movement that radiates down the leg and lower back that began on 01/16/2017. She reports that moving and sitting exacerbates her pain. She indicates that she has done more bending recently.  Took moloxicam and tylenol and tried wearing a back brace for pain with inadequate relief yesterday. No treatments today. The pt denies injury and fall. She does not have a h/x of IV drugs or cancer. No falls. No fevers.  The pt denies difficulty urinating, hematuria, frequency, bowel/bladder incontinence, abdominal pain, nausea, vomiting, weight changes, and diarrhea.   Past Medical History:  Diagnosis Date  . Acquired hallux valgus of left foot   . Callous ulcer ([place])   . Depression with anxiety   . DEPRESSIVE DISORDER NOT ELSEWHERE CLASSIFIED   . Dyspnea   . Foot deformity, acquired   . Grief reaction   . Myalgia and myositis, unspecified   . Onychomycosis   . Oral thrush   . Rotator cuff syndrome of left shoulder   . Rotator cuff tear, right   . Shortness of breath   . SHOULDER PAIN, RIGHT   . Sleep disorder   . Urinary hesitancy     Patient Active Problem List   Diagnosis Date Noted  . Neck pain 02/05/2016  . Leg swelling 01/19/2016  . Dry cough 02/11/2015  . Fatigue 02/11/2015  . Prediabetes 10/29/2014  . Polyuria 10/28/2014  . Hyperglycemia 10/28/2014  . Left  breast mass 04/11/2014  . Right hip pain 03/21/2014  . Edema, lower extremity 03/21/2014  . Low back pain 12/17/2013  . Right knee pain 12/17/2013  . Chest pain, central 11/05/2013  . Lateral epicondylitis of left elbow 11/05/2013  . Obstructive chronic bronchitis without exacerbation ([place]) 08/23/2013  . Wrist fracture, right 07/08/2013  . Sciatica 05/28/2013  . Grief reaction 04/02/2013  . Myalgia and myositis, unspecified 03/07/2012  . Rotator cuff syndrome of left shoulder 03/07/2012  . Depression with anxiety 03/07/2012  . Foot deformity, acquired 06/21/2011  . Callous ulcer ([place]) 06/21/2011  . Acquired hallux valgus of left foot 05/10/2011  . Sleep disorder 02/04/2011  . Rotator cuff tear, right 09/03/2010  . SHOULDER PAIN, RIGHT 08/20/2010  . TOBACCO ABUSE 06/11/2010  . DEPRESSIVE DISORDER NOT ELSEWHERE CLASSIFIED 06/11/2010    Past Surgical History:  Procedure Laterality Date  . cryotherphy  1986  . TUBAL LIGATION      OB History    Gravida Para Term Preterm AB Living   4 3 3   1 3    SAB TAB Ectopic Multiple Live Births     1             Home Medications    Prior to Admission medications   Medication Sig Start Date End Date Taking? Authorizing Provider  albuterol (PROVENTIL HFA;VENTOLIN HFA) 108 (90 BASE) MCG/ACT inhaler Inhale 2 puffs into the lungs every 6 (six) hours as needed for  wheezing or shortness of breath. 10/28/14   Willeen Niece, MD  aspirin 81 MG tablet Take 81 mg by mouth daily.    [provider]  busPIRone (BUSPAR) 10 MG tablet Take 0.5 tablets (5 mg total) by mouth 2 (two) times daily. 10/22/15   Leone Brand, MD  cyclobenzaprine (FLEXERIL) 10 MG tablet Take 1 tablet (10 mg total) by mouth 3 (three) times daily as needed for muscle spasms. 02/05/16   Leone Brand, MD  hydrochlorothiazide (HYDRODIURIL) 12.5 MG tablet Take 1 tablet (12.5 mg total) by mouth daily. 01/11/16   Leone Brand, MD  ibuprofen (ADVIL,MOTRIN) 800 MG tablet  Take 1 tablet (800 mg total) by mouth every 8 (eight) hours as needed. 02/05/16   Leone Brand, MD  PARoxetine (PAXIL) 10 MG tablet Take 1 tablet (10 mg total) by mouth every morning. 10/22/15   Leone Brand, MD    Family History Family History  Problem Relation Age of Onset  . Heart disease Mother   . Diabetes Mother   . Cancer Mother        breast and ovarian  . Stroke Mother   . Heart disease Father     Social History Social History  Substance Use Topics  . Smoking status: Current Every Day Smoker    Packs/day: 0.75    Types: Cigarettes  . Smokeless tobacco: Never Used     Comment: increased smoking again due to stress  . Alcohol use No     Allergies   Patient has no known allergies.   Review of Systems Review of Systems  Constitutional: Negative for unexpected weight change.  Gastrointestinal: Negative for abdominal pain, diarrhea, nausea and vomiting.  Genitourinary: Negative for decreased urine volume, difficulty urinating, dysuria, flank pain, frequency, hematuria and urgency.  Musculoskeletal: Positive for back pain. Negative for gait problem.  Skin: Negative for rash and wound.  Neurological: Negative for weakness and numbness.     Physical Exam Updated Vital Signs BP (!) 144/94 (BP Location: Left Arm)   Pulse 97   Temp 98.1 F (36.7 C)   Resp 16   SpO2 94%   Physical Exam  Constitutional: She appears well-developed and well-nourished. No distress.  Nontoxic-appearing.  HENT:  Head: Normocephalic and atraumatic.  Eyes: Right eye exhibits no discharge. Left eye exhibits no discharge.  Cardiovascular: Intact distal pulses.   Pulmonary/Chest: Effort normal. No respiratory distress.  Musculoskeletal: Normal range of motion. She exhibits no edema, tenderness or deformity.  No midline neck or back tenderness. No back erythema, deformity or ecchymosis. Good strength in her bilateral lower extremities.  Neurological: She is alert. She displays normal  reflexes. No sensory deficit. Coordination normal.  Bilateral patellar DTRs are intact.  Normal gait. Sensation is intact her bilateral lower extremities.  Skin: Skin is warm and dry. Capillary refill takes less than 2 seconds. No rash noted. She is not diaphoretic. No erythema. No pallor.  Psychiatric: She has a normal mood and affect. Her behavior is normal.  Nursing note and vitals reviewed.    ED Treatments / Results   DIAGNOSTIC STUDIES: Oxygen Saturation is 94% on RA, low by my interpretation.   COORDINATION OF CARE: 11:56 AM-Discussed next steps with pt. Pt verbalized understanding and is agreeable with the plan.   Labs (all labs ordered are listed, but only abnormal results are displayed) Labs Reviewed - No data to display  EKG  EKG Interpretation None       Radiology No  results found.  Procedures Procedures (including critical care time)  Medications Ordered in ED Medications  acetaminophen (TYLENOL) tablet 650 mg (650 mg Oral Given 01/19/17 1206)     Initial Impression / Assessment and Plan / ED Course  I have reviewed the triage vital signs and the nursing notes.  Pertinent labs & imaging results that were available during my care of the patient were reviewed by me and considered in my medical decision making (see chart for details).    This is a 56 y.o. female who presents to the Emergency Department complaining of "cramping" left buttock/hip pain with movement that radiates down the leg and lower back that began on 01/16/2017. She reports that moving and sitting exacerbates her pain. She indicates that she has done more bending recently.  Took moloxicam and tylenol and tried wearing a back brace for pain with inadequate relief yesterday. No treatments today.  Patient with left sided sciatica and back pain.   No neurological deficits and normal neuro exam. Normal gait. No fevers. She is afebrile and non-toxic appearing. No loss of bowel or bladder control.   No concern for cauda equina.  No fever, night sweats, weight loss, h/o cancer, IVDU.  RICE protocol and pain medicine indicated and discussed with patient.  I advised the patient to follow-up with their primary care provider this week. I advised the patient to return to the emergency department with new or worsening symptoms or new concerns. The patient verbalized understanding and agreement with plan.    Final Clinical Impressions(s) / ED Diagnoses   Final diagnoses:  Sciatica of left side    New Prescriptions New Prescriptions   No medications on file   I personally performed the services described in this documentation, which was scribed in my presence. The recorded information has been reviewed and is accurate.       Vicki Pean, PA-C 01/19/17 1208    Tanna Furry, MD 01/23/17 [id number]

## 2017-01-19 NOTE — ED Triage Notes (Signed)
Patient complains of 1 week of left buttock/hip pain with any movement. Denies trauma. States that the pain is radiating down leg

## 2017-01-19 NOTE — Discharge Instructions (Signed)
Please use your meloxicam at home as prescribed.

## 2017-01-19 NOTE — ED Notes (Signed)
Pt c/o left buttock pain x 3 days. Now pain is going across lower back.

## 2017-01-24 ENCOUNTER — Ambulatory Visit
Admission: RE | Admit: 2017-01-24 | Discharge: 2017-01-24 | Disposition: A | Discharge status: Home / Self Care | Payer: Medicare HMO | Source: Ambulatory Visit | Attending: Internal Medicine | Admitting: Internal Medicine

## 2017-01-24 ENCOUNTER — Other Ambulatory Visit: Payer: Self-pay | Admitting: Internal Medicine

## 2017-01-24 DIAGNOSIS — N6001 Solitary cyst of right breast: Secondary | ICD-10-CM

## 2017-01-24 DIAGNOSIS — R2232 Localized swelling, mass and lump, left upper limb: Secondary | ICD-10-CM

## 2017-04-14 ENCOUNTER — Ambulatory Visit: Payer: Self-pay | Admitting: Surgery

## 2017-04-14 NOTE — H&P (Signed)
History of Present Illness Vicki [name]. Marieanne Marxen MD; 04/14/2017 10:58 AM) The patient is a 56 year old female who presents with a complaint of Mass. Referred by Dr. Sandi Mariscal for evaluation of left axillary Mass. This is a 56 year old female who presents with a 10 year history of a slowly enlarging mass just anterior to her left axilla. This has begun to protrude skin. It does cause some discomfort. Occasionally there is a foul odor coming from this area. This area has never become infected. Mammogram was unremarkable. Ultrasound showed a 3.2 x 2.9 x 2.3 cm sebaceous cyst. She is now referred for excision.  The patient has had issues with chronic opioid use in the past for chronic pain in her shoulder. She is now currently just taking Mobic.  The patient has 3 enlarging skin lesions on the left side of her cheek that she would also like to have removed for biopsy.  Final [99] 01/24/2017 3:54 PM 01/24/2017 4:18 PM Study Result  CLINICAL DATA: History of right breast cyst aspiration. Today's exam returns patient to annual follow-up. Patient reports a mass under left arm 3 years, which is tender at times. She denies associated drainage or erythema. EXAM: 2D DIGITAL DIAGNOSTIC BILATERAL MAMMOGRAM WITH CAD AND ADJUNCT TOMO ULTRASOUND LEFT BREAST COMPARISON: Previous exam(s). ACR Breast Density Category c: The breast tissue is heterogeneously dense, which may obscure small masses. FINDINGS: Within the left axilla, there is a round mass, increased in size compared prior studies. No suspicious findings are identified with within either breast. Mammographic images were processed with CAD. On physical exam, I palpate a rounded mobile mass in the left axilla. There is no visible punctum. Targeted ultrasound is performed, showing A circumscribed mixed echogenicity mass in the subdermal layer in the left axilla corresponding to the palpable abnormality. This measures 3.2 x 2.3 x 2.9 cm. No definite  internal blood flow identified on Doppler evaluation. IMPRESSION: 1. Enlarging left axillary mass. Suspect variant of intradermal inclusion cyst. However, given the change in size, further evaluation is warranted. 2. No suspicious mass in the breasts. RECOMMENDATION: Dermatology referral is recommended to assess the need for biopsy/excision of subdermal left axillary mass. Otherwise, screening mammogram will be recommended in 1 year. I have discussed the findings and recommendations with the patient. Results were also provided in writing at the conclusion of the visit. If applicable, a reminder letter will be sent to the patient regarding the next appointment. BI-RADS CATEGORY 1: Negative. Electronically Signed By: Nolon Nations M.D. On: 01/24/2017 16:42     Past Surgical History Malachy Moan, RMA; 04/14/2017 10:18 AM) No pertinent past surgical history  Diagnostic Studies History Malachy Moan, RMA; 04/14/2017 10:18 AM) Mammogram within last year  Medication History Malachy Moan, RMA; 04/14/2017 10:20 AM) BusPIRone HCl (10MG  Tablet, Oral) Active. Meloxicam (7.5MG  Tablet, Oral) Active. Omeprazole (40MG  Capsule DR, Oral) Active. Cyclobenzaprine HCl (10MG  Tablet, Oral) Active. Aspirin (81MG  Tablet, Oral) Active. PARoxetine HCl (10MG  Tablet, Oral) Active. Ibuprofen (800MG  Tablet, Oral) Active. Medications Reconciled  Social History Malachy Moan, [place]; 04/14/2017 10:18 AM) Alcohol use Remotely quit alcohol use. Caffeine use Carbonated beverages, Coffee, Tea. No drug use Tobacco use Current every day smoker.  Family History Malachy Moan, [place]; 04/14/2017 10:18 AM) Alcohol Abuse Brother, Father, Mother, Sister, Son. Breast Cancer Mother. Cancer Mother. Diabetes Mellitus Mother, Sister. Heart Disease Father, Sister. Heart disease in female family member before age 51 Hypertension Sister.  Pregnancy / Birth History Malachy Moan,  [place]; 04/14/2017 10:18 AM) Age of menopause 48-50  Gravida 3 Maternal age 58-20 Para 3 Regular periods  Other Problems Malachy Moan, [place]; 04/14/2017 10:18 AM) Back Pain     Review of Systems Malachy Moan RMA; 04/14/2017 10:18 AM) General Present- Night Sweats. Not Present- Appetite Loss, Chills, Fatigue, Fever, Weight Gain and Weight Loss. Musculoskeletal Present- Joint Pain. Not Present- Back Pain, Joint Stiffness, Muscle Pain, Muscle Weakness and Swelling of Extremities. Neurological Present- Numbness. Not Present- Decreased Memory, Fainting, Headaches, Seizures, Tingling, Tremor, Trouble walking and Weakness. Psychiatric Present- Change in Sleep Pattern. Not Present- Anxiety, Bipolar, Depression, Fearful and Frequent crying. Endocrine Present- New Diabetes. Not Present- Cold Intolerance, Excessive Hunger, Hair Changes, Heat Intolerance and Hot flashes.  Vitals Malachy Moan RMA; 04/14/2017 10:21 AM) 04/14/2017 10:20 AM Weight: 179 lb Height: 64.25in Body Surface Area: 1.87 m Body Mass Index: 30.49 kg/m  Temp.: 97.34F  Pulse: 88 (Regular)  BP: 120/82 (Sitting, Left Arm, Standard)      Physical Exam Rodman Key K. Senovia Gauer MD; 04/14/2017 10:58 AM)  The physical exam findings are as follows: Note:WDWN in NAD Eyes: Pupils equal, round; sclera anicteric HENT: Oral mucosa moist; good dentition Left cheek - 1 cm polypoid lesions just above corner of mouth, two other 5 mm skin lesions on lateral cheek Neck: No masses palpated, no thyromegaly Lungs: CTA bilaterally; normal respiratory effort Chest - just anterior to left axilla - 3 cm protruding subcutaneous mass; mobile, smooth CV: Regular rate and rhythm; no murmurs; extremities well-perfused with no edema Abd: +bowel sounds, soft, non-tender, no palpable organomegaly; no palpable hernias Skin: Warm, dry; no sign of jaundice Psychiatric - alert and oriented x 4; calm mood and affect    Assessment & Plan  Rodman Key K. Rivaldo Hineman MD; 04/14/2017 10:36 AM)  SEBACEOUS CYST OF LEFT AXILLA (L72.3) Impression: 3 cm subcutaneous  SKIN LESION OF CHEEK (L98.9) Impression: Left cheek x 3  Current Plans Schedule for Surgery - excision of sebaceous cyst left axilla and skin lesions left cheek (3 lesions). The surgical procedure has been discussed with the patient. Potential risks, benefits, alternative treatments, and expected outcomes have been explained. All of the patient's questions at this time have been answered. The likelihood of reaching the patient's treatment goal is good. The patient understand the proposed surgical procedure and wishes to proceed.  Vicki [name]. Georgette Dover, MD, [facility]  General/ Trauma Surgery  04/14/2017 10:59 AM

## 2017-05-30 ENCOUNTER — Other Ambulatory Visit: Payer: Self-pay | Admitting: Surgery

## 2018-01-09 ENCOUNTER — Other Ambulatory Visit: Payer: Self-pay | Admitting: Internal Medicine

## 2018-01-09 DIAGNOSIS — Z1231 Encounter for screening mammogram for malignant neoplasm of breast: Secondary | ICD-10-CM

## 2018-01-29 ENCOUNTER — Ambulatory Visit
Admission: RE | Admit: 2018-01-29 | Discharge: 2018-01-29 | Disposition: A | Discharge status: Home / Self Care | Payer: Medicare HMO | Source: Ambulatory Visit | Attending: Internal Medicine | Admitting: Internal Medicine

## 2018-01-29 DIAGNOSIS — Z1231 Encounter for screening mammogram for malignant neoplasm of breast: Secondary | ICD-10-CM

## 2018-01-31 ENCOUNTER — Other Ambulatory Visit: Payer: Self-pay | Admitting: Internal Medicine

## 2018-01-31 DIAGNOSIS — R928 Other abnormal and inconclusive findings on diagnostic imaging of breast: Secondary | ICD-10-CM

## 2018-02-05 ENCOUNTER — Other Ambulatory Visit: Payer: Self-pay | Admitting: Internal Medicine

## 2018-02-05 ENCOUNTER — Ambulatory Visit
Admission: RE | Admit: 2018-02-05 | Discharge: 2018-02-05 | Disposition: A | Discharge status: Home / Self Care | Payer: Medicare HMO | Source: Ambulatory Visit | Attending: Internal Medicine | Admitting: Internal Medicine

## 2018-02-05 DIAGNOSIS — R928 Other abnormal and inconclusive findings on diagnostic imaging of breast: Secondary | ICD-10-CM

## 2018-02-05 DIAGNOSIS — N6002 Solitary cyst of left breast: Secondary | ICD-10-CM

## 2018-07-19 LAB — HM PAP SMEAR: HM Pap smear: NEGATIVE

## 2018-08-10 ENCOUNTER — Ambulatory Visit
Admission: RE | Admit: 2018-08-10 | Discharge: 2018-08-10 | Disposition: A | Discharge status: Home / Self Care | Payer: Medicare HMO | Source: Ambulatory Visit | Attending: Internal Medicine | Admitting: Internal Medicine

## 2018-08-10 DIAGNOSIS — N6002 Solitary cyst of left breast: Secondary | ICD-10-CM

## 2018-09-25 ENCOUNTER — Inpatient Hospital Stay (HOSPITAL_COMMUNITY)
Admission: EM | Admit: 2018-09-25 | Discharge: 2018-09-28 | DRG: 193 | Disposition: A | Discharge status: Home / Self Care | Payer: Medicare HMO | Attending: Internal Medicine | Admitting: Internal Medicine

## 2018-09-25 ENCOUNTER — Encounter (HOSPITAL_COMMUNITY): Payer: Self-pay | Admitting: Pharmacy Technician

## 2018-09-25 ENCOUNTER — Emergency Department (HOSPITAL_COMMUNITY): Payer: Medicare HMO

## 2018-09-25 ENCOUNTER — Other Ambulatory Visit: Payer: Self-pay

## 2018-09-25 DIAGNOSIS — Z803 Family history of malignant neoplasm of breast: Secondary | ICD-10-CM

## 2018-09-25 DIAGNOSIS — Z823 Family history of stroke: Secondary | ICD-10-CM

## 2018-09-25 DIAGNOSIS — J101 Influenza due to other identified influenza virus with other respiratory manifestations: Secondary | ICD-10-CM | POA: Diagnosis not present

## 2018-09-25 DIAGNOSIS — F17210 Nicotine dependence, cigarettes, uncomplicated: Secondary | ICD-10-CM | POA: Diagnosis present

## 2018-09-25 DIAGNOSIS — I10 Essential (primary) hypertension: Secondary | ICD-10-CM | POA: Diagnosis present

## 2018-09-25 DIAGNOSIS — F419 Anxiety disorder, unspecified: Secondary | ICD-10-CM | POA: Diagnosis present

## 2018-09-25 DIAGNOSIS — Z79899 Other long term (current) drug therapy: Secondary | ICD-10-CM

## 2018-09-25 DIAGNOSIS — Z8249 Family history of ischemic heart disease and other diseases of the circulatory system: Secondary | ICD-10-CM

## 2018-09-25 DIAGNOSIS — J019 Acute sinusitis, unspecified: Secondary | ICD-10-CM

## 2018-09-25 DIAGNOSIS — J9601 Acute respiratory failure with hypoxia: Secondary | ICD-10-CM | POA: Diagnosis present

## 2018-09-25 DIAGNOSIS — Z7982 Long term (current) use of aspirin: Secondary | ICD-10-CM

## 2018-09-25 DIAGNOSIS — B9689 Other specified bacterial agents as the cause of diseases classified elsewhere: Secondary | ICD-10-CM

## 2018-09-25 DIAGNOSIS — J441 Chronic obstructive pulmonary disease with (acute) exacerbation: Secondary | ICD-10-CM | POA: Diagnosis present

## 2018-09-25 DIAGNOSIS — F17200 Nicotine dependence, unspecified, uncomplicated: Secondary | ICD-10-CM

## 2018-09-25 DIAGNOSIS — Z791 Long term (current) use of non-steroidal anti-inflammatories (NSAID): Secondary | ICD-10-CM

## 2018-09-25 DIAGNOSIS — F329 Major depressive disorder, single episode, unspecified: Secondary | ICD-10-CM | POA: Diagnosis present

## 2018-09-25 DIAGNOSIS — Z833 Family history of diabetes mellitus: Secondary | ICD-10-CM

## 2018-09-25 MED ORDER — ACETAMINOPHEN 325 MG PO TABS: 650.0000 mg | ORAL_TABLET | Freq: Once | ORAL | Status: DC | PRN

## 2018-09-25 NOTE — ED Triage Notes (Signed)
Pt reports cough for 3 weeks. Seen at PCP and given steroid shot last week. States not feeling any better. Reports nephew recently dx with the flu. Fevers at home 101.

## 2018-09-25 NOTE — ED Notes (Signed)
Pt placed on 2L [place] with improvement to 96%.

## 2018-09-25 NOTE — ED Notes (Signed)
Pt states took tylenol around 7pm.

## 2018-09-26 ENCOUNTER — Other Ambulatory Visit: Payer: Self-pay

## 2018-09-26 ENCOUNTER — Encounter (HOSPITAL_COMMUNITY): Payer: Self-pay | Admitting: Internal Medicine

## 2018-09-26 DIAGNOSIS — J441 Chronic obstructive pulmonary disease with (acute) exacerbation: Secondary | ICD-10-CM | POA: Diagnosis present

## 2018-09-26 DIAGNOSIS — J101 Influenza due to other identified influenza virus with other respiratory manifestations: Secondary | ICD-10-CM | POA: Diagnosis not present

## 2018-09-26 LAB — LACTIC ACID, PLASMA
LACTIC ACID, VENOUS: 1.7 mmol/L (ref 0.5–1.9)
Lactic Acid, Venous: 1.1 mmol/L (ref 0.5–1.9)

## 2018-09-26 LAB — BASIC METABOLIC PANEL
Anion gap: 12 (ref 5–15)
BUN: 11 mg/dL (ref 6–20)
CO2: 26 mmol/L (ref 22–32)
Calcium: 8.6 mg/dL — ABNORMAL LOW (ref 8.9–10.3)
Chloride: 99 mmol/L (ref 98–111)
Creatinine, Ser: 0.68 mg/dL (ref 0.44–1.00)
GFR calc Af Amer: >60 mL/min (ref >60)
GFR calc non Af Amer: >60 mL/min (ref >60)
GLUCOSE: 122 mg/dL — AB (ref 70–99)
Potassium: 4.3 mmol/L (ref 3.5–5.1)
Sodium: 137 mmol/L (ref 135–145)

## 2018-09-26 LAB — CBC
HEMATOCRIT: 46.3 % — AB (ref 36.0–46.0)
Hemoglobin: 14.3 g/dL (ref 12.0–15.0)
MCH: 27.7 pg (ref 26.0–34.0)
MCHC: 30.9 g/dL (ref 30.0–36.0)
MCV: 89.6 fL (ref 80.0–100.0)
Platelets: 343 10*3/uL (ref 150–400)
RBC: 5.17 MIL/uL — ABNORMAL HIGH (ref 3.87–5.11)
RDW: 13.2 % (ref 11.5–15.5)
WBC: 10.5 10*3/uL (ref 4.0–10.5)
nRBC: 0 % (ref 0.0–0.2)

## 2018-09-26 LAB — EXPECTORATED SPUTUM ASSESSMENT W GRAM STAIN, RFLX TO RESP C

## 2018-09-26 LAB — INFLUENZA PANEL BY PCR (TYPE A & B)
Influenza A By PCR: POSITIVE — AB
Influenza B By PCR: NEGATIVE

## 2018-09-26 MED ORDER — ONDANSETRON HCL 4 MG/2ML IJ SOLN: 4.0000 mg | Freq: Four times a day (QID) | INTRAMUSCULAR | Status: DC | PRN

## 2018-09-26 MED ORDER — SODIUM CHLORIDE 0.9 % IV BOLUS
1500.0000 mL | Freq: Once | INTRAVENOUS | Status: AC
  Administered 2018-09-26: 1500 mL via INTRAVENOUS

## 2018-09-26 MED ORDER — PAROXETINE HCL 10 MG PO TABS
10.0000 mg | ORAL_TABLET | ORAL | Status: DC
  Administered 2018-09-28: 10 mg via ORAL
  Filled 2018-09-26 (×3): qty 1

## 2018-09-26 MED ORDER — OSELTAMIVIR PHOSPHATE 75 MG PO CAPS
75.0000 mg | ORAL_CAPSULE | Freq: Two times a day (BID) | ORAL | Status: DC
  Administered 2018-09-26 – 2018-09-28 (×5): 75 mg via ORAL
  Filled 2018-09-26 (×6): qty 1

## 2018-09-26 MED ORDER — IBUPROFEN 400 MG PO TABS
800.0000 mg | ORAL_TABLET | Freq: Three times a day (TID) | ORAL | Status: DC | PRN
  Filled 2018-09-26: qty 1

## 2018-09-26 MED ORDER — PREDNISONE 20 MG PO TABS
40.0000 mg | ORAL_TABLET | Freq: Every day | ORAL | Status: DC
  Administered 2018-09-26 – 2018-09-28 (×3): 40 mg via ORAL
  Filled 2018-09-26 (×3): qty 2

## 2018-09-26 MED ORDER — IPRATROPIUM BROMIDE 0.02 % IN SOLN
0.5000 mg | Freq: Three times a day (TID) | RESPIRATORY_TRACT | Status: DC
  Administered 2018-09-26 (×2): 0.5 mg via RESPIRATORY_TRACT
  Filled 2018-09-26 (×2): qty 2.5

## 2018-09-26 MED ORDER — ACETAMINOPHEN 500 MG PO TABS
1000.0000 mg | ORAL_TABLET | Freq: Once | ORAL | Status: AC
  Administered 2018-09-26: 1000 mg via ORAL
  Filled 2018-09-26: qty 2

## 2018-09-26 MED ORDER — ACETAMINOPHEN 650 MG RE SUPP: 650.0000 mg | Freq: Four times a day (QID) | RECTAL | Status: DC | PRN

## 2018-09-26 MED ORDER — ALBUTEROL SULFATE (2.5 MG/3ML) 0.083% IN NEBU
5.0000 mg | INHALATION_SOLUTION | Freq: Once | RESPIRATORY_TRACT | Status: AC
  Administered 2018-09-26: 5 mg via RESPIRATORY_TRACT
  Filled 2018-09-26: qty 6

## 2018-09-26 MED ORDER — LEVALBUTEROL HCL 1.25 MG/0.5ML IN NEBU
1.2500 mg | INHALATION_SOLUTION | Freq: Four times a day (QID) | RESPIRATORY_TRACT | Status: DC
  Filled 2018-09-26: qty 0.5

## 2018-09-26 MED ORDER — ONDANSETRON HCL 4 MG PO TABS: 4.0000 mg | ORAL_TABLET | Freq: Four times a day (QID) | ORAL | Status: DC | PRN

## 2018-09-26 MED ORDER — ACETAMINOPHEN 325 MG PO TABS
650.0000 mg | ORAL_TABLET | Freq: Four times a day (QID) | ORAL | Status: DC | PRN
  Administered 2018-09-28: 650 mg via ORAL
  Filled 2018-09-26: qty 2

## 2018-09-26 MED ORDER — SODIUM CHLORIDE 0.9 % IV BOLUS
1000.0000 mL | Freq: Once | INTRAVENOUS | Status: AC
  Administered 2018-09-26: 1000 mL via INTRAVENOUS

## 2018-09-26 MED ORDER — ENOXAPARIN SODIUM 40 MG/0.4ML ~~LOC~~ SOLN
40.0000 mg | SUBCUTANEOUS | Status: DC
  Administered 2018-09-26 – 2018-09-28 (×3): 40 mg via SUBCUTANEOUS
  Filled 2018-09-26 (×3): qty 0.4

## 2018-09-26 MED ORDER — DM-GUAIFENESIN ER 30-600 MG PO TB12
1.0000 | ORAL_TABLET | Freq: Two times a day (BID) | ORAL | Status: DC | PRN
  Administered 2018-09-26 – 2018-09-27 (×2): 1 via ORAL
  Filled 2018-09-26 (×2): qty 1

## 2018-09-26 MED ORDER — ASPIRIN 81 MG PO CHEW
81.0000 mg | CHEWABLE_TABLET | Freq: Every day | ORAL | Status: DC
  Administered 2018-09-26 – 2018-09-28 (×3): 81 mg via ORAL
  Filled 2018-09-26 (×3): qty 1

## 2018-09-26 MED ORDER — ALBUTEROL SULFATE (2.5 MG/3ML) 0.083% IN NEBU
2.5000 mg | INHALATION_SOLUTION | RESPIRATORY_TRACT | Status: DC | PRN
  Administered 2018-09-27: 2.5 mg via RESPIRATORY_TRACT
  Filled 2018-09-26: qty 3

## 2018-09-26 MED ORDER — LEVALBUTEROL HCL 1.25 MG/0.5ML IN NEBU
1.2500 mg | INHALATION_SOLUTION | Freq: Three times a day (TID) | RESPIRATORY_TRACT | Status: DC
  Administered 2018-09-26 (×2): 1.25 mg via RESPIRATORY_TRACT
  Filled 2018-09-26 (×2): qty 0.5

## 2018-09-26 MED ORDER — LORAZEPAM 1 MG PO TABS
0.5000 mg | ORAL_TABLET | Freq: Once | ORAL | Status: AC
  Administered 2018-09-26: 0.5 mg via ORAL
  Filled 2018-09-26: qty 1

## 2018-09-26 MED ORDER — BUSPIRONE HCL 5 MG PO TABS
5.0000 mg | ORAL_TABLET | Freq: Two times a day (BID) | ORAL | Status: DC
  Filled 2018-09-26 (×6): qty 1

## 2018-09-26 MED ORDER — SODIUM CHLORIDE 0.9 % IV SOLN
1.0000 g | INTRAVENOUS | Status: DC
  Administered 2018-09-26 – 2018-09-27 (×2): 1 g via INTRAVENOUS
  Filled 2018-09-26 (×2): qty 10

## 2018-09-26 MED ORDER — CYCLOBENZAPRINE HCL 10 MG PO TABS
10.0000 mg | ORAL_TABLET | Freq: Three times a day (TID) | ORAL | Status: DC | PRN
  Administered 2018-09-26: 10 mg via ORAL
  Filled 2018-09-26: qty 1

## 2018-09-26 MED ORDER — HYDROCHLOROTHIAZIDE 25 MG PO TABS
12.5000 mg | ORAL_TABLET | Freq: Every day | ORAL | Status: DC
  Administered 2018-09-26 – 2018-09-27 (×2): 12.5 mg via ORAL
  Filled 2018-09-26 (×2): qty 1

## 2018-09-26 MED ORDER — AMOXICILLIN-POT CLAVULANATE 875-125 MG PO TABS
1.0000 | ORAL_TABLET | Freq: Once | ORAL | Status: AC
  Administered 2018-09-26: 1 via ORAL
  Filled 2018-09-26: qty 1

## 2018-09-26 MED ORDER — IPRATROPIUM BROMIDE 0.02 % IN SOLN
0.5000 mg | RESPIRATORY_TRACT | Status: DC
  Administered 2018-09-26: 0.5 mg via RESPIRATORY_TRACT
  Filled 2018-09-26: qty 2.5

## 2018-09-26 MED ORDER — IPRATROPIUM BROMIDE 0.02 % IN SOLN
0.5000 mg | Freq: Once | RESPIRATORY_TRACT | Status: AC
  Administered 2018-09-26: 0.5 mg via RESPIRATORY_TRACT
  Filled 2018-09-26: qty 2.5

## 2018-09-26 MED ORDER — METHYLPREDNISOLONE SODIUM SUCC 125 MG IJ SOLR
125.0000 mg | Freq: Once | INTRAMUSCULAR | Status: AC
  Administered 2018-09-26: 125 mg via INTRAVENOUS
  Filled 2018-09-26: qty 2

## 2018-09-26 MED ORDER — NICOTINE 14 MG/24HR TD PT24
14.0000 mg | MEDICATED_PATCH | Freq: Every day | TRANSDERMAL | Status: DC
  Administered 2018-09-26 – 2018-09-28 (×3): 14 mg via TRANSDERMAL
  Filled 2018-09-26 (×3): qty 1

## 2018-09-26 NOTE — Progress Notes (Signed)
Physical Therapy Evaluation Patient Details Name: Vicki [name] MRN: [id number] DOB: September 03, 1960 Today's Date: 09/26/2018   History of Present Illness  Patient is 58 y/o female presenting to hospital with worsening cough, wheezing, and fever secondary to Influenza A. CXR revealed mild bronchitic changes. PMH includes COPD, tobacco abuse, depression, anxiety, and HTN.   Clinical Impression  Patient admitted to hospital secondary to problems above and with deficits below. Patient required min guard to supervision for ambulation without AD. Oxygen saturations dropped to 85% on RA and required 2L O2 to return to >90%. Patient will benefit from acute physical therapy to maximize independence and safety with functional mobility.     Follow Up Recommendations No PT follow up    Equipment Recommendations  None recommended by PT    Recommendations for Other Services       Precautions / Restrictions Precautions Precautions: Other (comment) Precaution Comments: monitor O2 Restrictions Weight Bearing Restrictions: No      Mobility  Bed Mobility Overal bed mobility: Independent             General bed mobility comments: Patient was independent for all bed mobility   Transfers Overall transfer level: Needs assistance Equipment used: None Transfers: Sit to/from Stand Sit to Stand: Supervision         General transfer comment: Patient required supervision to stand for safety  Ambulation/Gait Ambulation/Gait assistance: Supervision;Min guard Gait Distance (Feet): 150 Feet Assistive device: None Gait Pattern/deviations: Step-through pattern;Decreased step length - right;Decreased step length - left;Decreased stride length Gait velocity: decreased Gait velocity interpretation: 1.31 - 2.62 ft/sec, indicative of limited community ambulator General Gait Details: Patient ambulated with min guard to supervision for safety without AD. No LOB noted. Oxygen saturations dropped to 85%  on RA and did not return with pursed breathing technique. Oxygen saturations returned to >90% when placed on 2L.   Stairs            Wheelchair Mobility    Modified Rankin (Stroke Patients Only)       Balance Overall balance assessment: Needs assistance Sitting-balance support: No upper extremity supported;Feet supported Sitting balance-Leahy Scale: Good     Standing balance support: No upper extremity supported Standing balance-Leahy Scale: Good Standing balance comment: No LOB during gait                              Pertinent Vitals/Pain Pain Assessment: Faces Faces Pain Scale: No hurt    Home Living Family/patient expects to be discharged to:: Private residence Living Arrangements: Other relatives(lives with niece) Available Help at Discharge: Family;Available PRN/intermittently Type of Home: Mobile home Home Access: Stairs to enter Entrance Stairs-Rails: Right;Left;Can reach both Entrance Stairs-Number of Steps: 7 Home Layout: One level Home Equipment: None      Prior Function Level of Independence: Independent               Hand Dominance        Extremity/Trunk Assessment   Upper Extremity Assessment Upper Extremity Assessment: Defer to OT evaluation    Lower Extremity Assessment Lower Extremity Assessment: Overall WFL for tasks assessed    Cervical / Trunk Assessment Cervical / Trunk Assessment: Normal  Communication   Communication: No difficulties  Cognition Arousal/Alertness: Awake/alert Behavior During Therapy: WFL for tasks assessed/performed Overall Cognitive Status: Within Functional Limits for tasks assessed  General Comments General comments (skin integrity, edema, etc.): Patient visitor in room during session.    Exercises     Assessment/Plan    PT Assessment Patient needs continued PT services  PT Problem List Decreased activity tolerance;Decreased  balance;Decreased knowledge of precautions;Decreased mobility;Cardiopulmonary status limiting activity       PT Treatment Interventions Gait training;Stair training;Functional mobility training;Therapeutic activities;Therapeutic exercise;Balance training;Patient/family education    PT Goals (Current goals can be found in the Care Plan section)  Acute Rehab PT Goals Patient Stated Goal: breathe better PT Goal Formulation: With patient Time For Goal Achievement: 10/10/18 Potential to Achieve Goals: Good    Frequency Min 3X/week   Barriers to discharge        Co-evaluation               AM-PAC PT "6 Clicks" Mobility  Outcome Measure Help needed turning from your back to your side while in a flat bed without using bedrails?: None Help needed moving from lying on your back to sitting on the side of a flat bed without using bedrails?: None Help needed moving to and from a bed to a chair (including a wheelchair)?: None Help needed standing up from a chair using your arms (e.g., wheelchair or bedside chair)?: None Help needed to walk in hospital room?: A Little Help needed climbing 3-5 steps with a railing? : A Little 6 Click Score: 22    End of Session Equipment Utilized During Treatment: Gait belt;Oxygen(2L) Activity Tolerance: Treatment limited secondary to medical complications (Comment)(oxygen saturations) Patient left: in bed;with call bell/phone within reach;with family/visitor present Nurse Communication: Mobility status;Other (comment)(oxygen saturations ) PT Visit Diagnosis: Other abnormalities of gait and mobility (R26.89)    Time: 4268-3419 PT Time Calculation (min) (ACUTE ONLY): 13 min   Charges:   PT Evaluation $PT Eval Low Complexity: 1 Low          Erick Blinks, SPT  Erick Blinks 09/26/2018, 1:26 PM

## 2018-09-26 NOTE — H&P (Signed)
History and Physical    Vicki [name] BSJ:[id number] DOB: [birth year] DOA: 09/25/2018  PCP: Sandi Mariscal, MD  Patient coming from: home   I have personally briefly reviewed patient's old medical records available.   Chief Complaint: cough, wheezing, could not breath   HPI: Vicki [name] is a 58 y.o. female with medical history significant of COPD, smoker, depression who presents to the emergency room with 3 weeks of worsening cough and wheezing and 1 day of fever.  According to the patient, she had multiple family members sick at home.  She never had to be hospitalized with her COPD.  She does have fairly good controlled symptoms at home and uses inhaler .  She is not sure whether she uses any long-acting inhalers or nebulizers.  She continues to smoke about 1 pack/day.  She developed cough with greenish sputum and wheezing about 3 weeks ago.  She has used over-the-counter medications.  She has even gone to her primary care physician and was given a steroid injections.  Never used antibiotics.  Yesterday, she started having fever 100 F, she could not breathe so was brought to the ER.  Does have some chest pain along with coughing, however denies any resting pain.  Denies any nausea, vomiting, abdominal pain.  Denies any change in bowel or urine habits.  Denies any dizziness lightheadedness or syncopal episodes. ED Course: Blood pressures stable.  Tachycardic.  Temperature 101.4.  WBC count 10.5.  Chest x-ray essentially normal.  Influenza A positive.  Patient was given bolus IV fluids, IV steroid, Augmentin and started on Tamiflu.  Lactic acid was normal.  Oxygen saturation were 86% on ambulation on room air.  Review of Systems: As per HPI otherwise 10 point review of systems negative.    Past Medical History:  Diagnosis Date  . Acquired hallux valgus of left foot   . Callous ulcer ([place])   . Depression with anxiety   . DEPRESSIVE DISORDER NOT ELSEWHERE CLASSIFIED   . Dyspnea   . Foot  deformity, acquired   . Grief reaction   . Myalgia and myositis, unspecified   . Onychomycosis   . Oral thrush   . Rotator cuff syndrome of left shoulder   . Rotator cuff tear, right   . Shortness of breath   . SHOULDER PAIN, RIGHT   . Sleep disorder   . Urinary hesitancy     Past Surgical History:  Procedure Laterality Date  . cryotherphy  1986  . TUBAL LIGATION       reports that she has been smoking cigarettes. She has been smoking about 0.75 packs per day. She has never used smokeless tobacco. She reports that she does not drink alcohol or use drugs.  No Known Allergies  Family History  Problem Relation Age of Onset  . Heart disease Mother   . Diabetes Mother   . Cancer Mother        breast and ovarian  . Stroke Mother   . Breast cancer Mother        diagnosed in her 80's  . Heart disease Father      Prior to Admission medications   Medication Sig Start Date End Date Taking? Authorizing Provider  albuterol (PROVENTIL HFA;VENTOLIN HFA) 108 (90 BASE) MCG/ACT inhaler Inhale 2 puffs into the lungs every 6 (six) hours as needed for wheezing or shortness of breath. 10/28/14   Willeen Niece, MD  aspirin 81 MG tablet Take 81 mg by mouth daily.  [provider]  busPIRone (BUSPAR) 10 MG tablet Take 0.5 tablets (5 mg total) by mouth 2 (two) times daily. 10/22/15   Leone Brand, MD  cyclobenzaprine (FLEXERIL) 10 MG tablet Take 1 tablet (10 mg total) by mouth 3 (three) times daily as needed for muscle spasms. 02/05/16   Leone Brand, MD  hydrochlorothiazide (HYDRODIURIL) 12.5 MG tablet Take 1 tablet (12.5 mg total) by mouth daily. 01/11/16   Leone Brand, MD  ibuprofen (ADVIL,MOTRIN) 800 MG tablet Take 1 tablet (800 mg total) by mouth every 8 (eight) hours as needed. 02/05/16   Leone Brand, MD  PARoxetine (PAXIL) 10 MG tablet Take 1 tablet (10 mg total) by mouth every morning. 10/22/15   Leone Brand, MD    Physical Exam: Vitals:   09/26/18 0645 09/26/18  0700 09/26/18 0715 09/26/18 0820  BP: (!) 110/53 (!) 107/59 (!) 113/58 123/65  Pulse: (!) 106 (!) 110 (!) 103 (!) 109  Resp: 15 19 17    Temp:    98.1 F (36.7 C)  TempSrc:    Oral  SpO2: 95% 93% 95% 95%  Weight:      Height:        Constitutional: NAD, calm, comfortable Vitals:   09/26/18 0645 09/26/18 0700 09/26/18 0715 09/26/18 0820  BP: (!) 110/53 (!) 107/59 (!) 113/58 123/65  Pulse: (!) 106 (!) 110 (!) 103 (!) 109  Resp: 15 19 17    Temp:    98.1 F (36.7 C)  TempSrc:    Oral  SpO2: 95% 93% 95% 95%  Weight:      Height:       Eyes: PERRL, lids and conjunctivae normal ENMT: Mucous membranes are dry . Posterior pharynx clear of any exudate or lesions.Normal dentition.  Neck: normal, supple, no masses, no thyromegaly Respiratory: Bilateral expiratory wheezes and occasional upper airway conducted sounds.  Normal respiratory effort. No accessory muscle use.  On 2 L oxygen. Cardiovascular: Regular rate and rhythm, no murmurs / rubs / gallops. No extremity edema. 2+ pedal pulses. No carotid bruits.  Abdomen: no tenderness, no masses palpated. No hepatosplenomegaly. Bowel sounds positive.  Musculoskeletal: no clubbing / cyanosis. No joint deformity upper and lower extremities. Good ROM, no contractures. Normal muscle tone.  Skin: no rashes, lesions, ulcers. No induration Neurologic: CN 2-12 grossly intact. Sensation intact, DTR normal. Strength 5/5 in all 4.  Psychiatric: Normal judgment and insight. Alert and oriented x 3. Normal mood.     Labs on Admission: I have personally reviewed following labs and imaging studies  CBC: Recent Labs  Lab 09/26/18 0218  WBC 10.5  HGB 14.3  HCT 46.3*  MCV 89.6  PLT 195   Basic Metabolic Panel: Recent Labs  Lab 09/26/18 0218  NA 137  K 4.3  CL 99  CO2 26  GLUCOSE 122*  BUN 11  CREATININE 0.68  CALCIUM 8.6*   GFR: Estimated Creatinine Clearance: 79 mL/min (by C-G formula based on SCr of 0.68 mg/dL). Liver Function  Tests: No results for input(s): AST, ALT, ALKPHOS, BILITOT, PROT, ALBUMIN in the last 168 hours. No results for input(s): LIPASE, AMYLASE in the last 168 hours. No results for input(s): AMMONIA in the last 168 hours. Coagulation Profile: No results for input(s): INR, PROTIME in the last 168 hours. Cardiac Enzymes: No results for input(s): CKTOTAL, CKMB, CKMBINDEX, TROPONINI in the last 168 hours. BNP (last 3 results) No results for input(s): PROBNP in the last 8760 hours. HbA1C: No results for input(s):  HGBA1C in the last 72 hours. CBG: No results for input(s): GLUCAP in the last 168 hours. Lipid Profile: No results for input(s): CHOL, HDL, LDLCALC, TRIG, CHOLHDL, LDLDIRECT in the last 72 hours. Thyroid Function Tests: No results for input(s): TSH, T4TOTAL, FREET4, T3FREE, THYROIDAB in the last 72 hours. Anemia Panel: No results for input(s): VITAMINB12, FOLATE, FERRITIN, TIBC, IRON, RETICCTPCT in the last 72 hours. Urine analysis:    Component Value Date/Time   COLORURINE YELLOW 09/24/2015 1925   APPEARANCEUR CLEAR 09/24/2015 1925   LABSPEC 1.023 09/24/2015 1925   PHURINE 5.0 09/24/2015 1925   GLUCOSEU NEGATIVE 09/24/2015 1925   GLUCOSEU NEGATIVE 12/15/2006 0741   HGBUR NEGATIVE 09/24/2015 1925   BILIRUBINUR NEGATIVE 09/24/2015 1925   BILIRUBINUR NEG 10/28/2014 1221   KETONESUR NEGATIVE 09/24/2015 1925   PROTEINUR NEGATIVE 09/24/2015 1925   UROBILINOGEN 0.2 10/28/2014 1221   UROBILINOGEN 0.2 mg/dL 12/15/2006 0741   NITRITE NEGATIVE 09/24/2015 1925   LEUKOCYTESUR NEGATIVE 09/24/2015 1925    Radiological Exams on Admission: Dg Chest 2 View  Result Date: 09/25/2018 CLINICAL DATA:  Low oxygen, cough EXAM: CHEST - 2 VIEW COMPARISON:  CT 04/07/2014, 11/07/2013 radiograph FINDINGS: Mild diffuse bronchitic changes and hyperinflation. No focal consolidation or effusion. Normal heart size. No pneumothorax. IMPRESSION: Mild bronchitic changes.  No focal pulmonary infiltrate  Electronically Signed   By: Donavan Foil M.D.   On: 09/25/2018 23:52    EKG: Independently reviewed.  Normal sinus rhythm with occasional PVCs.  Assessment/Plan Principal Problem:   Influenza A Active Problems:   TOBACCO ABUSE   COPD with acute exacerbation ([place])     1.  Acute influenza A infection with COPD exacerbation and hypoxia: Agree to hospitalization given severity of symptoms. Patient is symptomatic for more than 3 weeks, however she started having temperature since 1 day, given severity of symptoms reasonable to treat with Tamiflu for 5 days. Aggressive bronchodilator therapy, oral steroids, scheduled and as needed bronchodilators, deep breathing exercises, incentive spirometry, chest physiotherapy and respiratory therapy consult. Antibiotics due to severity of symptoms.  We will add Rocephin for 5 days.  She can be discharged on Augmentin. Supplemental oxygen to keep saturations more than 92%.  2.  Smoker: Counseled to quit.  Provide nicotine patch.  3.  Anxiety and depression: She is on BuSpar and Paxil that she will continue.  4.Hypertension: Fairly stable.  She is on low-dose hydrochlorothiazide that she can continue.    DVT prophylaxis: Lovenox. Code Status: Full code. Family Communication: Patient with decision-making capacity.  Accompanied by best friend at bedside. Disposition Plan: Home. Consults called: None. Admission status: Observation.   Barb Merino MD [facility] Pager [phone]  If 7PM-7AM, please contact night-coverage [email or web address] Password [facility]  09/26/2018, 8:52 AM

## 2018-09-26 NOTE — ED Provider Notes (Signed)
[facility] EMERGENCY DEPARTMENT Provider Note   CSN: [id number] Arrival date & time: 09/25/18  2135    History   Chief Complaint Chief Complaint  Patient presents with  . Cough    HPI Vicki [name] is a 58 y.o. female with a history of COPD who presents to the emergency department with a chief complaint of shortness of breath.  The patient endorses a productive cough with green sputum for 3 weeks with constant, worsening shortness of breath.  Reports she was seen by her PCP last week and was given 2 injections in the office, 1 of which was a steroid injection.  She reports she started to feel worse after the injections.  She reports she developed a fever yesterday along with headache, body aches, chills.  She reports several family members have been ill with the flu.  She is a current, every day, 1 ppd smoker.  He has been on Symbicort at home, but has been out of the medication for the last 2 days.      The history is provided by the patient. No language interpreter was used.    Past Medical History:  Diagnosis Date  . Acquired hallux valgus of left foot   . Callous ulcer ([place])   . Depression with anxiety   . DEPRESSIVE DISORDER NOT ELSEWHERE CLASSIFIED   . Dyspnea   . Foot deformity, acquired   . Grief reaction   . Myalgia and myositis, unspecified   . Onychomycosis   . Oral thrush   . Rotator cuff syndrome of left shoulder   . Rotator cuff tear, right   . Shortness of breath   . SHOULDER PAIN, RIGHT   . Sleep disorder   . Urinary hesitancy     Patient Active Problem List   Diagnosis Date Noted  . Influenza A 09/26/2018  . Neck pain 02/05/2016  . Leg swelling 01/19/2016  . Dry cough 02/11/2015  . Fatigue 02/11/2015  . Prediabetes 10/29/2014  . Polyuria 10/28/2014  . Hyperglycemia 10/28/2014  . Left breast mass 04/11/2014  . Right hip pain 03/21/2014  . Edema, lower extremity 03/21/2014  . Low back pain 12/17/2013  . Right knee  pain 12/17/2013  . Chest pain, central 11/05/2013  . Lateral epicondylitis of left elbow 11/05/2013  . Obstructive chronic bronchitis without exacerbation ([place]) 08/23/2013  . Wrist fracture, right 07/08/2013  . Sciatica 05/28/2013  . Grief reaction 04/02/2013  . Myalgia and myositis, unspecified 03/07/2012  . Rotator cuff syndrome of left shoulder 03/07/2012  . Depression with anxiety 03/07/2012  . Foot deformity, acquired 06/21/2011  . Callous ulcer ([place]) 06/21/2011  . Acquired hallux valgus of left foot 05/10/2011  . Sleep disorder 02/04/2011  . Rotator cuff tear, right 09/03/2010  . SHOULDER PAIN, RIGHT 08/20/2010  . TOBACCO ABUSE 06/11/2010  . DEPRESSIVE DISORDER NOT ELSEWHERE CLASSIFIED 06/11/2010    Past Surgical History:  Procedure Laterality Date  . cryotherphy  1986  . TUBAL LIGATION       OB History    Gravida  4   Para  3   Term  3   Preterm      AB  1   Living  3     SAB      TAB  1   Ectopic      Multiple      Live Births               Home Medications  Prior to Admission medications   Medication Sig Start Date End Date Taking? Authorizing Provider  albuterol (PROVENTIL HFA;VENTOLIN HFA) 108 (90 BASE) MCG/ACT inhaler Inhale 2 puffs into the lungs every 6 (six) hours as needed for wheezing or shortness of breath. 10/28/14   Willeen Niece, MD  aspirin 81 MG tablet Take 81 mg by mouth daily.    [provider]  busPIRone (BUSPAR) 10 MG tablet Take 0.5 tablets (5 mg total) by mouth 2 (two) times daily. 10/22/15   Leone Brand, MD  cyclobenzaprine (FLEXERIL) 10 MG tablet Take 1 tablet (10 mg total) by mouth 3 (three) times daily as needed for muscle spasms. 02/05/16   Leone Brand, MD  hydrochlorothiazide (HYDRODIURIL) 12.5 MG tablet Take 1 tablet (12.5 mg total) by mouth daily. 01/11/16   Leone Brand, MD  ibuprofen (ADVIL,MOTRIN) 800 MG tablet Take 1 tablet (800 mg total) by mouth every 8 (eight) hours as needed. 02/05/16    Leone Brand, MD  PARoxetine (PAXIL) 10 MG tablet Take 1 tablet (10 mg total) by mouth every morning. 10/22/15   Leone Brand, MD    Family History Family History  Problem Relation Age of Onset  . Heart disease Mother   . Diabetes Mother   . Cancer Mother        breast and ovarian  . Stroke Mother   . Breast cancer Mother        diagnosed in her 96's  . Heart disease Father     Social History Social History   Tobacco Use  . Smoking status: Current Every Day Smoker    Packs/day: 0.75    Types: Cigarettes  . Smokeless tobacco: Never Used  . Tobacco comment: increased smoking again due to stress  Substance Use Topics  . Alcohol use: No  . Drug use: No     Allergies   Patient has no known allergies.   Review of Systems Review of Systems  Constitutional: Positive for chills and fever. Negative for activity change.  HENT: Positive for congestion, ear pain, postnasal drip, sinus pressure and sinus pain. Negative for drooling, ear discharge and sore throat.   Eyes: Negative for visual disturbance.  Respiratory: Positive for cough, chest tightness, shortness of breath and wheezing.   Cardiovascular: Positive for chest pain. Negative for palpitations and leg swelling.  Gastrointestinal: Negative for abdominal pain, blood in stool, diarrhea, nausea and vomiting.  Genitourinary: Negative for dysuria, flank pain, urgency and vaginal pain.  Musculoskeletal: Negative for back pain, neck pain and neck stiffness.  Skin: Negative for wound.  Allergic/Immunologic: Negative for immunocompromised state.  Neurological: Negative for dizziness, syncope, weakness and light-headedness.  Psychiatric/Behavioral: Negative for confusion.     Physical Exam Updated Vital Signs BP 111/73   Pulse (!) 109   Temp 100 F (37.8 C) (Oral)   Resp (!) 21   Ht 5\' 6"  (1.676 m)   Wt 74.4 kg   SpO2 97%   BMI 26.47 kg/m   Physical Exam Vitals signs and nursing note reviewed.    Constitutional:      General: She is not in acute distress.    Comments: Anxious appearing.   HENT:     Head: Normocephalic.     Right Ear: External ear normal.     Left Ear: External ear normal.     Ears:     Comments: Purulent effusions to the TMs bilaterally.  No mastoid tenderness bilaterally.  Bilateral canals are erythematous.  Nose: Congestion present. No rhinorrhea.     Mouth/Throat:     Mouth: Mucous membranes are moist.     Pharynx: Posterior oropharyngeal erythema present. No oropharyngeal exudate.  Eyes:     Extraocular Movements: Extraocular movements intact.     Conjunctiva/sclera: Conjunctivae normal.     Pupils: Pupils are equal, round, and reactive to light.  Neck:     Musculoskeletal: Normal range of motion and neck supple.     Comments: No meningismus  Cardiovascular:     Rate and Rhythm: Normal rate and regular rhythm.     Heart sounds: No murmur. No friction rub. No gallop.   Pulmonary:     Effort: Pulmonary effort is normal. No respiratory distress.     Breath sounds: No stridor. Wheezing present. No rhonchi or rales.     Comments: Diffuse inspiratory and expiratory wheezes in all fields bilaterally.  Abdominal:     General: There is no distension.     Palpations: Abdomen is soft. There is no mass.     Tenderness: There is no abdominal tenderness. There is no right CVA tenderness, left CVA tenderness, guarding or rebound.     Hernia: No hernia is present.  Musculoskeletal:     Right lower leg: No edema.     Left lower leg: No edema.  Skin:    General: Skin is warm.     Findings: No rash.  Neurological:     Mental Status: She is alert.  Psychiatric:        Behavior: Behavior normal.      ED Treatments / Results  Labs (all labs ordered are listed, but only abnormal results are displayed) Labs Reviewed  BASIC METABOLIC PANEL - Abnormal; Notable for the following components:      Result Value   Glucose, Bld 122 (*)    Calcium 8.6 (*)    All  other components within normal limits  CBC - Abnormal; Notable for the following components:   RBC 5.17 (*)    HCT 46.3 (*)    All other components within normal limits  INFLUENZA PANEL BY PCR (TYPE A & B) - Abnormal; Notable for the following components:   Influenza A By PCR POSITIVE (*)    All other components within normal limits  CULTURE, BLOOD (ROUTINE X 2)  CULTURE, BLOOD (ROUTINE X 2)  LACTIC ACID, PLASMA  LACTIC ACID, PLASMA    EKG EKG Interpretation  Date/Time:  Tuesday September 25 2018 21:45:59 EST Ventricular Rate:  122 PR Interval:  122 QRS Duration: 70 QT Interval:  304 QTC Calculation: 433 R Axis:   66 Text Interpretation:  Sinus tachycardia with Premature supraventricular complexes and with occasional Premature ventricular complexes Cannot rule out Anterior infarct , age undetermined Abnormal ECG Confirmed by Pryor Curia [phone]) on 09/26/2018 3:35:46 AM   Radiology Dg Chest 2 View  Result Date: 09/25/2018 CLINICAL DATA:  Low oxygen, cough EXAM: CHEST - 2 VIEW COMPARISON:  CT 04/07/2014, 11/07/2013 radiograph FINDINGS: Mild diffuse bronchitic changes and hyperinflation. No focal consolidation or effusion. Normal heart size. No pneumothorax. IMPRESSION: Mild bronchitic changes.  No focal pulmonary infiltrate Electronically Signed   By: Donavan Foil M.D.   On: 09/25/2018 23:52    Procedures Procedures (including critical care time)  Medications Ordered in ED Medications  oseltamivir (TAMIFLU) capsule 75 mg (has no administration in time range)  sodium chloride 0.9 % bolus 1,500 mL (1,500 mLs Intravenous New Bag/Given 09/26/18 0556)  dextromethorphan-guaiFENesin (MUCINEX DM) 30-600 MG  per 12 hr tablet 1 tablet (has no administration in time range)  ipratropium (ATROVENT) nebulizer solution 0.5 mg (0.5 mg Nebulization Given 09/26/18 0605)  levalbuterol (XOPENEX) nebulizer solution 1.25 mg (1.25 mg Nebulization Not Given 09/26/18 0611)  acetaminophen (TYLENOL)  tablet 1,000 mg (1,000 mg Oral Given 09/26/18 0332)  albuterol (PROVENTIL) (2.5 MG/3ML) 0.083% nebulizer solution 5 mg (5 mg Nebulization Given 09/26/18 0332)  ipratropium (ATROVENT) nebulizer solution 0.5 mg (0.5 mg Nebulization Given 09/26/18 0332)  methylPREDNISolone sodium succinate (SOLU-MEDROL) 125 mg/2 mL injection 125 mg (125 mg Intravenous Given 09/26/18 0400)  LORazepam (ATIVAN) tablet 0.5 mg (0.5 mg Oral Given 09/26/18 0400)  sodium chloride 0.9 % bolus 1,000 mL (0 mLs Intravenous Stopped 09/26/18 0556)  amoxicillin-clavulanate (AUGMENTIN) 875-125 MG per tablet 1 tablet (1 tablet Oral Given 09/26/18 0400)  albuterol (PROVENTIL) (2.5 MG/3ML) 0.083% nebulizer solution 5 mg (5 mg Nebulization Given 09/26/18 0605)     Initial Impression / Assessment and Plan / ED Course  I have reviewed the triage vital signs and the nursing notes.  Pertinent labs & imaging results that were available during my care of the patient were reviewed by me and considered in my medical decision making (see chart for details).        58 year old female with a history of COPD is a current, every day 1 pack/day smoker presenting with flulike symptoms, shortness of breath, chest pain and tightness, and cough.  Shortness of breath and cough have been ongoing over the last 3 weeks and worsening.  SaO2 86% on room air on arrival in the ED and the patient was placed on 2 L nasal cannula improving to the mid 90s.  On my exam, she has inspiratory and expiratory wheezes in all fields with increased work of breathing.  Febrile to 101.4 since arrival and tachycardic in the 120s.  She is normotensive and has had no hypoxia since being placed on 2 L nasal cannula.  Will order chest x-ray, basic labs, influenza panel, nebulizer treatment, Solu-Medrol, and reassess.  Tylenol given and fever has improved.  She remains tachycardic in the 120s.  IV fluid bolus given.  She is positive for influenza A.  WBC is 10.5.  Chest x-ray with  bronchial kidney changes, but no pulmonary infiltrate.  On reexamination, she continues to have wheezing in all fields.  SaO2 removed and she desaturates to 88% on room air at rest.  Given the patient's history of smoking with suspected diagnosis of COPD in the setting of influenza with new oxygen requirement, consult to the hospitalist team and spoke with Dr. Blaine Hamper will accept the patient for admission.  Patient meets sepsis criteria.  She is also been having sinus pain and pressure, bilateral ear pain, and postnasal drip for more than 10 days.  Will start the patient on Augmentin for sinusitis 1 L IV fluid bolus has been given.  She does not require 30 cc/kg bolus since her initial lactate is normal.  Blood cultures x2 have been drawn.  The patient has been seen and evaluated by Dr. Leonides Schanz, attending physician who is in agreement with the work-up and plan.  The patient appears reasonably stabilized for admission considering the current resources, flow, and capabilities available in the ED at this time, and I doubt any other [facility] requiring further screening and/or treatment in the ED prior to admission.   Final Clinical Impressions(s) / ED Diagnoses   Final diagnoses:  Influenza A  COPD with acute exacerbation ([place])  Acute bacterial sinusitis  ED Discharge Orders    None       Joanne Gavel, PA-C 09/26/18 0616    Ward, Delice Bison, DO 09/26/18 [phone]

## 2018-09-26 NOTE — ED Notes (Signed)
ED Provider at bedside. 

## 2018-09-26 NOTE — Evaluation (Signed)
Occupational Therapy Evaluation Patient Details Name: Vicki [name] MRN: [id number] DOB: [birth year] Today's Date: 09/26/2018    History of Present Illness Patient is 58 y/o female presenting to hospital with worsening cough, wheezing, and fever secondary to Influenza A. CXR revealed mild bronchitic changes. PMH includes COPD, tobacco abuse, depression, anxiety, and HTN.    Clinical Impression   Pt admitted with above dx and now presenting with deficits in pulmonary stability and mild balance deficits. Pt performing ADLs at baseline levels with no further need for OT at this time. Provided energy conservation techniques and smoking cessation edu. Thank you for the referral.     Follow Up Recommendations  No OT follow up    Equipment Recommendations  None recommended by OT       Precautions / Restrictions Precautions Precautions: Other (comment) Precaution Comments: monitor O2 Restrictions Weight Bearing Restrictions: No      Mobility Bed Mobility Overal bed mobility: Independent             General bed mobility comments: No physical assist or cueing  Transfers Overall transfer level: Modified independent Equipment used: None Transfers: Sit to/from [facility] Sit to Stand: Modified independent (Device/Increase time) Stand pivot transfers: Modified independent (Device/Increase time)       General transfer comment: pt had no LOB during toilet transfer or functional mobility in room    Balance Overall balance assessment: Mild deficits observed, not formally tested                                         ADL either performed or assessed with clinical judgement   ADL Overall ADL's : Modified independent;At baseline                                       General ADL Comments: Pt with activity tolerance deficits but no change in ADL performance overall     Vision Baseline Vision/History: Wears glasses Wears  Glasses: At all times Patient Visual Report: No change from baseline Vision Assessment?: No apparent visual deficits            Pertinent Vitals/Pain Pain Assessment: No/denies pain     Hand Dominance Right   Extremity/Trunk Assessment Upper Extremity Assessment Upper Extremity Assessment: Overall WFL for tasks assessed(Previous rotator cuff tear R)   Lower Extremity Assessment Lower Extremity Assessment: Overall WFL for tasks assessed   Cervical / Trunk Assessment Cervical / Trunk Assessment: Normal   Communication Communication Communication: No difficulties   Cognition Arousal/Alertness: Awake/alert Behavior During Therapy: WFL for tasks assessed/performed Overall Cognitive Status: Within Functional Limits for tasks assessed                                     General Comments  Pt had visitor present throughout session, reports good support            Home Living Family/patient expects to be discharged to:: Private residence Living Arrangements: Other relatives(niece) Available Help at Discharge: Family;Available PRN/intermittently Type of Home: Mobile home Home Access: Stairs to enter Entrance Stairs-Number of Steps: 7 Entrance Stairs-Rails: Right;Left;Can reach both Home Layout: One level     Bathroom Shower/Tub: Tub/shower unit;Walk-in shower   Bathroom Toilet: Standard  Home Equipment: None          Prior Functioning/Environment Level of Independence: Independent                 OT Problem List: Decreased activity tolerance      OT Treatment/Interventions:      OT Goals(Current goals can be found in the care plan section) Acute Rehab OT Goals OT Goal Formulation: All assessment and education complete, DC therapy   AM-PAC OT "6 Clicks" Daily Activity     Outcome Measure Help from another person eating meals?: None Help from another person taking care of personal grooming?: None Help from another person toileting,  which includes using toliet, bedpan, or urinal?: None Help from another person bathing (including washing, rinsing, drying)?: None Help from another person to put on and taking off regular upper body clothing?: None Help from another person to put on and taking off regular lower body clothing?: None 6 Click Score: 24   End of Session Equipment Utilized During Treatment: Oxygen  Activity Tolerance: Patient tolerated treatment well Patient left: in bed;with call bell/phone within reach;with family/visitor present  OT Visit Diagnosis: Unsteadiness on feet (R26.81)                Time: 2919-1660 OT Time Calculation (min): 20 min Charges:  OT General Charges $OT Visit: 1 Visit OT Evaluation $OT Eval Low Complexity: 1 Low  Curtis Sites OTR/L  09/26/2018, 4:55 PM

## 2018-09-26 NOTE — Progress Notes (Signed)
SATURATION QUALIFICATIONS: (This note is used to comply with regulatory documentation for home oxygen)  Patient Saturations on Room Air at Rest = 92%  Patient Saturations on Room Air while Ambulating = 85%  Patient Saturations on 2 Liters of oxygen while Ambulating = 92%  Please briefly explain why patient needs home oxygen: Patient oxygen saturations dropped to 85% on RA. Required 2L of O2 to return to >90%. Patient will require supplemental oxygen to maintain adequate oxygen saturations.   Erick Blinks, SPT

## 2018-09-26 NOTE — Care Management (Signed)
This is a no charge note  Pending admission per PA, Vicki [name]  58 year old lady with past medical history of tobacco abuse, possible COPD, depression with anxiety, who presents with productive cough, shortness breath, fever and chills.  Oxygen desaturation to 86% on room air.  Flu A PCR positive.  Chest x-ray showed bronchitic change without infiltration.  Patient meets criteria for sepsis with fever, tachycardia and tachypnea.  WBC 10.5.  Patient is placed on telemetry bed for observation.  Tamiflu started.   Please call manager of [facility] at [phone] when pt arrives to floor   Ivor Costa, MD  [facility]   If 7PM-7AM, please contact night-coverage [email or web address] Password [facility] 09/26/2018, 5:33 AM

## 2018-09-26 NOTE — ED Notes (Signed)
RN Aaron Edelman informed of pt vitals

## 2018-09-27 DIAGNOSIS — J019 Acute sinusitis, unspecified: Secondary | ICD-10-CM | POA: Diagnosis present

## 2018-09-27 DIAGNOSIS — F17200 Nicotine dependence, unspecified, uncomplicated: Secondary | ICD-10-CM

## 2018-09-27 DIAGNOSIS — J9601 Acute respiratory failure with hypoxia: Secondary | ICD-10-CM | POA: Diagnosis present

## 2018-09-27 DIAGNOSIS — Z791 Long term (current) use of non-steroidal anti-inflammatories (NSAID): Secondary | ICD-10-CM | POA: Diagnosis not present

## 2018-09-27 DIAGNOSIS — F419 Anxiety disorder, unspecified: Secondary | ICD-10-CM | POA: Diagnosis present

## 2018-09-27 DIAGNOSIS — F329 Major depressive disorder, single episode, unspecified: Secondary | ICD-10-CM | POA: Diagnosis present

## 2018-09-27 DIAGNOSIS — I10 Essential (primary) hypertension: Secondary | ICD-10-CM | POA: Diagnosis present

## 2018-09-27 DIAGNOSIS — Z8249 Family history of ischemic heart disease and other diseases of the circulatory system: Secondary | ICD-10-CM | POA: Diagnosis not present

## 2018-09-27 DIAGNOSIS — Z7982 Long term (current) use of aspirin: Secondary | ICD-10-CM | POA: Diagnosis not present

## 2018-09-27 DIAGNOSIS — J441 Chronic obstructive pulmonary disease with (acute) exacerbation: Secondary | ICD-10-CM

## 2018-09-27 DIAGNOSIS — Z833 Family history of diabetes mellitus: Secondary | ICD-10-CM | POA: Diagnosis not present

## 2018-09-27 DIAGNOSIS — F17210 Nicotine dependence, cigarettes, uncomplicated: Secondary | ICD-10-CM | POA: Diagnosis present

## 2018-09-27 DIAGNOSIS — Z823 Family history of stroke: Secondary | ICD-10-CM | POA: Diagnosis not present

## 2018-09-27 DIAGNOSIS — Z79899 Other long term (current) drug therapy: Secondary | ICD-10-CM | POA: Diagnosis not present

## 2018-09-27 DIAGNOSIS — B9689 Other specified bacterial agents as the cause of diseases classified elsewhere: Secondary | ICD-10-CM

## 2018-09-27 DIAGNOSIS — J101 Influenza due to other identified influenza virus with other respiratory manifestations: Secondary | ICD-10-CM | POA: Diagnosis not present

## 2018-09-27 DIAGNOSIS — Z803 Family history of malignant neoplasm of breast: Secondary | ICD-10-CM | POA: Diagnosis not present

## 2018-09-27 LAB — HIV ANTIBODY (ROUTINE TESTING W REFLEX): HIV Screen 4th Generation wRfx: NONREACTIVE

## 2018-09-27 MED ORDER — BENZONATATE 100 MG PO CAPS
200.0000 mg | ORAL_CAPSULE | Freq: Two times a day (BID) | ORAL | Status: DC | PRN
  Administered 2018-09-28: 200 mg via ORAL
  Filled 2018-09-27: qty 2

## 2018-09-27 MED ORDER — AMOXICILLIN-POT CLAVULANATE 875-125 MG PO TABS
1.0000 | ORAL_TABLET | Freq: Two times a day (BID) | ORAL | Status: DC
  Administered 2018-09-28: 1 via ORAL
  Filled 2018-09-27: qty 1

## 2018-09-27 MED ORDER — IPRATROPIUM-ALBUTEROL 0.5-2.5 (3) MG/3ML IN SOLN
3.0000 mL | Freq: Two times a day (BID) | RESPIRATORY_TRACT | Status: DC
  Administered 2018-09-27 – 2018-09-28 (×2): 3 mL via RESPIRATORY_TRACT
  Filled 2018-09-27 (×2): qty 3

## 2018-09-27 MED ORDER — BENZONATATE 100 MG PO CAPS
200.0000 mg | ORAL_CAPSULE | ORAL | Status: AC
  Administered 2018-09-27: 200 mg via ORAL
  Filled 2018-09-27: qty 2

## 2018-09-27 MED ORDER — IPRATROPIUM-ALBUTEROL 0.5-2.5 (3) MG/3ML IN SOLN: 3.0000 mL | Freq: Four times a day (QID) | RESPIRATORY_TRACT | Status: DC

## 2018-09-27 NOTE — Progress Notes (Signed)
Physical Therapy Discharge Patient Details Name: Vicki [name] MRN: [id number] DOB: Apr 20, 1961 Today's Date: 09/27/2018 Time: 1290-4753 PT Time Calculation (min) (ACUTE ONLY): 18 min  Patient discharged from PT services secondary to goals met and no further PT needs identified.  Please see latest therapy progress note for current level of functioning and progress toward goals.    Progress and discharge plan discussed with patient and/or caregiver: Patient/Caregiver agrees with plan  See treatment note for further details.       Erick Blinks, SPT  Erick Blinks 09/27/2018, 12:45 PM

## 2018-09-27 NOTE — Progress Notes (Signed)
Pt transferred to unit via staff in stable condition. Pt placed on telemetry, bed in lowest position, and call bell within reach. Pt instructed to call when help is needed. Pt is in no distress and skin is dry and intact. Will continue to monitor.

## 2018-09-27 NOTE — Progress Notes (Signed)
Physical Therapy Treatment/ Discharge Patient Details Name: Vicki [name] MRN: [id number] DOB: 05-30-1961 Today's Date: 09/27/2018    History of Present Illness Patient is 58 y/o female presenting to hospital with worsening cough, wheezing, and fever secondary to Influenza A. CXR revealed mild bronchitic changes. PMH includes COPD, tobacco abuse, depression, anxiety, and HTN.     PT Comments    Patient tolerated session well. Patient ambulated with supervision to min guard with no LOB during dynamic gait tasks. Patient required min guard for stair navigation and use of L hand rail. Oxygen saturations dropped to 88% on 2L following functional mobility but returned to >90% with pursed lip breathing technique. Educated patient about energy conservation techniques at home following d/c. Patient reports adequate assist at home and reports comfort with mobility tasks. No further acute PT needs identified. All education has been completed and the patient has no further questions. See below for any follow-up Physical Therapy or equipment needs. PT is signing off. Thank you for this referral.    Follow Up Recommendations  No PT follow up     Equipment Recommendations  None recommended by PT    Recommendations for Other Services       Precautions / Restrictions Precautions Precautions: Other (comment) Precaution Comments: monitor O2 Restrictions Weight Bearing Restrictions: No    Mobility  Bed Mobility Overal bed mobility: Independent             General bed mobility comments: Patient required no physical assist or cueing  Transfers Overall transfer level: Modified independent Equipment used: None Transfers: Sit to/from Stand Sit to Stand: Modified independent (Device/Increase time)         General transfer comment: Patient at [place] level to stand. No LOB noted  Ambulation/Gait Ambulation/Gait assistance: Supervision;Min guard Gait Distance (Feet): 200 Feet Assistive  device: None Gait Pattern/deviations: Step-through pattern;Decreased step length - right;Decreased step length - left;Decreased stride length Gait velocity: decreased Gait velocity interpretation: 1.31 - 2.62 ft/sec, indicative of limited community ambulator General Gait Details: Patient ambulated with supervision to min guard for safety without AD. No LOB during dynamic gait tasks. Oxygen saturations dropped to 88% on 2L and returned to >90% following pursed lip breathing technique. Patient required 1 standing rest break during ambulation.    Stairs Stairs: Yes Stairs assistance: Min guard Stair Management: One rail Left;Alternating pattern;Backwards;Forwards Number of Stairs: 4 General stair comments: Patient required min guard for stair navigation for safety. Patient used hand rail on L during stair navigation. No LOB noted.    Wheelchair Mobility    Modified Rankin (Stroke Patients Only)       Balance Overall balance assessment: Needs assistance Sitting-balance support: No upper extremity supported;Feet supported Sitting balance-Leahy Scale: Good     Standing balance support: No upper extremity supported Standing balance-Leahy Scale: Good Standing balance comment: No LOB during gait                  Standardized Balance Assessment Standardized Balance Assessment : Dynamic Gait Index   Dynamic Gait Index Level Surface: Normal Change in Gait Speed: Mild Impairment Gait with Horizontal Head Turns: Normal Gait with Vertical Head Turns: Normal Step Over Obstacle: Normal Step Around Obstacles: Normal Steps: Mild Impairment      Cognition Arousal/Alertness: Awake/alert Behavior During Therapy: WFL for tasks assessed/performed Overall Cognitive Status: Within Functional Limits for tasks assessed  Exercises      General Comments General comments (skin integrity, edema, etc.): Educated patient about energy  conservation techniques following d/c at home. Patient reports having assistance at home cooking and completing household chores.       Pertinent Vitals/Pain Pain Assessment: No/denies pain    Home Living                      Prior Function            PT Goals (current goals can now be found in the care plan section) Acute Rehab PT Goals Patient Stated Goal: go home PT Goal Formulation: With patient Time For Goal Achievement: 10/10/18 Potential to Achieve Goals: Good Progress towards PT goals: Goals met/education completed, patient discharged from PT    Frequency    Min 3X/week      PT Plan Current plan remains appropriate    Co-evaluation              AM-PAC PT "6 Clicks" Mobility   Outcome Measure  Help needed turning from your back to your side while in a flat bed without using bedrails?: None Help needed moving from lying on your back to sitting on the side of a flat bed without using bedrails?: None Help needed moving to and from a bed to a chair (including a wheelchair)?: None Help needed standing up from a chair using your arms (e.g., wheelchair or bedside chair)?: None Help needed to walk in hospital room?: None Help needed climbing 3-5 steps with a railing? : A Little 6 Click Score: 23    End of Session Equipment Utilized During Treatment: Gait belt;Oxygen(2L) Activity Tolerance: Patient tolerated treatment well Patient left: in bed;with call bell/phone within reach;with family/visitor present Nurse Communication: Mobility status PT Visit Diagnosis: Other abnormalities of gait and mobility (R26.89)     Time: 7619-5093 PT Time Calculation (min) (ACUTE ONLY): 18 min  Charges:  $Gait Training: 8-22 mins                     Erick Blinks, SPT  Erick Blinks 09/27/2018, 12:45 PM

## 2018-09-27 NOTE — Progress Notes (Signed)
Report given to Ciara, RN, transfer to 5w11 Family at bedside updated and belongings sent with patient.

## 2018-09-27 NOTE — Care Management Obs Status (Signed)
[facility] NOTIFICATION   Patient Details  Name: Jaliya Siegmann MRN: [id number] Date of Birth: 1961/02/06   Medicare Observation Status Notification Given:  Yes    Carles Collet, RN 09/27/2018, 9:01 AM

## 2018-09-28 LAB — BASIC METABOLIC PANEL
Anion gap: 8 (ref 5–15)
BUN: 11 mg/dL (ref 6–20)
CO2: 36 mmol/L — ABNORMAL HIGH (ref 22–32)
Calcium: 9 mg/dL (ref 8.9–10.3)
Chloride: 94 mmol/L — ABNORMAL LOW (ref 98–111)
Creatinine, Ser: 0.58 mg/dL (ref 0.44–1.00)
GFR calc Af Amer: >60 mL/min (ref >60)
GFR calc non Af Amer: >60 mL/min (ref >60)
Glucose, Bld: 98 mg/dL (ref 70–99)
Potassium: 3.8 mmol/L (ref 3.5–5.1)
Sodium: 138 mmol/L (ref 135–145)

## 2018-09-28 LAB — CULTURE, RESPIRATORY W GRAM STAIN: Culture: NORMAL

## 2018-09-28 MED ORDER — GUAIFENESIN ER 600 MG PO TB12
600.0000 mg | ORAL_TABLET | Freq: Two times a day (BID) | ORAL | Status: DC
  Administered 2018-09-28: 600 mg via ORAL
  Filled 2018-09-28: qty 1

## 2018-09-28 MED ORDER — OSELTAMIVIR PHOSPHATE 75 MG PO CAPS: 75.0000 mg | ORAL_CAPSULE | Freq: Two times a day (BID) | ORAL | 0 refills | Status: DC

## 2018-09-28 MED ORDER — HYDRALAZINE HCL 20 MG/ML IJ SOLN
10.0000 mg | INTRAMUSCULAR | Status: DC | PRN
  Administered 2018-09-28: 10 mg via INTRAVENOUS
  Filled 2018-09-28: qty 1

## 2018-09-28 MED ORDER — PREDNISONE 5 MG PO TABS: ORAL_TABLET | ORAL | 0 refills | Status: DC

## 2018-09-28 NOTE — Progress Notes (Signed)
Pt given discharge instructions, prescriptions, and care notes. Pt verbalized understanding AEB no further questions or concerns at this time. IV was discontinued, no redness, pain, or swelling noted at this time. Telemetry discontinued and Centralized Telemetry was notified. Pt left the floor via wheelchair with staff in stable condition. Skin dry and intact. 

## 2018-09-28 NOTE — Progress Notes (Addendum)
SATURATION QUALIFICATIONS: (This note is used to comply with regulatory documentation for home oxygen)  Patient Saturations on Room Air at Rest = 93%  Patient Saturations on Room Air while Ambulating = 86%  Patient Saturations on 2 Liters of oxygen while Ambulating = 92%  Please briefly explain why patient needs home oxygen: Pt oxygen saturation decreased while ambulating on room air and increased while ambulating on 2L of O2.

## 2018-09-28 NOTE — Discharge Summary (Signed)
Vicki [name] JGO:[id number] DOB: [birth year] DOA: 09/25/2018  PCP: Sandi Mariscal, MD  Admit date: 09/25/2018  Discharge date: 09/28/2018  Admitted From: Home   Disposition:  Home   Recommendations for Outpatient Follow-up:   Follow up with PCP in 1-2 weeks  PCP Please obtain BMP/CBC, 2 view CXR in 1week,  (see Discharge instructions)   PCP Please follow up on the following pending results:    Home Health: None   Equipment/Devices: o2  Consultations: None Discharge Condition: Stable   CODE STATUS: Full   Diet Recommendation: Heart Healthy Low Carb    Chief Complaint  Patient presents with  . Cough     Brief history of present illness from the day of admission and additional interim summary    Vicki [name] is an 58 y.o. female with past medical history significant for COPD, tobacco abuse, and depression; who presents with complaints of cough, wheezing, and shortness of breath.  Ports receiving influenza vaccine, but was found to be positive for Tamiflu.                                                                 Hospital Course    Hypoxic respiratory failure caused by influenza A bronchitis causing COPD exacerbation in a patient with ongoing smoking and underlying COPD -   He was treated with steroids, Tamiflu, nebulizer treatments and oxygen, now close to baseline, qualifies for home oxygen as upon ambulation she does drop below 87% on room air.  Physical exam has improved, she has minimal to no wheezing although at baseline she moves only moderate amounts of air bilaterally.  She has been counseled to quit smoking, she will be placed on combination of Tamiflu, steroid taper, will get home oxygen.  Request PCP to consider one-time outpatient pulmonary follow-up.  She will continue her home nebulizer  treatments.      Tobacco abuse: Counseled on the need of cessation tobacco use.   Anxiety and depression: Continue Paxil and BuSpar  Essential hypertension: Home Rx      Discharge diagnosis     Principal Problem:   Influenza A Active Problems:   TOBACCO ABUSE   COPD with acute exacerbation ([place])    Discharge instructions    Discharge Instructions    Diet - low sodium heart healthy   Complete by:  As directed    Discharge instructions   Complete by:  As directed    Follow with Primary MD Sandi Mariscal, MD in 7 days   Get CBC, CMP, 2 view Chest X ray -  checked  by Primary MD  in 5-7 days    Activity: As tolerated with Full fall precautions use walker/cane & assistance as needed  Disposition Home   Diet: Heart Healthy    Special Instructions: If  you have smoked or chewed Tobacco  in the last 2 yrs please stop smoking, stop any regular Alcohol  and or any Recreational drug use.  On your next visit with your primary care physician please Get Medicines reviewed and adjusted.  Please request your Prim.MD to go over all Hospital Tests and Procedure/Radiological results at the follow up, please get all Hospital records sent to your Prim MD by signing hospital release before you go home.  If you experience worsening of your admission symptoms, develop shortness of breath, life threatening emergency, suicidal or homicidal thoughts you must seek medical attention immediately by calling 911 or calling your MD immediately  if symptoms less severe.  You Must read complete instructions/literature along with all the possible adverse reactions/side effects for all the Medicines you take and that have been prescribed to you. Take any new Medicines after you have completely understood and accpet all the possible adverse reactions/side effects.   For home use only DME oxygen   Complete by:  As directed    Mode or (Route):  Nasal cannula   Liters per Minute:  2   Frequency:  Continuous  (stationary and portable oxygen unit needed)   Oxygen conserving device:  Yes   Oxygen delivery system:  Gas   Increase activity slowly   Complete by:  As directed       Discharge Medications   Allergies as of 09/28/2018   No Known Allergies     Medication List    STOP taking these medications   cyclobenzaprine 10 MG tablet Commonly known as:  FLEXERIL   ibuprofen 800 MG tablet Commonly known as:  ADVIL,MOTRIN     TAKE these medications   albuterol 108 (90 Base) MCG/ACT inhaler Commonly known as:  PROVENTIL HFA;VENTOLIN HFA Inhale 2 puffs into the lungs every 6 (six) hours as needed for wheezing or shortness of breath.   aspirin 81 MG tablet Take 81 mg by mouth daily.   atorvastatin 10 MG tablet Commonly known as:  LIPITOR Take 10 mg by mouth daily.   budesonide-formoterol 160-4.5 MCG/ACT inhaler Commonly known as:  SYMBICORT Inhale 2 puffs into the lungs 2 (two) times daily.   busPIRone 10 MG tablet Commonly known as:  BUSPAR Take 0.5 tablets (5 mg total) by mouth 2 (two) times daily.   hydrochlorothiazide 12.5 MG tablet Commonly known as:  HYDRODIURIL Take 1 tablet (12.5 mg total) by mouth daily.   metFORMIN 500 MG tablet Commonly known as:  GLUCOPHAGE Take 500 mg by mouth 2 (two) times daily with a meal.   omeprazole 40 MG capsule Commonly known as:  PRILOSEC Take 40 mg by mouth daily.   oseltamivir 75 MG capsule Commonly known as:  TAMIFLU Take 1 capsule (75 mg total) by mouth 2 (two) times daily.   oxybutynin 5 MG tablet Commonly known as:  DITROPAN Take 5 mg by mouth daily.   PARoxetine 10 MG tablet Commonly known as:  PAXIL Take 1 tablet (10 mg total) by mouth every morning.   predniSONE 5 MG tablet Commonly known as:  DELTASONE Label  & dispense according to the schedule below. 10 Pills PO for 3 days then, 8 Pills PO for 3 days, 6 Pills PO for 3 days, 4 Pills PO for 3 days, 2 Pills PO for 3 days, 1 Pills PO for 3 days, 1/2 Pill  PO for 3  days then STOP. Total 95 pills.            Durable  Medical Equipment  (From admission, onward)         Start     Ordered   09/28/18 0000  For home use only DME oxygen    Question Answer Comment  Mode or (Route) Nasal cannula   Liters per Minute 2   Frequency Continuous (stationary and portable oxygen unit needed)   Oxygen conserving device Yes   Oxygen delivery system Gas      09/28/18 0830          Follow-up Information    Sandi Mariscal, MD. Schedule an appointment as soon as possible for a visit in 1 week(s).   Specialty:  Internal Medicine Contact information: [place] [address] [phone]           Major procedures and Radiology Reports - PLEASE review detailed and final reports thoroughly  -       Dg Chest 2 View  Result Date: 09/25/2018 CLINICAL DATA:  Low oxygen, cough EXAM: CHEST - 2 VIEW COMPARISON:  CT 04/07/2014, 11/07/2013 radiograph FINDINGS: Mild diffuse bronchitic changes and hyperinflation. No focal consolidation or effusion. Normal heart size. No pneumothorax. IMPRESSION: Mild bronchitic changes.  No focal pulmonary infiltrate Electronically Signed   By: Donavan Foil M.D.   On: 09/25/2018 23:52    Micro Results     Recent Results (from the past 240 hour(s))  Blood culture (routine x 2)     Status: None (Preliminary result)   Collection Time: 09/26/18  3:40 AM  Result Value Ref Range Status   Specimen Description BLOOD LEFT ARM  Final   Special Requests   Final    BOTTLES DRAWN AEROBIC AND ANAEROBIC Blood Culture adequate volume Performed at [facility], [place] [address] [place], [place] [address]    Culture NO GROWTH 2 DAYS  Final   Report Status PENDING  Incomplete  Blood culture (routine x 2)     Status: None (Preliminary result)   Collection Time: 09/26/18  3:50 AM  Result Value Ref Range Status   Specimen Description BLOOD RIGHT ARM  Final   Special Requests   Final    BOTTLES DRAWN AEROBIC AND  ANAEROBIC Blood Culture adequate volume Performed at [facility], [place] [address], [place], [place] [address]    Culture NO GROWTH 2 DAYS  Final   Report Status PENDING  Incomplete  Culture, sputum-assessment     Status: None   Collection Time: 09/26/18  3:56 PM  Result Value Ref Range Status   Specimen Description EXPECTORATED SPUTUM  Final   Special Requests NONE  Final   Sputum evaluation   Final    THIS SPECIMEN IS ACCEPTABLE FOR SPUTUM CULTURE Performed at [facility], [place] [address], [place], [place] [address]    Report Status 09/26/2018 FINAL  Final  Culture, respiratory     Status: None (Preliminary result)   Collection Time: 09/26/18  3:56 PM  Result Value Ref Range Status   Specimen Description EXPECTORATED SPUTUM  Final   Special Requests NONE Reflexed from [id number]  Final   Gram Stain   Final    ABUNDANT WBC PRESENT,BOTH PMN AND MONONUCLEAR FEW GRAM POSITIVE COCCI RARE GRAM VARIABLE ROD    Culture   Final    CULTURE REINCUBATED FOR BETTER GROWTH Performed at [facility], [place] [address] [place], [place] [address]    Report Status PENDING  Incomplete    Today   Subjective    Vicki [name]  today has no headache,no chest abdominal pain,no new weakness tingling or numbness, feels much better wants to go home today.     Objective   Blood pressure (!) 150/95, pulse 92, temperature 98.5 F (36.9 C), temperature source Oral, resp. rate 16, height 5\' 6"  (1.676 m), weight 77.8 kg, SpO2 (!) 84 %.   Intake/Output Summary (Last 24 hours) at 09/28/2018 0832 Last data filed at 09/27/2018 1400 Gross per 24 hour  Intake 100 ml  Output -  Net 100 ml    Exam Awake Alert, Oriented x 3, No new F.N deficits, Normal affect [place].AT,PERRAL Supple Neck,No JVD, No cervical lymphadenopathy appriciated.  Symmetrical Chest wall movement, Mod air movement bilaterally, CTAB RRR,No Gallops,Rubs or new Murmurs, No Parasternal Heave +ve B.Sounds, Abd Soft, Non  tender, No organomegaly appriciated, No rebound -guarding or rigidity. No Cyanosis, Clubbing or edema, No new Rash or bruise   Data Review   CBC w Diff:  Lab Results  Component Value Date   WBC 10.5 09/26/2018   HGB 14.3 09/26/2018   HCT 46.3 (H) 09/26/2018   PLT 343 09/26/2018   LYMPHOPCT 21.4 12/15/2006   MONOPCT 6.7 12/15/2006   EOSPCT 1.8 12/15/2006   BASOPCT 0.4 12/15/2006    CMP:  Lab Results  Component Value Date   NA 138 09/28/2018   K 3.8 09/28/2018   CL 94 (L) 09/28/2018   CO2 36 (H) 09/28/2018   BUN 11 09/28/2018   CREATININE 0.58 09/28/2018   CREATININE 0.53 01/11/2016   PROT 6.1 (L) 09/24/2015   ALBUMIN 3.5 09/24/2015   BILITOT 0.5 09/24/2015   ALKPHOS 74 09/24/2015   AST 27 09/24/2015   ALT 22 09/24/2015  .   Total Time in preparing paper work, data evaluation and todays exam - 63 minutes  Lala Lund M.D on 09/28/2018 at 8:32 AM  [facility]   Office  [phone]

## 2018-09-28 NOTE — Discharge Instructions (Signed)
Follow with Primary MD Sandi Mariscal, MD in 7 days   Get CBC, CMP, 2 view Chest X ray -  checked  by Primary MD  in 5-7 days    Activity: As tolerated with Full fall precautions use walker/cane & assistance as needed  Disposition Home   Diet: Heart Healthy    Special Instructions: If you have smoked or chewed Tobacco  in the last 2 yrs please stop smoking, stop any regular Alcohol  and or any Recreational drug use.  On your next visit with your primary care physician please Get Medicines reviewed and adjusted.  Please request your Prim.MD to go over all Hospital Tests and Procedure/Radiological results at the follow up, please get all Hospital records sent to your Prim MD by signing hospital release before you go home.  If you experience worsening of your admission symptoms, develop shortness of breath, life threatening emergency, suicidal or homicidal thoughts you must seek medical attention immediately by calling 911 or calling your MD immediately  if symptoms less severe.  You Must read complete instructions/literature along with all the possible adverse reactions/side effects for all the Medicines you take and that have been prescribed to you. Take any new Medicines after you have completely understood and accpet all the possible adverse reactions/side effects.

## 2018-09-28 NOTE — Progress Notes (Signed)
Pt refuse Buspar and stated that she stop taking buspar, Paxil and some other med since her husband pass away couple years ago. Pass it on on report to notified MD.

## 2018-09-28 NOTE — Progress Notes (Addendum)
Progress Note    Vicki [name]  ZRA:[id number] DOB: 1961-03-06  DOA: 09/25/2018 PCP: Sandi Mariscal, MD    Brief Narrative:   Chief complaint: Cough and shortness of breath  Medical records reviewed and are as summarized below:  Vicki [name] is an 58 y.o. female with past medical history significant for COPD, tobacco abuse, and depression; who presents with complaints of cough, wheezing, and shortness of breath.  Ports receiving influenza vaccine, but was found to be positive for Tamiflu.  Assessment/Plan:   Principal Problem:   Influenza A Active Problems:   TOBACCO ABUSE   COPD with acute exacerbation (HCC)  Influenza a, COPD exacerbation, and respiratory failure with hypoxia: Patient reported receiving her influenza vaccine: Acute.  O2 saturations as low as 86% on room air and acutely wheezing.  Normally not on oxygen at home.  Suspect symptoms multifactorial given influenza and patient's reports that she continues to smoke tobacco. -Wean nasal cannula oxygen as tolerated -Continue Tamiflu -IV Rocephin changed to Augmentin p.o. for empiric coverage -Scheduled breathing treatments -Prednisone 40 mg daily -Flutter valve -Continue Mucinex DM -Ambulate patient with pulse oximetry  Tobacco abuse: Counseled on the need of cessation tobacco use. -Nicotine patch  Anxiety and depression: -Continue Paxil and BuSpar  Essential hypertension: Blood pressures mildly elevated 154/90. -Hold hydrochlorothiazide -Hydralazine IV as needed  Body mass index is 27.68 kg/m.   Family Communication/Anticipated D/C date and plan/Code Status   DVT prophylaxis: Lovenox ordered. Code Status: Full Code.  Family Communication: Discussed plan of care with the patient and family present at bedside Disposition Plan: Possible discharge home in 1 to 3 days   Medical Consultants:    None.   Anti-Infectives:    Tamiflu day 2  Rocephin day 2 discontinued and Augmentin day  1(total effective antibiotic day 2)  Subjective:   Patient still reports feeling very weak and when she coughs feels as though something is stuck in her throat that she is unable to get up.  Objective:    Vitals:   09/27/18 1227 09/27/18 1800 09/27/18 1936 09/27/18 2219  BP: (!) 151/90  (!) 154/99   Pulse: (!) 105  98   Resp: 16  16   Temp: 98.9 F (37.2 C)  98.5 F (36.9 C)   TempSrc: Oral  Oral   SpO2: 90% 93% 95% 93%  Weight:   77.8 kg   Height:   5\' 6"  (1.676 m)     Intake/Output Summary (Last 24 hours) at 09/28/2018 0533 Last data filed at 09/27/2018 1400 Gross per 24 hour  Intake 100 ml  Output -  Net 100 ml   Filed Weights   09/25/18 2153 09/27/18 1936  Weight: 74.4 kg 77.8 kg    Exam: Constitutional: Female who appears acutely ill, but able to follow commands Eyes: PERRL, lids and conjunctivae normal ENMT: Mucous membranes are moist. Posterior pharynx clear of any exudate or lesions.Normal dentition.  Neck: normal, supple, no masses, no thyromegaly Respiratory: Bilateral wheezes heard throughout both lung fields.  Intermittently has coughing spells.  Is able to speak in almost complete sentences.  On 2 L nasal cannula oxygen at this time. Cardiovascular: Regular rate and rhythm, no murmurs / rubs / gallops. No extremity edema. 2+ pedal pulses. No carotid bruits.  Abdomen: no tenderness, no masses palpated. No hepatosplenomegaly. Bowel sounds positive.  Musculoskeletal: no clubbing / cyanosis. No joint deformity upper and lower extremities. Good ROM, no contractures. Normal muscle tone.  Skin: no rashes,  lesions, ulcers. No induration Neurologic: CN 2-12 grossly intact. Sensation intact, DTR normal. Strength 5/5 in all 4.  Psychiatric: Normal judgment and insight. Alert and oriented x 3. Normal mood.    Data Reviewed:   I have personally reviewed following labs and imaging studies:  Labs: Labs show the following:   Basic Metabolic Panel: Recent Labs  Lab  09/26/18 0218  NA 137  K 4.3  CL 99  CO2 26  GLUCOSE 122*  BUN 11  CREATININE 0.68  CALCIUM 8.6*   GFR Estimated Creatinine Clearance: 80.7 mL/min (by C-G formula based on SCr of 0.68 mg/dL). Liver Function Tests: No results for input(s): AST, ALT, ALKPHOS, BILITOT, PROT, ALBUMIN in the last 168 hours. No results for input(s): LIPASE, AMYLASE in the last 168 hours. No results for input(s): AMMONIA in the last 168 hours. Coagulation profile No results for input(s): INR, PROTIME in the last 168 hours.  CBC: Recent Labs  Lab 09/26/18 0218  WBC 10.5  HGB 14.3  HCT 46.3*  MCV 89.6  PLT 343   Cardiac Enzymes: No results for input(s): CKTOTAL, CKMB, CKMBINDEX, TROPONINI in the last 168 hours. BNP (last 3 results) No results for input(s): PROBNP in the last 8760 hours. CBG: No results for input(s): GLUCAP in the last 168 hours. D-Dimer: No results for input(s): DDIMER in the last 72 hours. Hgb A1c: No results for input(s): HGBA1C in the last 72 hours. Lipid Profile: No results for input(s): CHOL, HDL, LDLCALC, TRIG, CHOLHDL, LDLDIRECT in the last 72 hours. Thyroid function studies: No results for input(s): TSH, T4TOTAL, T3FREE, THYROIDAB in the last 72 hours.  Invalid input(s): FREET3 Anemia work up: No results for input(s): VITAMINB12, FOLATE, FERRITIN, TIBC, IRON, RETICCTPCT in the last 72 hours. Sepsis Labs: Recent Labs  Lab 09/26/18 0218 09/26/18 0351 09/26/18 0944  WBC 10.5  --   --   LATICACIDVEN  --  1.1 1.7    Microbiology Recent Results (from the past 240 hour(s))  Blood culture (routine x 2)     Status: None (Preliminary result)   Collection Time: 09/26/18  3:40 AM  Result Value Ref Range Status   Specimen Description BLOOD LEFT ARM  Final   Special Requests   Final    BOTTLES DRAWN AEROBIC AND ANAEROBIC Blood Culture adequate volume   Culture   Final    NO GROWTH 1 DAY Performed at [facility], [address] [place], [place] [address]      Report Status PENDING  Incomplete  Blood culture (routine x 2)     Status: None (Preliminary result)   Collection Time: 09/26/18  3:50 AM  Result Value Ref Range Status   Specimen Description BLOOD RIGHT ARM  Final   Special Requests   Final    BOTTLES DRAWN AEROBIC AND ANAEROBIC Blood Culture adequate volume   Culture   Final    NO GROWTH 1 DAY Performed at [facility], [place] [address] [place], [place] [address]    Report Status PENDING  Incomplete  Culture, sputum-assessment     Status: None   Collection Time: 09/26/18  3:56 PM  Result Value Ref Range Status   Specimen Description EXPECTORATED SPUTUM  Final   Special Requests NONE  Final   Sputum evaluation   Final    THIS SPECIMEN IS ACCEPTABLE FOR SPUTUM CULTURE Performed at [facility], [address] [place], [place] [address]    Report Status 09/26/2018 FINAL  Final  Culture, respiratory     Status: None (Preliminary result)   Collection Time: 09/26/18  3:56 PM  Result Value Ref Range Status   Specimen Description EXPECTORATED SPUTUM  Final   Special Requests NONE Reflexed from [id number]  Final   Gram Stain   Final    ABUNDANT WBC PRESENT,BOTH PMN AND MONONUCLEAR FEW GRAM POSITIVE COCCI RARE GRAM VARIABLE ROD    Culture   Final    CULTURE REINCUBATED FOR BETTER GROWTH Performed at [facility], [place] [address], [place], [place] [address]    Report Status PENDING  Incomplete    Procedures and diagnostic studies:  No results found.  Medications:   . amoxicillin-clavulanate  1 tablet Oral Q12H  . aspirin  81 mg Oral Daily  . busPIRone  5 mg Oral BID  . enoxaparin (LOVENOX) injection  40 mg Subcutaneous Q24H  . hydrochlorothiazide  12.5 mg Oral Daily  . ipratropium-albuterol  3 mL Nebulization BID  . nicotine  14 mg Transdermal Daily  . oseltamivir  75 mg Oral BID  . PARoxetine  10 mg Oral BH-q7a  . predniSONE  40 mg Oral Q breakfast   Continuous Infusions:   LOS: 1 day   Rondell A  Smith  [facility]   *Please refer to [email or web address], password [email or web address] to get updated schedule on who will round on this patient, as hospitalists switch teams weekly. If 7PM-7AM, please contact night-coverage at [email or web address], password [email or web address] for any overnight needs.

## 2018-09-28 NOTE — Care Management Note (Addendum)
Case Management Note  Patient Details  Name: Vicki [name] MRN: [id number] Date of Birth: [birth year]  Subjective/Objective:     Influenza A/ COPD exacerbation.     Barbaraann Barthel (Sister) Ardyth Gal (Daughter)    [phone]     PCP: Sandi Mariscal  Action/Plan: Transition to home with oxygen. Portable tank will be delivered to bedside prior to d/c.  Expected Discharge Date:  09/28/18               Expected Discharge Plan:  Home/Self Care  In-House Referral:  NA  Discharge planning Services  CM Consult  Post Acute Care Choice:  NA Choice offered to:  Patient  DME Arranged:  Oxygen DME Agency:  [place] NA  HH Arranged:  NA HH Agency:  NA  Status of Service:  Completed, signed off  If discussed at [place] of Stay Meetings, dates discussed:    Additional Comments:  Sharin Mons, RN 09/28/2018, 9:34 AM

## 2018-10-01 LAB — CULTURE, BLOOD (ROUTINE X 2)
Culture: NO GROWTH
Culture: NO GROWTH
Special Requests: ADEQUATE
Special Requests: ADEQUATE

## 2019-01-29 ENCOUNTER — Other Ambulatory Visit: Payer: Self-pay | Admitting: Internal Medicine

## 2019-01-29 ENCOUNTER — Other Ambulatory Visit: Payer: Self-pay | Admitting: General Surgery

## 2019-01-29 DIAGNOSIS — Z1231 Encounter for screening mammogram for malignant neoplasm of breast: Secondary | ICD-10-CM

## 2019-03-15 ENCOUNTER — Ambulatory Visit
Admission: RE | Admit: 2019-03-15 | Discharge: 2019-03-15 | Disposition: A | Discharge status: Home / Self Care | Payer: Medicare HMO | Source: Ambulatory Visit | Attending: Internal Medicine | Admitting: Internal Medicine

## 2019-03-15 ENCOUNTER — Other Ambulatory Visit: Payer: Self-pay

## 2019-03-15 DIAGNOSIS — Z1231 Encounter for screening mammogram for malignant neoplasm of breast: Secondary | ICD-10-CM

## 2019-03-28 LAB — HM MAMMOGRAPHY

## 2019-05-01 ENCOUNTER — Other Ambulatory Visit: Payer: Self-pay

## 2019-05-01 DIAGNOSIS — Z20822 Contact with and (suspected) exposure to covid-19: Secondary | ICD-10-CM

## 2019-05-03 LAB — NOVEL CORONAVIRUS, NAA: SARS-CoV-2, NAA: NOT DETECTED

## 2019-05-16 ENCOUNTER — Other Ambulatory Visit: Payer: Self-pay

## 2019-05-16 ENCOUNTER — Encounter: Payer: Self-pay | Admitting: Family Medicine

## 2019-05-16 ENCOUNTER — Ambulatory Visit (INDEPENDENT_AMBULATORY_CARE_PROVIDER_SITE_OTHER): Payer: Medicare HMO | Admitting: Family Medicine

## 2019-05-16 VITALS — BP 110/70 | HR 98 | Temp 98.4°F | Resp 18 | Ht 66.0 in | Wt 180.0 lb

## 2019-05-16 DIAGNOSIS — E785 Hyperlipidemia, unspecified: Secondary | ICD-10-CM

## 2019-05-16 DIAGNOSIS — R5383 Other fatigue: Secondary | ICD-10-CM | POA: Diagnosis not present

## 2019-05-16 DIAGNOSIS — J449 Chronic obstructive pulmonary disease, unspecified: Secondary | ICD-10-CM | POA: Diagnosis not present

## 2019-05-16 DIAGNOSIS — R7303 Prediabetes: Secondary | ICD-10-CM

## 2019-05-16 DIAGNOSIS — E559 Vitamin D deficiency, unspecified: Secondary | ICD-10-CM

## 2019-05-16 DIAGNOSIS — R0981 Nasal congestion: Secondary | ICD-10-CM

## 2019-05-16 MED ORDER — HYDROXYZINE HCL 25 MG PO TABS: 25.0000 mg | ORAL_TABLET | Freq: Every day | ORAL | 1 refills | Status: DC

## 2019-05-16 MED ORDER — METFORMIN HCL 500 MG PO TABS: 500.0000 mg | ORAL_TABLET | Freq: Two times a day (BID) | ORAL | 1 refills | Status: DC

## 2019-05-16 MED ORDER — BUDESONIDE-FORMOTEROL FUMARATE 160-4.5 MCG/ACT IN AERO: 2.0000 | INHALATION_SPRAY | Freq: Two times a day (BID) | RESPIRATORY_TRACT | 3 refills | Status: DC

## 2019-05-16 MED ORDER — ATORVASTATIN CALCIUM 10 MG PO TABS: 10.0000 mg | ORAL_TABLET | Freq: Every day | ORAL | 3 refills | Status: DC

## 2019-05-16 NOTE — Patient Instructions (Addendum)
  Decrease caffeine slowly to 2 cups a day Increase water to 6 bottles a day  If you have lab work done today you will be contacted with your lab results within the next 2 weeks.  If you have not heard from [place] then please contact us. The fastest way to get your results is to register for My Chart.   IF you received an x-ray today, you will receive an invoice from [facility]. Please contact [facility] at [phone] with questions or concerns regarding your invoice.   IF you received labwork today, you will receive an invoice from [facility]. Please contact [facility] at [phone] with questions or concerns regarding your invoice.   Our billing staff will not be able to assist you with questions regarding bills from these companies.  You will be contacted with the lab results as soon as they are available. The fastest way to get your results is to activate your My Chart account. Instructions are located on the last page of this paperwork. If you have not heard from [place] regarding the results in 2 weeks, please contact this office.

## 2019-05-16 NOTE — Progress Notes (Signed)
10/8/20204:12 PM  Vicki [name] 20-Jun-1961, 58 y.o., female [id number]  Chief Complaint  Patient presents with  . Establish Care    needing to establish/find a primary care doctor  . phq-9    score of 5; not on any antidepressant medication    HPI:   Patient is a 58 y.o. female with past medical history significant for prediabetes, HLP, COPD, vitamin D deficiency, low back pain, colonic polyps, tobacco use who presents today to establish care  Previous PCP Dr Nancy Fetter Last OV several months ago Sees pulmonology thru [facility] Has been working on cutting back on smoking, down from 1ppd to 1/2 ppd Has tried patch, gum wellbutrin makes her "feel funny'  Her main concern is continued runny nose and congestion when she wakes up claritin and flonase not helping Sleeps with the fan on Started having problems when she moved into current house  She is also wondering why is so tires/sleepy all day She is sleeps about 7-8 hours a day, reports good quality sleep used to snore but not recently  Has been drinking 7-8 cups a day Drinks only 2 bottles of water a day  Has been off depression medications for 2 years, feels she is doing well  Declines flu vaccine  Depression screen [facility] 2/9 05/16/2019 02/05/2016 01/11/2016  Decreased Interest 0 0 0  Down, Depressed, Hopeless 3 0 0  PHQ - 2 Score 3 0 0  Altered sleeping 1 - -  Tired, decreased energy 1 - -  Change in appetite 0 - -  Feeling bad or failure about yourself  0 - -  Trouble concentrating 0 - -  Moving slowly or fidgety/restless 0 - -  Suicidal thoughts 0 - -  PHQ-9 Score 5 - -  Difficult doing work/chores Somewhat difficult - -    Fall Risk  05/16/2019  Falls in the past year? 0     No Known Allergies  Prior to Admission medications   Medication Sig Start Date End Date Taking? Authorizing Provider  atorvastatin (LIPITOR) 10 MG tablet Take 10 mg by mouth daily.   Yes [provider]  budesonide-formoterol  (SYMBICORT) 160-4.5 MCG/ACT inhaler Inhale 2 puffs into the lungs 2 (two) times daily.   Yes [provider]  metFORMIN (GLUCOPHAGE) 500 MG tablet Take 500 mg by mouth 2 (two) times daily with a meal.   Yes [provider]    Past Medical History:  Diagnosis Date  . Acquired hallux valgus of left foot   . Callous ulcer ([place])   . Depression with anxiety   . DEPRESSIVE DISORDER NOT ELSEWHERE CLASSIFIED   . Dyspnea   . Foot deformity, acquired   . Grief reaction   . Myalgia and myositis, unspecified   . Onychomycosis   . Oral thrush   . Rotator cuff syndrome of left shoulder   . Rotator cuff tear, right   . Shortness of breath   . SHOULDER PAIN, RIGHT   . Sleep disorder   . Urinary hesitancy     Past Surgical History:  Procedure Laterality Date  . cryotherphy  1986  . TUBAL LIGATION      Social History   Tobacco Use  . Smoking status: Current Every Day Smoker    Packs/day: 0.75    Types: Cigarettes  . Smokeless tobacco: Never Used  . Tobacco comment: increased smoking again due to stress  Substance Use Topics  . Alcohol use: No    Family History  Problem  Relation Age of Onset  . Heart disease Mother   . Diabetes Mother   . Cancer Mother        breast and ovarian  . Stroke Mother   . Breast cancer Mother        diagnosed in her 13's  . Heart disease Father     Review of Systems  Constitutional: Negative for chills, diaphoresis, fever and weight loss.  Respiratory: Positive for cough and shortness of breath. Negative for sputum production and wheezing.   Cardiovascular: Negative for chest pain, palpitations and leg swelling.  Gastrointestinal: Negative for abdominal pain, blood in stool, constipation, diarrhea, melena, nausea and vomiting.  Genitourinary: Negative for dysuria, frequency, hematuria and urgency.  Neurological: Negative for dizziness and headaches.  Endo/Heme/Allergies: Negative for polydipsia.  per hpi   OBJECTIVE:   Today's Vitals   05/16/19 1554  BP: 110/70  Pulse: 98  Resp: 18  Temp: 98.4 F (36.9 C)  TempSrc: Oral  SpO2: 92%  Weight: 180 lb (81.6 kg)  Height: '5\' 6"'  (1.676 m)   Body mass index is 29.05 kg/m.   Physical Exam Vitals signs and nursing note reviewed.  Constitutional:      Appearance: She is well-developed.  HENT:     Head: Normocephalic and atraumatic.     Mouth/Throat:     Pharynx: No oropharyngeal exudate.  Eyes:     General: No scleral icterus.    Conjunctiva/sclera: Conjunctivae normal.     Pupils: Pupils are equal, round, and reactive to light.  Neck:     Musculoskeletal: Neck supple.     Vascular: No carotid bruit.  Cardiovascular:     Rate and Rhythm: Normal rate and regular rhythm.     Heart sounds: Normal heart sounds. No murmur. No friction rub. No gallop.   Pulmonary:     Effort: Pulmonary effort is normal.     Breath sounds: Normal breath sounds. No wheezing, rhonchi or rales.  Abdominal:     General: Bowel sounds are normal. There is no distension.     Palpations: Abdomen is soft. There is no mass.     Tenderness: There is no abdominal tenderness.  Musculoskeletal:     Right lower leg: No edema.     Left lower leg: No edema.  Skin:    General: Skin is warm and dry.  Neurological:     General: No focal deficit present.     Mental Status: She is alert and oriented to person, place, and time.     Gait: Gait normal.     Deep Tendon Reflexes: Reflexes normal.  Psychiatric:        Mood and Affect: Mood normal.        Behavior: Behavior normal.     No results found for this or any previous visit (from the past 24 hour(s)).  No results found.   ASSESSMENT and PLAN  1. Prediabetes Checking labs today, meds to be adjusted as needed. cont with LFM - Hemoglobin A1c  2. Hyperlipidemia, unspecified hyperlipidemia type Checking labs today, medications will be adjusted as needed.  - Lipid panel - CMP14+EGFR  3. Obstructive chronic bronchitis  without exacerbation (HCC) Stable. Managed by pulm  4. Fatigue, unspecified type Decrease caffeine, push fluids Consider fatigue/osa - TSH - CBC  5. Vitamin D deficiency - Vitamin D, 25-hydroxy  6. Nasal congestion Trial of vistaril at bedtime Reviewed med r/se/b  PMH, PSH, meds, allergies, Fhx, Shx, reviewed with patient today.  Other orders -  atorvastatin (LIPITOR) 10 MG tablet; Take 1 tablet (10 mg total) by mouth daily. - budesonide-formoterol (SYMBICORT) 160-4.5 MCG/ACT inhaler; Inhale 2 puffs into the lungs 2 (two) times daily. - metFORMIN (GLUCOPHAGE) 500 MG tablet; Take 1 tablet (500 mg total) by mouth 2 (two) times daily with a meal. - hydrOXYzine (ATARAX/VISTARIL) 25 MG tablet; Take 1 tablet (25 mg total) by mouth at bedtime.  Return in about 3 months (around 08/16/2019).    Rutherford Guys, MD Primary Care at [place], [place] [address] Ph.  [phone] Fax [phone]

## 2019-05-17 LAB — TSH: TSH: 1.56 u[IU]/mL (ref 0.450–4.500)

## 2019-05-17 LAB — LIPID PANEL
Chol/HDL Ratio: 4 ratio (ref 0.0–4.4)
Cholesterol, Total: 187 mg/dL (ref 100–199)
HDL: 47 mg/dL (ref >39)
LDL Chol Calc (NIH): 100 mg/dL — ABNORMAL HIGH (ref 0–99)
Triglycerides: 237 mg/dL — ABNORMAL HIGH (ref 0–149)
VLDL Cholesterol Cal: 40 mg/dL (ref 5–40)

## 2019-05-17 LAB — CMP14+EGFR
ALT: 23 IU/L (ref 0–32)
AST: 18 IU/L (ref 0–40)
Albumin/Globulin Ratio: 1.8 (ref 1.2–2.2)
Albumin: 4.4 g/dL (ref 3.8–4.9)
Alkaline Phosphatase: 94 IU/L (ref 39–117)
BUN/Creatinine Ratio: 15 (ref 9–23)
BUN: 11 mg/dL (ref 6–24)
Bilirubin Total: <0.2 mg/dL (ref 0.0–1.2)
CO2: 26 mmol/L (ref 20–29)
Calcium: 9.4 mg/dL (ref 8.7–10.2)
Chloride: 102 mmol/L (ref 96–106)
Creatinine, Ser: 0.72 mg/dL (ref 0.57–1.00)
GFR calc Af Amer: 106 mL/min/{1.73_m2} (ref >59)
GFR calc non Af Amer: 92 mL/min/{1.73_m2} (ref >59)
Globulin, Total: 2.4 g/dL (ref 1.5–4.5)
Glucose: 114 mg/dL — ABNORMAL HIGH (ref 65–99)
Potassium: 4 mmol/L (ref 3.5–5.2)
Sodium: 141 mmol/L (ref 134–144)
Total Protein: 6.8 g/dL (ref 6.0–8.5)

## 2019-05-17 LAB — CBC
Hematocrit: 39.8 % (ref 34.0–46.6)
Hemoglobin: 13.5 g/dL (ref 11.1–15.9)
MCH: 28.7 pg (ref 26.6–33.0)
MCHC: 33.9 g/dL (ref 31.5–35.7)
MCV: 85 fL (ref 79–97)
Platelets: 390 x10E3/uL (ref 150–450)
RBC: 4.7 x10E6/uL (ref 3.77–5.28)
RDW: 12.8 % (ref 11.7–15.4)
WBC: 8.9 x10E3/uL (ref 3.4–10.8)

## 2019-05-17 LAB — HEMOGLOBIN A1C
Est. average glucose Bld gHb Est-mCnc: 134 mg/dL
Hgb A1c MFr Bld: 6.3 % — ABNORMAL HIGH (ref 4.8–5.6)

## 2019-05-17 LAB — VITAMIN D 25 HYDROXY (VIT D DEFICIENCY, FRACTURES): Vit D, 25-Hydroxy: 57 ng/mL (ref 30.0–100.0)

## 2019-05-28 ENCOUNTER — Ambulatory Visit: Payer: Self-pay

## 2019-05-28 NOTE — Telephone Encounter (Signed)
Pt. Reports she has taken the Atarax and states it made her "very dizzy. When I got up during the night to go to the bathroom, I was very dizzy." States she doesn't want to take anymore. Does the doctor want her to take something else. Answer Assessment - Initial Assessment Questions 1.   NAME of MEDICATION: "What medicine are you calling about?"     Atarax 2.   QUESTION: "What is your question?"     N/A 3.   PRESCRIBING HCP: "Who prescribed it?" Reason: if prescribed by specialist, call should be referred to that group.     Dr. Pamella Pert 4. SYMPTOMS: "Do you have any symptoms?"     The medicine makes me very dizzy. 5. SEVERITY: If symptoms are present, ask "Are they mild, moderate or severe?"     Moderate 6.  PREGNANCY:  "Is there any chance that you are pregnant?" "When was your last menstrual period?"     No  Protocols used: MEDICATION QUESTION CALL-A-AH

## 2019-05-30 NOTE — Telephone Encounter (Signed)
Please have her stop hydroxyzine, this was given for nasal congestion, she can try another antihistamine such as zyrtec or allegra. She can also try oral decongestant such as pseudo phenylephrine (that is what is found in sudafed products sold on the aisle). thanks

## 2019-06-05 ENCOUNTER — Encounter: Payer: Self-pay | Admitting: *Deleted

## 2019-06-05 NOTE — Telephone Encounter (Signed)
Mychart message sent.

## 2019-08-16 ENCOUNTER — Other Ambulatory Visit: Payer: Self-pay

## 2019-08-16 ENCOUNTER — Ambulatory Visit (INDEPENDENT_AMBULATORY_CARE_PROVIDER_SITE_OTHER): Payer: Medicare HMO | Admitting: Family Medicine

## 2019-08-16 ENCOUNTER — Encounter: Payer: Self-pay | Admitting: Family Medicine

## 2019-08-16 ENCOUNTER — Telehealth: Payer: Self-pay

## 2019-08-16 VITALS — BP 116/82 | HR 100 | Temp 98.3°F | Ht 66.0 in | Wt 178.4 lb

## 2019-08-16 DIAGNOSIS — F411 Generalized anxiety disorder: Secondary | ICD-10-CM

## 2019-08-16 DIAGNOSIS — E785 Hyperlipidemia, unspecified: Secondary | ICD-10-CM | POA: Diagnosis not present

## 2019-08-16 DIAGNOSIS — R7303 Prediabetes: Secondary | ICD-10-CM

## 2019-08-16 MED ORDER — CITALOPRAM HYDROBROMIDE 10 MG PO TABS: 10.0000 mg | ORAL_TABLET | Freq: Every day | ORAL | 2 refills | Status: DC

## 2019-08-16 NOTE — Progress Notes (Signed)
1/8/202110:35 AM  Vicki [name] 24-Sep-1960, 59 y.o., female [id number]  Chief Complaint  Patient presents with  . Hyperlipidemia  . health maintenance    pt will amke appt for pap and do conent for cologuard results from [place]    HPI:   Patient is a 59 y.o. female with past medical history significant for prediabetes, HLP, COPD, vitamin D deficiency, low back pain, colonic polyps, tobacco use  who presents today for routine followup  Last OV Oct 2020  Trial of vistaril Labs were normal Having increased anxiety of recent having panic attacks Used to be on buspar and paxil - which did not help much has not tried anything else  Lab Results  Component Value Date   HGBA1C 6.3 (H) 05/16/2019   HGBA1C 6.2 01/11/2016   HGBA1C 6.2 10/28/2014   Lab Results  Component Value Date   LDLCALC 100 (H) 05/16/2019   CREATININE 0.72 05/16/2019    Depression screen PHQ 2/9 08/16/2019 05/16/2019 02/05/2016  Decreased Interest 0 0 0  Down, Depressed, Hopeless 0 3 0  PHQ - 2 Score 0 3 0  Altered sleeping - 1 -  Tired, decreased energy - 1 -  Change in appetite - 0 -  Feeling bad or failure about yourself  - 0 -  Trouble concentrating - 0 -  Moving slowly or fidgety/restless - 0 -  Suicidal thoughts - 0 -  PHQ-9 Score - 5 -  Difficult doing work/chores - Somewhat difficult -    Fall Risk  08/16/2019 05/16/2019  Falls in the past year? 0 0  Number falls in past yr: 0 -  Injury with Fall? 0 -     No Known Allergies  Prior to Admission medications   Medication Sig Start Date End Date Taking? Authorizing Provider  albuterol (VENTOLIN HFA) 108 (90 Base) MCG/ACT inhaler  08/06/19  Yes [provider]  atorvastatin (LIPITOR) 10 MG tablet Take 1 tablet (10 mg total) by mouth daily. 05/16/19  Yes Rutherford Guys, MD  budesonide-formoterol [facility]) 160-4.5 MCG/ACT inhaler Inhale 2 puffs into the lungs 2 (two) times daily. 05/16/19  Yes Rutherford Guys, MD  hydrOXYzine  (ATARAX/VISTARIL) 25 MG tablet Take 1 tablet (25 mg total) by mouth at bedtime. 05/16/19  Yes Rutherford Guys, MD  metFORMIN (GLUCOPHAGE) 500 MG tablet Take 1 tablet (500 mg total) by mouth 2 (two) times daily with a meal. 05/16/19  Yes Rutherford Guys, MD    Past Medical History:  Diagnosis Date  . Acquired hallux valgus of left foot   . Callous ulcer ([place])   . Depression with anxiety   . DEPRESSIVE DISORDER NOT ELSEWHERE CLASSIFIED   . Dyspnea   . Foot deformity, acquired   . Grief reaction   . Myalgia and myositis, unspecified   . Onychomycosis   . Oral thrush   . Rotator cuff syndrome of left shoulder   . Rotator cuff tear, right   . Shortness of breath   . SHOULDER PAIN, RIGHT   . Sleep disorder   . Urinary hesitancy     Past Surgical History:  Procedure Laterality Date  . cryotherphy  1986  . TUBAL LIGATION      Social History   Tobacco Use  . Smoking status: Current Every Day Smoker    Packs/day: 0.75    Types: Cigarettes  . Smokeless tobacco: Never Used  . Tobacco comment: increased smoking again due to stress  Substance Use Topics  .  Alcohol use: No    Family History  Problem Relation Age of Onset  . Heart disease Mother   . Diabetes Mother   . Cancer Mother        breast and ovarian  . Stroke Mother   . Breast cancer Mother        diagnosed in her 2's  . Heart disease Father     Review of Systems  Constitutional: Negative for chills and fever.  Respiratory: Negative for cough and shortness of breath.   Cardiovascular: Negative for chest pain, palpitations and leg swelling.  Gastrointestinal: Negative for abdominal pain, nausea and vomiting.     OBJECTIVE:  Today's Vitals   08/16/19 1030  BP: 116/82  Pulse: 100  Temp: 98.3 F (36.8 C)  SpO2: 93%  Weight: 178 lb 6.4 oz (80.9 kg)  Height: 5\' 6"  (1.676 m)   Body mass index is 28.79 kg/m.   Physical Exam Vitals and nursing note reviewed.  Constitutional:      Appearance: She is  well-developed.  HENT:     Head: Normocephalic and atraumatic.     Mouth/Throat:     Pharynx: No oropharyngeal exudate.  Eyes:     General: No scleral icterus.    Conjunctiva/sclera: Conjunctivae normal.     Pupils: Pupils are equal, round, and reactive to light.  Cardiovascular:     Rate and Rhythm: Normal rate and regular rhythm.     Heart sounds: Normal heart sounds. No murmur. No friction rub. No gallop.   Pulmonary:     Effort: Pulmonary effort is normal.     Breath sounds: Normal breath sounds. No wheezing or rales.  Musculoskeletal:     Cervical back: Neck supple.  Skin:    General: Skin is warm and dry.  Neurological:     Mental Status: She is alert and oriented to person, place, and time.     No results found for this or any previous visit (from the past 24 hour(s)).  No results found.   ASSESSMENT and PLAN  1. GAD (generalized anxiety disorder) Worsening. Coping skills not being as effective anymore. Restarting meds. Reviewed r/se/b.   2. Prediabetes Stable. Discussed importance of LFM and a1c goal.  3. Hyperlipidemia, unspecified hyperlipidemia type Stable. Discussed importance of LFM.  Other orders - albuterol (VENTOLIN HFA) 108 (90 Base) MCG/ACT inhaler - citalopram (CELEXA) 10 MG tablet; Take 1 tablet (10 mg total) by mouth daily.  Return in about 4 weeks (around 09/13/2019) for anxiety.    Rutherford Guys, MD Primary Care at [place], [place] [address] Ph.  [phone] Fax [phone]

## 2019-08-16 NOTE — Telephone Encounter (Signed)
Consent forms faxed for pap and cologuard sent to [facility] physician

## 2019-08-16 NOTE — Patient Instructions (Addendum)
I recommend a healthy diet, regular exercise and healthy weight.  Diet low in fried, fatty, processed foods and red meat   Diet high in vegetables, fruits, poultry, salmon, nuts (almonds, peanuts, walnuts), seeds (sunflower, pumpkin and sesame), fiber, olive oil, flax seed, oat bran and brown rice.    Prediabetes Eating Plan Prediabetes is a condition that causes blood sugar (glucose) levels to be higher than normal. This increases the risk for developing diabetes. In order to prevent diabetes from developing, your health care provider may recommend a diet and other lifestyle changes to help you:  Control your blood glucose levels.  Improve your cholesterol levels.  Manage your blood pressure. Your health care provider may recommend working with a diet and nutrition specialist (dietitian) to make a meal plan that is best for you. What are tips for following this plan? Lifestyle  Set weight loss goals with the help of your health care team. It is recommended that most people with prediabetes lose 7% of their current body weight.  Exercise for at least 30 minutes at least 5 days a week.  Attend a support group or seek ongoing support from a mental health counselor.  Take over-the-counter and prescription medicines only as told by your health care provider. Reading food labels  Read food labels to check the amount of fat, salt (sodium), and sugar in prepackaged foods. Avoid foods that have: ? Saturated fats. ? Trans fats. ? Added sugars.  Avoid foods that have more than 300 milligrams (mg) of sodium per serving. Limit your daily sodium intake to less than 2,300 mg each day. Shopping  Avoid buying pre-made and processed foods. Cooking  Cook with olive oil. Do not use butter, lard, or ghee.  Bake, broil, grill, or boil foods. Avoid frying. Meal planning   Work with your dietitian to develop an eating plan that is right for you. This may include: ? Tracking how many calories you  take in. Use a food diary, notebook, or mobile application to track what you eat at each meal. ? Using the glycemic index (GI) to plan your meals. The index tells you how quickly a food will raise your blood glucose. Choose low-GI foods. These foods take a longer time to raise blood glucose.  Consider following a Mediterranean diet. This diet includes: ? Several servings each day of fresh fruits and vegetables. ? Eating fish at least twice a week. ? Several servings each day of whole grains, beans, nuts, and seeds. ? Using olive oil instead of other fats. ? Moderate alcohol consumption. ? Eating small amounts of red meat and whole-fat dairy.  If you have high blood pressure, you may need to limit your sodium intake or follow a diet such as the DASH eating plan. DASH is an eating plan that aims to lower high blood pressure. What foods are recommended? The items listed below may not be a complete list. Talk with your dietitian about what dietary choices are best for you. Grains Whole grains, such as whole-wheat or whole-grain breads, crackers, cereals, and pasta. Unsweetened oatmeal. Bulgur. Barley. Quinoa. Brown rice. Corn or whole-wheat flour tortillas or taco shells. Vegetables Lettuce. Spinach. Peas. Beets. Cauliflower. Cabbage. Broccoli. Carrots. Tomatoes. Squash. Eggplant. Herbs. Peppers. Onions. Cucumbers. Brussels sprouts. Fruits Berries. Bananas. Apples. Oranges. Grapes. Papaya. Mango. Pomegranate. Kiwi. Grapefruit. Cherries. Meats and other protein foods Seafood. Poultry without skin. Lean cuts of pork and beef. Tofu. Eggs. Nuts. Beans. Dairy Low-fat or fat-free dairy products, such as yogurt, cottage cheese, and  cheese. Beverages Water. Tea. Coffee. Sugar-free or diet soda. Seltzer water. Lowfat or no-fat milk. Milk alternatives, such as soy or almond milk. Fats and oils Olive oil. Canola oil. Sunflower oil. Grapeseed oil. Avocado. Walnuts. Sweets and desserts Sugar-free or  low-fat pudding. Sugar-free or low-fat ice cream and other frozen treats. Seasoning and other foods Herbs. Sodium-free spices. Mustard. Relish. Low-fat, low-sugar ketchup. Low-fat, low-sugar barbecue sauce. Low-fat or fat-free mayonnaise. What foods are not recommended? The items listed below may not be a complete list. Talk with your dietitian about what dietary choices are best for you. Grains Refined white flour and flour products, such as bread, pasta, snack foods, and cereals. Vegetables Canned vegetables. Frozen vegetables with butter or cream sauce. Fruits Fruits canned with syrup. Meats and other protein foods Fatty cuts of meat. Poultry with skin. Breaded or fried meat. Processed meats. Dairy Full-fat yogurt, cheese, or milk. Beverages Sweetened drinks, such as sweet iced tea and soda. Fats and oils Butter. Lard. Ghee. Sweets and desserts Baked goods, such as cake, cupcakes, pastries, cookies, and cheesecake. Seasoning and other foods Spice mixes with added salt. Ketchup. Barbecue sauce. Mayonnaise. Summary  To prevent diabetes from developing, you may need to make diet and other lifestyle changes to help control blood sugar, improve cholesterol levels, and manage your blood pressure.  Set weight loss goals with the help of your health care team. It is recommended that most people with prediabetes lose 7 percent of their current body weight.  Consider following a Mediterranean diet that includes plenty of fresh fruits and vegetables, whole grains, beans, nuts, seeds, fish, lean meat, low-fat dairy, and healthy oils. This information is not intended to replace advice given to you by your health care provider. Make sure you discuss any questions you have with your health care provider. Document Revised: 11/16/2018 Document Reviewed: 09/28/2016 Elsevier Patient Education  [employer].    If you have lab work done today you will be contacted with your lab results  within the next 2 weeks.  If you have not heard from [place] then please contact us. The fastest way to get your results is to register for My Chart.   IF you received an x-ray today, you will receive an invoice from [facility]. Please contact [facility] at [phone] with questions or concerns regarding your invoice.   IF you received labwork today, you will receive an invoice from [facility] [facility]. Please contact [facility] at [phone] with questions or concerns regarding your invoice.   Our billing staff will not be able to assist you with questions regarding bills from these companies.  You will be contacted with the lab results as soon as they are available. The fastest way to get your results is to activate your My Chart account. Instructions are located on the last page of this paperwork. If you have not heard from [place] regarding the results in 2 weeks, please contact this office.

## 2019-09-13 ENCOUNTER — Encounter: Payer: Self-pay | Admitting: Family Medicine

## 2019-09-13 ENCOUNTER — Other Ambulatory Visit: Payer: Self-pay

## 2019-09-13 ENCOUNTER — Ambulatory Visit (INDEPENDENT_AMBULATORY_CARE_PROVIDER_SITE_OTHER): Payer: Medicare HMO | Admitting: Family Medicine

## 2019-09-13 VITALS — BP 137/82 | HR 94 | Temp 98.4°F | Ht 66.0 in | Wt 173.0 lb

## 2019-09-13 DIAGNOSIS — F411 Generalized anxiety disorder: Secondary | ICD-10-CM | POA: Diagnosis not present

## 2019-09-13 MED ORDER — ESCITALOPRAM OXALATE 10 MG PO TABS: 10.0000 mg | ORAL_TABLET | Freq: Every day | ORAL | 2 refills | Status: DC

## 2019-09-13 NOTE — Progress Notes (Signed)
2/5/20211:25 PM  Vicki [name] 1960-08-29, 59 y.o., female [id number]  Chief Complaint  Patient presents with  . Anxiety    med is not really orking for nerves but helps her sleep great. Not taking the hydroxyzine, made her dizzy    HPI:   Patient is a 59 y.o. female with past medical history significant for  prediabetes, HLP, COPD, vitamin D deficiency, low back pain, colonic polyps, tobacco use who presents today for routine followup  Last OV a month ago Started celexa 10mg  daily - she is tolerating well, sleeping well, providing partial relief on anxiety but causing palpitations about an hour after she takes it, associated with mild SOB She does not read  She sees pulm thru [facility]  She is working on losing [employer] Readings from Last 3 Encounters:  09/13/19 173 lb (78.5 kg)  08/16/19 178 lb 6.4 oz (80.9 kg)  05/16/19 180 lb (81.6 kg)     Depression screen [facility] 2/9 09/13/2019 08/16/2019 05/16/2019  Decreased Interest 0 0 0  Down, Depressed, Hopeless 0 0 3  PHQ - 2 Score 0 0 3  Altered sleeping - - 1  Tired, decreased energy - - 1  Change in appetite - - 0  Feeling bad or failure about yourself  - - 0  Trouble concentrating - - 0  Moving slowly or fidgety/restless - - 0  Suicidal thoughts - - 0  PHQ-9 Score - - 5  Difficult doing work/chores - - Somewhat difficult   GAD 7 : Generalized Anxiety Score 09/13/2019  Nervous, Anxious, on Edge 3  Control/stop worrying 2  Worry too much - different things 3  Trouble relaxing 1  Restless 0  Easily annoyed or irritable 1  Afraid - awful might happen 3  Total GAD 7 Score 13  Anxiety Difficulty Not difficult at all     Fall Risk  09/13/2019 08/16/2019 05/16/2019  Falls in the past year? 0 0 0  Number falls in past yr: 0 0 -  Injury with Fall? 0 0 -     No Known Allergies  Prior to Admission medications   Medication Sig Start Date End Date Taking? Authorizing Provider  albuterol (VENTOLIN HFA) 108 (90 Base)  MCG/ACT inhaler  08/06/19  Yes [provider]  atorvastatin (LIPITOR) 10 MG tablet Take 1 tablet (10 mg total) by mouth daily. 05/16/19  Yes Rutherford Guys, MD  citalopram (CELEXA) 10 MG tablet Take 1 tablet (10 mg total) by mouth daily. 08/16/19  Yes Rutherford Guys, MD  metFORMIN (GLUCOPHAGE) 500 MG tablet Take 1 tablet (500 mg total) by mouth 2 (two) times daily with a meal. 05/16/19  Yes Rutherford Guys, MD  hydrOXYzine (ATARAX/VISTARIL) 25 MG tablet Take 1 tablet (25 mg total) by mouth at bedtime. Patient not taking: Reported on 09/13/2019 05/16/19   Rutherford Guys, MD    Past Medical History:  Diagnosis Date  . Acquired hallux valgus of left foot   . Callous ulcer ([place])   . Depression with anxiety   . DEPRESSIVE DISORDER NOT ELSEWHERE CLASSIFIED   . Dyspnea   . Foot deformity, acquired   . Grief reaction   . Myalgia and myositis, unspecified   . Onychomycosis   . Oral thrush   . Rotator cuff syndrome of left shoulder   . Rotator cuff tear, right   . Shortness of breath   . SHOULDER PAIN, RIGHT   . Sleep disorder   . Urinary  hesitancy     Past Surgical History:  Procedure Laterality Date  . cryotherphy  1986  . TUBAL LIGATION      Social History   Tobacco Use  . Smoking status: Current Every Day Smoker    Packs/day: 0.75    Types: Cigarettes  . Smokeless tobacco: Never Used  . Tobacco comment: increased smoking again due to stress  Substance Use Topics  . Alcohol use: No    Family History  Problem Relation Age of Onset  . Heart disease Mother   . Diabetes Mother   . Cancer Mother        breast and ovarian  . Stroke Mother   . Breast cancer Mother        diagnosed in her 11's  . Heart disease Father     ROS Per hpi  OBJECTIVE:  Today's Vitals   09/13/19 1321  BP: 137/82  Pulse: 94  Temp: 98.4 F (36.9 C)  SpO2: 95%  Weight: 173 lb (78.5 kg)  Height: 5\' 6"  (1.676 m)   Body mass index is 27.92 kg/m.   Physical Exam Vitals and  nursing note reviewed.  Constitutional:      Appearance: She is well-developed.  HENT:     Head: Normocephalic and atraumatic.     Mouth/Throat:     Pharynx: No oropharyngeal exudate.  Eyes:     General: No scleral icterus.    Conjunctiva/sclera: Conjunctivae normal.     Pupils: Pupils are equal, round, and reactive to light.  Cardiovascular:     Rate and Rhythm: Normal rate and regular rhythm.     Heart sounds: Normal heart sounds. No murmur. No friction rub. No gallop.   Pulmonary:     Effort: Pulmonary effort is normal.     Breath sounds: Normal breath sounds. No wheezing or rales.  Musculoskeletal:     Cervical back: Neck supple.  Lymphadenopathy:     Cervical: No cervical adenopathy.  Skin:    General: Skin is warm and dry.  Neurological:     Mental Status: She is alert and oriented to person, place, and time.     No results found for this or any previous visit (from the past 24 hour(s)).  No results found.   ASSESSMENT and PLAN  1. GAD (generalized anxiety disorder) Anxiety improving but unfortunately having side effects with celexa. Changing to lexapro. Reviewed r/se/b.   Other orders - escitalopram (LEXAPRO) 10 MG tablet; Take 1 tablet (10 mg total) by mouth at bedtime.  Return in about 4 weeks (around 10/11/2019).    Rutherford Guys, MD Primary Care at [place], [place] [address] Ph.  [phone] Fax [phone]

## 2019-09-13 NOTE — Patient Instructions (Signed)
° ° ° °  If you have lab work done today you will be contacted with your lab results within the next 2 weeks.  If you have not heard from us then please contact us. The fastest way to get your results is to register for My Chart. ° ° °IF you received an x-ray today, you will receive an invoice from [facility]. Please contact  Radiology at [phone] with questions or concerns regarding your invoice.  ° °IF you received labwork today, you will receive an invoice from [facility]. Please contact [facility] at [phone] with questions or concerns regarding your invoice.  ° °Our billing staff will not be able to assist you with questions regarding bills from these companies. ° °You will be contacted with the lab results as soon as they are available. The fastest way to get your results is to activate your My Chart account. Instructions are located on the last page of this paperwork. If you have not heard from us regarding the results in 2 weeks, please contact this office. °  ° ° ° °

## 2019-09-24 ENCOUNTER — Other Ambulatory Visit: Payer: Self-pay | Admitting: Family Medicine

## 2019-09-25 NOTE — Telephone Encounter (Signed)
Requested medication (s) are due for refill today: yes  Requested medication (s) are on the active medication list: no  Last refill:  ?  Future visit scheduled: yes  Notes to clinic:  not on active med list   Requested Prescriptions  Pending Prescriptions Disp Refills   hydrOXYzine (ATARAX/VISTARIL) 25 MG tablet [Pharmacy Med Name: HYDROXYZINE HYDROCHLORIDE 25 MG Tablet] 90 tablet     Sig: TAKE 1 TABLET AT BEDTIME      Ear, Nose, and Throat:  Antihistamines Passed - 09/24/2019 10:00 PM      Passed - Valid encounter within last 12 months    Recent Outpatient Visits           1 week ago GAD (generalized anxiety disorder)   [facility], Lilia Argue, MD   1 month ago GAD (generalized anxiety disorder)   [facility], Lilia Argue, MD   4 months ago Prediabetes   [facility], Lilia Argue, MD       Future Appointments             In 2 weeks Rutherford Guys, MD Primary Care at [place], [facility]

## 2019-10-02 NOTE — Telephone Encounter (Signed)
hydrOXYzine (ATARAX/VISTARIL) 25 MG tablet [id number] DISCONTINUED  Discontinued by: Rutherford Guys, MD on 09/13/2019 13:26  Pt will have to come to office for medication.   Mychart message sent. Pt has an upcoming appt on 10/11/19 @ 11 AM.

## 2019-10-11 ENCOUNTER — Other Ambulatory Visit: Payer: Self-pay

## 2019-10-11 ENCOUNTER — Ambulatory Visit (INDEPENDENT_AMBULATORY_CARE_PROVIDER_SITE_OTHER): Payer: Medicare HMO | Admitting: Family Medicine

## 2019-10-11 ENCOUNTER — Encounter: Payer: Self-pay | Admitting: Family Medicine

## 2019-10-11 VITALS — BP 139/81 | HR 84 | Temp 98.7°F | Ht 66.0 in | Wt 169.0 lb

## 2019-10-11 DIAGNOSIS — F411 Generalized anxiety disorder: Secondary | ICD-10-CM

## 2019-10-11 DIAGNOSIS — R7303 Prediabetes: Secondary | ICD-10-CM

## 2019-10-11 MED ORDER — METFORMIN HCL ER (OSM) 1000 MG PO TB24: 1000.0000 mg | ORAL_TABLET | Freq: Every day | ORAL | 2 refills | Status: DC

## 2019-10-11 MED ORDER — VENLAFAXINE HCL ER 37.5 MG PO CP24: 37.5000 mg | ORAL_CAPSULE | Freq: Every day | ORAL | 3 refills | Status: DC

## 2019-10-11 NOTE — Patient Instructions (Signed)
° ° ° °  If you have lab work done today you will be contacted with your lab results within the next 2 weeks.  If you have not heard from us then please contact us. The fastest way to get your results is to register for My Chart. ° ° °IF you received an x-ray today, you will receive an invoice from [facility]. Please contact [facility] at [phone] with questions or concerns regarding your invoice.  ° °IF you received labwork today, you will receive an invoice from [facility]. Please contact [facility] at [phone] with questions or concerns regarding your invoice.  ° °Our billing staff will not be able to assist you with questions regarding bills from these companies. ° °You will be contacted with the lab results as soon as they are available. The fastest way to get your results is to activate your My Chart account. Instructions are located on the last page of this paperwork. If you have not heard from us regarding the results in 2 weeks, please contact this office. °  ° ° ° °

## 2019-10-11 NOTE — Progress Notes (Signed)
3/5/202111:05 AM  Vicki [name] 1961/05/27, 59 y.o., female [id number]  Chief Complaint  Patient presents with  . Anxiety    pt is wanting to discuss another anxiety med, feels that the med is amaking her sleepy along with the other meds she's on for her chronic conditions. Pt is still taking all the meds    HPI:   Patient is a 59 y.o. female with past medical history significant for prediabetes, HLP, COPD, vitamin D deficiency, low back pain, colonic polyps, tobacco usewho presents today for followup on anxiety  Last oV a month ago, changed celexa to lexapro due to side effects Taking at bedtime but feels sleepy all day long, helps with anxiety, GAD 7 today 3 Patient reports that she used to snore but not anymore She has also cut back on metformin due to side effects  Wt Readings from Last 3 Encounters:  10/11/19 169 lb (76.7 kg)  09/13/19 173 lb (78.5 kg)  08/16/19 178 lb 6.4 oz (80.9 kg)     Depression screen [facility] 2/9 09/13/2019 08/16/2019 05/16/2019  Decreased Interest 0 0 0  Down, Depressed, Hopeless 0 0 3  PHQ - 2 Score 0 0 3  Altered sleeping - - 1  Tired, decreased energy - - 1  Change in appetite - - 0  Feeling bad or failure about yourself  - - 0  Trouble concentrating - - 0  Moving slowly or fidgety/restless - - 0  Suicidal thoughts - - 0  PHQ-9 Score - - 5  Difficult doing work/chores - - Somewhat difficult    Fall Risk  10/11/2019 09/13/2019 08/16/2019 05/16/2019  Falls in the past year? 0 0 0 0  Number falls in past yr: 0 0 0 -  Injury with Fall? 0 0 0 -     No Known Allergies  Prior to Admission medications   Medication Sig Start Date End Date Taking? Authorizing Provider  albuterol (VENTOLIN HFA) 108 (90 Base) MCG/ACT inhaler  08/06/19  Yes [provider]  atorvastatin (LIPITOR) 10 MG tablet Take 1 tablet (10 mg total) by mouth daily. 05/16/19  Yes Rutherford Guys, MD  escitalopram (LEXAPRO) 10 MG tablet Take 1 tablet (10 mg total) by mouth  at bedtime. 09/13/19  Yes Rutherford Guys, MD  metFORMIN (GLUCOPHAGE) 500 MG tablet Take 1 tablet (500 mg total) by mouth 2 (two) times daily with a meal. 05/16/19  Yes Rutherford Guys, MD    Past Medical History:  Diagnosis Date  . Acquired hallux valgus of left foot   . Callous ulcer ([place])   . Depression with anxiety   . DEPRESSIVE DISORDER NOT ELSEWHERE CLASSIFIED   . Dyspnea   . Foot deformity, acquired   . Grief reaction   . Myalgia and myositis, unspecified   . Onychomycosis   . Oral thrush   . Rotator cuff syndrome of left shoulder   . Rotator cuff tear, right   . Shortness of breath   . SHOULDER PAIN, RIGHT   . Sleep disorder   . Urinary hesitancy     Past Surgical History:  Procedure Laterality Date  . cryotherphy  1986  . TUBAL LIGATION      Social History   Tobacco Use  . Smoking status: Current Every Day Smoker    Packs/day: 0.75    Types: Cigarettes  . Smokeless tobacco: Never Used  . Tobacco comment: increased smoking again due to stress  Substance Use Topics  . Alcohol  use: No    Family History  Problem Relation Age of Onset  . Heart disease Mother   . Diabetes Mother   . Cancer Mother        breast and ovarian  . Stroke Mother   . Breast cancer Mother        diagnosed in her 90's  . Heart disease Father     Review of Systems  Constitutional: Negative for chills and fever.  Respiratory: Negative for cough and shortness of breath.   Cardiovascular: Negative for chest pain, palpitations and leg swelling.  Gastrointestinal: Negative for abdominal pain, nausea and vomiting.  per hpi   OBJECTIVE:  Today's Vitals   10/11/19 1100  BP: 139/81  Pulse: 84  Temp: 98.7 F (37.1 C)  SpO2: 91%  Weight: 169 lb (76.7 kg)  Height: 5\' 6"  (1.676 m)   Body mass index is 27.28 kg/m.   Physical Exam Vitals and nursing note reviewed.  Constitutional:      Appearance: She is well-developed.  HENT:     Head: Normocephalic and atraumatic.  Eyes:      General: No scleral icterus.    Conjunctiva/sclera: Conjunctivae normal.     Pupils: Pupils are equal, round, and reactive to light.  Pulmonary:     Effort: Pulmonary effort is normal.  Musculoskeletal:     Cervical back: Neck supple.  Skin:    General: Skin is warm and dry.  Neurological:     Mental Status: She is alert and oriented to person, place, and time.     No results found for this or any previous visit (from the past 24 hour(s)).  No results found.   ASSESSMENT and PLAN  1. GAD (generalized anxiety disorder) Intolerant of celexa and lexapro, will do trial of effexor, reviewed r/se/b  2. Prediabetes Intolerant of metformin IR, changing to XR, cont with LFM  Other orders - venlafaxine XR (EFFEXOR XR) 37.5 MG 24 hr capsule; Take 1 capsule (37.5 mg total) by mouth daily with breakfast. - metformin (FORTAMET) 1000 MG (OSM) 24 hr tablet; Take 1 tablet (1,000 mg total) by mouth daily with breakfast.  Return in about 2 months (around 12/11/2019).    Rutherford Guys, MD Primary Care at [place], [place] [address] Ph.  [phone] Fax [phone]

## 2019-10-24 ENCOUNTER — Telehealth: Payer: Self-pay | Admitting: Family Medicine

## 2019-10-24 ENCOUNTER — Telehealth: Payer: Self-pay | Admitting: *Deleted

## 2019-10-24 NOTE — Telephone Encounter (Signed)
No action needed

## 2019-10-24 NOTE — Telephone Encounter (Signed)
metformin (FORTAMET) 1000 MG (OSM) 24 hr tablet    Patient states she would like to back to 500 mg metformin. She has already spoken with [facility] and was advised to contact PCP.

## 2019-10-24 NOTE — Telephone Encounter (Signed)
Key [id number]  Metformin er

## 2019-10-31 ENCOUNTER — Other Ambulatory Visit: Payer: Self-pay

## 2019-10-31 DIAGNOSIS — R7303 Prediabetes: Secondary | ICD-10-CM

## 2019-10-31 MED ORDER — METFORMIN HCL 500 MG PO TABS: 500.0000 mg | ORAL_TABLET | Freq: Two times a day (BID) | ORAL | 3 refills | Status: DC

## 2019-11-06 ENCOUNTER — Other Ambulatory Visit: Payer: Self-pay | Admitting: Family Medicine

## 2019-12-13 ENCOUNTER — Ambulatory Visit (INDEPENDENT_AMBULATORY_CARE_PROVIDER_SITE_OTHER): Payer: Medicare HMO | Admitting: Family Medicine

## 2019-12-13 ENCOUNTER — Other Ambulatory Visit: Payer: Self-pay

## 2019-12-13 ENCOUNTER — Encounter: Payer: Self-pay | Admitting: Family Medicine

## 2019-12-13 VITALS — BP 140/83 | HR 90 | Temp 98.7°F | Ht 66.0 in | Wt 171.0 lb

## 2019-12-13 DIAGNOSIS — M25511 Pain in right shoulder: Secondary | ICD-10-CM

## 2019-12-13 DIAGNOSIS — R7303 Prediabetes: Secondary | ICD-10-CM | POA: Diagnosis not present

## 2019-12-13 DIAGNOSIS — E785 Hyperlipidemia, unspecified: Secondary | ICD-10-CM

## 2019-12-13 DIAGNOSIS — G8929 Other chronic pain: Secondary | ICD-10-CM

## 2019-12-13 DIAGNOSIS — M1711 Unilateral primary osteoarthritis, right knee: Secondary | ICD-10-CM

## 2019-12-13 DIAGNOSIS — F411 Generalized anxiety disorder: Secondary | ICD-10-CM

## 2019-12-13 DIAGNOSIS — G4719 Other hypersomnia: Secondary | ICD-10-CM

## 2019-12-13 DIAGNOSIS — R030 Elevated blood-pressure reading, without diagnosis of hypertension: Secondary | ICD-10-CM

## 2019-12-13 MED ORDER — MELOXICAM 7.5 MG PO TABS: 7.5000 mg | ORAL_TABLET | Freq: Every evening | ORAL | 2 refills | Status: DC | PRN

## 2019-12-13 MED ORDER — ESCITALOPRAM OXALATE 10 MG PO TABS: 10.0000 mg | ORAL_TABLET | Freq: Every day | ORAL | 1 refills | Status: DC

## 2019-12-13 NOTE — Progress Notes (Signed)
5/7/202110:43 AM  Vicki [name] Dec 20, 1960, 59 y.o., female [id number]  Chief Complaint  Patient presents with  . Follow-up    A1c/BP    HPI:   Patient is a 59 y.o. female with past medical history significant for HLP, COPD, vitamin D deficiency, low back pain, colonic polyps, tobacco usewho presents today for routine followup  Last OV macrh 2021 - changed metformin to XR, changed to effexor  Patient reports that her insurance did not cover her metformin XR, she has been taking metformin IR twice a day She wants to get back on lexapro - she has also gained weight as has been stress eating Took effexor for a month - still made her sleepy She reports that she has had daytime excessive sleepiness for years, she snores intermittently, gets 7-8 hours of sleep, nocturia x 2, wakes up feeling tired  Patient reports known Right knee OA and right shoulder rotator cuff pain has been worsening Has been taking tylenol - not helping She cont to smoke, not ready for quiting  She sees pulm  Wt Readings from Last 3 Encounters:  12/13/19 171 lb (77.6 kg)  10/11/19 169 lb (76.7 kg)  09/13/19 173 lb (78.5 kg)    Lab Results  Component Value Date   HGBA1C 6.3 (H) 05/16/2019   HGBA1C 6.2 01/11/2016   HGBA1C 6.2 10/28/2014   Lab Results  Component Value Date   LDLCALC 100 (H) 05/16/2019   CREATININE 0.72 05/16/2019    Depression screen PHQ 2/9 12/13/2019 09/13/2019 08/16/2019  Decreased Interest 0 0 0  Down, Depressed, Hopeless 0 0 0  PHQ - 2 Score 0 0 0  Altered sleeping - - -  Tired, decreased energy - - -  Change in appetite - - -  Feeling bad or failure about yourself  - - -  Trouble concentrating - - -  Moving slowly or fidgety/restless - - -  Suicidal thoughts - - -  PHQ-9 Score - - -  Difficult doing work/chores - - -    Fall Risk  12/13/2019 10/11/2019 09/13/2019 08/16/2019 05/16/2019  Falls in the past year? 0 0 0 0 0  Number falls in past yr: 0 0 0 0 -  Injury with  Fall? 0 0 0 0 -  Follow up Falls evaluation completed - - - -     No Known Allergies  Prior to Admission medications   Medication Sig Start Date End Date Taking? Authorizing Provider  albuterol (VENTOLIN HFA) 108 (90 Base) MCG/ACT inhaler  08/06/19  Yes [provider]  atorvastatin (LIPITOR) 10 MG tablet Take 1 tablet (10 mg total) by mouth daily. 05/16/19  Yes Rutherford Guys, MD  metFORMIN (GLUCOPHAGE) 500 MG tablet Take 1 tablet (500 mg total) by mouth 2 (two) times daily with a meal. 10/31/19  Yes Rutherford Guys, MD  venlafaxine XR (EFFEXOR XR) 37.5 MG 24 hr capsule Take 1 capsule (37.5 mg total) by mouth daily with breakfast. 10/11/19  Yes Rutherford Guys, MD    Past Medical History:  Diagnosis Date  . Acquired hallux valgus of left foot   . Callous ulcer ([place])   . Depression with anxiety   . DEPRESSIVE DISORDER NOT ELSEWHERE CLASSIFIED   . Dyspnea   . Foot deformity, acquired   . Grief reaction   . Myalgia and myositis, unspecified   . Onychomycosis   . Oral thrush   . Rotator cuff syndrome of left shoulder   . Rotator cuff  tear, right   . Shortness of breath   . SHOULDER PAIN, RIGHT   . Sleep disorder   . Urinary hesitancy     Past Surgical History:  Procedure Laterality Date  . cryotherphy  1986  . TUBAL LIGATION      Social History   Tobacco Use  . Smoking status: Current Every Day Smoker    Packs/day: 0.75    Types: Cigarettes  . Smokeless tobacco: Never Used  . Tobacco comment: increased smoking again due to stress  Substance Use Topics  . Alcohol use: No    Family History  Problem Relation Age of Onset  . Heart disease Mother   . Diabetes Mother   . Cancer Mother        breast and ovarian  . Stroke Mother   . Breast cancer Mother        diagnosed in her 76's  . Heart disease Father     Review of Systems  Constitutional: Negative for chills and fever.  Respiratory: Negative for cough and shortness of breath.   Cardiovascular:  Negative for chest pain, palpitations and leg swelling.  Gastrointestinal: Negative for abdominal pain, nausea and vomiting.     OBJECTIVE:  Today's Vitals   12/13/19 1026 12/13/19 1027 12/13/19 1116  BP: (!) 153/94 (!) 148/84 140/83  Pulse: 90    Temp: 98.7 F (37.1 C)    TempSrc: Temporal    SpO2: 94%    Weight: 171 lb (77.6 kg)    Height: _0  (1.676 m)     Body mass index is 27.6 kg/m.   BP Readings from Last 3 Encounters:  12/13/19 (!) 148/84  10/11/19 139/81  09/13/19 137/82     Physical Exam Vitals and nursing note reviewed.  Constitutional:      Appearance: She is well-developed.  HENT:     Head: Normocephalic and atraumatic.     Mouth/Throat:     Pharynx: No oropharyngeal exudate.  Eyes:     General: No scleral icterus.    Conjunctiva/sclera: Conjunctivae normal.     Pupils: Pupils are equal, round, and reactive to light.  Cardiovascular:     Rate and Rhythm: Normal rate and regular rhythm.     Heart sounds: Normal heart sounds. No murmur. No friction rub. No gallop.   Pulmonary:     Effort: Pulmonary effort is normal.     Breath sounds: Normal breath sounds. No wheezing or rales.  Musculoskeletal:     Cervical back: Neck supple.  Skin:    General: Skin is warm and dry.  Neurological:     Mental Status: She is alert and oriented to person, place, and time.     No results found for this or any previous visit (from the past 24 hour(s)).  No results found.   ASSESSMENT and PLAN  1. GAD (generalized anxiety disorder) Not controlled. Changing back to lexapro per patient request. Reviewed r/se/b  2. Prediabetes Labs pending. Cont metformin. Cont working on [employer], discussed stress eating - CMP14+EGFR - Hemoglobin A1c  3. Hyperlipidemia, unspecified hyperlipidemia type Checking labs today, medications will be adjusted as needed.  - Lipid panel  4. Excessive daytime sleepiness - Ambulatory referral to Sleep Studies  5. Chronic right shoulder  pain - Ambulatory referral to Orthopedic Surgery  6. Primary osteoarthritis of right knee - Ambulatory referral to Orthopedic Surgery  7. Elevated BP without diagnosis of hypertension Coming down with recheck, discussed DASH diet. Cont to monitor.  Other orders -  escitalopram (LEXAPRO) 10 MG tablet; Take 1 tablet (10 mg total) by mouth at bedtime. - meloxicam (MOBIC) 7.5 MG tablet; Take 1 tablet (7.5 mg total) by mouth at bedtime as needed for pain.  Return in about 3 months (around 03/14/2020).    Rutherford Guys, MD [facility] [place], [place] [address] Ph.  [phone] Fax [phone]

## 2019-12-13 NOTE — Patient Instructions (Signed)
° ° ° °  If you have lab work done today you will be contacted with your lab results within the next 2 weeks.  If you have not heard from us then please contact us. The fastest way to get your results is to register for My Chart. ° ° °IF you received an x-ray today, you will receive an invoice from [facility]. Please contact [facility] at [phone] with questions or concerns regarding your invoice.  ° °IF you received labwork today, you will receive an invoice from [facility]. Please contact [facility] at [phone] with questions or concerns regarding your invoice.  ° °Our billing staff will not be able to assist you with questions regarding bills from these companies. ° °You will be contacted with the lab results as soon as they are available. The fastest way to get your results is to activate your My Chart account. Instructions are located on the last page of this paperwork. If you have not heard from us regarding the results in 2 weeks, please contact this office. °  ° ° ° °

## 2019-12-14 LAB — CMP14+EGFR
ALT: 13 IU/L (ref 0–32)
AST: 13 IU/L (ref 0–40)
Albumin/Globulin Ratio: 1.8 (ref 1.2–2.2)
Albumin: 4.4 g/dL (ref 3.8–4.9)
Alkaline Phosphatase: 92 IU/L (ref 39–117)
BUN/Creatinine Ratio: 27 (ref 12–28)
BUN: 16 mg/dL (ref 8–27)
Bilirubin Total: <0.2 mg/dL (ref 0.0–1.2)
CO2: 25 mmol/L (ref 20–29)
Calcium: 9.6 mg/dL (ref 8.7–10.3)
Chloride: 99 mmol/L (ref 96–106)
Creatinine, Ser: 0.59 mg/dL (ref 0.57–1.00)
GFR calc Af Amer: 115 mL/min/{1.73_m2} (ref >59)
GFR calc non Af Amer: 100 mL/min/{1.73_m2} (ref >59)
Globulin, Total: 2.4 g/dL (ref 1.5–4.5)
Glucose: 131 mg/dL — ABNORMAL HIGH (ref 65–99)
Potassium: 4.5 mmol/L (ref 3.5–5.2)
Sodium: 137 mmol/L (ref 134–144)
Total Protein: 6.8 g/dL (ref 6.0–8.5)

## 2019-12-14 LAB — LIPID PANEL
Chol/HDL Ratio: 3.9 ratio (ref 0.0–4.4)
Cholesterol, Total: 172 mg/dL (ref 100–199)
HDL: 44 mg/dL (ref >39)
LDL Chol Calc (NIH): 106 mg/dL — ABNORMAL HIGH (ref 0–99)
Triglycerides: 124 mg/dL (ref 0–149)
VLDL Cholesterol Cal: 22 mg/dL (ref 5–40)

## 2019-12-14 LAB — HEMOGLOBIN A1C
Est. average glucose Bld gHb Est-mCnc: 134 mg/dL
Hgb A1c MFr Bld: 6.3 % — ABNORMAL HIGH (ref 4.8–5.6)

## 2019-12-25 ENCOUNTER — Other Ambulatory Visit: Payer: Self-pay

## 2019-12-25 ENCOUNTER — Ambulatory Visit (INDEPENDENT_AMBULATORY_CARE_PROVIDER_SITE_OTHER): Payer: Medicare HMO

## 2019-12-25 ENCOUNTER — Ambulatory Visit (INDEPENDENT_AMBULATORY_CARE_PROVIDER_SITE_OTHER): Payer: Medicare HMO | Admitting: Orthopaedic Surgery

## 2019-12-25 ENCOUNTER — Encounter: Payer: Self-pay | Admitting: Orthopaedic Surgery

## 2019-12-25 DIAGNOSIS — M25511 Pain in right shoulder: Secondary | ICD-10-CM | POA: Diagnosis not present

## 2019-12-25 DIAGNOSIS — G8929 Other chronic pain: Secondary | ICD-10-CM

## 2019-12-25 DIAGNOSIS — M25561 Pain in right knee: Secondary | ICD-10-CM | POA: Diagnosis not present

## 2019-12-25 MED ORDER — BUPIVACAINE HCL 0.5 % IJ SOLN
2.0000 mL | INTRAMUSCULAR | Status: AC | PRN
  Administered 2019-12-25: 2 mL via INTRA_ARTICULAR

## 2019-12-25 MED ORDER — METHYLPREDNISOLONE ACETATE 40 MG/ML IJ SUSP
40.0000 mg | INTRAMUSCULAR | Status: AC | PRN
  Administered 2019-12-25: 40 mg via INTRA_ARTICULAR

## 2019-12-25 MED ORDER — LIDOCAINE HCL 1 % IJ SOLN
3.0000 mL | INTRAMUSCULAR | Status: AC | PRN
  Administered 2019-12-25: 3 mL

## 2019-12-25 MED ORDER — BUPIVACAINE HCL 0.5 % IJ SOLN
3.0000 mL | INTRAMUSCULAR | Status: AC | PRN
  Administered 2019-12-25: 3 mL via INTRA_ARTICULAR

## 2019-12-25 MED ORDER — LIDOCAINE HCL 1 % IJ SOLN
2.0000 mL | INTRAMUSCULAR | Status: AC | PRN
  Administered 2019-12-25: 2 mL

## 2019-12-25 NOTE — Progress Notes (Signed)
Office Visit Note   Patient: Vicki [name]           Date of Birth: 26-Dec-1960           MRN: [id number] Visit Date: 12/25/2019              Requested by: Rutherford Guys, MD [address] [place],  [place] [address] PCP: Rutherford Guys, MD   Assessment & Plan: Visit Diagnoses:  1. Chronic pain of right knee   2. Chronic right shoulder pain     Plan: Impression is right shoulder pain and right knee OA.  I feel that the shoulder is likely due to chronic rotator cuff insufficiency.  I have recommended a subacromial injection with home exercises and resistance bands as an initial treatment.  If this fails to provide her with relief an MRI would be next step.  In terms of the right knee we injected this with cortisone today.  She tolerated this well.  Activity as tolerated.  Questions encouraged and answered.  Follow-Up Instructions: Return if symptoms worsen or fail to improve.   Orders:  Orders Placed This Encounter  Procedures  . XR KNEE 3 VIEW RIGHT  . XR Shoulder Right   No orders of the defined types were placed in this encounter.     Procedures: Large Joint Inj: R knee on 12/25/2019 6:22 PM Indications: pain Details: 22 G needle  Arthrogram: No  Medications: 40 mg methylPREDNISolone acetate 40 MG/ML; 2 mL lidocaine 1 %; 2 mL bupivacaine 0.5 % Consent was given by the patient. Patient was prepped and draped in the usual sterile fashion.   Large Joint Inj: R subacromial bursa on 12/25/2019 6:22 PM Indications: pain Details: 22 G needle  Arthrogram: No  Medications: 3 mL lidocaine 1 %; 3 mL bupivacaine 0.5 %; 40 mg methylPREDNISolone acetate 40 MG/ML Outcome: tolerated well, no immediate complications Consent was given by the patient. Patient was prepped and draped in the usual sterile fashion.       Clinical Data: No additional findings.   Subjective: Chief Complaint  Patient presents with  . Right Shoulder - Pain  . Right Knee - Pain    Vicki [name]  is a 59 year old female comes in for evaluation of right shoulder and right knee pain.  For the shoulder she has pain with raising her arm and she is right-hand dominant.  She had an MRI back in 2011 which I reviewed which showed a full-thickness retracted tear of the supraspinatus with muscle atrophy.  She also had atrophy of the infraspinatus with tendinosis.  She denies any numbness or tingling or radicular symptoms.  She is not clear as to why she did not undergo rotator cuff repair at that time.  In terms of the right knee she feels like there is pain all over without any swelling.  Denies any mechanical symptoms.  It feels like a crampy sensation.  She does take meloxicam without significant relief.   Review of Systems  Constitutional: Negative.   HENT: Negative.   Eyes: Negative.   Respiratory: Negative.   Cardiovascular: Negative.   Endocrine: Negative.   Musculoskeletal: Negative.   Neurological: Negative.   Hematological: Negative.   Psychiatric/Behavioral: Negative.   All other systems reviewed and are negative.    Objective: Vital Signs: There were no vitals taken for this visit.  Physical Exam Vitals and nursing note reviewed.  Constitutional:      Appearance: She is well-developed.  HENT:  Head: Normocephalic and atraumatic.  Pulmonary:     Effort: Pulmonary effort is normal.  Abdominal:     Palpations: Abdomen is soft.  Musculoskeletal:     Cervical back: Neck supple.  Skin:    General: Skin is warm.     Capillary Refill: Capillary refill takes less than 2 seconds.  Neurological:     Mental Status: She is alert and oriented to person, place, and time.  Psychiatric:        Behavior: Behavior normal.        Thought Content: Thought content normal.        Judgment: Judgment normal.     Ortho Exam Right shoulder shows nearly full range of motion with moderate pain.  Pain with empty can testing.  No shrug compensation.  Pain with Speed test.  Bicipital  groove is slightly tender.  AC joint is nontender.  Right knee shows no effusion.  Normal range of motion with mild pain.  Collaterals and cruciates are stable. Specialty Comments:  No specialty comments available.  Imaging: XR KNEE 3 VIEW RIGHT  Result Date: 12/25/2019 Mild OA  XR Shoulder Right  Result Date: 12/25/2019 Degenerative changes of the [facility] joint.  Glenohumeral joint appears to be mildly arthritic.    PMFS History: Patient Active Problem List   Diagnosis Date Noted  . Influenza A 09/26/2018  . COPD with acute exacerbation ([place]) 09/26/2018  . Neck pain 02/05/2016  . Leg swelling 01/19/2016  . Dry cough 02/11/2015  . Fatigue 02/11/2015  . Prediabetes 10/29/2014  . Polyuria 10/28/2014  . Hyperglycemia 10/28/2014  . Left breast mass 04/11/2014  . Right hip pain 03/21/2014  . Edema, lower extremity 03/21/2014  . Low back pain 12/17/2013  . Right knee pain 12/17/2013  . Chest pain, central 11/05/2013  . Lateral epicondylitis of left elbow 11/05/2013  . Obstructive chronic bronchitis without exacerbation ([place]) 08/23/2013  . Wrist fracture, right 07/08/2013  . Sciatica 05/28/2013  . Grief reaction 04/02/2013  . Myalgia and myositis, unspecified 03/07/2012  . Rotator cuff syndrome of left shoulder 03/07/2012  . Depression with anxiety 03/07/2012  . Foot deformity, acquired 06/21/2011  . Callous ulcer ([place]) 06/21/2011  . Acquired hallux valgus of left foot 05/10/2011  . Sleep disorder 02/04/2011  . Rotator cuff tear, right 09/03/2010  . SHOULDER PAIN, RIGHT 08/20/2010  . TOBACCO ABUSE 06/11/2010  . DEPRESSIVE DISORDER NOT ELSEWHERE CLASSIFIED 06/11/2010   Past Medical History:  Diagnosis Date  . Acquired hallux valgus of left foot   . Callous ulcer ([place])   . Depression with anxiety   . DEPRESSIVE DISORDER NOT ELSEWHERE CLASSIFIED   . Dyspnea   . Foot deformity, acquired   . Grief reaction   . Myalgia and myositis, unspecified   . Onychomycosis   . Oral  thrush   . Rotator cuff syndrome of left shoulder   . Rotator cuff tear, right   . Shortness of breath   . SHOULDER PAIN, RIGHT   . Sleep disorder   . Urinary hesitancy     Family History  Problem Relation Age of Onset  . Heart disease Mother   . Diabetes Mother   . Cancer Mother        breast and ovarian  . Stroke Mother   . Breast cancer Mother        diagnosed in her 8's  . Heart disease Father     Past Surgical History:  Procedure Laterality Date  . cryotherphy  1986  .  TUBAL LIGATION     Social History   Occupational History  . Not on file  Tobacco Use  . Smoking status: Current Every Day Smoker    Packs/day: 0.75    Types: Cigarettes  . Smokeless tobacco: Never Used  . Tobacco comment: increased smoking again due to stress  Substance and Sexual Activity  . Alcohol use: No  . Drug use: No  . Sexual activity: Never    Birth control/protection: Surgical, Post-menopausal

## 2020-01-02 ENCOUNTER — Ambulatory Visit (INDEPENDENT_AMBULATORY_CARE_PROVIDER_SITE_OTHER): Payer: Medicare HMO | Admitting: Neurology

## 2020-01-02 ENCOUNTER — Other Ambulatory Visit: Payer: Self-pay

## 2020-01-02 ENCOUNTER — Encounter: Payer: Self-pay | Admitting: Neurology

## 2020-01-02 VITALS — BP 132/82 | HR 92 | Ht 66.0 in | Wt 170.0 lb

## 2020-01-02 DIAGNOSIS — R0683 Snoring: Secondary | ICD-10-CM | POA: Diagnosis not present

## 2020-01-02 DIAGNOSIS — R351 Nocturia: Secondary | ICD-10-CM

## 2020-01-02 DIAGNOSIS — F17200 Nicotine dependence, unspecified, uncomplicated: Secondary | ICD-10-CM | POA: Diagnosis not present

## 2020-01-02 DIAGNOSIS — E663 Overweight: Secondary | ICD-10-CM | POA: Diagnosis not present

## 2020-01-02 DIAGNOSIS — G4719 Other hypersomnia: Secondary | ICD-10-CM

## 2020-01-02 DIAGNOSIS — R519 Headache, unspecified: Secondary | ICD-10-CM

## 2020-01-02 DIAGNOSIS — J449 Chronic obstructive pulmonary disease, unspecified: Secondary | ICD-10-CM

## 2020-01-02 NOTE — Progress Notes (Signed)
Subjective:    Patient ID: Vicki [name] is a 59 y.o. female.  HPI     Star Age, MD, PhD [facility] [address], [address] [place], [place] [address]  Dear Dr. Pamella Pert,  I saw your patient, Vicki [name], upon your kind request in my sleep clinic today for initial consultation of her sleep disorder, in particular, concern for underlying obstructive sleep apnea.  The patient is unaccompanied today.  As you know, Vicki [name] is a 59 year old right-handed woman with an underlying medical history of COPD, vitamin D deficiency, hyperlipidemia, chronic back pain, anxiety, depression, and overweight state, who reports snoring and excessive daytime somnolence.  I reviewed your office note from 12/13/2019.  Her Epworth sleepiness score is 13 out of 24, fatigue severity score is 55 out of 63.  He does not wake up rested, she is tired during the day, she has had occasional morning headaches.  She is not aware of any family history of OSA.  She smokes about 16 or 17 cigarettes/day and is working on smoking cessation, was previously able to reduce to 11 cigarettes/day.  She has reduced her caffeine intake, drinks about 2 cups of coffee in the mornings.  She lives with a friend, she has 3 grown children.  She has nocturia about twice per average night.  She has a bedtime of around 10.  She may sleep until 10:30 AM.  She does not currently work.  She has trouble going to sleep and sometimes takes an over-the-counter sleep aid.  She has discomfort in the right knee.  Her Past Medical History Is Significant For: Past Medical History:  Diagnosis Date  . Acquired hallux valgus of left foot   . Callous ulcer ([place])   . Depression with anxiety   . DEPRESSIVE DISORDER NOT ELSEWHERE CLASSIFIED   . Dyspnea   . Foot deformity, acquired   . Grief reaction   . Myalgia and myositis, unspecified   . Onychomycosis   . Oral thrush   . Rotator cuff syndrome of left shoulder   .  Rotator cuff tear, right   . Shortness of breath   . SHOULDER PAIN, RIGHT   . Sleep disorder   . Urinary hesitancy     Her Past Surgical History Is Significant For: Past Surgical History:  Procedure Laterality Date  . cryotherphy  1986  . TUBAL LIGATION      Her Family History Is Significant For: Family History  Problem Relation Age of Onset  . Heart disease Mother   . Diabetes Mother   . Cancer Mother        breast and ovarian  . Stroke Mother   . Breast cancer Mother        diagnosed in her 39's  . Heart disease Father     Her Social History Is Significant For: Social History   Socioeconomic History  . Marital status: Widowed    Spouse name: Not on file  . Number of children: Not on file  . Years of education: Not on file  . Highest education level: Not on file  Occupational History  . Not on file  Tobacco Use  . Smoking status: Current Every Day Smoker    Packs/day: 0.75    Types: Cigarettes  . Smokeless tobacco: Never Used  . Tobacco comment: increased smoking again due to stress  Substance and Sexual Activity  . Alcohol use: No  . Drug use: No  . Sexual activity: Never  Birth control/protection: Surgical, Post-menopausal  Other Topics Concern  . Not on file  Social History Narrative  . Not on file   Social Determinants of Health   Financial Resource Strain:   . Difficulty of Paying Living Expenses:   Food Insecurity:   . Worried About Charity fundraiser in the Last Year:   . Arboriculturist in the Last Year:   Transportation Needs:   . Film/video editor (Medical):   Marland Kitchen Lack of Transportation (Non-Medical):   Physical Activity:   . Days of Exercise per Week:   . Minutes of Exercise per Session:   Stress:   . Feeling of Stress :   Social Connections:   . Frequency of Communication with Friends and Family:   . Frequency of Social Gatherings with Friends and Family:   . Attends Religious Services:   . Active Member of Clubs or  Organizations:   . Attends Archivist Meetings:   Marland Kitchen Marital Status:     Her Allergies Are:  Allergies  Allergen Reactions  . Codeine Nausea And Vomiting  :   Her Current Medications Are:  Outpatient Encounter Medications as of 01/02/2020  Medication Sig  . albuterol (VENTOLIN HFA) 108 (90 Base) MCG/ACT inhaler   . ASPIRIN 81 PO Take by mouth.  Marland Kitchen atorvastatin (LIPITOR) 10 MG tablet Take 1 tablet (10 mg total) by mouth daily.  Marland Kitchen escitalopram (LEXAPRO) 10 MG tablet Take 1 tablet (10 mg total) by mouth at bedtime.  . meloxicam (MOBIC) 7.5 MG tablet Take 1 tablet (7.5 mg total) by mouth at bedtime as needed for pain.  . metFORMIN (GLUCOPHAGE) 500 MG tablet Take 1 tablet (500 mg total) by mouth 2 (two) times daily with a meal.   No facility-administered encounter medications on file as of 01/02/2020.  :  Review of Systems:  Out of a complete 14 point review of systems, all are reviewed and negative with the exception of these symptoms as listed below: Review of Systems  Neurological:       Here for sleep consult. No prior sleep study. Pt reports she does snore.  Epworth Sleepiness Scale 0= would never doze 1= slight chance of dozing 2= moderate chance of dozing 3= high chance of dozing  Sitting and reading:1 Watching TV:3 Sitting inactive in a public place (ex. Theater or meeting):1 As a passenger in a car for an hour without a break:0 Lying down to rest in the afternoon:3 Sitting and talking to someone:2 Sitting quietly after lunch (no alcohol):3 In a car, while stopped in traffic:0 Total:13     Objective:  Neurological Exam  Physical Exam Physical Examination:   Vitals:   01/02/20 1128  BP: 132/82  Pulse: 92  SpO2: 95%    General Examination: The patient is a very pleasant 59 y.o. female in no acute distress. She appears well-developed and well-nourished and well groomed.   HEENT: Normocephalic, atraumatic, pupils are equal, round and reactive to  light, extraocular tracking is good without limitation to gaze excursion or nystagmus noted. Hearing is grossly intact. Face is symmetric with normal facial animation. Speech is clear with no dysarthria noted. There is no hypophonia. There is no lip, neck/head, jaw or voice tremor. Neck is supple with full range of passive and active motion. There are no carotid bruits on auscultation. Oropharynx exam reveals: moderate to severe mouth dryness, adequate dental hygiene with dentures on the top and edentulous on the bottom, mild airway crowding secondary to  small airway entry, tonsils of about 1+, tongue protrudes centrally and palate elevates symmetrically, neck circumference of 15-1/4 inches.    Chest: Clear to auscultation without wheezing, rhonchi or crackles noted.  Heart: S1+S2+0, regular and normal without murmurs, rubs or gallops noted.   Abdomen: Soft, non-tender and non-distended with normal bowel sounds appreciated on auscultation.  Extremities: There is no pitting edema in the distal lower extremities bilaterally.   Skin: Warm and dry without trophic changes noted.   Musculoskeletal: exam reveals some right knee swelling noted, mild valgus deformity of the right knee.   Neurologically:  Mental status: The patient is awake, alert and oriented in all 4 spheres. Her immediate and remote memory, attention, language skills and fund of knowledge are appropriate. There is no evidence of aphasia, agnosia, apraxia or anomia. Speech is clear with normal prosody and enunciation. Thought process is linear. Mood is normal and affect is normal.  Cranial nerves II - XII are as described above under HEENT exam.  Motor exam: Normal bulk, strength and tone is noted. There is no tremor, fine motor skills and coordination: grossly intact.  Cerebellar testing: No dysmetria or intention tremor. There is no truncal or gait ataxia.  Sensory exam: intact to light touch in the upper and lower extremities.  Gait,  station and balance: She stands easily. No veering to one side is noted. No leaning to one side is noted. Posture is age-appropriate and stance is narrow based. Gait shows normal stride length and normal pace, mild limp on the right.  Assessment and Plan:  In summary, Vicki [name] is a very pleasant 59 y.o.-year old female  with an underlying medical history of COPD, vitamin D deficiency, hyperlipidemia, chronic back pain, anxiety, depression, and overweight state, whose history and physical exam are concerning for obstructive sleep apnea (OSA). I had a long chat with the patient about my findings and the diagnosis of OSA, its prognosis and treatment options. We talked about medical treatments, surgical interventions and non-pharmacological approaches. I explained in particular the risks and ramifications of untreated moderate to severe OSA, especially with respect to developing cardiovascular disease down the Road, including congestive heart failure, difficult to treat hypertension, cardiac arrhythmias, or stroke. Even type 2 diabetes has, in part, been linked to untreated OSA. Symptoms of untreated OSA include daytime sleepiness, memory problems, mood irritability and mood disorder such as depression and anxiety, lack of energy, as well as recurrent headaches, especially morning headaches. We talked about smoking cessation and trying to maintain a healthy lifestyle in general, as well as the importance of weight control. We also talked about the importance of good sleep hygiene. I recommended the following at this time: sleep study.   I explained the sleep test procedure to the patient and also outlined possible surgical and non-surgical treatment options of OSA, including the use of a custom-made dental device (which would require a referral to a specialist dentist or oral surgeon), upper airway surgical options, such as traditional UPPP or a novel less invasive surgical option in the form of Inspire  hypoglossal nerve stimulation (which would involve a referral to an ENT surgeon). I also explained the CPAP treatment option to the patient, who indicated that she would be willing to try CPAP if the need arises. I explained the importance of being compliant with PAP treatment, not only for insurance purposes but primarily to improve Her symptoms, and for the patient's long term health benefit, including to reduce Her cardiovascular risks. I answered  all her questions today and the patient was in agreement. I plan to see her back after the sleep study is completed and encouraged her to call with any interim questions, concerns, problems or updates.   Thank you very much for allowing me to participate in the care of this nice patient. If I can be of any further assistance to you please do not hesitate to call me at [phone].  Sincerely,   Star Age, MD, PhD

## 2020-01-02 NOTE — Patient Instructions (Signed)

## 2020-01-16 ENCOUNTER — Telehealth: Payer: Self-pay

## 2020-01-16 NOTE — Telephone Encounter (Signed)
lvm to schedule HST

## 2020-01-20 NOTE — Telephone Encounter (Signed)
Pt has returned the call to [place], please call pt.

## 2020-02-05 ENCOUNTER — Telehealth: Payer: Self-pay

## 2020-02-05 NOTE — Telephone Encounter (Signed)
LVM for pt to call me back to schedule sleep study  

## 2020-03-13 ENCOUNTER — Other Ambulatory Visit: Payer: Self-pay

## 2020-03-13 ENCOUNTER — Other Ambulatory Visit: Payer: Self-pay | Admitting: Family Medicine

## 2020-03-13 ENCOUNTER — Ambulatory Visit (INDEPENDENT_AMBULATORY_CARE_PROVIDER_SITE_OTHER): Payer: Medicare HMO | Admitting: Family Medicine

## 2020-03-13 ENCOUNTER — Encounter: Payer: Self-pay | Admitting: Family Medicine

## 2020-03-13 VITALS — BP 140/85 | HR 94 | Temp 98.1°F | Ht 66.0 in | Wt 168.0 lb

## 2020-03-13 DIAGNOSIS — F418 Other specified anxiety disorders: Secondary | ICD-10-CM | POA: Diagnosis not present

## 2020-03-13 DIAGNOSIS — E785 Hyperlipidemia, unspecified: Secondary | ICD-10-CM

## 2020-03-13 DIAGNOSIS — R7303 Prediabetes: Secondary | ICD-10-CM

## 2020-03-13 DIAGNOSIS — Z1231 Encounter for screening mammogram for malignant neoplasm of breast: Secondary | ICD-10-CM

## 2020-03-13 DIAGNOSIS — M1711 Unilateral primary osteoarthritis, right knee: Secondary | ICD-10-CM

## 2020-03-13 MED ORDER — MELOXICAM 7.5 MG PO TABS: 7.5000 mg | ORAL_TABLET | Freq: Every evening | ORAL | 3 refills | Status: DC | PRN

## 2020-03-13 MED ORDER — HYDROXYZINE HCL 25 MG PO TABS: 12.5000 mg | ORAL_TABLET | Freq: Three times a day (TID) | ORAL | 3 refills | Status: DC | PRN

## 2020-03-13 MED ORDER — ESCITALOPRAM OXALATE 20 MG PO TABS: 10.0000 mg | ORAL_TABLET | Freq: Every day | ORAL | 1 refills | Status: DC

## 2020-03-13 MED ORDER — METFORMIN HCL 500 MG PO TABS: 500.0000 mg | ORAL_TABLET | Freq: Two times a day (BID) | ORAL | 3 refills | Status: DC

## 2020-03-13 MED ORDER — ESCITALOPRAM OXALATE 20 MG PO TABS: 20.0000 mg | ORAL_TABLET | Freq: Every day | ORAL | 1 refills | Status: DC

## 2020-03-13 NOTE — Progress Notes (Signed)
8/6/20211:59 PM  Vicki [name] Aug 15, 1960, 59 y.o., female [id number]  Chief Complaint  Patient presents with  . Anxiety    tends to hyperventilate when she gets anxious, wants to see if this med can be increased  . Depression  . Health Maintenance    consent sent for pap and cologuard    HPI:   Patient is a 59 y.o. female with past medical history significant for HLP, COPD, vitamin D deficiency, low back pain, colonic polyps, tobacco usewho presents today for routine followup  Last OV may 2021 - restarted lexapro, referred to sleep   Overall she is doing well She would like to increase lexapro as anxiety still present She has been having panic attacks She will be doing a home sleep study phq9  And gad7 noted Requesting refill of meloxicam which she takes for knee pain    Lab Results  Component Value Date   HGBA1C 6.3 (H) 12/13/2019   HGBA1C 6.3 (H) 05/16/2019   HGBA1C 6.2 01/11/2016   Lab Results  Component Value Date   LDLCALC 106 (H) 12/13/2019   CREATININE 0.59 12/13/2019   GAD 7 : Generalized Anxiety Score 03/13/2020 12/13/2019 09/13/2019  Nervous, Anxious, on Edge 1 0 3  Control/stop worrying 1 0 2  Worry too much - different things 2 0 3  Trouble relaxing 0 0 1  Restless 0 0 0  Easily annoyed or irritable 1 0 1  Afraid - awful might happen 1 0 3  Total GAD 7 Score 6 0 13  Anxiety Difficulty Not difficult at all Not difficult at all Not difficult at all     Depression screen [facility] 2/9 03/13/2020 12/13/2019 09/13/2019  Decreased Interest 1 0 0  Down, Depressed, Hopeless 1 0 0  PHQ - 2 Score 2 0 0  Altered sleeping 3 - -  Tired, decreased energy 3 - -  Change in appetite 0 - -  Feeling bad or failure about yourself  1 - -  Trouble concentrating 0 - -  Moving slowly or fidgety/restless 1 - -  Suicidal thoughts 0 - -  PHQ-9 Score 10 - -  Difficult doing work/chores Not difficult at all - -    Fall Risk  03/13/2020 12/13/2019 10/11/2019 09/13/2019 08/16/2019    Falls in the past year? 0 0 0 0 0  Number falls in past yr: 0 0 0 0 0  Injury with Fall? 0 0 0 0 0  Follow up - Falls evaluation completed - - -     Allergies  Allergen Reactions  . Codeine Nausea And Vomiting    Prior to Admission medications   Medication Sig Start Date End Date Taking? Authorizing Provider  albuterol (VENTOLIN HFA) 108 (90 Base) MCG/ACT inhaler  08/06/19  Yes [provider]  ASPIRIN 81 PO Take by mouth.   Yes [provider]  atorvastatin (LIPITOR) 10 MG tablet Take 1 tablet (10 mg total) by mouth daily. 05/16/19  Yes Rutherford Guys, MD  escitalopram (LEXAPRO) 10 MG tablet Take 1 tablet (10 mg total) by mouth at bedtime. 12/13/19  Yes Rutherford Guys, MD  meloxicam (MOBIC) 7.5 MG tablet Take 1 tablet (7.5 mg total) by mouth at bedtime as needed for pain. 12/13/19  Yes Rutherford Guys, MD  metFORMIN (GLUCOPHAGE) 500 MG tablet Take 1 tablet (500 mg total) by mouth 2 (two) times daily with a meal. 10/31/19  Yes Rutherford Guys, MD    Past Medical History:  Diagnosis Date  . Acquired hallux valgus of left foot   . Callous ulcer ([place])   . Depression with anxiety   . DEPRESSIVE DISORDER NOT ELSEWHERE CLASSIFIED   . Dyspnea   . Foot deformity, acquired   . Grief reaction   . Myalgia and myositis, unspecified   . Onychomycosis   . Oral thrush   . Rotator cuff syndrome of left shoulder   . Rotator cuff tear, right   . Shortness of breath   . SHOULDER PAIN, RIGHT   . Sleep disorder   . Urinary hesitancy     Past Surgical History:  Procedure Laterality Date  . cryotherphy  1986  . TUBAL LIGATION      Social History   Tobacco Use  . Smoking status: Current Every Day Smoker    Packs/day: 0.75    Types: Cigarettes  . Smokeless tobacco: Never Used  . Tobacco comment: increased smoking again due to stress  Substance Use Topics  . Alcohol use: No    Family History  Problem Relation Age of Onset  . Heart disease Mother   . Diabetes  Mother   . Cancer Mother        breast and ovarian  . Stroke Mother   . Breast cancer Mother        diagnosed in her 20's  . Heart disease Father     Review of Systems  Constitutional: Negative for chills and fever.  Respiratory: Negative for cough and shortness of breath.   Cardiovascular: Negative for chest pain, palpitations and leg swelling.  Gastrointestinal: Negative for abdominal pain, nausea and vomiting.     OBJECTIVE:  Today's Vitals   03/13/20 1337  BP: 140/85  Pulse: 94  Temp: 98.1 F (36.7 C)  SpO2: 94%  Weight: 168 lb (76.2 kg)  Height: 5\' 6"  (1.676 m)   Body mass index is 27.12 kg/m.   Physical Exam Vitals and nursing note reviewed.  Constitutional:      Appearance: She is well-developed.  HENT:     Head: Normocephalic and atraumatic.     Mouth/Throat:     Pharynx: No oropharyngeal exudate.  Eyes:     General: No scleral icterus.    Extraocular Movements: Extraocular movements intact.     Conjunctiva/sclera: Conjunctivae normal.     Pupils: Pupils are equal, round, and reactive to light.  Cardiovascular:     Rate and Rhythm: Normal rate and regular rhythm.     Heart sounds: Normal heart sounds. No murmur heard.  No friction rub. No gallop.   Pulmonary:     Effort: Pulmonary effort is normal.     Breath sounds: Normal breath sounds. No wheezing, rhonchi or rales.  Musculoskeletal:     Cervical back: Neck supple.  Skin:    General: Skin is warm and dry.  Neurological:     Mental Status: She is alert and oriented to person, place, and time.     No results found for this or any previous visit (from the past 24 hour(s)).  No results found.   ASSESSMENT and PLAN  1. Depression with anxiety Not controlled. Increase lexapro to 20mg . Adding vistaril prn panic attacks - escitalopram (LEXAPRO) 20 MG tablet; Take 1 tablet (20 mg total) by mouth at bedtime.  2. Prediabetes Controlled. Continue current regime.  - metFORMIN (GLUCOPHAGE) 500  MG tablet; Take 1 tablet (500 mg total) by mouth 2 (two) times daily with a meal.  3. Hyperlipidemia, unspecified hyperlipidemia type Controlled.  Continue current regime.   4. Primary osteoarthritis of right knee Refilled meloxicam  Other orders - hydrOXYzine (ATARAX/VISTARIL) 25 MG tablet; Take 0.5-1 tablets (12.5-25 mg total) by mouth every 8 (eight) hours as needed for anxiety. - meloxicam (MOBIC) 7.5 MG tablet; Take 1 tablet (7.5 mg total) by mouth at bedtime as needed for pain.  Return for CPE with pap.    Rutherford Guys, MD [facility] [place], [place] [address] Ph.  [phone] Fax [phone]

## 2020-03-16 ENCOUNTER — Ambulatory Visit (INDEPENDENT_AMBULATORY_CARE_PROVIDER_SITE_OTHER): Payer: Medicare HMO | Admitting: Neurology

## 2020-03-16 ENCOUNTER — Other Ambulatory Visit: Payer: Self-pay

## 2020-03-16 DIAGNOSIS — F17200 Nicotine dependence, unspecified, uncomplicated: Secondary | ICD-10-CM

## 2020-03-16 DIAGNOSIS — G4733 Obstructive sleep apnea (adult) (pediatric): Secondary | ICD-10-CM | POA: Diagnosis not present

## 2020-03-16 DIAGNOSIS — J449 Chronic obstructive pulmonary disease, unspecified: Secondary | ICD-10-CM

## 2020-03-16 DIAGNOSIS — E663 Overweight: Secondary | ICD-10-CM

## 2020-03-16 DIAGNOSIS — R0683 Snoring: Secondary | ICD-10-CM

## 2020-03-16 DIAGNOSIS — R519 Headache, unspecified: Secondary | ICD-10-CM

## 2020-03-16 DIAGNOSIS — R351 Nocturia: Secondary | ICD-10-CM

## 2020-03-16 DIAGNOSIS — G4719 Other hypersomnia: Secondary | ICD-10-CM

## 2020-03-25 ENCOUNTER — Ambulatory Visit
Admission: RE | Admit: 2020-03-25 | Discharge: 2020-03-25 | Disposition: A | Discharge status: Home / Self Care | Payer: Medicare HMO | Source: Ambulatory Visit | Attending: Family Medicine | Admitting: Family Medicine

## 2020-03-25 ENCOUNTER — Other Ambulatory Visit: Payer: Self-pay

## 2020-03-25 DIAGNOSIS — Z1231 Encounter for screening mammogram for malignant neoplasm of breast: Secondary | ICD-10-CM | POA: Diagnosis not present

## 2020-03-27 NOTE — Addendum Note (Signed)
Addended by: Star Age on: 03/27/2020 12:06 PM   Modules accepted: Orders

## 2020-03-27 NOTE — Progress Notes (Signed)
Patient referred by Dr. Pamella Pert, seen by me on 01/02/20, HST on 03/16/20.    Please call and notify the patient that the recent home sleep test showed obstructive sleep apnea in the severe range.  I recommend that she start AutoPap therapy at home, through a DME company (of her choice, or as per insurance requirement). The DME representative will educate her on how to use the machine, how to put the mask on, etc. I have placed an order in the chart. Please send referral, talk to patient, send report to referring MD. We will need a FU in sleep clinic for 10 weeks post-PAP set up, please arrange that with me or one of our NPs. Thanks,   Star Age, MD, PhD [facility])

## 2020-03-27 NOTE — Procedures (Signed)
Patient Information     First Name: Vicki Median Last Name: [name] ID: [id number]  Birth Date: 2060/09/28 Age: 59   Referring Provider: Armandina Stammer BMI: 27.3 (W=170 lb, H=5' 6'')  Neck Circ. 15 '' Epworth:  13   Sleep Study Information    Study Date: 03/12/20 S/H/A Version: 001.001.001.001 / 4.1.1528 / 10    History:    59 year old woman with a history of COPD, vitamin D deficiency, hyperlipidemia, chronic back pain, anxiety, depression, and overweight state, who reports snoring and excessive daytime somnolence. Summary & Diagnosis:     Severe OSA Recommendations:     This home sleep test demonstrates severe obstructive sleep apnea with a total AHI of 54.2/hour and O2 nadir of 80%. The patient will be advised to start positive airway pressure treatment at home in the form of an autoPAP titration/trial for now.  A full night CPAP titration study can be considered if need be for optimization of treatment in the future.  Please note, that untreated obstructive sleep apnea may carry additional perioperative morbidity. Patients with significant obstructive sleep apnea should receive perioperative PAP therapy and the surgeons and particularly the anesthesiologist should be informed of the diagnosis and the severity of the sleep disordered breathing. The patient should be cautioned not to drive, work at heights, or operate dangerous or heavy equipment when tired or sleepy. Review and reiteration of good sleep hygiene measures should be pursued with any patient. Other causes of the patient's symptoms, including circadian rhythm disturbances, an underlying mood disorder, medication effect and/or an underlying medical problem cannot be ruled out based on this test. Clinical correlation is recommended. The patient and her referring provider will be notified of the test results. The patient will be seen in follow up in sleep clinic at [facility].  I certify that I have reviewed the raw data recording prior to the issuance of this  report in accordance with the standards of the American Academy of Sleep Medicine (AASM).  Star Age, MD, PhD [facility]) Diplomat, ABPN (Neurology and Sleep)            Sleep Summary  Oxygen Saturation Statistics   Start Study Time: End Study Time: Total Recording Time:  12:02:02 M 7:33:47 AM 7 hrs, 48min  Total Sleep Time % REM of Sleep Time:  6 hrs, 29 min 12.7    Mean: 90 Minimum: 80 Maximum: 97  Mean of Desaturations Nadirs (%):   88  Oxygen Desatur. %:   4-9 10-20 >20 Total  Events Number Total   168  2 98.8 1.2  0 0.0  170 100.0  Oxygen Saturation: <90 <=88 <85 <80 <70  Duration (minutes): Sleep % 89.4 22.9 39.2 2.0 10.1 0.5 0.0 0.0 0.0 0.0     Respiratory Indices      Total Events REM NREM All Night  pRDI:  295 pAHI:  291 ODI:  170  pAHIc:  18  % CSR: 0.0 66.1 66.1 46.1 0.0 53.4 52.6 29.7 3.8 55.0 54.2 31.7 3.4       Pulse Rate Statistics during Sleep (BPM)      Mean:  78 Minimum: 41 Maximum: 108    Indices are calculated using technically valid sleep time of  5 hrs, 22 min. pRDI/pAHI are calculated using oxi desaturations ? 3% Sit Body Position Statistics  Position Supine Prone Right Left Non-Supine  Sleep (min) 388.0 0.0 0.0 1.5 1.5  Sleep % 99.6 0.0 0.0 0.4 0.4  pRDI 54.8 N/A N/A N/A  N/A  pAHI 54.1 N/A N/A N/A N/A  ODI 31.7 N/A N/A N/A N/A     Snoring Statistics Snoring Level (dB) >40 >50 >60 >70 >80 >Threshold (45)  Sleep (min) 169.8 6.8 2.7 0.0 0.0 18.9  Sleep % 43.6 1.7 0.7 0.0 0.0 4.8    Mean: 41 dB Sleep Stages Chart                                                                             pAHI=54.2                                                                                              Mild              Moderate                    Severe                                                 5              15                    30  * Reference values are given by physician

## 2020-03-28 DIAGNOSIS — J441 Chronic obstructive pulmonary disease with (acute) exacerbation: Secondary | ICD-10-CM | POA: Diagnosis not present

## 2020-03-30 ENCOUNTER — Telehealth: Payer: Self-pay

## 2020-03-30 NOTE — Telephone Encounter (Signed)
-----   Message from Star Age, MD sent at 03/27/2020 12:06 PM EDT ----- Patient referred by Dr. Pamella Pert, seen by me on 01/02/20, HST on 03/16/20.    Please call and notify the patient that the recent home sleep test showed obstructive sleep apnea in the severe range.  I recommend that she start AutoPap therapy at home, through a DME company (of her choice, or as per insurance requirement). The DME representative will educate her on how to use the machine, how to put the mask on, etc. I have placed an order in the chart. Please send referral, talk to patient, send report to referring MD. We will need a FU in sleep clinic for 10 weeks post-PAP set up, please arrange that with me or one of our NPs. Thanks,   Star Age, MD, PhD [facility])

## 2020-03-30 NOTE — Telephone Encounter (Signed)
I called pt. No answer, left a message asking pt to call me back.   

## 2020-03-31 NOTE — Telephone Encounter (Signed)
Pt is asking for a call back from [facility]

## 2020-03-31 NOTE — Telephone Encounter (Signed)
Pt called back for sleep study. I advised pt that Dr. Rexene Alberts reviewed their sleep study results and found that pt had severe osa. Dr. Rexene Alberts recommends that pt start an autopap at home for therapy. I reviewed PAP compliance expectations with the pt. Pt is agreeable to starting an auto-PAP. I advised pt that an order will be sent to a DME, [facility], and [facility] will call the pt within about one week after they file with the pt's insurance. [facility] will show the pt how to use the machine, fit for masks, and troubleshoot the auto-PAP if needed. A follow up appt was made for insurance purposes with Dr. Rexene Alberts on 05/21/2020 at 1030 am. Pt verbalized understanding to arrive 15 minutes early and bring their auto-PAP. A letter with all of this information in it will be mailed to the pt as a reminder. I verified with the pt that the address we have on file is correct. Pt verbalized understanding of results. Pt had no questions at this time but was encouraged to call back if questions arise. I have sent the order to [facility] and have received confirmation that they have received the order.

## 2020-04-20 DIAGNOSIS — F17210 Nicotine dependence, cigarettes, uncomplicated: Secondary | ICD-10-CM | POA: Diagnosis not present

## 2020-04-21 ENCOUNTER — Other Ambulatory Visit: Payer: Self-pay

## 2020-04-21 ENCOUNTER — Ambulatory Visit (INDEPENDENT_AMBULATORY_CARE_PROVIDER_SITE_OTHER): Payer: Medicare HMO

## 2020-04-21 ENCOUNTER — Ambulatory Visit (HOSPITAL_COMMUNITY)
Admission: EM | Admit: 2020-04-21 | Discharge: 2020-04-21 | Disposition: A | Discharge status: Home / Self Care | Payer: Medicare HMO | Attending: Internal Medicine | Admitting: Internal Medicine

## 2020-04-21 ENCOUNTER — Encounter (HOSPITAL_COMMUNITY): Payer: Self-pay | Admitting: Emergency Medicine

## 2020-04-21 DIAGNOSIS — J441 Chronic obstructive pulmonary disease with (acute) exacerbation: Secondary | ICD-10-CM | POA: Diagnosis not present

## 2020-04-21 DIAGNOSIS — R05 Cough: Secondary | ICD-10-CM

## 2020-04-21 DIAGNOSIS — J90 Pleural effusion, not elsewhere classified: Secondary | ICD-10-CM | POA: Diagnosis not present

## 2020-04-21 DIAGNOSIS — J189 Pneumonia, unspecified organism: Secondary | ICD-10-CM

## 2020-04-21 DIAGNOSIS — R0602 Shortness of breath: Secondary | ICD-10-CM

## 2020-04-21 MED ORDER — METHYLPREDNISOLONE SODIUM SUCC 125 MG IJ SOLR
INTRAMUSCULAR | Status: AC
  Filled 2020-04-21: qty 2

## 2020-04-21 MED ORDER — BENZONATATE 200 MG PO CAPS: 200.0000 mg | ORAL_CAPSULE | Freq: Three times a day (TID) | ORAL | 0 refills | Status: AC | PRN

## 2020-04-21 MED ORDER — PREDNISONE 50 MG PO TABS: 50.0000 mg | ORAL_TABLET | Freq: Every day | ORAL | 0 refills | Status: AC

## 2020-04-21 MED ORDER — ALBUTEROL SULFATE HFA 108 (90 BASE) MCG/ACT IN AERS
INHALATION_SPRAY | RESPIRATORY_TRACT | Status: AC
  Filled 2020-04-21: qty 6.7

## 2020-04-21 MED ORDER — AZITHROMYCIN 250 MG PO TABS: 250.0000 mg | ORAL_TABLET | Freq: Every day | ORAL | 0 refills | Status: DC

## 2020-04-21 MED ORDER — AMOXICILLIN-POT CLAVULANATE 875-125 MG PO TABS: 1.0000 | ORAL_TABLET | Freq: Two times a day (BID) | ORAL | 0 refills | Status: DC

## 2020-04-21 MED ORDER — CEFTRIAXONE SODIUM 1 G IJ SOLR
INTRAMUSCULAR | Status: AC
  Filled 2020-04-21: qty 10

## 2020-04-21 MED ORDER — ALBUTEROL SULFATE HFA 108 (90 BASE) MCG/ACT IN AERS
2.0000 | INHALATION_SPRAY | Freq: Once | RESPIRATORY_TRACT | Status: AC
  Administered 2020-04-21: 2 via RESPIRATORY_TRACT

## 2020-04-21 MED ORDER — LIDOCAINE HCL (PF) 1 % IJ SOLN
INTRAMUSCULAR | Status: AC
  Filled 2020-04-21: qty 4

## 2020-04-21 MED ORDER — METHYLPREDNISOLONE SODIUM SUCC 125 MG IJ SOLR
125.0000 mg | Freq: Once | INTRAMUSCULAR | Status: AC
  Administered 2020-04-21: 125 mg via INTRAMUSCULAR

## 2020-04-21 MED ORDER — CEFTRIAXONE SODIUM 1 G IJ SOLR
1.0000 g | Freq: Once | INTRAMUSCULAR | Status: AC
  Administered 2020-04-21: 1 g via INTRAMUSCULAR

## 2020-04-21 NOTE — ED Triage Notes (Addendum)
Patient presents to [facility] for assessment of right sided chest pain and SOB starting Saturday.  Patient states Friday she did weight lighting.  Patient states she is a smoker, and has an inhaler, which causes her to cough right now when she uses it, and she states she is noting bright, light red blood in her mucous when she coughs up.  Patient denies jaw pain, c/o back pain between her shoulder blades and neck.  Patient does endorse more than normal cough, sinus congestion.  Patient states she has an oxygen tank at home from a previous illness, and she had to put herelf on it to sleep last night.;

## 2020-04-21 NOTE — Discharge Instructions (Signed)
Chest xray with right sided pneumonia We gave you an injection of Solu-Medrol and Rocephin today Begin Augmentin twice daily for 1 week Azithromycin-2 tablets today, 1 tablet for the following 4 days Tessalon/benzonatate every 8 hours for cough Prednisone 50 mg daily for 5 days with food Continue albuterol inhaler as needed for shortness of breath, wheezing  Please go to the emergency room if having any worsening symptoms, increased shortness of breath, chest pain or increased dizziness or lightheadedness

## 2020-04-21 NOTE — ED Provider Notes (Signed)
[place]    CSN: [id number] Arrival date & time: 04/21/20  1221      History   Chief Complaint Chief Complaint  Patient presents with   Chest Pain    HPI Teandra Polly Barner is a 59 y.o. female history of COPD, tobacco use, presenting today for evaluation of shortness of breath.  Patient reports over the past few days she has had increased shortness of breath and difficulty breathing.  Has developed discomfort in her right chest and neck over the past 1 to 2 days.  Cough has been productive, blood-tinged sputum.  Using at home O2 as needed.  Using albuterol inhaler without full relief of symptoms.  Has had some body aches and chills.  Denies known exposure to Covid.  HPI  Past Medical History:  Diagnosis Date   Acquired hallux valgus of left foot    Callous ulcer ([place])    Depression with anxiety    DEPRESSIVE DISORDER NOT ELSEWHERE CLASSIFIED    Dyspnea    Foot deformity, acquired    Grief reaction    Myalgia and myositis, unspecified    Onychomycosis    Oral thrush    Rotator cuff syndrome of left shoulder    Rotator cuff tear, right    Shortness of breath    SHOULDER PAIN, RIGHT    Sleep disorder    Urinary hesitancy     Patient Active Problem List   Diagnosis Date Noted   Influenza A 09/26/2018   COPD with acute exacerbation ([place]) 09/26/2018   Neck pain 02/05/2016   Leg swelling 01/19/2016   Dry cough 02/11/2015   Fatigue 02/11/2015   Prediabetes 10/29/2014   Polyuria 10/28/2014   Hyperglycemia 10/28/2014   Left breast mass 04/11/2014   Right hip pain 03/21/2014   Edema, lower extremity 03/21/2014   Low back pain 12/17/2013   Right knee pain 12/17/2013   Chest pain, central 11/05/2013   Lateral epicondylitis of left elbow 11/05/2013   Obstructive chronic bronchitis without exacerbation ([place]) 08/23/2013   Wrist fracture, right 07/08/2013   Sciatica 05/28/2013   Grief reaction 04/02/2013   Myalgia and  myositis, unspecified 03/07/2012   Rotator cuff syndrome of left shoulder 03/07/2012   Depression with anxiety 03/07/2012   Foot deformity, acquired 06/21/2011   Callous ulcer ([place]) 06/21/2011   Acquired hallux valgus of left foot 05/10/2011   Sleep disorder 02/04/2011   Rotator cuff tear, right 09/03/2010   SHOULDER PAIN, RIGHT 08/20/2010   TOBACCO ABUSE 06/11/2010   DEPRESSIVE DISORDER NOT ELSEWHERE CLASSIFIED 06/11/2010    Past Surgical History:  Procedure Laterality Date   cryotherphy  1986   TUBAL LIGATION      OB History    Gravida  4   Para  3   Term  3   Preterm      AB  1   Living  3     SAB      TAB  1   Ectopic      Multiple      Live Births               Home Medications    Prior to Admission medications   Medication Sig Start Date End Date Taking? Authorizing Provider  albuterol (VENTOLIN HFA) 108 (90 Base) MCG/ACT inhaler  08/06/19   [provider]  amoxicillin-clavulanate (AUGMENTIN) 875-125 MG tablet Take 1 tablet by mouth every 12 (twelve) hours. 04/21/20   Jannelly Bergren C, PA-C  ASPIRIN 81  PO Take by mouth.    [provider]  atorvastatin (LIPITOR) 10 MG tablet Take 1 tablet (10 mg total) by mouth daily. 05/16/19   Rutherford Guys, MD  azithromycin (ZITHROMAX) 250 MG tablet Take 1 tablet (250 mg total) by mouth daily. Take first 2 tablets together, then 1 every day until finished. 04/21/20   Day Deery C, PA-C  benzonatate (TESSALON) 200 MG capsule Take 1 capsule (200 mg total) by mouth 3 (three) times daily as needed for up to 7 days for cough. 04/21/20 04/28/20  Syretta Kochel C, PA-C  escitalopram (LEXAPRO) 20 MG tablet Take 1 tablet (20 mg total) by mouth at bedtime. 03/13/20   Rutherford Guys, MD  hydrOXYzine (ATARAX/VISTARIL) 25 MG tablet Take 0.5-1 tablets (12.5-25 mg total) by mouth every 8 (eight) hours as needed for anxiety. 03/13/20   Rutherford Guys, MD  meloxicam (MOBIC) 7.5 MG tablet Take 1  tablet (7.5 mg total) by mouth at bedtime as needed for pain. 03/13/20   Rutherford Guys, MD  metFORMIN (GLUCOPHAGE) 500 MG tablet Take 1 tablet (500 mg total) by mouth 2 (two) times daily with a meal. 03/13/20   Rutherford Guys, MD  predniSONE (DELTASONE) 50 MG tablet Take 1 tablet (50 mg total) by mouth daily with breakfast for 5 days. 04/21/20 04/26/20  Takiesha Mcdevitt, Elesa Hacker, PA-C    Family History Family History  Problem Relation Age of Onset   Heart disease Mother    Diabetes Mother    Cancer Mother        breast and ovarian   Stroke Mother    Breast cancer Mother        diagnosed in her 19's   Heart disease Father     Social History Social History   Tobacco Use   Smoking status: Current Every Day Smoker    Packs/day: 0.75    Types: Cigarettes   Smokeless tobacco: Never Used   Tobacco comment: increased smoking again due to stress  Substance Use Topics   Alcohol use: No   Drug use: No     Allergies   Codeine   Review of Systems Review of Systems  Constitutional: Positive for chills and fatigue. Negative for activity change, appetite change and fever.  HENT: Positive for congestion. Negative for ear pain, rhinorrhea, sinus pressure, sore throat and trouble swallowing.   Eyes: Negative for discharge and redness.  Respiratory: Positive for cough, shortness of breath and wheezing. Negative for chest tightness.   Cardiovascular: Negative for chest pain.  Gastrointestinal: Negative for abdominal pain, diarrhea, nausea and vomiting.  Musculoskeletal: Negative for myalgias.  Skin: Negative for rash.  Neurological: Negative for dizziness, light-headedness and headaches.     Physical Exam Triage Vital Signs ED Triage Vitals  Enc Vitals Group     BP 04/21/20 1413 (!) 163/77     Pulse Rate 04/21/20 1413 (!) 107     Resp 04/21/20 1413 (!) 22     Temp 04/21/20 1413 99.3 F (37.4 C)     Temp Source 04/21/20 1413 Oral     SpO2 04/21/20 1413 90 %     Weight --       Height --      Head Circumference --      Peak Flow --      Pain Score 04/21/20 1415 5     Pain Loc --      Pain Edu? --      Excl. in [place]? --  No data found.  Updated Vital Signs BP (!) 163/77 (BP Location: Right Arm)    Pulse (!) 110    Temp 99.3 F (37.4 C) (Oral)    Resp (!) 22    SpO2 94%   Visual Acuity Right Eye Distance:   Left Eye Distance:   Bilateral Distance:    Right Eye Near:   Left Eye Near:    Bilateral Near:     Physical Exam Vitals and nursing note reviewed.  Constitutional:      Appearance: She is well-developed.     Comments: No acute distress  HENT:     Head: Normocephalic and atraumatic.     Nose: Nose normal.     Mouth/Throat:     Comments: Oral mucosa pink and moist, no tonsillar enlargement or exudate. Posterior pharynx patent and nonerythematous, no uvula deviation or swelling. Normal phonation. Eyes:     Conjunctiva/sclera: Conjunctivae normal.  Cardiovascular:     Rate and Rhythm: Normal rate.  Pulmonary:     Effort: Pulmonary effort is normal. No respiratory distress.     Breath sounds: Wheezing present.     Comments: Patient tachypneic, no accessory muscle use, wheezing auscultated throughout bilateral lung fields; diminished breath sounds notable on right Abdominal:     General: There is no distension.  Musculoskeletal:        General: Normal range of motion.     Cervical back: Neck supple.  Skin:    General: Skin is warm and dry.  Neurological:     Mental Status: She is alert and oriented to person, place, and time.      UC Treatments / Results  Labs (all labs ordered are listed, but only abnormal results are displayed) Labs Reviewed - No data to display  EKG   Radiology DG Chest 2 View  Result Date: 04/21/2020 CLINICAL DATA:  Shortness of breath and productive cough for the past 2 days. EXAM: CHEST - 2 VIEW COMPARISON:  Chest x-ray dated September 25, 2018. FINDINGS: The heart size and mediastinal contours are within  normal limits. Normal pulmonary vascularity. Consolidation in the anterior inferior right upper lobe and throughout the right middle lobe. The left lung is clear. Trace right pleural effusion. No pneumothorax. No acute osseous abnormality. IMPRESSION: 1. Right upper and middle lobe pneumonia. Followup PA and lateral chest X-ray is recommended in 3-4 weeks following trial of antibiotic therapy to ensure resolution. Electronically Signed   By: Titus Dubin M.D.   On: 04/21/2020 16:32    Procedures Procedures (including critical care time)  Medications Ordered in UC Medications  methylPREDNISolone sodium succinate (SOLU-MEDROL) 125 mg/2 mL injection 125 mg (125 mg Intramuscular Given 04/21/20 1554)  albuterol (VENTOLIN HFA) 108 (90 Base) MCG/ACT inhaler 2 puff (2 puffs Inhalation Given 04/21/20 1555)  cefTRIAXone (ROCEPHIN) injection 1 g (1 g Intramuscular Given 04/21/20 1643)    Initial Impression / Assessment and Plan / UC Course  I have reviewed the triage vital signs and the nursing notes.  Pertinent labs & imaging results that were available during my care of the patient were reviewed by me and considered in my medical decision making (see chart for details).  Clinical Course as of Apr 22 1402  Tue Apr 21, 2020  1533 ED EKG [HW]    Clinical Course User Index [HW] Saraya Tirey C, PA-C    EKG sinus tachycardia at 107, no acute signs of ischemia or infarction, suspect likely COPD exacerbation.  Provided albuterol inhaler 2 puffs,  Solu-Medrol 125 IM. O2 did improve to 93-94% at rest, but does drop with exertion. CXR with Right pneumonia, likely bacterial, and with COPD exacerbation. Discussed inpatient treatment vs outpatient. Recommended inpatient as 1st recommendadtion and Size of pnemonia, O2 readings and in setting of COPD. Patient declined going to ED and preferred outpatient treatment. Discussed risks. Patient has O2 tank at home. Sending home with Augmentin and Azithromycin,  Rocephin 1 g prior to discharge. Tessalon for cough, and prednisone for COPD. Discussed she should have a low threshold for going to ED with any worsening or not improving symptoms.   Discussed strict return precautions. Patient verbalized understanding and is agreeable with plan.    Final Clinical Impressions(s) / UC Diagnoses   Final diagnoses:  Community acquired pneumonia of right lower lobe of lung  COPD exacerbation ([place])     Discharge Instructions     Chest xray with right sided pneumonia We gave you an injection of Solu-Medrol and Rocephin today Begin Augmentin twice daily for 1 week Azithromycin-2 tablets today, 1 tablet for the following 4 days Tessalon/benzonatate every 8 hours for cough Prednisone 50 mg daily for 5 days with food Continue albuterol inhaler as needed for shortness of breath, wheezing  Please go to the emergency room if having any worsening symptoms, increased shortness of breath, chest pain or increased dizziness or lightheadedness    ED Prescriptions    Medication Sig Dispense Auth. Provider   azithromycin (ZITHROMAX) 250 MG tablet Take 1 tablet (250 mg total) by mouth daily. Take first 2 tablets together, then 1 every day until finished. 6 tablet Jerrid Forgette C, PA-C   amoxicillin-clavulanate (AUGMENTIN) 875-125 MG tablet Take 1 tablet by mouth every 12 (twelve) hours. 14 tablet Faheem Ziemann C, PA-C   benzonatate (TESSALON) 200 MG capsule Take 1 capsule (200 mg total) by mouth 3 (three) times daily as needed for up to 7 days for cough. 28 capsule Pratyush Ammon C, PA-C   predniSONE (DELTASONE) 50 MG tablet Take 1 tablet (50 mg total) by mouth daily with breakfast for 5 days. 5 tablet Dashiel Bergquist, [place] C, PA-C     PDMP not reviewed this encounter.   Janith Lima, PA-C 04/22/20 1406

## 2020-04-28 DIAGNOSIS — J441 Chronic obstructive pulmonary disease with (acute) exacerbation: Secondary | ICD-10-CM | POA: Diagnosis not present

## 2020-04-30 ENCOUNTER — Other Ambulatory Visit: Payer: Self-pay

## 2020-04-30 ENCOUNTER — Ambulatory Visit (INDEPENDENT_AMBULATORY_CARE_PROVIDER_SITE_OTHER): Payer: Medicare HMO | Admitting: Family Medicine

## 2020-04-30 ENCOUNTER — Encounter: Payer: Self-pay | Admitting: Family Medicine

## 2020-04-30 VITALS — BP 131/86 | HR 92 | Temp 98.2°F | Ht 66.0 in | Wt 163.8 lb

## 2020-04-30 DIAGNOSIS — J189 Pneumonia, unspecified organism: Secondary | ICD-10-CM

## 2020-04-30 DIAGNOSIS — Z1389 Encounter for screening for other disorder: Secondary | ICD-10-CM

## 2020-04-30 DIAGNOSIS — Z0001 Encounter for general adult medical examination with abnormal findings: Secondary | ICD-10-CM

## 2020-04-30 DIAGNOSIS — Z0000 Encounter for general adult medical examination without abnormal findings: Secondary | ICD-10-CM

## 2020-04-30 LAB — POCT URINALYSIS DIP (MANUAL ENTRY)
Bilirubin, UA: NEGATIVE
Blood, UA: NEGATIVE
Glucose, UA: NEGATIVE mg/dL
Ketones, POC UA: NEGATIVE mg/dL
Nitrite, UA: NEGATIVE
Protein Ur, POC: NEGATIVE mg/dL
Spec Grav, UA: 1.02 (ref 1.010–1.025)
Urobilinogen, UA: 0.2 E.U./dL
pH, UA: 7 (ref 5.0–8.0)

## 2020-04-30 MED ORDER — FLUCONAZOLE 150 MG PO TABS: 150.0000 mg | ORAL_TABLET | Freq: Once | ORAL | 0 refills | Status: AC

## 2020-04-30 NOTE — Progress Notes (Signed)
Presents today for [facility] Visit-Initial.   Date of last exam: last [place] in 2017  Interpreter used for this visit? n/a  Patient Care Team: Rutherford Guys, MD as PCP - General (Family Medicine)  Other items to address today:  treated for PNA (RML and RLL) on sept 14 with augmentin and azithromyin, declined hosp - doing much better, has not needed to use her oxygen, last albuterol use yesterday, only used twice, thinks she now has yeast infection as having vaginal itching, completed abx 2 days ago Had sleep eval - severe OSA, auto-titrate pap ordered  Cancer Screening: Cervical: neg pap and HPV April 2017, due in April 2022 Breast: mammo aug 2021 - normal Colon: has h/o polyps, no fhx, last colonoscopy in 2019 at [facility] [place], will request records  Other Screening: Last screening for diabetes: has prediabetes, may 2021 Lab Results  Component Value Date   HGBA1C 6.3 (H) 12/13/2019   Last lipid screening: has HLP, may 2021 Lab Results  Component Value Date   CHOL 172 12/13/2019   HDL 44 12/13/2019   LDLCALC 106 (H) 12/13/2019   LDLDIRECT 114 (H) 06/11/2010   TRIG 124 12/13/2019   CHOLHDL 3.9 12/13/2019    Health Maintenance Due  Topic Date Due  . COLONOSCOPY  Never done   Eye exam: April 2021 with Dr [place] (notes in media) Foot exam: sept 2021 with podiatrist at [facility] (notes in media)   ADVANCE DIRECTIVES: Discussed: no  Immunization status:  Immunization History  Administered Date(s) Administered  . Influenza Split 05/29/2012  . Influenza Whole 06/11/2010  . PFIZER SARS-COV-2 Vaccination 04/14/2020     Health Maintenance Due  Topic Date Due  . COVID-19 Vaccine (1) Never done  . COLONOSCOPY  Never done    6CIT Screen 04/30/2020  What Year? 0 points  What month? 0 points  What time? 0 points  Count back from 20 0 points  Months in reverse 0 points  Repeat phrase 0 points  Total Score 0    Functional Status  Survey: Is the patient deaf or have difficulty hearing?: No Does the patient have difficulty seeing, even when wearing glasses/contacts?: Yes Does the patient have difficulty concentrating, remembering, or making decisions?: No Does the patient have difficulty walking or climbing stairs?: No Does the patient have difficulty dressing or bathing?: No Does the patient have difficulty doing errands alone such as visiting a doctor's office or shopping?: No  She is cutting back on smoking, was doing about 18 cig a day and now down to 3-4 a day  Home Environment: denies any safety concerns, falls  Urinary Incontinence Screening: denies any concerns  Patient Active Problem List   Diagnosis Date Noted  . Influenza A 09/26/2018  . COPD with acute exacerbation ([place]) 09/26/2018  . Neck pain 02/05/2016  . Leg swelling 01/19/2016  . Dry cough 02/11/2015  . Fatigue 02/11/2015  . Prediabetes 10/29/2014  . Polyuria 10/28/2014  . Hyperglycemia 10/28/2014  . Left breast mass 04/11/2014  . Right hip pain 03/21/2014  . Edema, lower extremity 03/21/2014  . Low back pain 12/17/2013  . Right knee pain 12/17/2013  . Chest pain, central 11/05/2013  . Lateral epicondylitis of left elbow 11/05/2013  . Obstructive chronic bronchitis without exacerbation ([place]) 08/23/2013  . Wrist fracture, right 07/08/2013  . Sciatica 05/28/2013  . Grief reaction 04/02/2013  . Myalgia and myositis, unspecified 03/07/2012  . Rotator cuff syndrome of left shoulder 03/07/2012  .  Depression with anxiety 03/07/2012  . Foot deformity, acquired 06/21/2011  . Callous ulcer ([place]) 06/21/2011  . Acquired hallux valgus of left foot 05/10/2011  . Sleep disorder 02/04/2011  . Rotator cuff tear, right 09/03/2010  . SHOULDER PAIN, RIGHT 08/20/2010  . TOBACCO ABUSE 06/11/2010  . DEPRESSIVE DISORDER NOT ELSEWHERE CLASSIFIED 06/11/2010     Past Medical History:  Diagnosis Date  . Acquired hallux valgus of left foot   . Callous  ulcer ([place])   . Depression with anxiety   . DEPRESSIVE DISORDER NOT ELSEWHERE CLASSIFIED   . Dyspnea   . Foot deformity, acquired   . Grief reaction   . Myalgia and myositis, unspecified   . Onychomycosis   . Oral thrush   . Rotator cuff syndrome of left shoulder   . Rotator cuff tear, right   . Shortness of breath   . SHOULDER PAIN, RIGHT   . Sleep disorder   . Urinary hesitancy      Past Surgical History:  Procedure Laterality Date  . cryotherphy  1986  . TUBAL LIGATION       Family History  Problem Relation Age of Onset  . Heart disease Mother   . Diabetes Mother   . Cancer Mother        breast and ovarian  . Stroke Mother   . Breast cancer Mother        diagnosed in her 74's  . Heart disease Father      Social History   Socioeconomic History  . Marital status: Widowed    Spouse name: Not on file  . Number of children: Not on file  . Years of education: Not on file  . Highest education level: Not on file  Occupational History  . Not on file  Tobacco Use  . Smoking status: Current Every Day Smoker    Packs/day: 0.75    Types: Cigarettes  . Smokeless tobacco: Never Used  . Tobacco comment: increased smoking again due to stress  Substance and Sexual Activity  . Alcohol use: No  . Drug use: No  . Sexual activity: Never    Birth control/protection: Surgical, Post-menopausal  Other Topics Concern  . Not on file  Social History Narrative  . Not on file   Social Determinants of Health   Financial Resource Strain:   . Difficulty of Paying Living Expenses: Not on file  Food Insecurity:   . Worried About Charity fundraiser in the Last Year: Not on file  . Ran Out of Food in the Last Year: Not on file  Transportation Needs:   . Lack of Transportation (Medical): Not on file  . Lack of Transportation (Non-Medical): Not on file  Physical Activity:   . Days of Exercise per Week: Not on file  . Minutes of Exercise per Session: Not on file  Stress:   .  Feeling of Stress : Not on file  Social Connections:   . Frequency of Communication with Friends and Family: Not on file  . Frequency of Social Gatherings with Friends and Family: Not on file  . Attends Religious Services: Not on file  . Active Member of Clubs or Organizations: Not on file  . Attends Archivist Meetings: Not on file  . Marital Status: Not on file  Intimate Partner Violence:   . Fear of Current or Ex-Partner: Not on file  . Emotionally Abused: Not on file  . Physically Abused: Not on file  . Sexually Abused: Not  on file     Allergies  Allergen Reactions  . Codeine Nausea And Vomiting     Prior to Admission medications   Medication Sig Start Date End Date Taking? Authorizing Provider  albuterol (VENTOLIN HFA) 108 (90 Base) MCG/ACT inhaler  08/06/19   [provider]  amoxicillin-clavulanate (AUGMENTIN) 875-125 MG tablet Take 1 tablet by mouth every 12 (twelve) hours. 04/21/20   Wieters, Hallie C, PA-C  ASPIRIN 81 PO Take by mouth.    [provider]  atorvastatin (LIPITOR) 10 MG tablet Take 1 tablet (10 mg total) by mouth daily. 05/16/19   Rutherford Guys, MD  azithromycin (ZITHROMAX) 250 MG tablet Take 1 tablet (250 mg total) by mouth daily. Take first 2 tablets together, then 1 every day until finished. 04/21/20   Wieters, Hallie C, PA-C  escitalopram (LEXAPRO) 20 MG tablet Take 1 tablet (20 mg total) by mouth at bedtime. 03/13/20   Rutherford Guys, MD  hydrOXYzine (ATARAX/VISTARIL) 25 MG tablet Take 0.5-1 tablets (12.5-25 mg total) by mouth every 8 (eight) hours as needed for anxiety. 03/13/20   Rutherford Guys, MD  meloxicam (MOBIC) 7.5 MG tablet Take 1 tablet (7.5 mg total) by mouth at bedtime as needed for pain. 03/13/20   Rutherford Guys, MD  metFORMIN (GLUCOPHAGE) 500 MG tablet Take 1 tablet (500 mg total) by mouth 2 (two) times daily with a meal. 03/13/20   Rutherford Guys, MD     Depression screen [facility] 2/9 03/13/2020 12/13/2019 09/13/2019  08/16/2019 05/16/2019  Decreased Interest 1 0 0 0 0  Down, Depressed, Hopeless 1 0 0 0 3  PHQ - 2 Score 2 0 0 0 3  Altered sleeping 3 - - - 1  Tired, decreased energy 3 - - - 1  Change in appetite 0 - - - 0  Feeling bad or failure about yourself  1 - - - 0  Trouble concentrating 0 - - - 0  Moving slowly or fidgety/restless 1 - - - 0  Suicidal thoughts 0 - - - 0  PHQ-9 Score 10 - - - 5  Difficult doing work/chores Not difficult at all - - - Somewhat difficult     Fall Risk  03/13/2020 12/13/2019 10/11/2019 09/13/2019 08/16/2019  Falls in the past year? 0 0 0 0 0  Number falls in past yr: 0 0 0 0 0  Injury with Fall? 0 0 0 0 0  Follow up - Falls evaluation completed - - -     PHYSICAL EXAM: BP 131/86   Pulse 92   Temp 98.2 F (36.8 C)   Ht 5\' 6"  (1.676 m)   Wt 163 lb 12.8 oz (74.3 kg)   SpO2 96%   BMI 26.44 kg/m   Wt Readings from Last 3 Encounters:  03/13/20 168 lb (76.2 kg)  01/02/20 170 lb (77.1 kg)  12/13/19 171 lb (77.6 kg)     Hearing Screening   125Hz  250Hz  500Hz  1000Hz  2000Hz  3000Hz  4000Hz  6000Hz  8000Hz   Right ear:           Left ear:             Visual Acuity Screening   Right eye Left eye Both eyes  Without correction:     With correction: 20/25 20/20 20/20   denies hearing changes  Physical Exam Vitals and nursing note reviewed. Exam conducted with a chaperone present.  Constitutional:      General: She is not in acute distress.  Appearance: Normal appearance.  HENT:     Head: Normocephalic and atraumatic.     Right Ear: Tympanic membrane, ear canal and external ear normal.     Left Ear: Tympanic membrane, ear canal and external ear normal. There is impacted cerumen.  Eyes:     Extraocular Movements: Extraocular movements intact.     Conjunctiva/sclera: Conjunctivae normal.     Pupils: Pupils are equal, round, and reactive to light.  Cardiovascular:     Rate and Rhythm: Normal rate and regular rhythm.     Pulses: Normal pulses.     Heart sounds: No  murmur heard.  No friction rub. No gallop.   Pulmonary:     Effort: Pulmonary effort is normal. No respiratory distress.     Breath sounds: Normal breath sounds. No wheezing, rhonchi or rales.  Chest:     Breasts:        Right: Normal. No mass, nipple discharge or skin change.        Left: Normal. No mass, nipple discharge or skin change.  Abdominal:     General: Bowel sounds are normal. There is no distension.     Palpations: Abdomen is soft. There is no mass.     Tenderness: There is no abdominal tenderness.     Hernia: No hernia is present.  Musculoskeletal:        General: Normal range of motion.     Cervical back: Normal range of motion.     Right lower leg: No edema.     Left lower leg: No edema.  Lymphadenopathy:     Cervical: No cervical adenopathy.     Upper Body:     Right upper body: No supraclavicular, axillary or pectoral adenopathy.     Left upper body: No supraclavicular, axillary or pectoral adenopathy.  Skin:    General: Skin is warm and dry.  Neurological:     General: No focal deficit present.     Mental Status: She is alert and oriented to person, place, and time.     Cranial Nerves: No cranial nerve deficit.     Motor: No weakness.     Gait: Gait normal.     Deep Tendon Reflexes: Reflexes normal.  Psychiatric:        Mood and Affect: Mood normal.        Behavior: Behavior normal.       Results for orders placed or performed in visit on 04/30/20 (from the past 24 hour(s))  POCT urinalysis dipstick     Status: Abnormal   Collection Time: 04/30/20 11:01 AM  Result Value Ref Range   Color, UA yellow yellow   Clarity, UA clear clear   Glucose, UA negative negative mg/dL   Bilirubin, UA negative negative   Ketones, POC UA negative negative mg/dL   Spec Grav, UA 1.020 1.010 - 1.025   Blood, UA negative negative   pH, UA 7.0 5.0 - 8.0   Protein Ur, POC negative negative mg/dL   Urobilinogen, UA 0.2 0.2 or 1.0 E.U./dL   Nitrite, UA Negative Negative     Leukocytes, UA Trace (A) Negative    Education/Counseling provided regarding diet and exercise, prevention of chronic diseases, smoking/tobacco cessation if appropriate, and reviewed "Covered Medicare Preventive Services"  ASSESSMENT/PLAN:  1. Encounter for Medicare annual wellness exam Education/Counseling provided regarding diet and exercise, prevention of chronic diseases, smoking/tobacco cessation if appropriate, and reviewed "Covered Medicare Preventive Services"  2. Pneumonia of right lung due to infectious organism,  unspecified part of lung Has completed abx, clinically improving. Recheck xray at next OV rx for diflucan given  3. Screening for blood or protein in urine - POCT urinalysis dipstick - neg  Other orders - VITAMIN E PO; Take by mouth. - fluconazole (DIFLUCAN) 150 MG tablet; Take 1 tablet (150 mg total) by mouth once for 1 dose.

## 2020-04-30 NOTE — Patient Instructions (Addendum)
   If you have lab work done today you will be contacted with your lab results within the next 2 weeks.  If you have not heard from us then please contact us. The fastest way to get your results is to register for My Chart.   IF you received an x-ray today, you will receive an invoice from [facility]. Please contact [facility] at [phone] with questions or concerns regarding your invoice.   IF you received labwork today, you will receive an invoice from [facility]. Please contact [facility] at [phone] with questions or concerns regarding your invoice.   Our billing staff will not be able to assist you with questions regarding bills from these companies.  You will be contacted with the lab results as soon as they are available. The fastest way to get your results is to activate your My Chart account. Instructions are located on the last page of this paperwork. If you have not heard from us regarding the results in 2 weeks, please contact this office.     Preventive Care 40-64 Years Old, Female Preventive care refers to visits with your health care provider and lifestyle choices that can promote health and wellness. This includes:  A yearly physical exam. This may also be called an annual well check.  Regular dental visits and eye exams.  Immunizations.  Screening for certain conditions.  Healthy lifestyle choices, such as eating a healthy diet, getting regular exercise, not using drugs or products that contain nicotine and tobacco, and limiting alcohol use. What can I expect for my preventive care visit? Physical exam Your health care provider will check your:  Height and weight. This may be used to calculate body mass index (BMI), which tells if you are at a healthy weight.  Heart rate and blood pressure.  Skin for abnormal spots. Counseling Your health care provider may ask you questions about your:  Alcohol, tobacco, and drug use.  Emotional  well-being.  Home and relationship well-being.  Sexual activity.  Eating habits.  Work and work environment.  Method of birth control.  Menstrual cycle.  Pregnancy history. What immunizations do I need?  Influenza (flu) vaccine  This is recommended every year. Tetanus, diphtheria, and pertussis (Tdap) vaccine  You may need a Td booster every 10 years. Varicella (chickenpox) vaccine  You may need this if you have not been vaccinated. Zoster (shingles) vaccine  You may need this after age 60. Measles, mumps, and rubella (MMR) vaccine  You may need at least one dose of MMR if you were born in 1957 or later. You may also need a second dose. Pneumococcal conjugate (PCV13) vaccine  You may need this if you have certain conditions and were not previously vaccinated. Pneumococcal polysaccharide (PPSV23) vaccine  You may need one or two doses if you smoke cigarettes or if you have certain conditions. Meningococcal conjugate (MenACWY) vaccine  You may need this if you have certain conditions. Hepatitis A vaccine  You may need this if you have certain conditions or if you travel or work in places where you may be exposed to hepatitis A. Hepatitis B vaccine  You may need this if you have certain conditions or if you travel or work in places where you may be exposed to hepatitis B. Haemophilus influenzae type b (Hib) vaccine  You may need this if you have certain conditions. Human papillomavirus (HPV) vaccine  If recommended by your health care provider, you may need three doses over 6 months.   You may receive vaccines as individual doses or as more than one vaccine together in one shot (combination vaccines). Talk with your health care provider about the risks and benefits of combination vaccines. What tests do I need? Blood tests  Lipid and cholesterol levels. These may be checked every 5 years, or more frequently if you are over 50 years old.  Hepatitis C  test.  Hepatitis B test. Screening  Lung cancer screening. You may have this screening every year starting at age 60 if you have a 30-pack-year history of smoking and currently smoke or have quit within the past 15 years.  Colorectal cancer screening. All adults should have this screening starting at age 60 and continuing until age 75. Your health care provider may recommend screening at age 45 if you are at increased risk. You will have tests every 1-10 years, depending on your results and the type of screening test.  Diabetes screening. This is done by checking your blood sugar (glucose) after you have not eaten for a while (fasting). You may have this done every 1-3 years.  Mammogram. This may be done every 1-2 years. Talk with your health care provider about when you should start having regular mammograms. This may depend on whether you have a family history of breast cancer.  BRCA-related cancer screening. This may be done if you have a family history of breast, ovarian, tubal, or peritoneal cancers.  Pelvic exam and Pap test. This may be done every 3 years starting at age 60. Starting at age 60, this may be done every 5 years if you have a Pap test in combination with an HPV test. Other tests  Sexually transmitted disease (STD) testing.  Bone density scan. This is done to screen for osteoporosis. You may have this scan if you are at high risk for osteoporosis. Follow these instructions at home: Eating and drinking  Eat a diet that includes fresh fruits and vegetables, whole grains, lean protein, and low-fat dairy.  Take vitamin and mineral supplements as recommended by your health care provider.  Do not drink alcohol if: ? Your health care provider tells you not to drink. ? You are pregnant, may be pregnant, or are planning to become pregnant.  If you drink alcohol: ? Limit how much you have to 0-1 drink a day. ? Be aware of how much alcohol is in your drink. In the [place] one  drink equals one 12 oz bottle of beer (355 mL), one 5 oz glass of wine (148 mL), or one 1 oz glass of hard liquor (44 mL). Lifestyle  Take daily care of your teeth and gums.  Stay active. Exercise for at least 30 minutes on 5 or more days each week.  Do not use any products that contain nicotine or tobacco, such as cigarettes, e-cigarettes, and chewing tobacco. If you need help quitting, ask your health care provider.  If you are sexually active, practice safe sex. Use a condom or other form of birth control (contraception) in order to prevent pregnancy and STIs (sexually transmitted infections).  If told by your health care provider, take low-dose aspirin daily starting at age 60. What's next?  Visit your health care provider once a year for a well check visit.  Ask your health care provider how often you should have your eyes and teeth checked.  Stay up to date on all vaccines. This information is not intended to replace advice given to you by your health care provider. Make sure   you discuss any questions you have with your health care provider. Document Revised: 04/05/2018 Document Reviewed: 04/05/2018 Elsevier Patient Education  2020 [employer] Inc[employer]  

## 2020-05-22 ENCOUNTER — Telehealth: Payer: Self-pay | Admitting: Registered Nurse

## 2020-05-22 NOTE — Telephone Encounter (Signed)
Pt report BP issue / her BP has been running high 154/104  The bottom has been higher at times this week   Spoke with clinical if chest pain or dizziness advised pt to go to ER   Will try to schedule you appt

## 2020-05-22 NOTE — Telephone Encounter (Signed)
Pt stable f/u next week continue to monitor advised if above 200/120 or chest pain/dizzienss starts she should be seen ER

## 2020-05-28 DIAGNOSIS — J441 Chronic obstructive pulmonary disease with (acute) exacerbation: Secondary | ICD-10-CM | POA: Diagnosis not present

## 2020-06-01 ENCOUNTER — Ambulatory Visit (INDEPENDENT_AMBULATORY_CARE_PROVIDER_SITE_OTHER): Payer: Medicare HMO | Admitting: Family Medicine

## 2020-06-01 ENCOUNTER — Encounter: Payer: Self-pay | Admitting: Family Medicine

## 2020-06-01 ENCOUNTER — Other Ambulatory Visit: Payer: Self-pay

## 2020-06-01 VITALS — BP 156/97 | HR 98 | Temp 98.4°F | Ht 66.0 in | Wt 165.0 lb

## 2020-06-01 DIAGNOSIS — F17200 Nicotine dependence, unspecified, uncomplicated: Secondary | ICD-10-CM

## 2020-06-01 DIAGNOSIS — Z6826 Body mass index (BMI) 26.0-26.9, adult: Secondary | ICD-10-CM

## 2020-06-01 DIAGNOSIS — I10 Essential (primary) hypertension: Secondary | ICD-10-CM

## 2020-06-01 DIAGNOSIS — R7303 Prediabetes: Secondary | ICD-10-CM | POA: Diagnosis not present

## 2020-06-01 LAB — HEMOGLOBIN A1C
Est. average glucose Bld gHb Est-mCnc: 137 mg/dL
Hgb A1c MFr Bld: 6.4 % — ABNORMAL HIGH (ref 4.8–5.6)

## 2020-06-01 MED ORDER — LISINOPRIL 10 MG PO TABS: 10.0000 mg | ORAL_TABLET | Freq: Every day | ORAL | 3 refills | Status: DC

## 2020-06-01 NOTE — Progress Notes (Signed)
10/25/202111:41 AM  Vicki [name] 1960/12/14, 59 y.o., female [id number]  Chief Complaint  Patient presents with  . Hypertension    reports high readings at home ex:180/114 , fatigued  . Depression    13    HPI:   Patient is a 59 y.o. female with past medical history significant for HLD COPD, vitamin D deficiency, low back pain, colonic polyps, tobacco usewho presents today for routine followup  Last Visit Increased Lexapro to 20mg ,  She was supposed to increase lexapro last visit but did not Added Vistaril PRN for panic attacks Son is in jail and wants money, he has multiple drug issues Trying to get custody of grandkids Has seen a therapist  Diabetes:  metformin Lab Results  Component Value Date   HGBA1C 6.3 (H) 12/13/2019    Osteoarthritis: meloxicam  HLD: Atorvastatin Lab Results  Component Value Date   CHOL 172 12/13/2019   HDL 44 12/13/2019   LDLCALC 106 (H) 12/13/2019   LDLDIRECT 114 (H) 06/11/2010   TRIG 124 12/13/2019   CHOLHDL 3.9 12/13/2019    HTN: Start lisinopril:  Continue taking home BP Goal<140/90 Last home BP: 171/105 Dizziness, headaches, Palpitations about 4 times per year Denies Chest pain,  Follow up in 1 week to adjust meds as needed. Last lab 12/2019  BP Readings from Last 3 Encounters:  06/01/20 (!) 156/97  04/30/20 131/86  04/21/20 (!) 163/77     Depression screen PHQ 2/9 06/01/2020 04/30/2020 03/13/2020  Decreased Interest 3 0 1  Down, Depressed, Hopeless 1 0 1  PHQ - 2 Score 4 0 2  Altered sleeping 1 - 3  Tired, decreased energy 2 - 3  Change in appetite 2 - 0  Feeling bad or failure about yourself  1 - 1  Trouble concentrating 1 - 0  Moving slowly or fidgety/restless 2 - 1  Suicidal thoughts 0 - 0  PHQ-9 Score 13 - 10  Difficult doing work/chores Not difficult at all - Not difficult at all    Fall Risk  06/01/2020 04/30/2020 03/13/2020 12/13/2019 10/11/2019  Falls in the past year? 0 0 0 0 0  Number falls in  past yr: 0 0 0 0 0  Injury with Fall? 0 0 0 0 0  Follow up Falls evaluation completed - - Falls evaluation completed -     Allergies  Allergen Reactions  . Codeine Nausea And Vomiting    Prior to Admission medications   Medication Sig Start Date End Date Taking? Authorizing Provider  albuterol (VENTOLIN HFA) 108 (90 Base) MCG/ACT inhaler  08/06/19   [provider]  amoxicillin-clavulanate (AUGMENTIN) 875-125 MG tablet Take 1 tablet by mouth every 12 (twelve) hours. 04/21/20   Wieters, Hallie C, PA-C  ASPIRIN 81 PO Take by mouth.    [provider]  atorvastatin (LIPITOR) 10 MG tablet Take 1 tablet (10 mg total) by mouth daily. 05/16/19   Rutherford Guys, MD  escitalopram (LEXAPRO) 20 MG tablet Take 1 tablet (20 mg total) by mouth at bedtime. 03/13/20   Rutherford Guys, MD  hydrOXYzine (ATARAX/VISTARIL) 25 MG tablet Take 0.5-1 tablets (12.5-25 mg total) by mouth every 8 (eight) hours as needed for anxiety. 03/13/20   Rutherford Guys, MD  meloxicam (MOBIC) 7.5 MG tablet Take 1 tablet (7.5 mg total) by mouth at bedtime as needed for pain. 03/13/20   Rutherford Guys, MD  metFORMIN (GLUCOPHAGE) 500 MG tablet Take 1 tablet (500 mg total) by mouth 2 (two)  times daily with a meal. 03/13/20   Rutherford Guys, MD  VITAMIN E PO Take by mouth.    [provider]    Past Medical History:  Diagnosis Date  . Acquired hallux valgus of left foot   . Callous ulcer ([place])   . Depression with anxiety   . DEPRESSIVE DISORDER NOT ELSEWHERE CLASSIFIED   . Dyspnea   . Foot deformity, acquired   . Grief reaction   . Myalgia and myositis, unspecified   . Onychomycosis   . Oral thrush   . Rotator cuff syndrome of left shoulder   . Rotator cuff tear, right   . Shortness of breath   . SHOULDER PAIN, RIGHT   . Sleep disorder   . Urinary hesitancy     Past Surgical History:  Procedure Laterality Date  . cryotherphy  1986  . TUBAL LIGATION      Social History   Tobacco Use    . Smoking status: Current Every Day Smoker    Packs/day: 0.50    Types: Cigarettes  . Smokeless tobacco: Never Used  . Tobacco comment: increased smoking again due to stress  Substance Use Topics  . Alcohol use: No    Family History  Problem Relation Age of Onset  . Heart disease Mother   . Diabetes Mother   . Cancer Mother        breast and ovarian  . Stroke Mother   . Breast cancer Mother        diagnosed in her 16's  . Heart disease Father     Review of Systems  Constitutional: Negative for chills, fever and weight loss.  Eyes: Negative for blurred vision and double vision.  Respiratory: Negative for cough, shortness of breath and wheezing.   Cardiovascular: Negative for chest pain and leg swelling.       Palpitations about 4 times per year  Gastrointestinal: Positive for diarrhea. Negative for abdominal pain, blood in stool, constipation, heartburn, nausea and vomiting.  Genitourinary: Positive for frequency. Negative for dysuria and hematuria.  Musculoskeletal: Negative for back pain and joint pain.  Neurological: Negative for weakness.       Occasional Headaches and dizziness when her BP is high  Psychiatric/Behavioral: Negative for suicidal ideas. The patient is nervous/anxious.      OBJECTIVE:  Today's Vitals   06/01/20 1101  BP: (!) 156/97  Pulse: 98  Temp: 98.4 F (36.9 C)  SpO2: 94%  Weight: 165 lb (74.8 kg)  Height: 5\' 6"  (1.676 m)   Body mass index is 26.63 kg/m.   Physical Exam Constitutional:      General: She is not in acute distress.    Appearance: Normal appearance. She is not ill-appearing.  HENT:     Head: Normocephalic.  Cardiovascular:     Rate and Rhythm: Normal rate and regular rhythm.     Pulses: Normal pulses.     Heart sounds: Normal heart sounds. No murmur heard.  No friction rub. No gallop.   Pulmonary:     Effort: Pulmonary effort is normal. No respiratory distress.     Breath sounds: Normal breath sounds. No stridor. No  wheezing, rhonchi or rales.  Abdominal:     General: Bowel sounds are normal.     Palpations: Abdomen is soft.     Tenderness: There is no abdominal tenderness.  Musculoskeletal:     Right lower leg: No edema.     Left lower leg: No edema.  Skin:  General: Skin is warm and dry.  Neurological:     Mental Status: She is alert and oriented to person, place, and time.  Psychiatric:        Mood and Affect: Mood normal.        Behavior: Behavior normal.     No results found for this or any previous visit (from the past 24 hour(s)).  No results found.   ASSESSMENT and PLAN  Problem List Items Addressed This Visit      Other   Prediabetes - Primary   Relevant Orders   Hemoglobin A1c: Will follow op with lab results. Continue with current Metformin dose.    Other Visit Diagnoses    Essential hypertension       Relevant Medications   lisinopril (ZESTRIL) 10 MG tablet Will follow up with home BP readings Goal<140/90. Anxiety/Depression Increased Lexapro to 20mg  per day Will follow up in 4-6 weeks. Tobacco Cessation Discussed cutting down on smoking to decrease anxiety and BP   Body mass index (BMI) 26.0-26.9, adult         Mammogram: 03/25/20 due 1 year Pap: 11/26/2015 (Negative and HPV neg) due: 11/25/2020 COVID: done Colon: Per patient At home test about 2 years ago, did a colonoscopy about 4-5 years ago. Unable to find records Flu: Refuses  Return in about 6 weeks (around 07/13/2020) for Medication follow up.    Huston Foley Nikkia Devoss, FNP-BC Primary Care at [facility] [place], [place] [address] Ph.  [phone] Fax [phone]  Subjective:       Assessment:     Plan:

## 2020-06-01 NOTE — Patient Instructions (Addendum)
With increased dose of Lexapro follow up in 4-6 week to discuss. Continue to take home BP. Goal < 140/90    Hypertension, Adult Hypertension is another name for high blood pressure. High blood pressure forces your heart to work harder to pump blood. This can cause problems over time. There are two numbers in a blood pressure reading. There is a top number (systolic) over a bottom number (diastolic). It is best to have a blood pressure that is below 120/80. Healthy choices can help lower your blood pressure, or you may need medicine to help lower it. What are the causes? The cause of this condition is not known. Some conditions may be related to high blood pressure. What increases the risk?  Smoking.  Having type 2 diabetes mellitus, high cholesterol, or both.  Not getting enough exercise or physical activity.  Being overweight.  Having too much fat, sugar, calories, or salt (sodium) in your diet.  Drinking too much alcohol.  Having long-term (chronic) kidney disease.  Having a family history of high blood pressure.  Age. Risk increases with age.  Race. You may be at higher risk if you are African American.  Gender. Men are at higher risk than women before age 50. After age 86, women are at higher risk than men.  Having obstructive sleep apnea.  Stress. What are the signs or symptoms?  High blood pressure may not cause symptoms. Very high blood pressure (hypertensive crisis) may cause: ? Headache. ? Feelings of worry or nervousness (anxiety). ? Shortness of breath. ? Nosebleed. ? A feeling of being sick to your stomach (nausea). ? Throwing up (vomiting). ? Changes in how you see. ? Very bad chest pain. ? Seizures. How is this treated?  This condition is treated by making healthy lifestyle changes, such as: ? Eating healthy foods. ? Exercising more. ? Drinking less alcohol.  Your health care provider may prescribe medicine if lifestyle changes are not enough  to get your blood pressure under control, and if: ? Your top number is above 130. ? Your bottom number is above 80.  Your personal target blood pressure may vary. Follow these instructions at home: Eating and drinking   If told, follow the DASH eating plan. To follow this plan: ? Fill one half of your plate at each meal with fruits and vegetables. ? Fill one fourth of your plate at each meal with whole grains. Whole grains include whole-wheat pasta, brown rice, and whole-grain bread. ? Eat or drink low-fat dairy products, such as skim milk or low-fat yogurt. ? Fill one fourth of your plate at each meal with low-fat (lean) proteins. Low-fat proteins include fish, chicken without skin, eggs, beans, and tofu. ? Avoid fatty meat, cured and processed meat, or chicken with skin. ? Avoid pre-made or processed food.  Eat less than 1,500 mg of salt each day.  Do not drink alcohol if: ? Your doctor tells you not to drink. ? You are pregnant, may be pregnant, or are planning to become pregnant.  If you drink alcohol: ? Limit how much you use to:  0-1 drink a day for women.  0-2 drinks a day for men. ? Be aware of how much alcohol is in your drink. In the [place] one drink equals one 12 oz bottle of beer (355 mL), one 5 oz glass of wine (148 mL), or one 1 oz glass of hard liquor (44 mL). Lifestyle   Work with your doctor to stay at a healthy  weight or to lose weight. Ask your doctor what the best weight is for you.  Get at least 30 minutes of exercise most days of the week. This may include walking, swimming, or biking.  Get at least 30 minutes of exercise that strengthens your muscles (resistance exercise) at least 3 days a week. This may include lifting weights or doing Pilates.  Do not use any products that contain nicotine or tobacco, such as cigarettes, e-cigarettes, and chewing tobacco. If you need help quitting, ask your doctor.  Check your blood pressure at home as told by your  doctor.  Keep all follow-up visits as told by your doctor. This is important. Medicines  Take over-the-counter and prescription medicines only as told by your doctor. Follow directions carefully.  Do not skip doses of blood pressure medicine. The medicine does not work as well if you skip doses. Skipping doses also puts you at risk for problems.  Ask your doctor about side effects or reactions to medicines that you should watch for. Contact a doctor if you:  Think you are having a reaction to the medicine you are taking.  Have headaches that keep coming back (recurring).  Feel dizzy.  Have swelling in your ankles.  Have trouble with your vision. Get help right away if you:  Get a very bad headache.  Start to feel mixed up (confused).  Feel weak or numb.  Feel faint.  Have very bad pain in your: ? Chest. ? Belly (abdomen).  Throw up more than once.  Have trouble breathing. Summary  Hypertension is another name for high blood pressure.  High blood pressure forces your heart to work harder to pump blood.  For most people, a normal blood pressure is less than 120/80.  Making healthy choices can help lower blood pressure. If your blood pressure does not get lower with healthy choices, you may need to take medicine. This information is not intended to replace advice given to you by your health care provider. Make sure you discuss any questions you have with your health care provider. Document Revised: 04/04/2018 Document Reviewed: 04/04/2018 Elsevier Patient Education  [place].  Smoking Tobacco Information, Adult Smoking tobacco can be harmful to your health. Tobacco contains a poisonous (toxic), colorless chemical called nicotine. Nicotine is addictive. It changes the brain and can make it hard to stop smoking. Tobacco also has other toxic chemicals that can hurt your body and raise your risk of many cancers. How can smoking tobacco affect me? Smoking  tobacco puts you at risk for:  Cancer. Smoking is most commonly associated with lung cancer, but can also lead to cancer in other parts of the body.  Chronic obstructive pulmonary disease (COPD). This is a long-term lung condition that makes it hard to breathe. It also gets worse over time.  High blood pressure (hypertension), heart disease, stroke, or heart attack.  Lung infections, such as pneumonia.  Cataracts. This is when the lenses in the eyes become clouded.  Digestive problems. This may include peptic ulcers, heartburn, and gastroesophageal reflux disease (GERD).  Oral health problems, such as gum disease and tooth loss.  Loss of taste and smell. Smoking can affect your appearance by causing:  Wrinkles.  Yellow or stained teeth, fingers, and fingernails. Smoking tobacco can also affect your social life, because:  It may be challenging to find places to smoke when away from home. Many workplaces, [employer], hotels, and public places are tobacco-free.  Smoking is expensive. This is due  to the cost of tobacco and the long-term costs of treating health problems from smoking.  Secondhand smoke may affect those around you. Secondhand smoke can cause lung cancer, breathing problems, and heart disease. Children of smokers have a higher risk for: ? Sudden infant death syndrome (SIDS). ? Ear infections. ? Lung infections. If you currently smoke tobacco, quitting now can help you:  Lead a longer and healthier life.  Look, smell, breathe, and feel better over time.  Save money.  Protect others from the harms of secondhand smoke. What actions can I take to prevent health problems? Quit smoking   Do not start smoking. Quit if you already do.  Make a plan to quit smoking and commit to it. Look for programs to help you and ask your health care provider for recommendations and ideas.  Set a date and write down all the reasons you want to quit.  Let your friends and family  know you are quitting so they can help and support you. Consider finding friends who also want to quit. It can be easier to quit with someone else, so that you can support each other.  Talk with your health care provider about using nicotine replacement medicines to help you quit, such as gum, lozenges, patches, sprays, or pills.  Do not replace cigarette smoking with electronic cigarettes, which are commonly called e-cigarettes. The safety of e-cigarettes is not known, and some may contain harmful chemicals.  If you try to quit but return to smoking, stay positive. It is common to slip up when you first quit, so take it one day at a time.  Be prepared for cravings. When you feel the urge to smoke, chew gum or suck on hard candy. Lifestyle  Stay busy and take care of your body.  Drink enough fluid to keep your urine pale yellow.  Get plenty of exercise and eat a healthy diet. This can help prevent weight gain after quitting.  Monitor your eating habits. Quitting smoking can cause you to have a larger appetite than when you smoke.  Find ways to relax. Go out with friends or family to a movie or a restaurant where people do not smoke.  Ask your health care provider about having regular tests (screenings) to check for cancer. This may include blood tests, imaging tests, and other tests.  Find ways to manage your stress, such as meditation, yoga, or exercise. Where to find support To get support to quit smoking, consider:  Asking your health care provider for more information and resources.  Taking classes to learn more about quitting smoking.  Looking for local organizations that offer resources about quitting smoking.  Joining a support group for people who want to quit smoking in your local community.  Calling the smokefree.gov counselor helpline: 1-800-Quit-Now 613-693-2805) Where to find more information You may find more information about quitting smoking  from:  HelpGuide.org: www.helpguide.org  https://hall.com/: smokefree.gov  American Lung Association: www.lung.org Contact a health care provider if you:  Have problems breathing.  Notice that your lips, nose, or fingers turn blue.  Have chest pain.  Are coughing up blood.  Feel faint or you pass out.  Have other health changes that cause you to worry. Summary  Smoking tobacco can negatively affect your health, the health of those around you, your finances, and your social life.  Do not start smoking. Quit if you already do. If you need help quitting, ask your health care provider.  Think about joining a support  group for people who want to quit smoking in your local community. There are many effective programs that will help you to quit this behavior. This information is not intended to replace advice given to you by your health care provider. Make sure you discuss any questions you have with your health care provider. Document Revised: 04/19/2019 Document Reviewed: 08/09/2016 Elsevier Patient Education  [employer].   If you have lab work done today you will be contacted with your lab results within the next 2 weeks.  If you have not heard from [place] then please contact us. The fastest way to get your results is to register for My Chart.   IF you received an x-ray today, you will receive an invoice from [facility]. Please contact [facility] at [phone] with questions or concerns regarding your invoice.   IF you received labwork today, you will receive an invoice from [facility]. Please contact [facility] at [phone] with questions or concerns regarding your invoice.   Our billing staff will not be able to assist you with questions regarding bills from these companies.  You will be contacted with the lab results as soon as they are available. The fastest way to get your results is to activate your My Chart account. Instructions are located on the last  page of this paperwork. If you have not heard from [place] regarding the results in 2 weeks, please contact this office.

## 2020-06-02 ENCOUNTER — Other Ambulatory Visit: Payer: Self-pay | Admitting: Family Medicine

## 2020-06-03 ENCOUNTER — Ambulatory Visit: Payer: Self-pay | Admitting: Neurology

## 2020-06-28 DIAGNOSIS — J441 Chronic obstructive pulmonary disease with (acute) exacerbation: Secondary | ICD-10-CM | POA: Diagnosis not present

## 2020-07-10 ENCOUNTER — Encounter: Payer: Medicare HMO | Admitting: Family Medicine

## 2020-07-21 DIAGNOSIS — F17210 Nicotine dependence, cigarettes, uncomplicated: Secondary | ICD-10-CM | POA: Diagnosis not present

## 2020-07-21 DIAGNOSIS — J449 Chronic obstructive pulmonary disease, unspecified: Secondary | ICD-10-CM | POA: Diagnosis not present

## 2020-07-21 DIAGNOSIS — Z7951 Long term (current) use of inhaled steroids: Secondary | ICD-10-CM | POA: Diagnosis not present

## 2020-07-21 DIAGNOSIS — R059 Cough, unspecified: Secondary | ICD-10-CM | POA: Diagnosis not present

## 2020-07-21 DIAGNOSIS — Z87891 Personal history of nicotine dependence: Secondary | ICD-10-CM | POA: Diagnosis not present

## 2020-07-22 ENCOUNTER — Telehealth: Payer: Self-pay | Admitting: Family Medicine

## 2020-07-22 NOTE — Telephone Encounter (Signed)
07/22/2020 - PATIENT WAS ON DR. Vonna Kotyk WAITING LIST IF WE HAD ANY CANCELLATIONS. I HAD A CANCELLATION FOR Thursday 07/23/2020 AT 9:00 am THAT WAS FOR A PHYSICAL. I CALLED Taralynn Laughman TO SEE IF SHE WANTED TO USE IT FOR HER TRANSFER OF CARE WITH DR. Carlota Raspberry. SHE WAS ORIGINALLY SCHEDULED FOR 10/14/2020. I HAD TO LEAVE HER A VOICE MAIL TO RETURN MY CALL. HOPEFULLY THE APPOINTMENT WILL STILL BE AVAILABLE. [place]

## 2020-07-24 ENCOUNTER — Other Ambulatory Visit: Payer: Self-pay | Admitting: General Practice

## 2020-07-24 NOTE — Telephone Encounter (Signed)
Medication: meloxicam (MOBIC) 7.5 MG tablet [[id number]]   Has the patient contacted their pharmacy? YES  (Agent: If no, request that the patient contact the pharmacy for the refill.) (Agent: If yes, when and what did the pharmacy advise?)  Preferred Pharmacy (with phone number or street name): [place] ([place]), [place] - [address] [place]) [place] [address] Phone: [phone] Fax: [phone] Hours: Not open 24 hours    Agent: Please be advised that RX refills may take up to 3 business days. We ask that you follow-up with your pharmacy.

## 2020-07-24 NOTE — Telephone Encounter (Signed)
   Notes to clinic:  Medication last filled by Dr. Pamella Pert  Review for refill   Requested Prescriptions  Pending Prescriptions Disp Refills   meloxicam (MOBIC) 7.5 MG tablet 30 tablet 3    Sig: Take 1 tablet (7.5 mg total) by mouth at bedtime as needed for pain.      Analgesics:  COX2 Inhibitors Failed - 07/24/2020 11:01 AM      Failed - HGB in normal range and within 360 days    Hemoglobin  Date Value Ref Range Status  05/16/2019 13.5 11.1 - 15.9 g/dL Final          Passed - Cr in normal range and within 360 days    Creat  Date Value Ref Range Status  01/11/2016 0.53 0.50 - 1.05 mg/dL Final   Creatinine, Ser  Date Value Ref Range Status  12/13/2019 0.59 0.57 - 1.00 mg/dL Final          Passed - Patient is not pregnant      Passed - Valid encounter within last 12 months    Recent Outpatient Visits           1 month ago Prediabetes   [facility] at [facility], Laurita Quint, [place]   2 months ago Encounter for [employer] annual wellness exam   Primary Care at [facility], Lilia Argue, MD   4 months ago Depression with anxiety   Primary Care at [facility], Lilia Argue, MD   7 months ago GAD (generalized anxiety disorder)   Primary Care at [facility], Lilia Argue, MD   9 months ago GAD (generalized anxiety disorder)   Primary Care at [facility], Lilia Argue, MD       Future Appointments             In 2 months Carlota Raspberry Ranell Patrick, MD Primary Care at [place], [facility]

## 2020-07-27 MED ORDER — MELOXICAM 7.5 MG PO TABS: 7.5000 mg | ORAL_TABLET | Freq: Every evening | ORAL | 3 refills | Status: DC | PRN

## 2020-07-27 NOTE — Telephone Encounter (Signed)
Patient is requesting a refill of the following medications: Requested Prescriptions   Pending Prescriptions Disp Refills   meloxicam (MOBIC) 7.5 MG tablet 30 tablet 3    Sig: Take 1 tablet (7.5 mg total) by mouth at bedtime as needed for pain.    Date of patient request: 07/27/20 Last office visit: 06/01/20 Date of last refill: 03/13/20 Last refill amount: #30 +3 Follow up time period per chart: 10/14/20

## 2020-07-28 DIAGNOSIS — J441 Chronic obstructive pulmonary disease with (acute) exacerbation: Secondary | ICD-10-CM | POA: Diagnosis not present

## 2020-08-12 NOTE — Telephone Encounter (Signed)
Received this notice from [facility] when asked if pt has been set up yet: "She has not - she no-showed to her appt in October and is on our list to be rescheduled. Will reach out to pt again."

## 2020-08-12 NOTE — Telephone Encounter (Signed)
I called pt to discuss. No answer, left a message asking her to call me back.  If pt calls back, please ask her if she plans to start cpap. If so, we will need to postpone her follow up until after she starts it. Please postpone appt for about 2 more months and encourage pt to call [facility] back at [phone] to get her cpap appt scheduled.

## 2020-08-13 ENCOUNTER — Telehealth: Payer: Self-pay | Admitting: *Deleted

## 2020-08-13 ENCOUNTER — Ambulatory Visit: Payer: Self-pay | Admitting: Neurology

## 2020-08-13 ENCOUNTER — Encounter: Payer: Self-pay | Admitting: Neurology

## 2020-08-13 ENCOUNTER — Ambulatory Visit: Payer: Medicare HMO | Admitting: Neurology

## 2020-08-13 NOTE — Telephone Encounter (Signed)
Patient was no show for follow up today. 

## 2020-09-11 ENCOUNTER — Other Ambulatory Visit: Payer: Self-pay | Admitting: Family Medicine

## 2020-09-11 MED ORDER — ATORVASTATIN CALCIUM 10 MG PO TABS: 10.0000 mg | ORAL_TABLET | Freq: Every day | ORAL | 0 refills | Status: DC

## 2020-09-11 NOTE — Telephone Encounter (Signed)
Copied from [place] [id number]. Topic: Quick Communication - Rx Refill/Question >> Sep 11, 2020  2:54 PM Tessa Lerner A wrote: Medication: atorvastatin (LIPITOR) 10 MG   Has the patient contacted their pharmacy? No patient has not contacted pharmacy  Preferred Pharmacy (with phone number or street name): [place] ([place]), [place] - [address]  Phone:  [phone]  Agent: Please be advised that RX refills may take up to 3 business days. We ask that you follow-up with your pharmacy.

## 2020-10-14 ENCOUNTER — Encounter: Payer: Self-pay | Admitting: Family Medicine

## 2020-10-14 ENCOUNTER — Ambulatory Visit
Admission: RE | Admit: 2020-10-14 | Discharge: 2020-10-14 | Disposition: A | Discharge status: Home / Self Care | Payer: 59 | Source: Ambulatory Visit | Attending: Family Medicine | Admitting: Family Medicine

## 2020-10-14 ENCOUNTER — Other Ambulatory Visit: Payer: Self-pay

## 2020-10-14 ENCOUNTER — Ambulatory Visit (INDEPENDENT_AMBULATORY_CARE_PROVIDER_SITE_OTHER): Payer: 59 | Admitting: Family Medicine

## 2020-10-14 VITALS — BP 131/80 | HR 96 | Temp 98.3°F | Ht 66.0 in | Wt 162.0 lb

## 2020-10-14 DIAGNOSIS — M5431 Sciatica, right side: Secondary | ICD-10-CM

## 2020-10-14 DIAGNOSIS — M791 Myalgia, unspecified site: Secondary | ICD-10-CM

## 2020-10-14 DIAGNOSIS — E785 Hyperlipidemia, unspecified: Secondary | ICD-10-CM

## 2020-10-14 DIAGNOSIS — G4733 Obstructive sleep apnea (adult) (pediatric): Secondary | ICD-10-CM

## 2020-10-14 DIAGNOSIS — M25551 Pain in right hip: Secondary | ICD-10-CM

## 2020-10-14 DIAGNOSIS — I10 Essential (primary) hypertension: Secondary | ICD-10-CM | POA: Diagnosis not present

## 2020-10-14 DIAGNOSIS — R7303 Prediabetes: Secondary | ICD-10-CM | POA: Diagnosis not present

## 2020-10-14 DIAGNOSIS — M5441 Lumbago with sciatica, right side: Secondary | ICD-10-CM

## 2020-10-14 DIAGNOSIS — F411 Generalized anxiety disorder: Secondary | ICD-10-CM

## 2020-10-14 DIAGNOSIS — N200 Calculus of kidney: Secondary | ICD-10-CM

## 2020-10-14 DIAGNOSIS — Z9989 Dependence on other enabling machines and devices: Secondary | ICD-10-CM

## 2020-10-14 DIAGNOSIS — F418 Other specified anxiety disorders: Secondary | ICD-10-CM | POA: Diagnosis not present

## 2020-10-14 DIAGNOSIS — R0609 Other forms of dyspnea: Secondary | ICD-10-CM

## 2020-10-14 MED ORDER — CYCLOBENZAPRINE HCL 5 MG PO TABS: ORAL_TABLET | ORAL | 0 refills | Status: DC

## 2020-10-14 MED ORDER — ESCITALOPRAM OXALATE 20 MG PO TABS: 20.0000 mg | ORAL_TABLET | Freq: Every day | ORAL | 1 refills | Status: DC

## 2020-10-14 NOTE — Patient Instructions (Addendum)
I do recommend meeting with a therapist. Ok to continue lexapro same dose for now. Hydroxyzine if needed.   Here are a few options for counseling:  [facility]:  [place] [phone]  I will try to get notes from your lung doctor about the EKG question.   Xray at [place]. [place]   Flexeril if needed for spasm in leg/back. meloxicam for now is ok, but may need to change medications or possibly have you see a spine specialist if symptoms are not improving. Recheck in next 2 weeks.  Return to the clinic or go to the nearest emergency room if any of your symptoms worsen or new symptoms occur.   Here are a few lab drawing stations for you to have your labwork performed:  [place], [place], [place] [address]  [place], [place] [address]    If you have lab work done today you will be contacted with your lab results within the next 2 weeks.  If you have not heard from [place] then please contact us. The fastest way to get your results is to register for My Chart.   Sciatica  Sciatica is pain, numbness, weakness, or tingling along the path of the sciatic nerve. The sciatic nerve starts in the lower back and runs down the back of each leg. The nerve controls the muscles in the lower leg and in the back of the knee. It also provides feeling (sensation) to the back of the thigh, the lower leg, and the sole of the foot. Sciatica is a symptom of another medical condition that pinches or puts pressure on the sciatic nerve. Sciatica most often only affects one side of the body. Sciatica usually goes away on its own or with treatment. In some cases, sciatica may come back (recur). What are the causes? This condition is caused by pressure on the sciatic nerve or pinching of the nerve. This may be the result of:  A disk in between the bones of the spine bulging out too far  (herniated disk).  Age-related changes in the spinal disks.  A pain disorder that affects a muscle in the buttock.  Extra bone growth near the sciatic nerve.  A break (fracture) of the pelvis.  Pregnancy.  Tumor. This is rare. What increases the risk? The following factors may make you more likely to develop this condition:  Playing sports that place pressure or stress on the spine.  Having poor strength and flexibility.  A history of back injury or surgery.  Sitting for long periods of time.  Doing activities that involve repetitive bending or lifting.  Obesity. What are the signs or symptoms? Symptoms can vary from mild to very severe, and they may include:  Any of these problems in the lower back, leg, hip, or buttock: ? Mild tingling, numbness, or dull aches. ? Burning sensations. ? Sharp pains.  Numbness in the back of the calf or the sole of the foot.  Leg weakness.  Severe back pain that makes movement difficult. Symptoms may get worse when you cough, sneeze, or laugh, or when you sit or stand for long periods of time. How is this diagnosed? This condition may be diagnosed based on:  Your symptoms and medical history.  A physical exam.  Blood tests.  Imaging tests, such as: ? X-rays. ? MRI. ? CT scan. How is this treated? In many cases, this condition improves on  its own without treatment. However, treatment may include:  Reducing or modifying physical activity.  Exercising and stretching.  Icing and applying heat to the affected area.  Medicines that help to: ? Relieve pain and swelling. ? Relax your muscles.  Injections of medicines that help to relieve pain, irritation, and inflammation around the sciatic nerve (steroids).  Surgery. Follow these instructions at home: Medicines  Take over-the-counter and prescription medicines only as told by your health care provider.  Ask your health care provider if the medicine prescribed to  you: ? Requires you to avoid driving or using heavy machinery. ? Can cause constipation. You may need to take these actions to prevent or treat constipation:  Drink enough fluid to keep your urine pale yellow.  Take over-the-counter or prescription medicines.  Eat foods that are high in fiber, such as beans, whole grains, and fresh fruits and vegetables.  Limit foods that are high in fat and processed sugars, such as fried or sweet foods. Managing pain  If directed, put ice on the affected area. ? Put ice in a plastic bag. ? Place a towel between your skin and the bag. ? Leave the ice on for 20 minutes, 2-3 times a day.  If directed, apply heat to the affected area. Use the heat source that your health care provider recommends, such as a moist heat pack or a heating pad. ? Place a towel between your skin and the heat source. ? Leave the heat on for 20-30 minutes. ? Remove the heat if your skin turns bright red. This is especially important if you are unable to feel pain, heat, or cold. You may have a greater risk of getting burned.      Activity  Return to your normal activities as told by your health care provider. Ask your health care provider what activities are safe for you.  Avoid activities that make your symptoms worse.  Take brief periods of rest throughout the day. ? When you rest for longer periods, mix in some mild activity or stretching between periods of rest. This will help to prevent stiffness and pain. ? Avoid sitting for long periods of time without moving. Get up and move around at least one time each hour.  Exercise and stretch regularly, as told by your health care provider.  Do not lift anything that is heavier than 10 lb (4.5 kg) while you have symptoms of sciatica. When you do not have symptoms, you should still avoid heavy lifting, especially repetitive heavy lifting.  When you lift objects, always use proper lifting technique, which includes: ? Bending  your knees. ? Keeping the load close to your body. ? Avoiding twisting.   General instructions  Maintain a healthy weight. Excess weight puts extra stress on your back.  Wear supportive, comfortable shoes. Avoid wearing high heels.  Avoid sleeping on a mattress that is too soft or too hard. A mattress that is firm enough to support your back when you sleep may help to reduce your pain.  Keep all follow-up visits as told by your health care provider. This is important. Contact a health care provider if:  You have pain that: ? Wakes you up when you are sleeping. ? Gets worse when you lie down. ? Is worse than you have experienced in the past. ? Lasts longer than 4 weeks.  You have an unexplained weight loss. Get help right away if:  You are not able to control when you urinate or have bowel  movements (incontinence).  You have: ? Weakness in your lower back, pelvis, buttocks, or legs that gets worse. ? Redness or swelling of your back. ? A burning sensation when you urinate. Summary  Sciatica is pain, numbness, weakness, or tingling along the path of the sciatic nerve.  This condition is caused by pressure on the sciatic nerve or pinching of the nerve.  Sciatica can cause pain, numbness, or tingling in the lower back, legs, hips, and buttocks.  Treatment often includes rest, exercise, medicines, and applying ice or heat. This information is not intended to replace advice given to you by your health care provider. Make sure you discuss any questions you have with your health care provider. Document Revised: 08/13/2018 Document Reviewed: 08/13/2018 Elsevier Patient Education  2021 [place].  IF you received an x-ray today, you will receive an invoice from [facility]. Please contact [facility] at [phone] with questions or concerns regarding your invoice.   IF you received labwork today, you will receive an invoice from [place]. Please contact  [facility] at [phone] with questions or concerns regarding your invoice.   Our billing staff will not be able to assist you with questions regarding bills from these companies.  You will be contacted with the lab results as soon as they are available. The fastest way to get your results is to activate your My Chart account. Instructions are located on the last page of this paperwork. If you have not heard from [place] regarding the results in 2 weeks, please contact this office.

## 2020-10-14 NOTE — Progress Notes (Signed)
Subjective:  Patient ID: Vicki [name], female    DOB: Mar 31, 1961  Age: 60 y.o. MRN: [id number]  CC:  Chief Complaint  Patient presents with  . Transitions Of Care    Pt reports she feels pretty good.    HPI Avonda Toso presents for   Transition of care, previous patient of Dr. Pamella Pert.  History of hyperlipidemia, COPD, vitamin D deficiency, prediabetes, Seen in October 2021 by Huston Foley Just, NP.   Generalized Anxiety: Discussed in October.  Previously had recommended higher dose of Lexapro, dosage was changed to 20 mg at that time.  Hydroxyzine if needed for panic/anxiety attacks. Not taking hydroxy much - causes sleepiness. Helps to get sleep.  Higher dose  Not meeting with therapist- had one in past.  Better on higher dose of Lexapro - anxiety attack 2 times per week.  Hyperventilates. Trying breathing exercises.  Has some family, situational stressors.   Depression screen [facility] 2/9 10/14/2020 06/01/2020 04/30/2020 03/13/2020 12/13/2019  Decreased Interest 1 3 0 1 0  Down, Depressed, Hopeless 1 1 0 1 0  PHQ - 2 Score 2 4 0 2 0  Altered sleeping 3 1 - 3 -  Tired, decreased energy 3 2 - 3 -  Change in appetite 2 2 - 0 -  Feeling bad or failure about yourself  0 1 - 1 -  Trouble concentrating 0 1 - 0 -  Moving slowly or fidgety/restless 3 2 - 1 -  Suicidal thoughts 0 0 - 0 -  PHQ-9 Score 13 13 - 10 -  Difficult doing work/chores - Not difficult at all - Not difficult at all -   Hypertension: Lisinopril 10 mg daily Home readings: 125/83. No new side effects.  Exercising past few months,  BP Readings from Last 3 Encounters:  10/14/20 131/80  06/01/20 (!) 156/97  04/30/20 131/86   Lab Results  Component Value Date   CREATININE 0.59 12/13/2019    Hyperlipidemia, leg pains Lipitor 10 mg daily. Some cramps after starting exercising past few months. Low back/buttock, pain down leg at times - after exercising few weeks ago. Marland Kitchentx: tylenol, mobic QD Pain shoots down R  leg past few weeks.  Felt a pop into the buttock area when this first occurred. No recent mm relaxer, taken in past.  No bowel or bladder incontinence, no saddle anesthesia, no lower extremity weakness. Hurts to push on gas, chronic back pain.    Lab Results  Component Value Date   CHOL 172 12/13/2019   HDL 44 12/13/2019   LDLCALC 106 (H) 12/13/2019   LDLDIRECT 114 (H) 06/11/2010   TRIG 124 12/13/2019   CHOLHDL 3.9 12/13/2019   Lab Results  Component Value Date   ALT 13 12/13/2019   AST 13 12/13/2019   ALKPHOS 92 12/13/2019   BILITOT <0.2 12/13/2019    COPD: Has albuterol as needed,  Taking symbicort - followed by pulmonary - [place].  Reports that pulmonary recommend heart eval as possible contributor to her DOE. No CP.  Some chronic dyspnea. No CP with exercise.   Obstructive sleep apnea Severe based on home sleep test in August.  AHI 54.  Has been using CPAP nightly.  Still tired at times, less sluggish in am.   Prediabetes: Has been treated with Metformin 500 mg - increased exercise. Taking metformin QD.  Lab Results  Component Value Date   HGBA1C 6.4 (H) 06/01/2020   Wt Readings from Last 3 Encounters:  10/14/20 162 lb (73.5 kg)  06/01/20 165 lb (74.8 kg)  04/30/20 163 lb 12.8 oz (74.3 kg)      History Patient Active Problem List   Diagnosis Date Noted  . Influenza A 09/26/2018  . COPD with acute exacerbation ([place]) 09/26/2018  . Neck pain 02/05/2016  . Leg swelling 01/19/2016  . Dry cough 02/11/2015  . Fatigue 02/11/2015  . Prediabetes 10/29/2014  . Polyuria 10/28/2014  . Hyperglycemia 10/28/2014  . Left breast mass 04/11/2014  . Right hip pain 03/21/2014  . Edema, lower extremity 03/21/2014  . Low back pain 12/17/2013  . Right knee pain 12/17/2013  . Chest pain, central 11/05/2013  . Lateral epicondylitis of left elbow 11/05/2013  . Obstructive chronic bronchitis without exacerbation ([place]) 08/23/2013  . Wrist fracture, right 07/08/2013  .  Sciatica 05/28/2013  . Grief reaction 04/02/2013  . Myalgia and myositis, unspecified 03/07/2012  . Rotator cuff syndrome of left shoulder 03/07/2012  . Depression with anxiety 03/07/2012  . Foot deformity, acquired 06/21/2011  . Callous ulcer ([place]) 06/21/2011  . Acquired hallux valgus of left foot 05/10/2011  . Sleep disorder 02/04/2011  . Rotator cuff tear, right 09/03/2010  . SHOULDER PAIN, RIGHT 08/20/2010  . TOBACCO ABUSE 06/11/2010  . DEPRESSIVE DISORDER NOT ELSEWHERE CLASSIFIED 06/11/2010   Past Medical History:  Diagnosis Date  . Acquired hallux valgus of left foot   . Callous ulcer ([place])   . Depression with anxiety   . DEPRESSIVE DISORDER NOT ELSEWHERE CLASSIFIED   . Diabetes mellitus without complication ([place])    Phreesia 10/13/2020  . Dyspnea   . Foot deformity, acquired   . Grief reaction   . Hyperlipidemia    Phreesia 10/13/2020  . Hypertension    Phreesia 10/13/2020  . Myalgia and myositis, unspecified   . Onychomycosis   . Oral thrush   . Rotator cuff syndrome of left shoulder   . Rotator cuff tear, right   . Shortness of breath   . SHOULDER PAIN, RIGHT   . Sleep apnea    Phreesia 10/13/2020  . Sleep disorder   . Urinary hesitancy    Past Surgical History:  Procedure Laterality Date  . cryotherphy  1986  . TUBAL LIGATION     Allergies  Allergen Reactions  . Other   . Codeine Nausea And Vomiting   Prior to Admission medications   Medication Sig Start Date End Date Taking? Authorizing Provider  albuterol (VENTOLIN HFA) 108 (90 Base) MCG/ACT inhaler  08/06/19  Yes [provider]  ASPIRIN 81 PO Take by mouth.   Yes [provider]  atorvastatin (LIPITOR) 10 MG tablet Take 1 tablet (10 mg total) by mouth daily. 09/11/20  Yes Rhetta Mura, MD  escitalopram (LEXAPRO) 20 MG tablet Take 1 tablet (20 mg total) by mouth at bedtime. 03/13/20  Yes Jacelyn Pi, Lilia Argue, MD  hydrOXYzine (ATARAX/VISTARIL) 25 MG tablet Take 0.5-1 tablets  (12.5-25 mg total) by mouth every 8 (eight) hours as needed for anxiety. 03/13/20  Yes Jacelyn Pi, Lilia Argue, MD  lisinopril (ZESTRIL) 10 MG tablet Take 1 tablet (10 mg total) by mouth daily. 06/01/20  Yes Just, Laurita Quint, FNP  meloxicam (MOBIC) 7.5 MG tablet Take 1 tablet (7.5 mg total) by mouth at bedtime as needed for pain. 07/27/20  Yes Just, Laurita Quint, FNP  metFORMIN (GLUCOPHAGE) 500 MG tablet Take 1 tablet (500 mg total) by mouth 2 (two) times daily with a meal. 03/13/20  Yes Jacelyn Pi, Lilia Argue, MD  VITAMIN E PO Take  by mouth.   Yes [provider]   Social History   Socioeconomic History  . Marital status: Widowed    Spouse name: Not on file  . Number of children: Not on file  . Years of education: Not on file  . Highest education level: Not on file  Occupational History  . Not on file  Tobacco Use  . Smoking status: Current Every Day Smoker    Packs/day: 0.50    Types: Cigarettes  . Smokeless tobacco: Never Used  . Tobacco comment: increased smoking again due to stress  Substance and Sexual Activity  . Alcohol use: No  . Drug use: No  . Sexual activity: Never    Birth control/protection: Surgical, Post-menopausal  Other Topics Concern  . Not on file  Social History Narrative  . Not on file   Social Determinants of Health   Financial Resource Strain: Not on file  Food Insecurity: Not on file  Transportation Needs: Not on file  Physical Activity: Not on file  Stress: Not on file  Social Connections: Not on file  Intimate Partner Violence: Not on file    Review of Systems Per HPI  Objective:   Vitals:   10/14/20 1407  BP: 131/80  Pulse: 96  Temp: 98.3 F (36.8 C)  TempSrc: Temporal  SpO2: 91%  Weight: 162 lb (73.5 kg)  Height: 5\' 6"  (1.676 m)     Physical Exam Vitals reviewed.  Constitutional:      Appearance: She is well-developed and well-nourished.  HENT:     Head: Normocephalic and atraumatic.  Eyes:     Extraocular Movements: EOM  normal.     Conjunctiva/sclera: Conjunctivae normal.     Pupils: Pupils are equal, round, and reactive to light.  Neck:     Vascular: No carotid bruit.  Cardiovascular:     Rate and Rhythm: Normal rate and regular rhythm.     Pulses: Intact distal pulses.     Heart sounds: Normal heart sounds.  Pulmonary:     Effort: Pulmonary effort is normal.     Breath sounds: Normal breath sounds.  Abdominal:     Palpations: Abdomen is soft. There is no pulsatile mass.     Tenderness: There is no abdominal tenderness.  Musculoskeletal:     Comments: Lumbar spine no focal bony tenderness.  Ambulating without assistance.  Negative seated straight leg raise but discomfort into her posterior thigh with hip external rotation on the right, slight discomfort into the groin with internal rotation.  Skin:    General: Skin is warm and dry.  Neurological:     Mental Status: She is alert and oriented to person, place, and time.  Psychiatric:        Mood and Affect: Mood and affect normal.        Behavior: Behavior normal.    DG Lumbar Spine 2-3 Views  Result Date: 10/14/2020 CLINICAL DATA:  pop in R upper thigh with exercise 3 weeks ago - pain radiating down. chronic back pain Lumbosacral back pain with right-sided sciatica. EXAM: LUMBAR SPINE - 2-3 VIEW COMPARISON:  None. FINDINGS: Minimal broad-based scoliotic curvature versus positioning. No listhesis. Vertebral body heights are normal. Mild endplate spurring at multiple levels. Mild disc space narrowing at L2-L3. facet hypertrophy at L5-S1. No fracture or bony destruction. There is a 9 mm calcification in the right abdomen to the right of L2 that may represent a renal stone. Aorto bi-iliac atherosclerosis. IMPRESSION: 1. No acute abnormality in the  lumbar spine. 2. Mild degenerative disc disease and lower lumbar facet hypertrophy. 3. Possible 9 mm right renal stone. Electronically Signed   By: Keith Rake M.D.   On: 10/14/2020 16:52   DG HIP UNILAT W OR  W/O PELVIS 2-3 VIEWS RIGHT  Result Date: 10/14/2020 CLINICAL DATA:  pop in R upper thigh with exercise 3 weeks ago - pain radiating down. chronic back pain EXAM: DG HIP (WITH OR WITHOUT PELVIS) 2-3V RIGHT COMPARISON:  Radiograph 04/07/2014 FINDINGS: Right greater than left hip joint space narrowing. Acetabular and femoral head osteophytes. Femoral head is seated in the acetabulum. No evidence of fracture or avascular necrosis. Pubic rami and remainder of the bony pelvis are intact. Pubic symphysis and sacroiliac joints are congruent. Probable bone island in the intertrochanteric right femur, unchanged. IMPRESSION: 1. No acute abnormality. 2. Moderate right hip osteoarthritis. Electronically Signed   By: Keith Rake M.D.   On: 10/14/2020 16:54   Results reviewed on phone 10/15/20  Assessment & Plan:  Oluwateniola Leitch is a 60 y.o. female .  Essential hypertension - Plan: Lipid panel, Comprehensive metabolic panel  -  Stable, tolerating current regimen.  Labs pending as above.   Prediabetes - Plan: Hemoglobin A1c  -Watch diet, low intensity exercise as tolerated once symptoms improved, check A1c.  Depression with anxiety - Plan: escitalopram (LEXAPRO) 20 MG tablet GAD (generalized anxiety disorder)  -.  Better on higher dose Lexapro but still some breakthrough symptoms, recommend a therapist evaluation, phone numbers provided.  Hyperlipidemia, unspecified hyperlipidemia type  -Tolerating current regimen, check labs above  OSA on CPAP  -Severe central sleep apnea, continue CPAP  Right hip pain Right sided sciatica - Plan: DG HIP UNILAT W OR W/O PELVIS 2-3 VIEWS RIGHT, cyclobenzaprine (FLEXERIL) 5 MG tablet Low back pain with right-sided sciatica, unspecified back pain laterality, unspecified chronicity - Plan: DG Lumbar Spine 2-3 Views Myalgia - Plan: CK  -Suspected sciatica, x-rays reassuring as above, symptomatic care with short-term Flexeril, RTC precautions.  Nephrolith - Plan:  Ambulatory referral to Nephrology  -Asymptomatic, eval by nephrology.   Chronic dyspnea - Plan: Ambulatory referral to Cardiology  -COPD but recommendation for cardiac evaluation by her pulmonologist.  Referral placed.  Last echo in 2014 with normal systolic function, EF 55 to 60%.   Meds ordered this encounter  Medications  . escitalopram (LEXAPRO) 20 MG tablet    Sig: Take 1 tablet (20 mg total) by mouth at bedtime.    Dispense:  90 tablet    Refill:  1  . cyclobenzaprine (FLEXERIL) 5 MG tablet    Sig: 1 pill by mouth up to every 8 hours as needed. Start with one pill by mouth each bedtime as needed due to sedation    Dispense:  15 tablet    Refill:  0   Patient Instructions   I do recommend meeting with a therapist. Ok to continue lexapro same dose for now. Hydroxyzine if needed.   Here are a few options for counseling:  [facility]:  [place] [phone]  I will try to get notes from your lung doctor about the EKG question.   Xray at [facility]. [place]   Flexeril if needed for spasm in leg/back. meloxicam for now is ok, but may need to change medications or possibly have you see a spine specialist if symptoms are not improving. Recheck in next 2 weeks.  Return to the clinic or go to the nearest  emergency room if any of your symptoms worsen or new symptoms occur.   Here are a few lab drawing stations for you to have your labwork performed:  [place], [place], [place] [id number]  [facility] [place], [place] [id number]    If you have lab work done today you will be contacted with your lab results within the next 2 weeks.  If you have not heard from [place] then please contact us. The fastest way to get your results is to register for My Chart.   Sciatica  Sciatica is pain, numbness, weakness, or tingling along the path of the sciatic nerve. The sciatic  nerve starts in the lower back and runs down the back of each leg. The nerve controls the muscles in the lower leg and in the back of the knee. It also provides feeling (sensation) to the back of the thigh, the lower leg, and the sole of the foot. Sciatica is a symptom of another medical condition that pinches or puts pressure on the sciatic nerve. Sciatica most often only affects one side of the body. Sciatica usually goes away on its own or with treatment. In some cases, sciatica may come back (recur). What are the causes? This condition is caused by pressure on the sciatic nerve or pinching of the nerve. This may be the result of:  A disk in between the bones of the spine bulging out too far (herniated disk).  Age-related changes in the spinal disks.  A pain disorder that affects a muscle in the buttock.  Extra bone growth near the sciatic nerve.  A break (fracture) of the pelvis.  Pregnancy.  Tumor. This is rare. What increases the risk? The following factors may make you more likely to develop this condition:  Playing sports that place pressure or stress on the spine.  Having poor strength and flexibility.  A history of back injury or surgery.  Sitting for long periods of time.  Doing activities that involve repetitive bending or lifting.  Obesity. What are the signs or symptoms? Symptoms can vary from mild to very severe, and they may include:  Any of these problems in the lower back, leg, hip, or buttock: ? Mild tingling, numbness, or dull aches. ? Burning sensations. ? Sharp pains.  Numbness in the back of the calf or the sole of the foot.  Leg weakness.  Severe back pain that makes movement difficult. Symptoms may get worse when you cough, sneeze, or laugh, or when you sit or stand for long periods of time. How is this diagnosed? This condition may be diagnosed based on:  Your symptoms and medical history.  A physical exam.  Blood tests.  Imaging tests,  such as: ? X-rays. ? MRI. ? CT scan. How is this treated? In many cases, this condition improves on its own without treatment. However, treatment may include:  Reducing or modifying physical activity.  Exercising and stretching.  Icing and applying heat to the affected area.  Medicines that help to: ? Relieve pain and swelling. ? Relax your muscles.  Injections of medicines that help to relieve pain, irritation, and inflammation around the sciatic nerve (steroids).  Surgery. Follow these instructions at home: Medicines  Take over-the-counter and prescription medicines only as told by your health care provider.  Ask your health care provider if the medicine prescribed to you: ? Requires you to avoid driving or using heavy machinery. ? Can cause constipation. You may need to  take these actions to prevent or treat constipation:  Drink enough fluid to keep your urine pale yellow.  Take over-the-counter or prescription medicines.  Eat foods that are high in fiber, such as beans, whole grains, and fresh fruits and vegetables.  Limit foods that are high in fat and processed sugars, such as fried or sweet foods. Managing pain  If directed, put ice on the affected area. ? Put ice in a plastic bag. ? Place a towel between your skin and the bag. ? Leave the ice on for 20 minutes, 2-3 times a day.  If directed, apply heat to the affected area. Use the heat source that your health care provider recommends, such as a moist heat pack or a heating pad. ? Place a towel between your skin and the heat source. ? Leave the heat on for 20-30 minutes. ? Remove the heat if your skin turns bright red. This is especially important if you are unable to feel pain, heat, or cold. You may have a greater risk of getting burned.      Activity  Return to your normal activities as told by your health care provider. Ask your health care provider what activities are safe for you.  Avoid activities  that make your symptoms worse.  Take brief periods of rest throughout the day. ? When you rest for longer periods, mix in some mild activity or stretching between periods of rest. This will help to prevent stiffness and pain. ? Avoid sitting for long periods of time without moving. Get up and move around at least one time each hour.  Exercise and stretch regularly, as told by your health care provider.  Do not lift anything that is heavier than 10 lb (4.5 kg) while you have symptoms of sciatica. When you do not have symptoms, you should still avoid heavy lifting, especially repetitive heavy lifting.  When you lift objects, always use proper lifting technique, which includes: ? Bending your knees. ? Keeping the load close to your body. ? Avoiding twisting.   General instructions  Maintain a healthy weight. Excess weight puts extra stress on your back.  Wear supportive, comfortable shoes. Avoid wearing high heels.  Avoid sleeping on a mattress that is too soft or too hard. A mattress that is firm enough to support your back when you sleep may help to reduce your pain.  Keep all follow-up visits as told by your health care provider. This is important. Contact a health care provider if:  You have pain that: ? Wakes you up when you are sleeping. ? Gets worse when you lie down. ? Is worse than you have experienced in the past. ? Lasts longer than 4 weeks.  You have an unexplained weight loss. Get help right away if:  You are not able to control when you urinate or have bowel movements (incontinence).  You have: ? Weakness in your lower back, pelvis, buttocks, or legs that gets worse. ? Redness or swelling of your back. ? A burning sensation when you urinate. Summary  Sciatica is pain, numbness, weakness, or tingling along the path of the sciatic nerve.  This condition is caused by pressure on the sciatic nerve or pinching of the nerve.  Sciatica can cause pain, numbness, or  tingling in the lower back, legs, hips, and buttocks.  Treatment often includes rest, exercise, medicines, and applying ice or heat. This information is not intended to replace advice given to you by your health care provider. Make sure  you discuss any questions you have with your health care provider. Document Revised: 08/13/2018 Document Reviewed: 08/13/2018 Elsevier Patient Education  2021 [place].  IF you received an x-ray today, you will receive an invoice from [facility]. Please contact [facility] at [phone] with questions or concerns regarding your invoice.   IF you received labwork today, you will receive an invoice from [facility]. Please contact [facility] at [phone] with questions or concerns regarding your invoice.   Our billing staff will not be able to assist you with questions regarding bills from these companies.  You will be contacted with the lab results as soon as they are available. The fastest way to get your results is to activate your My Chart account. Instructions are located on the last page of this paperwork. If you have not heard from [place] regarding the results in 2 weeks, please contact this office.         Signed, Merri Ray, MD [facility]

## 2020-10-15 ENCOUNTER — Ambulatory Visit (INDEPENDENT_AMBULATORY_CARE_PROVIDER_SITE_OTHER): Payer: 59 | Admitting: Family Medicine

## 2020-10-15 ENCOUNTER — Encounter: Payer: Self-pay | Admitting: Family Medicine

## 2020-10-15 DIAGNOSIS — I10 Essential (primary) hypertension: Secondary | ICD-10-CM

## 2020-10-15 DIAGNOSIS — R7303 Prediabetes: Secondary | ICD-10-CM

## 2020-10-15 DIAGNOSIS — M791 Myalgia, unspecified site: Secondary | ICD-10-CM

## 2020-10-16 LAB — COMPREHENSIVE METABOLIC PANEL
ALT: 16 IU/L (ref 0–32)
AST: 14 IU/L (ref 0–40)
Albumin/Globulin Ratio: 1.7 (ref 1.2–2.2)
Albumin: 4.3 g/dL (ref 3.8–4.9)
Alkaline Phosphatase: 82 IU/L (ref 44–121)
BUN/Creatinine Ratio: 22 (ref 12–28)
BUN: 15 mg/dL (ref 8–27)
Bilirubin Total: 0.3 mg/dL (ref 0.0–1.2)
CO2: 24 mmol/L (ref 20–29)
Calcium: 9.5 mg/dL (ref 8.7–10.3)
Chloride: 101 mmol/L (ref 96–106)
Creatinine, Ser: 0.69 mg/dL (ref 0.57–1.00)
Globulin, Total: 2.5 g/dL (ref 1.5–4.5)
Glucose: 113 mg/dL — ABNORMAL HIGH (ref 65–99)
Potassium: 4.5 mmol/L (ref 3.5–5.2)
Sodium: 140 mmol/L (ref 134–144)
Total Protein: 6.8 g/dL (ref 6.0–8.5)
eGFR: 99 mL/min/{1.73_m2} (ref >59)

## 2020-10-16 LAB — LIPID PANEL
Chol/HDL Ratio: 3.8 ratio (ref 0.0–4.4)
Cholesterol, Total: 180 mg/dL (ref 100–199)
HDL: 47 mg/dL (ref >39)
LDL Chol Calc (NIH): 108 mg/dL — ABNORMAL HIGH (ref 0–99)
Triglycerides: 143 mg/dL (ref 0–149)
VLDL Cholesterol Cal: 25 mg/dL (ref 5–40)

## 2020-10-16 LAB — CK: Total CK: 49 U/L (ref 32–182)

## 2020-10-16 LAB — HEMOGLOBIN A1C
Est. average glucose Bld gHb Est-mCnc: 128 mg/dL
Hgb A1c MFr Bld: 6.1 % — ABNORMAL HIGH (ref 4.8–5.6)

## 2020-11-04 ENCOUNTER — Other Ambulatory Visit: Payer: Self-pay | Admitting: Nephrology

## 2020-11-04 DIAGNOSIS — Z720 Tobacco use: Secondary | ICD-10-CM

## 2020-11-04 DIAGNOSIS — N200 Calculus of kidney: Secondary | ICD-10-CM

## 2020-11-04 DIAGNOSIS — I10 Essential (primary) hypertension: Secondary | ICD-10-CM

## 2020-11-23 ENCOUNTER — Other Ambulatory Visit: Payer: Self-pay

## 2020-11-23 ENCOUNTER — Ambulatory Visit
Admission: RE | Admit: 2020-11-23 | Discharge: 2020-11-23 | Disposition: A | Discharge status: Home / Self Care | Payer: 59 | Source: Ambulatory Visit | Attending: Nephrology | Admitting: Nephrology

## 2020-11-23 DIAGNOSIS — Z720 Tobacco use: Secondary | ICD-10-CM

## 2020-11-23 DIAGNOSIS — N200 Calculus of kidney: Secondary | ICD-10-CM

## 2020-11-23 DIAGNOSIS — I10 Essential (primary) hypertension: Secondary | ICD-10-CM

## 2020-11-24 ENCOUNTER — Ambulatory Visit (INDEPENDENT_AMBULATORY_CARE_PROVIDER_SITE_OTHER): Payer: 59 | Admitting: Cardiovascular Disease

## 2020-11-24 ENCOUNTER — Encounter: Payer: Self-pay | Admitting: Cardiovascular Disease

## 2020-11-24 VITALS — BP 130/72 | HR 78 | Ht 66.0 in | Wt 163.0 lb

## 2020-11-24 DIAGNOSIS — R079 Chest pain, unspecified: Secondary | ICD-10-CM | POA: Diagnosis not present

## 2020-11-24 DIAGNOSIS — F17200 Nicotine dependence, unspecified, uncomplicated: Secondary | ICD-10-CM

## 2020-11-24 DIAGNOSIS — R0602 Shortness of breath: Secondary | ICD-10-CM | POA: Diagnosis not present

## 2020-11-24 DIAGNOSIS — J449 Chronic obstructive pulmonary disease, unspecified: Secondary | ICD-10-CM | POA: Diagnosis not present

## 2020-11-24 DIAGNOSIS — R600 Localized edema: Secondary | ICD-10-CM

## 2020-11-24 NOTE — Patient Instructions (Addendum)
We will get CT results for Dr. Rockey Situ to review.    Medication Instructions:  No changes  If you need a refill on your cardiac medications before your next appointment, please call your pharmacy.    Lab work: No new labs needed  Testing/Procedures:  Echo for shortness of breath   We will call with results, may also see on [place] has requested that you have an echocardiogram. Echocardiography is a painless test that uses sound waves to create images of your heart. It provides your doctor with information about the size and shape of your heart and how well your heart's chambers and valves are working. This procedure takes approximately one hour. There are no restrictions for this procedure.  There is a possibility that an IV may need to be started during your test to inject an image enhancing agent. This is done to obtain more optimal pictures of your heart. Therefore we ask that you do at least drink some water prior to coming in to hydrate your veins.   Follow-Up:  . You will need a follow up appointment as needed  . Providers on your designated Care Team:   . Murray Hodgkins, NP . Christell Faith, PA-C . Marrianne Mood, PA-C   COVID-19 Vaccine Information can be found at: ShippingScam.co.uk For questions related to vaccine distribution or appointments, please email [email or web address] or call [phone].

## 2020-11-24 NOTE — Progress Notes (Signed)
Cardiology Office Note  Date:  11/24/2020   ID:  Vicki [name], DOB 1961/03/05, MRN [id number]  PCP:  Wendie Agreste, MD   Chief Complaint  Patient presents with  . New Patient (Initial Visit)    Referred by Dr. Carlota Raspberry for Chronic Dyspnea    HPI:  Ms. Vicki [name] is a 60 year old woman with past medical history of Smoking, COPD Hypertension Family history of coronary disease Referred by Dr. Cindee Lame for consultation of her shortness of breath  Previously evaluated in September 2014 for shortness of breath by Dr. Fletcher Anon At that time had some stress, husband died from end-stage liver disease, alcohol abuse  A stress test was ordered at the time Echocardiogram October 2014 normal ejection fraction, right heart pressures 30   CT scan chest August 2015 Mild emphysema noted at that time Images pulled up and reviewed, no coronary calcification, little if any aortic atherosclerosis  Reports that she is followed by pulmonary, has CT scans on annual basis, images and reports not in the [facility] system  Active at baseline Gym 3x a week, 5 months Weight loss through dietary changes Denies any anginal symptoms/chest pain on exertion If she does too much to the gym she gets muscle cramping  Reports some mild shortness of breath when climbing stairs Has 14 stairs to get into her house, able to go up and down several times  Father with CAD, MI  EKG personally reviewed by myself on todays visit Shows normal sinus rhythm with rate 78 bpm no significant ST-T wave changes  PMH:   has a past medical history of Acquired hallux valgus of left foot, Callous ulcer ([place]), Depression with anxiety, DEPRESSIVE DISORDER NOT ELSEWHERE CLASSIFIED, Diabetes mellitus without complication ([place]), Dyspnea, Foot deformity, acquired, Grief reaction, Hyperlipidemia, Hypertension, Myalgia and myositis, unspecified, Onychomycosis, Oral thrush, Rotator cuff syndrome of left shoulder, Rotator cuff tear,  right, Shortness of breath, SHOULDER PAIN, RIGHT, Sleep apnea, Sleep disorder, and Urinary hesitancy.  PSH:    Past Surgical History:  Procedure Laterality Date  . cryotherphy  1986  . TUBAL LIGATION      Current Outpatient Medications  Medication Sig Dispense Refill  . ASPIRIN 81 PO Take by mouth.    Marland Kitchen atorvastatin (LIPITOR) 10 MG tablet Take 1 tablet (10 mg total) by mouth daily. 90 tablet 0  . budesonide-formoterol (SYMBICORT) 160-4.5 MCG/ACT inhaler Inhale 2 puffs into the lungs 2 (two) times daily.    . cyclobenzaprine (FLEXERIL) 5 MG tablet 1 pill by mouth up to every 8 hours as needed. Start with one pill by mouth each bedtime as needed due to sedation 15 tablet 0  . escitalopram (LEXAPRO) 20 MG tablet Take 1 tablet (20 mg total) by mouth at bedtime. 90 tablet 1  . hydrOXYzine (ATARAX/VISTARIL) 25 MG tablet Take 0.5-1 tablets (12.5-25 mg total) by mouth every 8 (eight) hours as needed for anxiety. 30 tablet 3  . lisinopril (ZESTRIL) 20 MG tablet Take 1 tablet by mouth daily.    . meloxicam (MOBIC) 7.5 MG tablet Take 1 tablet (7.5 mg total) by mouth at bedtime as needed for pain. 30 tablet 3  . metFORMIN (GLUCOPHAGE) 500 MG tablet Take 1 tablet (500 mg total) by mouth 2 (two) times daily with a meal. 180 tablet 3  . VITAMIN E PO Take by mouth.     No current facility-administered medications for this visit.     Allergies:   Other and Codeine   Social History:  The patient  reports that she has been smoking cigarettes. She has been smoking about 0.50 packs per day. She has never used smokeless tobacco. She reports that she does not drink alcohol and does not use drugs.   Family History:   family history includes Breast cancer in her mother; Cancer in her mother; Diabetes in her mother; Heart disease in her father and mother; Stroke in her mother.    Review of Systems: Review of Systems  Constitutional: Negative.   Respiratory: Negative.   Cardiovascular: Negative.    Gastrointestinal: Negative.   Musculoskeletal: Negative.   Neurological: Negative.   Psychiatric/Behavioral: Negative.   All other systems reviewed and are negative.    PHYSICAL EXAM: VS:  BP 130/72   Pulse 78   Ht 5\' 6"  (1.676 m)   Wt 163 lb (73.9 kg)   BMI 26.31 kg/m  , BMI Body mass index is 26.31 kg/m. GEN: Well nourished, well developed, in no acute distress HEENT: normal Neck: no JVD, carotid bruits, or masses Cardiac: RRR; no murmurs, rubs, or gallops,no edema  Respiratory:  clear to auscultation bilaterally, normal work of breathing GI: soft, nontender, nondistended, + BS MS: no deformity or atrophy Skin: warm and dry, no rash Neuro:  Strength and sensation are intact Psych: euthymic mood, full affect   Recent Labs: 10/15/2020: ALT 16; BUN 15; Creatinine, Ser 0.69; Potassium 4.5; Sodium 140    Lipid Panel Lab Results  Component Value Date   CHOL 180 10/15/2020   HDL 47 10/15/2020   LDLCALC 108 (H) 10/15/2020   TRIG 143 10/15/2020      Wt Readings from Last 3 Encounters:  11/24/20 163 lb (73.9 kg)  10/14/20 162 lb (73.5 kg)  06/01/20 165 lb (74.8 kg)       ASSESSMENT AND PLAN:  Problem List Items Addressed This Visit    TOBACCO ABUSE   Edema, lower extremity   Obstructive chronic bronchitis without exacerbation ([place]) - Primary   Chest pain, central    Other Visit Diagnoses    Shortness of breath         Shortness of breath Likely secondary to underlying mild COPD She continues to smoke, cessation recommended Denies chest pain concerning for angina We have reviewed prior CT scan 2015, no aortic atherosclerosis or coronary calcification We have requested CT scan images and report performed recently for review -Echocardiogram ordered, last done 2014 at that time normal study -Currently working out at the gym 3 days a week, denies significant shortness of breath, does aerobic activity and some light weights We have not ordered stress testing,  recommend she call us for any anginal symptoms  Smoker We have encouraged her to continue to work on weaning her cigarettes and smoking cessation. She will continue to work on this and does not want any assistance with chantix.   Hyperlipidemia On Lipitor 10, managed by primary care  Essential hypertension Blood pressure stable on today's visit, no changes made    Total encounter time more than 60 minutes  Greater than 50% was spent in counseling and coordination of care with the patient  Ms. Probert was seen in consultation for Dr. Nyoka Cowden will be referred back to his office for ongoing care of the issues detailed above    Signed, Esmond Plants, M.D., Ph.D. [place], [place]

## 2020-11-25 ENCOUNTER — Ambulatory Visit (INDEPENDENT_AMBULATORY_CARE_PROVIDER_SITE_OTHER): Payer: 59 | Admitting: Neurology

## 2020-11-25 ENCOUNTER — Telehealth: Payer: Self-pay

## 2020-11-25 ENCOUNTER — Encounter: Payer: Self-pay | Admitting: Neurology

## 2020-11-25 VITALS — BP 138/79 | HR 85 | Ht 64.0 in | Wt 161.0 lb

## 2020-11-25 DIAGNOSIS — G4719 Other hypersomnia: Secondary | ICD-10-CM

## 2020-11-25 DIAGNOSIS — Z9989 Dependence on other enabling machines and devices: Secondary | ICD-10-CM | POA: Diagnosis not present

## 2020-11-25 DIAGNOSIS — G4733 Obstructive sleep apnea (adult) (pediatric): Secondary | ICD-10-CM

## 2020-11-25 NOTE — Patient Instructions (Addendum)
It was good to see you again today.  As discussed, we will look at your download to see if we need to make adjustments to the pressure or your mask fit.  If we cannot get connected wirelessly, please bring the machine in in the next few days so we can download directly from the machine.   Please continue to work on full compliance as you may benefit from treating your severe sleep apnea over time.  We will call you if we need to make adjustments to the pressure settings, once I have been able to review your compliance data report.  Please make an appointment to see one of our nurse practitioners routinely in 6 months in this clinic.  Please continue using your autoPAP regularly. While your insurance requires that you use PAP at least 4 hours each night on 70% of the nights, I recommend, that you not skip any nights and use it throughout the night if you can. Getting used to PAP and staying with the treatment long term does take time and patience and discipline. Untreated obstructive sleep apnea when it is moderate to severe can have an adverse impact on cardiovascular health and raise her risk for heart disease, arrhythmias, hypertension, congestive heart failure, stroke and diabetes. Untreated obstructive sleep apnea causes sleep disruption, nonrestorative sleep, and sleep deprivation. This can have an impact on your day to day functioning and cause daytime sleepiness and impairment of cognitive function, memory loss, mood disturbance, and problems focussing. Using PAP regularly can improve these symptoms.

## 2020-11-25 NOTE — Progress Notes (Signed)
Subjective:    Patient ID: Vicki [name] is a 60 y.o. female.  HPI     Interim history:   Vicki [name] is a 60 year old right-handed woman with an underlying medical history of COPD, vitamin D deficiency, hyperlipidemia, chronic back pain, anxiety, depression, and overweight state, who Presents for follow-up consultation of her obstructive sleep apnea after interim home sleep testing.  The patient is unaccompanied today. She missed an appointment on 08/13/2020. I first met her at the request of Dr. Pamella Pert on 01/02/2020, at which time she reported snoring and excessive daytime somnolence.  She was advised to proceed with a sleep study.  She had a home sleep test on 03/12/2020 which indicated severe obstructive sleep apnea with a total AHI of 54.2/hour and O2 nadir of 80%.  She was advised to start AutoPap therapy.  She missed an appointment for AutoPap set up with the DME company in October 2021.  Her set up date was 09/18/20.  Today, 11/25/20: She reports using the machine every night.  She has no trouble tolerating it but did not bring the machine and we are not wirelessly connected/tagged yet.  She would be willing to bring in the machine if need be.  She reports feeling just a little bit better first thing in the morning when it comes to her sleepiness.  Other than that she has not had any telltale results from treating her sleep apnea with her AutoPap machine.  She is motivated to continue with treatment.   The patient's allergies, current medications, family history, past medical history, past social history, past surgical history and problem list were reviewed and updated as appropriate.   Previously:   01/02/20: (She) reports snoring and excessive daytime somnolence.  I reviewed your office note from 12/13/2019.  Her Epworth sleepiness score is 13 out of 24, fatigue severity score is 55 out of 63.  He does not wake up rested, she is tired during the day, she has had occasional morning headaches.   She is not aware of any family history of OSA.  She smokes about 16 or 17 cigarettes/day and is working on smoking cessation, was previously able to reduce to 11 cigarettes/day.  She has reduced her caffeine intake, drinks about 2 cups of coffee in the mornings.  She lives with a friend, she has 3 grown children.  She has nocturia about twice per average night.  She has a bedtime of around 10.  She may sleep until 10:30 AM.  She does not currently work.  She has trouble going to sleep and sometimes takes an over-the-counter sleep aid.  She has discomfort in the right knee.  Her Past Medical History Is Significant For: Past Medical History:  Diagnosis Date  . Acquired hallux valgus of left foot   . Callous ulcer ([place])   . Depression with anxiety   . DEPRESSIVE DISORDER NOT ELSEWHERE CLASSIFIED   . Diabetes mellitus without complication ([place])    Phreesia 10/13/2020  . Dyspnea   . Foot deformity, acquired   . Grief reaction   . Hyperlipidemia    Phreesia 10/13/2020  . Hypertension    Phreesia 10/13/2020  . Myalgia and myositis, unspecified   . Onychomycosis   . Oral thrush   . Rotator cuff syndrome of left shoulder   . Rotator cuff tear, right   . Shortness of breath   . SHOULDER PAIN, RIGHT   . Sleep apnea    Phreesia 10/13/2020  . Sleep disorder   .  Urinary hesitancy     Her Past Surgical History Is Significant For: Past Surgical History:  Procedure Laterality Date  . cryotherphy  1986  . TUBAL LIGATION      Her Family History Is Significant For: Family History  Problem Relation Age of Onset  . Heart disease Mother   . Diabetes Mother   . Cancer Mother        breast and ovarian  . Stroke Mother   . Breast cancer Mother        diagnosed in her 65's  . Heart disease Father     Her Social History Is Significant For: Social History   Socioeconomic History  . Marital status: Widowed    Spouse name: Not on file  . Number of children: Not on file  . Years of  education: Not on file  . Highest education level: Not on file  Occupational History  . Not on file  Tobacco Use  . Smoking status: Current Every Day Smoker    Packs/day: 0.50    Types: Cigarettes  . Smokeless tobacco: Never Used  . Tobacco comment: increased smoking again due to stress  Substance and Sexual Activity  . Alcohol use: No  . Drug use: No  . Sexual activity: Never    Birth control/protection: Surgical, Post-menopausal  Other Topics Concern  . Not on file  Social History Narrative  . Not on file   Social Determinants of Health   Financial Resource Strain: Not on file  Food Insecurity: Not on file  Transportation Needs: Not on file  Physical Activity: Not on file  Stress: Not on file  Social Connections: Not on file    Her Allergies Are:  Allergies  Allergen Reactions  . Other   . Codeine Nausea And Vomiting  :   Her Current Medications Are:  Outpatient Encounter Medications as of 11/25/2020  Medication Sig  . ASPIRIN 81 PO Take by mouth.  Marland Kitchen atorvastatin (LIPITOR) 10 MG tablet Take 1 tablet (10 mg total) by mouth daily.  . budesonide-formoterol (SYMBICORT) 160-4.5 MCG/ACT inhaler Inhale 2 puffs into the lungs 2 (two) times daily.  . cyclobenzaprine (FLEXERIL) 5 MG tablet 1 pill by mouth up to every 8 hours as needed. Start with one pill by mouth each bedtime as needed due to sedation  . escitalopram (LEXAPRO) 20 MG tablet Take 1 tablet (20 mg total) by mouth at bedtime.  . hydrOXYzine (ATARAX/VISTARIL) 25 MG tablet Take 0.5-1 tablets (12.5-25 mg total) by mouth every 8 (eight) hours as needed for anxiety.  Marland Kitchen lisinopril (ZESTRIL) 20 MG tablet Take 1 tablet by mouth daily.  . meloxicam (MOBIC) 7.5 MG tablet Take 1 tablet (7.5 mg total) by mouth at bedtime as needed for pain.  . metFORMIN (GLUCOPHAGE) 500 MG tablet Take 1 tablet (500 mg total) by mouth 2 (two) times daily with a meal.  . VITAMIN E PO Take by mouth.   No facility-administered encounter  medications on file as of 11/25/2020.  :  Review of Systems:  Out of a complete 14 point review of systems, all are reviewed and negative with the exception of these symptoms as listed below: Review of Systems  Neurological:       Here for f/u on her autopap. Pt reports over all she is doing ok, but did not bring machine for DL.    Objective:  Neurological Exam  Physical Exam Physical Examination:   Vitals:   11/25/20 1418  BP: 138/79  Pulse: 85  General Examination: The patient is a very pleasant 60 y.o. female in no acute distress. She appears well-developed and well-nourished and well groomed.   HEENT: Normocephalic, atraumatic, pupils are equal, round and reactive to light, extraocular tracking is good without limitation to gaze excursion or nystagmus noted. Hearing is grossly intact. Face is symmetric with normal facial animation. Speech is clear with no dysarthria noted. There is no hypophonia. There is no lip, neck/head, jaw or voice tremor. Neck is supple with full range of passive and active motion. There are no carotid bruits on auscultation. Oropharynx exam reveals: moderate mouth dryness, adequate dental hygiene with dentures on the top, mild airway crowding secondary to small airway entry, tonsils of about 1+, tongue protrudes centrally and palate elevates symmetrically.    Chest: Clear to auscultation without wheezing, rhonchi or crackles noted.  Heart: S1+S2+0, regular and normal without murmurs, rubs or gallops noted.   Abdomen: Soft, non-tender and non-distended.  Extremities: There is no pitting edema in the distal lower extremities bilaterally.   Skin: Warm and dry without trophic changes noted.   Musculoskeletal: exam reveals some right knee swelling noted, mild valgus deformity of the right knee.   She also reports left foot pain.  Neurologically:  Mental status: The patient is awake, alert and oriented in all 4 spheres. Her immediate and remote  memory, attention, language skills and fund of knowledge are appropriate. There is no evidence of aphasia, agnosia, apraxia or anomia. Speech is clear with normal prosody and enunciation. Thought process is linear. Mood is normal and affect is normal.  Cranial nerves II - XII are as described above under HEENT exam.  Motor exam: Normal bulk, strength and tone is noted. There is no tremor, fine motor skills and coordination: grossly intact.  Cerebellar testing: No dysmetria or intention tremor. There is no truncal or gait ataxia.  Sensory exam: intact to light touch in the upper and lower extremities.  Gait, station and balance: She stands easily. No veering to one side is noted. No leaning to one side is noted. Posture is age-appropriate and stance is narrow based. Gait shows normal stride length and normal pace, mild limp on the right, stable.  Assessment and Plan:  In summary, Vicki [name] is a very pleasant 60 year old female  with an underlying medical history of COPD, vitamin D deficiency, hyperlipidemia, chronic back pain, anxiety, depression, and overweight state, who presents for follow-up consultation of her obstructive sleep apnea which was deemed in the severe range by home sleep testing on 03/12/2020.  She established treatment with AutoPap on 09/18/2020.  A compliance download is not available yet.  She is willing to bring the machine and if the need arises, we will try to get tagged wirelessly first.  She has not noticed a significant improvement in her daytime somnolence but does report waking up just a little bit better rested and less tired compared to before.  She is motivated to continue with treatment and she is advised to be fully compliant as she was found to have severe sleep apnea.  We can make adjustments to her pressure settings and her mask fit if the need arises based on her compliance download report.  For now, she is advised to continue with full compliance of her treatment.   She is also encouraged that benefit from sleep apnea treatment can be slower and it may take a longer time to get adjusted to treatment.  She is advised to follow-up routinely in this  clinic to see one of our nurse practitioners in 6 months, sooner if needed.  I answered all her questions today and she was in agreement.  I spent 25 minutes in total face-to-face time and in reviewing records during pre-charting, more than 50% of which was spent in counseling and coordination of care, reviewing test results, reviewing medications and treatment regimen and/or in discussing or reviewing the diagnosis of OSA, the prognosis and treatment options. Pertinent laboratory and imaging test results that were available during this visit with the patient were reviewed by me and considered in my medical decision making (see chart for details).

## 2020-11-25 NOTE — Telephone Encounter (Signed)
Request records/resutls faxed of pt's CT scan of her lungs, also request to mail a disk if able.

## 2020-12-07 ENCOUNTER — Encounter: Payer: Self-pay | Admitting: Cardiovascular Disease

## 2020-12-14 ENCOUNTER — Other Ambulatory Visit: Payer: Self-pay | Admitting: Urology

## 2020-12-14 MED ORDER — GEMCITABINE CHEMO FOR BLADDER INSTILLATION 2000 MG: 2000.0000 mg | Freq: Once | INTRAVENOUS | Status: AC

## 2020-12-16 ENCOUNTER — Encounter (HOSPITAL_COMMUNITY): Payer: Self-pay | Admitting: Urology

## 2020-12-16 ENCOUNTER — Other Ambulatory Visit: Payer: Self-pay

## 2020-12-16 NOTE — Progress Notes (Addendum)
COVID Vaccine Completed: x2 Date COVID Vaccine completed: Unsure of dates Has received booster: COVID vaccine manufacturer: Pfizer     Date of COVID positive in last 90 days:   N/A  PCP - Merri Ray, MD Cardiologist - Ida Rogue, MD  Chest x-ray - 04-21-20 Epic EKG - 11-24-20 Epic Stress Test - 2008 Epic ECHO - 2014 Epic Cardiac Cath - N/A Pacemaker/ICD device last checked: Spinal Cord Stimulator:  Sleep Study - 03-16-20 Epic, +sleep apnea CPAP - CPAP  Fasting Blood Sugar - 112 to 125 Checks Blood Sugar - occasional   Blood Thinner Instructions: Aspirin Instructions: ASA 81 mg.  Pt continues to take, was not advised to stop Last Dose:  Activity level:  Can go up a flight of stairs and perform activities of daily living without stopping and without symptoms of chest pain.  Patient does have shortness of breath at times with activity.  She is currently going to the gym three times a week and able to complete workouts.  Anesthesia review: COPD, hx of shortness of breath  Patient denies shortness of breath, fever, cough and chest pain at PAT appointment (completed over the phone)   Patient verbalized understanding of instructions that were given to them at the PAT appointment. Patient was also instructed that they will need to review over the PAT instructions again at home before surgery.

## 2020-12-17 ENCOUNTER — Other Ambulatory Visit (HOSPITAL_COMMUNITY)
Admission: RE | Admit: 2020-12-17 | Discharge: 2020-12-17 | Disposition: A | Discharge status: Home / Self Care | Payer: 59 | Source: Ambulatory Visit | Attending: Urology | Admitting: Urology

## 2020-12-17 DIAGNOSIS — Z20822 Contact with and (suspected) exposure to covid-19: Secondary | ICD-10-CM | POA: Insufficient documentation

## 2020-12-17 DIAGNOSIS — Z01812 Encounter for preprocedural laboratory examination: Secondary | ICD-10-CM | POA: Diagnosis present

## 2020-12-17 LAB — SARS CORONAVIRUS 2 (TAT 6-24 HRS): SARS Coronavirus 2: NEGATIVE

## 2020-12-18 ENCOUNTER — Ambulatory Visit (HOSPITAL_COMMUNITY): Payer: 59 | Attending: Cardiovascular Disease

## 2020-12-18 ENCOUNTER — Other Ambulatory Visit: Payer: Self-pay

## 2020-12-18 DIAGNOSIS — R0602 Shortness of breath: Secondary | ICD-10-CM

## 2020-12-18 LAB — ECHOCARDIOGRAM COMPLETE
AR max vel: 1.04 cm2
AV Area VTI: 1.2 cm2
AV Area mean vel: 1.09 cm2
AV Mean grad: 8 mmHg
AV Peak grad: 14.4 mmHg
Ao pk vel: 1.9 m/s
Area-P 1/2: 2.45 cm2
S' Lateral: 3.4 cm

## 2020-12-18 NOTE — H&P (Signed)
Office Visit Report     12/04/2020   --------------------------------------------------------------------------------   Vicki [name]. Greenhouse  MRN: [id number]  DOB: 10-07-1960, 60 year old Female  SSN: [id number]   PRIMARY CARE:  Merri Ray, MD  REFERRING:  Harrie Jeans, MD  PROVIDER:  Raynelle Bring, M.D.  LOCATION:  [facility] [phone]     --------------------------------------------------------------------------------   CC/HPI: 1. Right renal calculus  2. Possible bladder tumor   Vicki [name] is a 60 year old female who is seen today at the request of Dr. Harrie Jeans for a possible bladder tumor. She recently underwent a lumbar back x-ray for further evaluation of back pain. This demonstrated a possible 9 mm nonobstructing right renal calculus. She was referred to Nephrology and discussed stone prevention. As part of her evaluation, she did undergo a renal and bladder ultrasound. While no definite stone was seen on ultrasound and no hydronephrosis was noted, she was incidentally noted to have a pedunculated mass off the posterior bladder raising concern for possible bladder tumor. She does smoke approximately 1 pack per day and has smoked for 50 years. She denies any gross hematuria or flank pain. She has no prior history of urolithiasis or GU malignancy. She has never undergone GU surgery.     ALLERGIES: codeine    MEDICATIONS: Lisinopril 20 mg tablet  Lexapro 20 mg tablet  Meloxicam 7.5 mg tablet     GU PSH: None   NON-GU PSH: Bilateral Tubal Ligation     GU PMH: None   NON-GU PMH: Cardiac murmur, unspecified Diabetes Type 2 Hypercholesterolemia Hypertension    FAMILY HISTORY: 1 Daughter - Runs in Family 2 sons - Runs in Family Heart Disease - Father ovarian cancer - Mother   SOCIAL HISTORY: Marital Status: Widowed Preferred Language: English; Ethnicity: Not Hispanic Or Latino; Race: White Current Smoking Status: Patient smokes. Has smoked  since 11/07/1970. Smokes 1 pack per day.   Tobacco Use Assessment Completed: Used Tobacco in last 30 days? Has never drank.  Drinks 1 caffeinated drink per day.    REVIEW OF SYSTEMS:    GU Review Female:   Patient denies frequent urination, hard to postpone urination, burning /pain with urination, get up at night to urinate, leakage of urine, stream starts and stops, trouble starting your stream, have to strain to urinate, and currently pregnant.  Gastrointestinal (Lower):   Patient denies diarrhea and constipation.  Gastrointestinal (Upper):   Patient denies nausea and vomiting.  Constitutional:   Patient denies fever, night sweats, weight loss, and fatigue.  Skin:   Patient denies skin rash/ lesion and itching.  Eyes:   Patient denies blurred vision and double vision.  Ears/ Nose/ Throat:   Patient denies sore throat and sinus problems.  Hematologic/Lymphatic:   Patient denies swollen glands and easy bruising.  Cardiovascular:   Patient denies leg swelling and chest pains.  Respiratory:   Patient denies shortness of breath and cough.  Endocrine:   Patient denies excessive thirst.  Musculoskeletal:   Patient denies back pain and joint pain.  Neurological:   Patient denies headaches and dizziness.  Psychologic:   Patient denies depression and anxiety.   VITAL SIGNS:      12/04/2020 03:04 PM  Weight 160 lb / 72.57 kg  Height 64 in / 162.56 cm  BP 108/57 mmHg  Pulse 88 /min  Temperature 96.8 F / 36 C  BMI 27.5 kg/m   MULTI-SYSTEM PHYSICAL EXAMINATION:    Constitutional: Well-nourished. No physical deformities. Normally  developed. Good grooming.  Neck: Neck symmetrical, not swollen. Normal tracheal position.  Respiratory: No labored breathing, no use of accessory muscles. Clear bilaterally.  Cardiovascular: Normal temperature, normal extremity pulses, no swelling, no varicosities. Regular rate and rhythm.  Lymphatic: No enlargement of neck, axillae, groin.  Skin: No paleness, no  jaundice, no cyanosis. No lesion, no ulcer, no rash.  Neurologic / Psychiatric: Oriented to time, oriented to place, oriented to person. No depression, no anxiety, no agitation.  Gastrointestinal: No mass, no tenderness, no rigidity, non obese abdomen. No CVA tenderness.  Eyes: Normal conjunctivae. Normal eyelids.  Ears, Nose, Mouth, and Throat: Left ear no scars, no lesions, no masses. Right ear no scars, no lesions, no masses. Nose no scars, no lesions, no masses. Normal hearing. Normal lips.  Musculoskeletal: Normal gait and station of head and neck.     Complexity of Data:  X-Ray Review: Lower Spine: Reviewed Films.  Renal Ultrasound: Reviewed Films.    Notes:                     CLINICAL DATA: Nephrolithiasis.   EXAM:  RENAL / URINARY TRACT ULTRASOUND COMPLETE   COMPARISON: Lumbar spine 10/14/2020   FINDINGS:  Right Kidney:   Renal measurements: 11 x 5.3 x 4.9 cm = volume: 151.7 mL.  Echogenicity within normal limits. No mass or hydronephrosis  visualized.   Left Kidney:   Renal measurements: 10.4 x 5.8 x 5.7 cm = volume: 180.6 mL.  Echogenicity within normal limits. No mass or hydronephrosis  visualized.   Bladder:   There is a solid mural nodule along the posterior bladder base near  the left UVJ which measures 1.7 x 1.3 x 2.2 cm and has internal  blood flow.   Other:   None.   IMPRESSION:  1. Suspicious, solid mural nodule along the posterior bladder base  near the left UVJ. Cannot rule out urothelial neoplasm. Advise  further evaluation with hematuria protocol CT of the abdomen and  pelvis without and with contrast material.  2. No kidney stones identified and no hydronephrosis.  3. These results will be called to the ordering clinician or  representative by the Radiologist Assistant, and communication  documented in the PACS or [employer].    Electronically Signed  By: Kerby Moors M.D.  On: 11/24/2020 10:42   CLINICAL DATA: pop in R upper  thigh with exercise 3 weeks ago -  pain radiating down. chronic back pain   Lumbosacral back pain with right-sided sciatica.   EXAM:  LUMBAR SPINE - 2-3 VIEW   COMPARISON: None.   FINDINGS:  Minimal broad-based scoliotic curvature versus positioning. No  listhesis. Vertebral body heights are normal. Mild endplate spurring  at multiple levels. Mild disc space narrowing at L2-L3. facet  hypertrophy at L5-S1. No fracture or bony destruction. There is a 9  mm calcification in the right abdomen to the right of L2 that may  represent a renal stone. Aorto bi-iliac atherosclerosis.   IMPRESSION:  1. No acute abnormality in the lumbar spine.  2. Mild degenerative disc disease and lower lumbar facet  hypertrophy.  3. Possible 9 mm right renal stone.    Electronically Signed  By: Keith Rake M.D.  On: 10/14/2020 16:52   PROCEDURES:         Flexible Cystoscopy - 52000  Indication: Bladder tumor  Risks, benefits, and some of the potential complications of the procedure were discussed at length with the patient including infection, bleeding, voiding  discomfort, urinary retention, fever, chills, sepsis, and others. All questions were answered. Informed consent was obtained. Antibiotic prophylaxis was given. Sterile technique and intraurethral analgesia were used.  Meatus:  Normal size. Normal location. Normal condition.  Urethra:  No hypermobility. No leakage.  Ureteral Orifices:  Normal location. Normal size. Normal shape. Effluxed clear urine.  Bladder:  Systematic examination of bladder reveals a 2 cm papillary bladder tumor toward the left hemi trigone near the left ureteral orifice. No other tumors are noted. No other mucosal abnormalities are noted. A bladder washing was obtained for cytology.      Chaperone: SM The lower urinary tract was carefully examined. The procedure was well-tolerated and without complications. Antibiotic instructions were given. Instructions were given to  call the office immediately for bloody urine, difficulty urinating, urinary retention, painful or frequent urination, fever, chills, nausea, vomiting or other illness. The patient stated that she understood these instructions and would comply with them.         Urinalysis - 81003 Dipstick Dipstick Cont'd  Color: Yellow Bilirubin: Neg mg/dL  Appearance: Clear Ketones: Neg mg/dL  Specific Gravity: 1.010 Blood: Neg ery/uL  pH: 5.5 Protein: Neg mg/dL  Glucose: Neg mg/dL Urobilinogen: 0.2 mg/dL    Nitrites: Neg    Leukocyte Esterase: Neg leu/uL    ASSESSMENT:      ICD-10 Details  1 GU:   Renal calculus - N20.0   2   Bladder tumor/neoplasm - D41.4    PLAN:           Orders Labs Urine Cytology          Schedule Return Visit/Planned Activity: Other See Visit Notes             Note: Will call to schedule surgery.          Document Letter(s):  Created for Merri Ray, MD   Created for Patient: Clinical Summary         Notes:   1. Bladder tumor: We discussed her cystoscopic findings today that do indicate a papillary bladder tumor. I have recommended that she proceed with cystoscopy, trans urethral resection of her bladder tumor, and bilateral retrograde pyelography as well as postoperative intravesical chemotherapy instillation. She may require left ureteral stent placement as well. The potential risks and benefits as well as the expected recovery process was discussed in detail. She gives informed consent to proceed.   2. Possible right renal calculus: This still remains a possibility although her ultrasound was not convincing. Currently, she is asymptomatic. She has been previously informed of recommendations for stone prevention by Dr. Royce Macadamia. Furthermore, we discussed the potential signs and symptoms of an obstructing kidney stone today and she will notify me should this occur.   3. Tobacco use: I did advise her that her main risk factor for developing her bladder cancer was  tobacco use. As such, I have recommended that she seriously work on smoking cessation. I will send a letter to her primary care physician to see if he can be of assistance to her with this. She does seem motivated at this point.   Cc: Dr. Merri Ray  Dr. Harrie Jeans    * Signed by Raynelle Bring, M.D. on 12/04/20 at 6:31 PM (EDT)*

## 2020-12-21 ENCOUNTER — Ambulatory Visit (HOSPITAL_COMMUNITY): Payer: 59 | Admitting: Physician Assistant

## 2020-12-21 ENCOUNTER — Ambulatory Visit (HOSPITAL_COMMUNITY): Payer: 59

## 2020-12-21 ENCOUNTER — Ambulatory Visit (HOSPITAL_COMMUNITY)
Admission: RE | Admit: 2020-12-21 | Discharge: 2020-12-21 | Disposition: A | Discharge status: Home / Self Care | Payer: 59 | Attending: Urology | Admitting: Urology

## 2020-12-21 ENCOUNTER — Encounter (HOSPITAL_COMMUNITY)
Admission: RE | Disposition: A | Discharge status: Home / Self Care | Payer: Self-pay | Source: Home / Self Care | Attending: Urology

## 2020-12-21 ENCOUNTER — Encounter (HOSPITAL_COMMUNITY): Payer: Self-pay | Admitting: Urology

## 2020-12-21 ENCOUNTER — Other Ambulatory Visit: Payer: Self-pay

## 2020-12-21 DIAGNOSIS — Z79899 Other long term (current) drug therapy: Secondary | ICD-10-CM | POA: Insufficient documentation

## 2020-12-21 DIAGNOSIS — Z885 Allergy status to narcotic agent status: Secondary | ICD-10-CM | POA: Insufficient documentation

## 2020-12-21 DIAGNOSIS — Z791 Long term (current) use of non-steroidal anti-inflammatories (NSAID): Secondary | ICD-10-CM | POA: Diagnosis not present

## 2020-12-21 DIAGNOSIS — D494 Neoplasm of unspecified behavior of bladder: Secondary | ICD-10-CM | POA: Diagnosis present

## 2020-12-21 DIAGNOSIS — F17210 Nicotine dependence, cigarettes, uncomplicated: Secondary | ICD-10-CM | POA: Insufficient documentation

## 2020-12-21 DIAGNOSIS — C678 Malignant neoplasm of overlapping sites of bladder: Secondary | ICD-10-CM | POA: Insufficient documentation

## 2020-12-21 HISTORY — DX: Neoplasm of unspecified behavior of bladder: D49.4

## 2020-12-21 HISTORY — DX: Personal history of urinary calculi: Z87.442

## 2020-12-21 HISTORY — DX: Prediabetes: R73.03

## 2020-12-21 HISTORY — DX: Pneumonia, unspecified organism: J18.9

## 2020-12-21 HISTORY — PX: TRANSURETHRAL RESECTION OF BLADDER TUMOR: SHX2575

## 2020-12-21 LAB — BASIC METABOLIC PANEL
Anion gap: 7 (ref 5–15)
BUN: 20 mg/dL (ref 8–23)
CO2: 29 mmol/L (ref 22–32)
Calcium: 9.2 mg/dL (ref 8.9–10.3)
Chloride: 101 mmol/L (ref 98–111)
Creatinine, Ser: 0.53 mg/dL (ref 0.44–1.00)
GFR, Estimated: >60 mL/min (ref >60)
Glucose, Bld: 116 mg/dL — ABNORMAL HIGH (ref 70–99)
Potassium: 4 mmol/L (ref 3.5–5.1)
Sodium: 137 mmol/L (ref 135–145)

## 2020-12-21 LAB — CBC
HCT: 42.9 % (ref 36.0–46.0)
Hemoglobin: 14 g/dL (ref 12.0–15.0)
MCH: 29.2 pg (ref 26.0–34.0)
MCHC: 32.6 g/dL (ref 30.0–36.0)
MCV: 89.6 fL (ref 80.0–100.0)
Platelets: 357 10*3/uL (ref 150–400)
RBC: 4.79 MIL/uL (ref 3.87–5.11)
RDW: 13 % (ref 11.5–15.5)
WBC: 8.3 10*3/uL (ref 4.0–10.5)
nRBC: 0 % (ref 0.0–0.2)

## 2020-12-21 LAB — HEMOGLOBIN A1C
Hgb A1c MFr Bld: 6.3 % — ABNORMAL HIGH (ref 4.8–5.6)
Mean Plasma Glucose: 134.11 mg/dL

## 2020-12-21 LAB — GLUCOSE, CAPILLARY: Glucose-Capillary: 132 mg/dL — ABNORMAL HIGH (ref 70–99)

## 2020-12-21 SURGERY — TURBT (TRANSURETHRAL RESECTION OF BLADDER TUMOR)
Anesthesia: General

## 2020-12-21 MED ORDER — DEXAMETHASONE SODIUM PHOSPHATE 10 MG/ML IJ SOLN
INTRAMUSCULAR | Status: AC
  Filled 2020-12-21: qty 1

## 2020-12-21 MED ORDER — ROCURONIUM BROMIDE 10 MG/ML (PF) SYRINGE
PREFILLED_SYRINGE | INTRAVENOUS | Status: DC | PRN
  Administered 2020-12-21: 60 mg via INTRAVENOUS

## 2020-12-21 MED ORDER — MIDAZOLAM HCL 2 MG/2ML IJ SOLN
INTRAMUSCULAR | Status: AC
  Filled 2020-12-21: qty 2

## 2020-12-21 MED ORDER — FENTANYL CITRATE (PF) 100 MCG/2ML IJ SOLN
INTRAMUSCULAR | Status: DC | PRN
  Administered 2020-12-21: 100 ug via INTRAVENOUS

## 2020-12-21 MED ORDER — MIDAZOLAM HCL 5 MG/5ML IJ SOLN
INTRAMUSCULAR | Status: DC | PRN
  Administered 2020-12-21: 2 mg via INTRAVENOUS

## 2020-12-21 MED ORDER — OXYCODONE HCL 5 MG PO TABS: 5.0000 mg | ORAL_TABLET | Freq: Once | ORAL | Status: DC | PRN

## 2020-12-21 MED ORDER — ACETAMINOPHEN 160 MG/5ML PO SOLN
325.0000 mg | ORAL | Status: DC | PRN
Start: 2020-12-21 — End: 2020-12-21

## 2020-12-21 MED ORDER — ACETAMINOPHEN 325 MG PO TABS
325.0000 mg | ORAL_TABLET | ORAL | Status: DC | PRN
Start: 2020-12-21 — End: 2020-12-21

## 2020-12-21 MED ORDER — FENTANYL CITRATE (PF) 100 MCG/2ML IJ SOLN
INTRAMUSCULAR | Status: AC
  Filled 2020-12-21: qty 2

## 2020-12-21 MED ORDER — CEFAZOLIN SODIUM-DEXTROSE 2-4 GM/100ML-% IV SOLN
2.0000 g | Freq: Once | INTRAVENOUS | Status: AC
  Administered 2020-12-21: 2 g via INTRAVENOUS
  Filled 2020-12-21: qty 100

## 2020-12-21 MED ORDER — MEPERIDINE HCL 50 MG/ML IJ SOLN: 6.2500 mg | INTRAMUSCULAR | Status: DC | PRN

## 2020-12-21 MED ORDER — ACETAMINOPHEN 10 MG/ML IV SOLN: 1000.0000 mg | Freq: Once | INTRAVENOUS | Status: DC | PRN

## 2020-12-21 MED ORDER — CHLORHEXIDINE GLUCONATE 0.12 % MT SOLN: 15.0000 mL | Freq: Once | OROMUCOSAL | Status: AC

## 2020-12-21 MED ORDER — OXYCODONE HCL 5 MG/5ML PO SOLN: 5.0000 mg | Freq: Once | ORAL | Status: DC | PRN

## 2020-12-21 MED ORDER — LIDOCAINE 2% (20 MG/ML) 5 ML SYRINGE
INTRAMUSCULAR | Status: DC | PRN
  Administered 2020-12-21: 100 mg via INTRAVENOUS

## 2020-12-21 MED ORDER — FENTANYL CITRATE (PF) 100 MCG/2ML IJ SOLN: 25.0000 ug | INTRAMUSCULAR | Status: DC | PRN

## 2020-12-21 MED ORDER — SUGAMMADEX SODIUM 200 MG/2ML IV SOLN
INTRAVENOUS | Status: DC | PRN
  Administered 2020-12-21: 200 mg via INTRAVENOUS

## 2020-12-21 MED ORDER — PHENYLEPHRINE 40 MCG/ML (10ML) SYRINGE FOR IV PUSH (FOR BLOOD PRESSURE SUPPORT)
PREFILLED_SYRINGE | INTRAVENOUS | Status: DC | PRN
  Administered 2020-12-21 (×2): 80 ug via INTRAVENOUS

## 2020-12-21 MED ORDER — ALBUTEROL SULFATE HFA 108 (90 BASE) MCG/ACT IN AERS
INHALATION_SPRAY | RESPIRATORY_TRACT | Status: AC
  Filled 2020-12-21: qty 6.7

## 2020-12-21 MED ORDER — DEXAMETHASONE SODIUM PHOSPHATE 10 MG/ML IJ SOLN
INTRAMUSCULAR | Status: DC | PRN
  Administered 2020-12-21: 10 mg via INTRAVENOUS

## 2020-12-21 MED ORDER — ONDANSETRON HCL 4 MG/2ML IJ SOLN: 4.0000 mg | Freq: Once | INTRAMUSCULAR | Status: DC | PRN

## 2020-12-21 MED ORDER — LACTATED RINGERS IV SOLN: INTRAVENOUS | Status: DC

## 2020-12-21 MED ORDER — PROPOFOL 10 MG/ML IV BOLUS
INTRAVENOUS | Status: AC
  Filled 2020-12-21: qty 20

## 2020-12-21 MED ORDER — 0.9 % SODIUM CHLORIDE (POUR BTL) OPTIME
TOPICAL | Status: DC | PRN
  Administered 2020-12-21: 1000 mL

## 2020-12-21 MED ORDER — ONDANSETRON HCL 4 MG/2ML IJ SOLN
INTRAMUSCULAR | Status: DC | PRN
  Administered 2020-12-21: 4 mg via INTRAVENOUS

## 2020-12-21 MED ORDER — PHENAZOPYRIDINE HCL 200 MG PO TABS: 200.0000 mg | ORAL_TABLET | Freq: Three times a day (TID) | ORAL | 0 refills | Status: DC | PRN

## 2020-12-21 MED ORDER — PROPOFOL 10 MG/ML IV BOLUS
INTRAVENOUS | Status: DC | PRN
  Administered 2020-12-21: 200 mg via INTRAVENOUS

## 2020-12-21 MED ORDER — SODIUM CHLORIDE 0.9 % IR SOLN
Status: DC | PRN
  Administered 2020-12-21: 6000 mL

## 2020-12-21 MED ORDER — IOHEXOL 300 MG/ML  SOLN
INTRAMUSCULAR | Status: DC | PRN
  Administered 2020-12-21: 20 mL

## 2020-12-21 MED ORDER — ORAL CARE MOUTH RINSE
15.0000 mL | Freq: Once | OROMUCOSAL | Status: AC
  Administered 2020-12-21: 15 mL via OROMUCOSAL

## 2020-12-21 MED ORDER — ONDANSETRON HCL 4 MG/2ML IJ SOLN
INTRAMUSCULAR | Status: AC
  Filled 2020-12-21: qty 2

## 2020-12-21 MED ORDER — INDIGOTINDISULFONATE SODIUM 8 MG/ML IJ SOLN
INTRAMUSCULAR | Status: AC
  Filled 2020-12-21: qty 5

## 2020-12-21 MED ORDER — ALBUTEROL SULFATE HFA 108 (90 BASE) MCG/ACT IN AERS
INHALATION_SPRAY | RESPIRATORY_TRACT | Status: DC | PRN
  Administered 2020-12-21 (×2): 2 via RESPIRATORY_TRACT

## 2020-12-21 MED ORDER — INDIGOTINDISULFONATE SODIUM 8 MG/ML IJ SOLN
INTRAMUSCULAR | Status: DC | PRN
  Administered 2020-12-21: 5 mL via INTRAVENOUS

## 2020-12-21 SURGICAL SUPPLY — 18 items
BAG DRN RND TRDRP ANRFLXCHMBR (UROLOGICAL SUPPLIES)
BAG URINE DRAIN 2000ML AR STRL (UROLOGICAL SUPPLIES) IMPLANT
BAG URO CATCHER STRL LF (MISCELLANEOUS) ×2 IMPLANT
CATH INTERMIT  6FR 70CM (CATHETERS) ×1 IMPLANT
DRAPE FOOT SWITCH (DRAPES) ×2 IMPLANT
ELECT REM PT RETURN 15FT ADLT (MISCELLANEOUS) ×1 IMPLANT
GLOVE SURG ENC TEXT LTX SZ7.5 (GLOVE) ×2 IMPLANT
GOWN STRL REUS W/TWL LRG LVL3 (GOWN DISPOSABLE) ×2 IMPLANT
GUIDEWIRE STR DUAL SENSOR (WIRE) ×1 IMPLANT
KIT TURNOVER KIT A (KITS) ×2 IMPLANT
LOOP CUT BIPOLAR 24F LRG (ELECTROSURGICAL) ×1 IMPLANT
MANIFOLD NEPTUNE II (INSTRUMENTS) ×2 IMPLANT
PACK CYSTO (CUSTOM PROCEDURE TRAY) ×2 IMPLANT
STENT URET 6FRX24 CONTOUR (STENTS) ×1 IMPLANT
SYR TOOMEY IRRIG 70ML (MISCELLANEOUS) ×2
SYRINGE TOOMEY IRRIG 70ML (MISCELLANEOUS) IMPLANT
TUBING CONNECTING 10 (TUBING) ×2 IMPLANT
TUBING UROLOGY SET (TUBING) ×1 IMPLANT

## 2020-12-21 NOTE — Interval H&P Note (Signed)
History and Physical Interval Note:  12/21/2020 8:32 AM  Vicki [name] Vicki [name]  has presented today for surgery, with the diagnosis of BLADDER TUMOR.  The various methods of treatment have been discussed with the patient and family. After consideration of risks, benefits and other options for treatment, the patient has consented to  Procedure(s) with comments: TRANSURETHRAL RESECTION OF BLADDER TUMOR (TURBT)WITH CYSTOSTOPY/ BILATERAL RETROGRADE PYELOGRAM/ POST OPERATIVE  INTRAVESICAL ISNTILLATION OG GEMCITABINE (N/A) - GENERAL ANESTHESIA WITHH PARALYSIS as a surgical intervention.  The patient's history has been reviewed, patient examined, no change in status, stable for surgery.  I have reviewed the patient's chart and labs.  Questions were answered to the patient's satisfaction.     [employer]

## 2020-12-21 NOTE — Anesthesia Procedure Notes (Signed)
Procedure Name: Intubation Performed by: Tully Mcinturff L, CRNA Pre-anesthesia Checklist: Patient identified, Emergency Drugs available, Suction available, Patient being monitored and Timeout performed Patient Re-evaluated:Patient Re-evaluated prior to induction Oxygen Delivery Method: Circle system utilized Preoxygenation: Pre-oxygenation with 100% oxygen Induction Type: IV induction Ventilation: Mask ventilation without difficulty Laryngoscope Size: Mac and 3 Grade View: Grade I Tube type: Oral Tube size: 7.0 mm Number of attempts: 1 Airway Equipment and Method: Stylet Placement Confirmation: ETT inserted through vocal cords under direct vision,  positive ETCO2,  CO2 detector and breath sounds checked- equal and bilateral Secured at: 22 cm Tube secured with: Tape Dental Injury: Teeth and Oropharynx as per pre-operative assessment        

## 2020-12-21 NOTE — Transfer of Care (Signed)
Immediate Anesthesia Transfer of Care Note  Patient: Vicki [name]  Procedure(s) Performed: TRANSURETHRAL RESECTION OF BLADDER TUMOR (TURBT)WITH CYSTOSTOPY/ BILATERAL RETROGRADE PYELOGRAM WITH LEFT STENT PLACEMENT (N/A )  Patient Location: PACU  Anesthesia Type:General  Level of Consciousness: awake, drowsy and patient cooperative  Airway & Oxygen Therapy: Patient Spontanous Breathing and Patient connected to face mask oxygen  Post-op Assessment: Report given to RN and Post -op Vital signs reviewed and stable  Post vital signs: Reviewed and stable  Last Vitals:  Vitals Value Taken Time  BP 153/85 12/21/20 0938  Temp 36.4 C 12/21/20 0938  Pulse 92 12/21/20 0940  Resp 18 12/21/20 0940  SpO2 100 % 12/21/20 0940  Vitals shown include unvalidated device data.  Last Pain:  Vitals:   12/21/20 0740  TempSrc: Oral  PainSc:       Patients Stated Pain Goal: 1 (83/09/40 7680)  Complications: No complications documented.

## 2020-12-21 NOTE — Discharge Instructions (Signed)

## 2020-12-21 NOTE — Anesthesia Preprocedure Evaluation (Addendum)
Anesthesia Evaluation  Patient identified by MRN, date of birth, ID band Patient awake    Reviewed: Allergy & Precautions, NPO status , Patient's Chart, lab work & pertinent test results  Airway Mallampati: I       Dental  (+) Edentulous Upper, Edentulous Lower   Pulmonary sleep apnea , Current Smoker and Patient abstained from smoking.,    Pulmonary exam normal        Cardiovascular hypertension, Pt. on medications Normal cardiovascular exam     Neuro/Psych PSYCHIATRIC DISORDERS Anxiety Depression    GI/Hepatic negative GI ROS, Neg liver ROS,   Endo/Other  diabetes, Type 2, Oral Hypoglycemic Agents  Renal/GU      Musculoskeletal   Abdominal Normal abdominal exam  (+)   Peds  Hematology   Anesthesia Other Findings   Reproductive/Obstetrics                            Anesthesia Physical Anesthesia Plan  ASA: II  Anesthesia Plan: General   Post-op Pain Management:    Induction: Intravenous  PONV Risk Score and Plan: 4 or greater and Ondansetron, Midazolam and Treatment may vary due to age or medical condition  Airway Management Planned: LMA and Oral ETT  Additional Equipment: None  Intra-op Plan:   Post-operative Plan: Extubation in OR  Informed Consent: I have reviewed the patients History and Physical, chart, labs and discussed the procedure including the risks, benefits and alternatives for the proposed anesthesia with the patient or authorized representative who has indicated his/her understanding and acceptance.     Dental advisory given  Plan Discussed with: CRNA  Anesthesia Plan Comments:        Anesthesia Quick Evaluation

## 2020-12-21 NOTE — Op Note (Signed)
Preoperative diagnosis: Bladder tumor (2.5 cm)  Postoperative diagnosis: Bladder tumor (2.5 cm)  Procedures: 1.  Cystoscopy 2.  Bilateral retrograde pyelography with interpretation 3.  Transurethral resection of bladder tumor (2.5 cm) 4.  Left ureteral stent placement  Surgeon: Pryor Curia MD  Anesthesia: General  Complications: None  EBL: Minimal  Intraoperative findings: A 2.5 cm papillary bladder tumor was located in the left hemitrigone overlying the left ureteral orifice which could not be initially identified.  Bilateral retrograde pyelography was performed with a 6 French ureteral catheter and Omnipaque contrast.  This demonstrated no evidence of ureteral or renal collecting system filling defects.  Specimens: 1.  Bladder tumor of left trigone 2.  Base of bladder tumor  Disposition of specimens: Pathology  Indication: Vicki [name] is a 60 year old female who recently presented with an incidentally detected bladder mass.  It was confirmed on cystoscopy to be a papillary bladder tumor.  She presents today for the above procedures.  The potential risks, complications, and expected recovery process was discussed in detail.  Informed consent was obtained.  Description of procedure: The patient was taken the operating room and a general anesthetic was administered.  She was given preoperative antibiotics, placed in the dorsolithotomy position, and prepped and draped in the usual sterile fashion.  Next, a preoperative timeout was performed.  Cystourethroscopy was then performed.  The bladder was examined systematically.  There was noted to be a 2.5 cm papillary bladder tumor overlying the left hemitrigone.  The right ureteral orifice was readily identified in its normal expected anatomic location and was effluxing urine.  The left ureteral orifice was obscured by the tumor.  No other tumors or other mucosal abnormalities were noted.  A 6 French ureteral catheter was then used to  perform a retrograde pyelogram on the right side.  Findings are as dictated above.  There were no concerning abnormalities.  The cystoscope was then removed and replaced with a 26 French resectoscope.  Using bipolar cutting loop, the bladder tumor was resected.  The ureteral orifice was not immediately identified on the left side even after tumor resection.  The specimen was sent as the main tumor specimen called bladder tumor of left trigone.  Indigocarmine was administered intravenously.  I then proceeded with further resection of the remaining tumor at the base of the bladder tumor until all tumor was grossly resected.  At this point, I was able to identify indigocarmine that was effluxing from the left ureteral orifice.  The resectoscope was then removed and replaced with the cystoscope again.  A 0.38 sensor guidewire was able to be manipulated into the left ureteral orifice and up into the renal collecting system.  A 6 French ureteral catheter was placed over the wire and a retrograde pyelogram was performed which noted no abnormalities as described above.  A 6 x 24 double-J ureteral stent was then advanced over the wire using Seldinger technique and positioned appropriately under fluoroscopic cystoscopic guidance.  The wire was removed with a good curl noted in the renal pelvis as well as within the bladder.  The resectoscope was then again placed into the bladder and hemostasis was achieved with bipolar loop cautery.  The bladder was emptied and reinspected.  She tolerated procedure well and without complications.  She was able to be awakened and transferred to recovery unit in satisfactory condition.

## 2020-12-21 NOTE — Anesthesia Postprocedure Evaluation (Signed)
Anesthesia Post Note  Patient: Vicki [name]  Procedure(s) Performed: TRANSURETHRAL RESECTION OF BLADDER TUMOR (TURBT)WITH CYSTOSTOPY/ BILATERAL RETROGRADE PYELOGRAM WITH LEFT STENT PLACEMENT (N/A )     Patient location during evaluation: PACU Anesthesia Type: General Level of consciousness: awake Pain management: pain level controlled Vital Signs Assessment: post-procedure vital signs reviewed and stable Respiratory status: spontaneous breathing Cardiovascular status: stable Postop Assessment: no apparent nausea or vomiting Anesthetic complications: no   No complications documented.  Last Vitals:  Vitals:   12/21/20 1049 12/21/20 1053  BP:    Pulse: 85 80  Resp: 19 12  Temp:  36.6 C  SpO2: 97% 92%    Last Pain:  Vitals:   12/21/20 1053  TempSrc:   PainSc: 0-No pain                 Huston Foley

## 2020-12-22 ENCOUNTER — Encounter (HOSPITAL_COMMUNITY): Payer: Self-pay | Admitting: Urology

## 2020-12-22 LAB — SURGICAL PATHOLOGY

## 2020-12-23 ENCOUNTER — Telehealth: Payer: Self-pay

## 2020-12-23 NOTE — Telephone Encounter (Signed)
Left detail message on VM of pt's recent results okay by DPR, Dr. Rockey Situ advised   " Echocardiogram  Normal cardiac function, no significant valve disease, normal pressures  Would continue her workouts at the gym"  At this time, no further recommendations or medications changes, advised to call office for any concerns or questions, otherwise will see at next visit. Results are also released to MyChart for review.

## 2020-12-25 ENCOUNTER — Other Ambulatory Visit: Payer: Self-pay

## 2020-12-25 ENCOUNTER — Telehealth: Payer: Self-pay | Admitting: Family Medicine

## 2020-12-25 MED ORDER — ATORVASTATIN CALCIUM 10 MG PO TABS: 10.0000 mg | ORAL_TABLET | Freq: Every day | ORAL | 0 refills | Status: DC

## 2020-12-25 NOTE — Telephone Encounter (Signed)
LFD 03/13/20#30 with 3 refills LOV 10/15/20 NOV 01/21/21

## 2020-12-25 NOTE — Telephone Encounter (Signed)
..  Medication Refills  Last OV:  Medication:  Lipitor & hydroxizine Pharmacy:  [facility] Let patient know to contact pharmacy at the end of the day to make sure medication is ready.   Please notify patient to allow 48-72 hours to process.  Encourage patient to contact the pharmacy for refills or they can request refills through [place] out below:   Last refill:  QTY:  Refill Date:    Other Comments:   Okay for refill?  Please advise.

## 2020-12-25 NOTE — Telephone Encounter (Signed)
Lipitor sent to pharmacy and hydroxyzine sent to pcp for approval.

## 2020-12-28 MED ORDER — HYDROXYZINE HCL 25 MG PO TABS: 12.5000 mg | ORAL_TABLET | Freq: Three times a day (TID) | ORAL | 3 refills | Status: DC | PRN

## 2021-01-15 ENCOUNTER — Other Ambulatory Visit: Payer: Self-pay | Admitting: Family Medicine

## 2021-01-15 ENCOUNTER — Encounter: Payer: Self-pay | Admitting: Family Medicine

## 2021-01-15 DIAGNOSIS — C670 Malignant neoplasm of trigone of bladder: Secondary | ICD-10-CM | POA: Insufficient documentation

## 2021-01-15 DIAGNOSIS — F418 Other specified anxiety disorders: Secondary | ICD-10-CM

## 2021-01-21 ENCOUNTER — Other Ambulatory Visit: Payer: Self-pay

## 2021-01-21 ENCOUNTER — Emergency Department (HOSPITAL_COMMUNITY): Payer: 59

## 2021-01-21 ENCOUNTER — Emergency Department (HOSPITAL_COMMUNITY)
Admission: EM | Admit: 2021-01-21 | Discharge: 2021-01-21 | Disposition: A | Discharge status: Home / Self Care | Payer: 59 | Attending: Emergency Medicine | Admitting: Emergency Medicine

## 2021-01-21 ENCOUNTER — Encounter (HOSPITAL_COMMUNITY): Payer: Self-pay | Admitting: Emergency Medicine

## 2021-01-21 ENCOUNTER — Ambulatory Visit: Payer: 59 | Admitting: Family Medicine

## 2021-01-21 DIAGNOSIS — Z79899 Other long term (current) drug therapy: Secondary | ICD-10-CM | POA: Diagnosis not present

## 2021-01-21 DIAGNOSIS — Z7982 Long term (current) use of aspirin: Secondary | ICD-10-CM | POA: Insufficient documentation

## 2021-01-21 DIAGNOSIS — Z85850 Personal history of malignant neoplasm of thyroid: Secondary | ICD-10-CM | POA: Insufficient documentation

## 2021-01-21 DIAGNOSIS — E119 Type 2 diabetes mellitus without complications: Secondary | ICD-10-CM | POA: Diagnosis not present

## 2021-01-21 DIAGNOSIS — I10 Essential (primary) hypertension: Secondary | ICD-10-CM | POA: Diagnosis not present

## 2021-01-21 DIAGNOSIS — Z7984 Long term (current) use of oral hypoglycemic drugs: Secondary | ICD-10-CM | POA: Insufficient documentation

## 2021-01-21 DIAGNOSIS — J449 Chronic obstructive pulmonary disease, unspecified: Secondary | ICD-10-CM | POA: Insufficient documentation

## 2021-01-21 DIAGNOSIS — R42 Dizziness and giddiness: Secondary | ICD-10-CM | POA: Diagnosis present

## 2021-01-21 LAB — URINALYSIS, MICROSCOPIC (REFLEX): Bacteria, UA: NONE SEEN

## 2021-01-21 LAB — CBC
HCT: 41.8 % (ref 36.0–46.0)
Hemoglobin: 13.6 g/dL (ref 12.0–15.0)
MCH: 28.9 pg (ref 26.0–34.0)
MCHC: 32.5 g/dL (ref 30.0–36.0)
MCV: 88.7 fL (ref 80.0–100.0)
Platelets: 346 10*3/uL (ref 150–400)
RBC: 4.71 MIL/uL (ref 3.87–5.11)
RDW: 12.9 % (ref 11.5–15.5)
WBC: 10.4 10*3/uL (ref 4.0–10.5)
nRBC: 0 % (ref 0.0–0.2)

## 2021-01-21 LAB — BASIC METABOLIC PANEL
Anion gap: 7 (ref 5–15)
BUN: 17 mg/dL (ref 8–23)
CO2: 26 mmol/L (ref 22–32)
Calcium: 9 mg/dL (ref 8.9–10.3)
Chloride: 101 mmol/L (ref 98–111)
Creatinine, Ser: 0.59 mg/dL (ref 0.44–1.00)
GFR, Estimated: >60 mL/min (ref >60)
Glucose, Bld: 156 mg/dL — ABNORMAL HIGH (ref 70–99)
Potassium: 3.8 mmol/L (ref 3.5–5.1)
Sodium: 134 mmol/L — ABNORMAL LOW (ref 135–145)

## 2021-01-21 LAB — URINALYSIS, ROUTINE W REFLEX MICROSCOPIC
Glucose, UA: NEGATIVE mg/dL
Ketones, ur: >80 mg/dL — AB
Leukocytes,Ua: NEGATIVE
Nitrite: NEGATIVE
Specific Gravity, Urine: >1.03 — ABNORMAL HIGH (ref 1.005–1.030)
pH: 6 (ref 5.0–8.0)

## 2021-01-21 LAB — CBG MONITORING, ED: Glucose-Capillary: 164 mg/dL — ABNORMAL HIGH (ref 70–99)

## 2021-01-21 MED ORDER — MECLIZINE HCL 25 MG PO TABS: 25.0000 mg | ORAL_TABLET | Freq: Three times a day (TID) | ORAL | 0 refills | Status: DC | PRN

## 2021-01-21 MED ORDER — MECLIZINE HCL 25 MG PO TABS
25.0000 mg | ORAL_TABLET | Freq: Once | ORAL | Status: AC
  Administered 2021-01-21: 25 mg via ORAL
  Filled 2021-01-21: qty 1

## 2021-01-21 NOTE — Discharge Instructions (Addendum)
Your urinalysis showed that you may have some element of dehydration.  Drink plenty of fluids.  Follow-up with your doctor as needed

## 2021-01-21 NOTE — ED Notes (Signed)
Pt ambulated to restroom w/o assistance. 

## 2021-01-21 NOTE — ED Notes (Signed)
[phone] Pts daughter, Verdene Lennert, would like to speak to MD once all tests/scans are back.

## 2021-01-21 NOTE — ED Notes (Signed)
Pt. Aware urine sample needed

## 2021-01-21 NOTE — ED Provider Notes (Signed)
[facility] DEPT Provider Note   CSN: [id number] Arrival date & time: 01/21/21  1258     History Chief Complaint  Patient presents with   Dizziness    Vicki [name] is a 60 y.o. female.  60 year old female presents with dizziness x3 days.  Patient states that it is worse in the morning when she wakes up.  Notes that she did change her blood pressure medication about a week ago to start taking it at night as opposed to the morning because she kept forgetting.  Does have a history of inner ear issues in the past.  States that she has not had any syncope or near syncope.  No chest pain or shortness of breath.  Has had a mild headache.  States that when she flexes her head forward it makes her symptoms worse.  Denies any severe gait instability.  No treatment use for this prior to arrival      Past Medical History:  Diagnosis Date   Acquired hallux valgus of left foot    Bladder tumor    Callous ulcer ([place])    Depression with anxiety    DEPRESSIVE DISORDER NOT ELSEWHERE CLASSIFIED    Diabetes mellitus without complication ([place])    Phreesia 10/13/2020   Dyspnea    Foot deformity, acquired    Grief reaction    History of kidney stones    Hyperlipidemia    [employer]reesia 10/13/2020   Hypertension    [employer]reesia 10/13/2020   Myalgia and myositis, unspecified    Onychomycosis    Oral thrush    Pneumonia    4 months ago   Pre-diabetes    Rotator cuff syndrome of left shoulder    Rotator cuff tear, right    Shortness of breath    SHOULDER PAIN, RIGHT    Sleep apnea    [employer]reesia 10/13/2020   Sleep disorder    Urinary hesitancy     Patient Active Problem List   Diagnosis Date Noted   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   Influenza A 09/26/2018   COPD with acute exacerbation ([place]) 09/26/2018   Neck pain 02/05/2016   Leg swelling 01/19/2016   Dry cough 02/11/2015   Fatigue 02/11/2015   Prediabetes 10/29/2014   Polyuria  10/28/2014   Hyperglycemia 10/28/2014   Left breast mass 04/11/2014   Right hip pain 03/21/2014   Edema, lower extremity 03/21/2014   Low back pain 12/17/2013   Right knee pain 12/17/2013   Chest pain, central 11/05/2013   Lateral epicondylitis of left elbow 11/05/2013   Obstructive chronic bronchitis without exacerbation ([place]) 08/23/2013   Wrist fracture, right 07/08/2013   Sciatica 05/28/2013   Grief reaction 04/02/2013   Myalgia and myositis, unspecified 03/07/2012   Rotator cuff syndrome of left shoulder 03/07/2012   Depression with anxiety 03/07/2012   Foot deformity, acquired 06/21/2011   Callous ulcer ([place]) 06/21/2011   Acquired hallux valgus of left foot 05/10/2011   Sleep disorder 02/04/2011   Rotator cuff tear, right 09/03/2010   SHOULDER PAIN, RIGHT 08/20/2010   TOBACCO ABUSE 06/11/2010   DEPRESSIVE DISORDER NOT ELSEWHERE CLASSIFIED 06/11/2010    Past Surgical History:  Procedure Laterality Date   cryotherphy  1986   TRANSURETHRAL RESECTION OF BLADDER TUMOR N/A 12/21/2020   Procedure: TRANSURETHRAL RESECTION OF BLADDER TUMOR (TURBT)WITH CYSTOSTOPY/ BILATERAL RETROGRADE PYELOGRAM WITH LEFT STENT PLACEMENT;  Surgeon: Raynelle Bring, MD;  Location: [facility];  Service: Urology;  Laterality: N/A;  GENERAL ANESTHESIA  WITHH PARALYSIS   TUBAL LIGATION       OB History     Gravida  4   Para  3   Term  3   Preterm      AB  1   Living  3      SAB      IAB  1   Ectopic      Multiple      Live Births              Family History  Problem Relation Age of Onset   Heart disease Mother    Diabetes Mother    Cancer Mother        breast and ovarian   Stroke Mother    Breast cancer Mother        diagnosed in her 74's   Heart disease Father     Social History   Tobacco Use   Smoking status: Every Day    Packs/day: 0.50    Pack years: 0.00    Types: Cigarettes   Smokeless tobacco: Never   Tobacco comments:    increased smoking again due to stress   Vaping Use   Vaping Use: Never used  Substance Use Topics   Alcohol use: No   Drug use: No    Home Medications Prior to Admission medications   Medication Sig Start Date End Date Taking? Authorizing Provider  ASPIRIN 81 PO Take 81 mg by mouth daily.    [provider]  atorvastatin (LIPITOR) 10 MG tablet Take 1 tablet (10 mg total) by mouth daily. 12/25/20   Wendie Agreste, MD  budesonide-formoterol [facility]) 160-4.5 MCG/ACT inhaler Inhale 2 puffs into the lungs 2 (two) times daily.    [provider]  Calcium Carb-Cholecalciferol (CALCIUM 600/VITAMIN D3 PO) Take 1 tablet by mouth daily.    [provider]  cyclobenzaprine (FLEXERIL) 5 MG tablet 1 pill by mouth up to every 8 hours as needed. Start with one pill by mouth each bedtime as needed due to sedation Patient not taking: Reported on 12/15/2020 10/14/20   Wendie Agreste, MD  escitalopram (LEXAPRO) 20 MG tablet Take 1 tablet (20 mg total) by mouth at bedtime. 10/14/20   Wendie Agreste, MD  hydrOXYzine (ATARAX/VISTARIL) 25 MG tablet Take 0.5-1 tablets (12.5-25 mg total) by mouth every 8 (eight) hours as needed for anxiety. 12/28/20   Wendie Agreste, MD  lisinopril (ZESTRIL) 20 MG tablet Take 20 mg by mouth daily. 11/03/20   [provider]  meloxicam (MOBIC) 7.5 MG tablet Take 1 tablet (7.5 mg total) by mouth at bedtime as needed for pain. 07/27/20   Just, Laurita Quint, FNP  metFORMIN (GLUCOPHAGE) 500 MG tablet Take 1 tablet (500 mg total) by mouth 2 (two) times daily with a meal. Patient taking differently: Take 500 mg by mouth at bedtime. 03/13/20   Jacelyn Pi, Lilia Argue, MD  phenazopyridine (PYRIDIUM) 200 MG tablet Take 1 tablet (200 mg total) by mouth 3 (three) times daily as needed for pain. 12/21/20   Raynelle Bring, MD  vitamin E 180 MG (400 UNITS) capsule Take 400 Units by mouth daily.    [provider]    Allergies    Codeine  Review of Systems   Review of Systems  All other  systems reviewed and are negative.  Physical Exam Updated Vital Signs BP (!) 160/90 (BP Location: Left Arm)   Pulse 66   Temp 98.3 F (36.8 C) (Oral)  Resp 18   SpO2 98%   Physical Exam Vitals and nursing note reviewed.  Constitutional:      General: She is not in acute distress.    Appearance: Normal appearance. She is well-developed. She is not toxic-appearing.  HENT:     Head: Normocephalic and atraumatic.  Eyes:     General: Lids are normal.     Conjunctiva/sclera: Conjunctivae normal.     Pupils: Pupils are equal, round, and reactive to light.  Neck:     Thyroid: No thyroid mass.     Trachea: No tracheal deviation.  Cardiovascular:     Rate and Rhythm: Normal rate and regular rhythm.     Heart sounds: Normal heart sounds. No murmur heard.   No gallop.  Pulmonary:     Effort: Pulmonary effort is normal. No respiratory distress.     Breath sounds: Normal breath sounds. No stridor. No decreased breath sounds, wheezing, rhonchi or rales.  Abdominal:     General: There is no distension.     Palpations: Abdomen is soft.     Tenderness: There is no abdominal tenderness. There is no rebound.  Musculoskeletal:        General: No tenderness. Normal range of motion.     Cervical back: Normal range of motion and neck supple.  Skin:    General: Skin is warm and dry.     Findings: No abrasion or rash.  Neurological:     General: No focal deficit present.     Mental Status: She is alert and oriented to person, place, and time. Mental status is at baseline.     GCS: GCS eye subscore is 4. GCS verbal subscore is 5. GCS motor subscore is 6.     Cranial Nerves: Cranial nerves are intact. No cranial nerve deficit.     Sensory: No sensory deficit.     Motor: Motor function is intact.     Coordination: Coordination is intact.     Comments: Slight horizontal nystagmus noted  Psychiatric:        Attention and Perception: Attention normal.        Speech: Speech normal.         Behavior: Behavior normal.    ED Results / Procedures / Treatments   Labs (all labs ordered are listed, but only abnormal results are displayed) Labs Reviewed  CBG MONITORING, ED - Abnormal; Notable for the following components:      Result Value   Glucose-Capillary 164 (*)    All other components within normal limits  BASIC METABOLIC PANEL  CBC  URINALYSIS, ROUTINE W REFLEX MICROSCOPIC    EKG EKG Interpretation  Date/Time:  Thursday January 21 2021 13:07:41 EDT Ventricular Rate:  68 PR Interval:  133 QRS Duration: 86 QT Interval:  414 QTC Calculation: 441 R Axis:   148 Text Interpretation: Right and left arm electrode reversal, interpretation assumes no reversal Sinus or ectopic atrial rhythm Right axis deviation Abnormal T, consider ischemia, lateral leads 12 Lead; [place] Confirmed by Lacretia Leigh ([id number]) on 01/21/2021 1:54:02 PM  Radiology No results found.  Procedures Procedures   Medications Ordered in ED Medications  meclizine (ANTIVERT) tablet 25 mg (has no administration in time range)    ED Course  I have reviewed the triage vital signs and the nursing notes.  Pertinent labs & imaging results that were available during my care of the patient were reviewed by me and considered in my medical decision making (see chart for details).  MDM Rules/Calculators/A&P                          Patient given Antivert and IV fluids and does feel better.  She is not orthostatic here.  Head CT results noted and subsequently MRI of the brain showed no evidence of stroke.  Patient states that she has had this dizziness for quite some time.  Does seem to be positional.  Suspect she has vertigo and will place on Antivert and refer back to her primary care doctor Final Clinical Impression(s) / ED Diagnoses Final diagnoses:  None    Rx / DC Orders ED Discharge Orders     None        Lacretia Leigh, MD 01/21/21 2000

## 2021-01-21 NOTE — ED Triage Notes (Signed)
PER ems-complaining of dizziness for 3 days-some nausea

## 2021-01-26 ENCOUNTER — Other Ambulatory Visit: Payer: Self-pay

## 2021-01-26 ENCOUNTER — Telehealth: Payer: Self-pay | Admitting: Family Medicine

## 2021-01-26 MED ORDER — MELOXICAM 7.5 MG PO TABS: 7.5000 mg | ORAL_TABLET | Freq: Every evening | ORAL | 0 refills | Status: DC | PRN

## 2021-01-26 NOTE — Telephone Encounter (Signed)
Short-term refill provided, keep follow up as scheduled.

## 2021-01-26 NOTE — Telephone Encounter (Signed)
Pt called in asking for a refill on the meloxicam. Pt can be reached at the home # and she has an appt with Carlota Raspberry on 01/28/21.

## 2021-01-26 NOTE — Telephone Encounter (Signed)
Request sent to pcp for approval.

## 2021-01-26 NOTE — Telephone Encounter (Signed)
Pt called in asking for a refill on the meloxicam. Pt can be reached at the home # and she has an appt with Carlota Raspberry on 01/28/21 LFD 07/27/20 #30 with 3 refills LOV 10/15/20 NOV 01/28/21

## 2021-01-28 ENCOUNTER — Ambulatory Visit: Payer: 59 | Admitting: Family Medicine

## 2021-02-04 ENCOUNTER — Other Ambulatory Visit: Payer: Self-pay | Admitting: Family Medicine

## 2021-02-04 ENCOUNTER — Other Ambulatory Visit: Payer: Self-pay | Admitting: Critical Care Medicine

## 2021-02-04 DIAGNOSIS — Z1231 Encounter for screening mammogram for malignant neoplasm of breast: Secondary | ICD-10-CM

## 2021-02-15 ENCOUNTER — Ambulatory Visit (INDEPENDENT_AMBULATORY_CARE_PROVIDER_SITE_OTHER): Payer: 59 | Admitting: Family Medicine

## 2021-02-15 ENCOUNTER — Other Ambulatory Visit: Payer: Self-pay

## 2021-02-15 VITALS — BP 138/78 | HR 74 | Temp 98.3°F | Resp 16 | Ht 64.0 in | Wt 163.4 lb

## 2021-02-15 DIAGNOSIS — R7303 Prediabetes: Secondary | ICD-10-CM | POA: Diagnosis not present

## 2021-02-15 DIAGNOSIS — R5383 Other fatigue: Secondary | ICD-10-CM

## 2021-02-15 DIAGNOSIS — M25561 Pain in right knee: Secondary | ICD-10-CM

## 2021-02-15 DIAGNOSIS — E785 Hyperlipidemia, unspecified: Secondary | ICD-10-CM | POA: Diagnosis not present

## 2021-02-15 DIAGNOSIS — F418 Other specified anxiety disorders: Secondary | ICD-10-CM | POA: Diagnosis not present

## 2021-02-15 DIAGNOSIS — I10 Essential (primary) hypertension: Secondary | ICD-10-CM | POA: Diagnosis not present

## 2021-02-15 DIAGNOSIS — G8929 Other chronic pain: Secondary | ICD-10-CM

## 2021-02-15 MED ORDER — MELOXICAM 7.5 MG PO TABS: 7.5000 mg | ORAL_TABLET | Freq: Every evening | ORAL | 2 refills | Status: DC | PRN

## 2021-02-15 MED ORDER — METFORMIN HCL 500 MG PO TABS: 500.0000 mg | ORAL_TABLET | Freq: Every day | ORAL | 1 refills | Status: DC

## 2021-02-15 MED ORDER — ATORVASTATIN CALCIUM 10 MG PO TABS: 10.0000 mg | ORAL_TABLET | Freq: Every day | ORAL | 1 refills | Status: DC

## 2021-02-15 MED ORDER — ESCITALOPRAM OXALATE 20 MG PO TABS: 20.0000 mg | ORAL_TABLET | Freq: Every day | ORAL | 1 refills | Status: DC

## 2021-02-15 NOTE — Progress Notes (Signed)
Subjective:  Patient ID: Vicki [name], female    DOB: [birth year]  Age: 60 y.o. MRN: [id number]  CC:  Chief Complaint  Patient presents with   Hypertension    Pt denies headaches, has had some dizzy spells seen in ED 1 month ago since better believes related to stress. Due for refills    Diabetes    Pt here for recheck and refill or medications doing well no concerns today    Hyperlipidemia    Recheck labs and refill medications    Immunizations    Pt due for shingles and pneumonia, pt is willing to discuss having these done.     HPI Vicki [name] presents for   Hypertension: Seen in ER June 16 with dizziness for a few days.  Had adjusted the timing of her blood pressure medications.  Blood sugar 164, 156.  Sodium 134.  Normal hemoglobin on CBC.  CT head negative for acute hemorrhage.  There was a slight asymmetrical hypodensity within the right anterior white matter subinsula and right basal ganglia indeterminate for small vessel ischemic change versus age-indeterminate infarct.   MRI brain indicated no acute abnormality and mild chronic microvascular ischemic changes in the white matter, right basal ganglia.  Slight horizontal nystagmus noted on exam.  Treated with IV fluids, meclizine and felt better. No further dizziness. Thought to be due to stress at that time.   Lisinopril 20mg  qd.  Home readings: variable - higher when stressed.  BP Readings from Last 3 Encounters:  02/15/21 138/78  01/21/21 (!) 171/84  12/21/20 (!) 161/84   Lab Results  Component Value Date   CREATININE 0.59 01/21/2021   Anxiety, generalized anxiety Treated with Lexapro 20 mg daily.  Improved on 20 mg dose when discussed in March.  Hydroxyzine as needed for anxiety.  Currently under treatment for bladder cancer with Dr. Alinda Money. Recent stressors - grandchild in foster care, trying to get her back. [place], [place]. Court hearing on the 18th.  Hydroxyzine - rare use- caused sleepiness.  Tired  at night with chemo treatments.  Declines therapist.  Sleepy all the time.using cpap nightly past 3-4 months.   Depression screen [facility] 2/9 02/15/2021 10/14/2020 06/01/2020 04/30/2020 03/13/2020  Decreased Interest 1 1 3  0 1  Down, Depressed, Hopeless 0 1 1 0 1  PHQ - 2 Score 1 2 4  0 2  Altered sleeping 2 3 1  - 3  Tired, decreased energy 3 3 2  - 3  Change in appetite 1 2 2  - 0  Feeling bad or failure about yourself  0 0 1 - 1  Trouble concentrating 0 0 1 - 0  Moving slowly or fidgety/restless 2 3 2  - 1  Suicidal thoughts 0 0 0 - 0  PHQ-9 Score 9 13 13  - 10  Difficult doing work/chores - - Not difficult at all - Not difficult at all  Some recent data might be hidden    Depression screen [facility] 2/9 02/15/2021 10/14/2020 06/01/2020 04/30/2020 03/13/2020  Decreased Interest 1 1 3  0 1  Down, Depressed, Hopeless 0 1 1 0 1  PHQ - 2 Score 1 2 4  0 2  Altered sleeping 2 3 1  - 3  Tired, decreased energy 3 3 2  - 3  Change in appetite 1 2 2  - 0  Feeling bad or failure about yourself  0 0 1 - 1  Trouble concentrating 0 0 1 - 0  Moving slowly or fidgety/restless 2 3 2  - 1  Suicidal thoughts 0 0 0 - 0  PHQ-9 Score 9 13 13  - 10  Difficult doing work/chores - - Not difficult at all - Not difficult at all  Some recent data might be hidden   Prediabetes: Treated with metformin 500 mg daily.  Home readings 114. No new side effects.  Lab Results  Component Value Date   HGBA1C 6.3 (H) 12/21/2020   HGBA1C 6.1 (H) 10/15/2020   HGBA1C 6.4 (H) 06/01/2020   Lab Results  Component Value Date   LDLCALC 108 (H) 10/15/2020   CREATININE 0.59 01/21/2021   Hyperlipidemia: Lipitor 10mg  qd - no new side effects.  Lab Results  Component Value Date   CHOL 180 10/15/2020   HDL 47 10/15/2020   LDLCALC 108 (H) 10/15/2020   LDLDIRECT 114 (H) 06/11/2010   TRIG 143 10/15/2020   CHOLHDL 3.8 10/15/2020   Lab Results  Component Value Date   ALT 16 10/15/2020   AST 14 10/15/2020   ALKPHOS 82 10/15/2020   BILITOT 0.3  10/15/2020   R knee pain, arthritis: Takes mobic nightly. No hx of PUD.  Prior eval by ortho - Dr Erlinda Hong in May 2021, injection given at that time - helped few weeks.   HM: Due for shingrix and pneumonia vaccine, on chemo for another 3 weeks. Deferred.  Recommended covid booster.   History Patient Active Problem List   Diagnosis Date Noted   Cancer of trigone of urinary bladder ([place]) 01/15/2021   Influenza A 09/26/2018   COPD with acute exacerbation ([place]) 09/26/2018   Neck pain 02/05/2016   Leg swelling 01/19/2016   Dry cough 02/11/2015   Fatigue 02/11/2015   Prediabetes 10/29/2014   Polyuria 10/28/2014   Hyperglycemia 10/28/2014   Left breast mass 04/11/2014   Right hip pain 03/21/2014   Edema, lower extremity 03/21/2014   Low back pain 12/17/2013   Right knee pain 12/17/2013   Chest pain, central 11/05/2013   Lateral epicondylitis of left elbow 11/05/2013   Obstructive chronic bronchitis without exacerbation ([place]) 08/23/2013   Wrist fracture, right 07/08/2013   Sciatica 05/28/2013   Grief reaction 04/02/2013   Myalgia and myositis, unspecified 03/07/2012   Rotator cuff syndrome of left shoulder 03/07/2012   Depression with anxiety 03/07/2012   Foot deformity, acquired 06/21/2011   Callous ulcer ([place]) 06/21/2011   Acquired hallux valgus of left foot 05/10/2011   Sleep disorder 02/04/2011   Rotator cuff tear, right 09/03/2010   SHOULDER PAIN, RIGHT 08/20/2010   TOBACCO ABUSE 06/11/2010   DEPRESSIVE DISORDER NOT ELSEWHERE CLASSIFIED 06/11/2010   Past Medical History:  Diagnosis Date   Acquired hallux valgus of left foot    Bladder tumor    Callous ulcer ([place])    Depression with anxiety    DEPRESSIVE DISORDER NOT ELSEWHERE CLASSIFIED    Diabetes mellitus without complication ([place])    Phreesia 10/13/2020   Dyspnea    Foot deformity, acquired    Grief reaction    History of kidney stones    Hyperlipidemia    Phreesia 10/13/2020   Hypertension    [employer]reesia  10/13/2020   Myalgia and myositis, unspecified    Onychomycosis    Oral thrush    Pneumonia    4 months ago   Pre-diabetes    Rotator cuff syndrome of left shoulder    Rotator cuff tear, right    Shortness of breath    SHOULDER PAIN, RIGHT    Sleep apnea    [employer]reesia 10/13/2020   Sleep  disorder    Urinary hesitancy    Past Surgical History:  Procedure Laterality Date   cryotherphy  1986   TRANSURETHRAL RESECTION OF BLADDER TUMOR N/A 12/21/2020   Procedure: TRANSURETHRAL RESECTION OF BLADDER TUMOR (TURBT)WITH CYSTOSTOPY/ BILATERAL RETROGRADE PYELOGRAM WITH LEFT STENT PLACEMENT;  Surgeon: Raynelle Bring, MD;  Location: [facility];  Service: Urology;  Laterality: N/A;  GENERAL ANESTHESIA WITHH PARALYSIS   TUBAL LIGATION     Allergies  Allergen Reactions   Codeine Nausea And Vomiting   Prior to Admission medications   Medication Sig Start Date End Date Taking? Authorizing Provider  ASPIRIN 81 PO Take 81 mg by mouth daily.   Yes [provider]  atorvastatin (LIPITOR) 10 MG tablet Take 1 tablet (10 mg total) by mouth daily. 12/25/20  Yes Wendie Agreste, MD  budesonide-formoterol [facility]) 160-4.5 MCG/ACT inhaler Inhale 2 puffs into the lungs 2 (two) times daily.   Yes [provider]  Calcium Carb-Cholecalciferol (CALCIUM 600/VITAMIN D3 PO) Take 1 tablet by mouth daily.   Yes [provider]  escitalopram (LEXAPRO) 20 MG tablet Take 1 tablet (20 mg total) by mouth at bedtime. 10/14/20  Yes Wendie Agreste, MD  hydrOXYzine (ATARAX/VISTARIL) 25 MG tablet Take 0.5-1 tablets (12.5-25 mg total) by mouth every 8 (eight) hours as needed for anxiety. 12/28/20  Yes Wendie Agreste, MD  lisinopril (ZESTRIL) 20 MG tablet Take 20 mg by mouth daily. 11/03/20  Yes [provider]  meloxicam (MOBIC) 7.5 MG tablet Take 1 tablet (7.5 mg total) by mouth at bedtime as needed for pain. 01/26/21  Yes Wendie Agreste, MD  metFORMIN (GLUCOPHAGE) 500 MG tablet Take 1 tablet  (500 mg total) by mouth 2 (two) times daily with a meal. Patient taking differently: Take 500 mg by mouth at bedtime. 03/13/20  Yes Jacelyn Pi, Lilia Argue, MD  phenazopyridine (PYRIDIUM) 200 MG tablet Take 1 tablet (200 mg total) by mouth 3 (three) times daily as needed for pain. 12/21/20  Yes Raynelle Bring, MD  vitamin E 180 MG (400 UNITS) capsule Take 400 Units by mouth daily.   Yes [provider]   Social History   Socioeconomic History   Marital status: Widowed    Spouse name: Not on file   Number of children: Not on file   Years of education: Not on file   Highest education level: Not on file  Occupational History   Not on file  Tobacco Use   Smoking status: Every Day    Packs/day: 0.50    Pack years: 0.00    Types: Cigarettes   Smokeless tobacco: Never   Tobacco comments:    increased smoking again due to stress  Vaping Use   Vaping Use: Never used  Substance and Sexual Activity   Alcohol use: No   Drug use: No   Sexual activity: Never    Birth control/protection: Surgical, Post-menopausal  Other Topics Concern   Not on file  Social History Narrative   Not on file   Social Determinants of Health   Financial Resource Strain: Not on file  Food Insecurity: Not on file  Transportation Needs: Not on file  Physical Activity: Not on file  Stress: Not on file  Social Connections: Not on file  Intimate Partner Violence: Not on file    Review of Systems  Constitutional:  Positive for fatigue. Negative for unexpected weight change.  Respiratory:  Negative for chest tightness and shortness of breath (chronic).   Cardiovascular:  Negative  for chest pain, palpitations and leg swelling.  Gastrointestinal:  Negative for abdominal pain and blood in stool.  Skin:        Cyst on back ,nontender, no redness,d/c.   Neurological:  Negative for dizziness, syncope, light-headedness and headaches.    Objective:   Vitals:   02/15/21 1057  BP: 138/78  Pulse: 74  Resp:  16  Temp: 98.3 F (36.8 C)  TempSrc: Temporal  SpO2: 95%  Weight: 163 lb 6.4 oz (74.1 kg)  Height: 5\' 4"  (1.626 m)    Physical Exam Vitals reviewed.  Constitutional:      Appearance: Normal appearance. She is well-developed.  HENT:     Head: Normocephalic and atraumatic.  Eyes:     Conjunctiva/sclera: Conjunctivae normal.     Pupils: Pupils are equal, round, and reactive to light.  Neck:     Vascular: No carotid bruit.  Cardiovascular:     Rate and Rhythm: Normal rate and regular rhythm.     Heart sounds: Normal heart sounds.  Pulmonary:     Effort: Pulmonary effort is normal.     Breath sounds: Normal breath sounds.  Abdominal:     Palpations: Abdomen is soft. There is no pulsatile mass.     Tenderness: There is no abdominal tenderness.  Musculoskeletal:     Right lower leg: No edema.     Left lower leg: No edema.  Skin:    General: Skin is warm and dry.     Comments: Wall cystic-appearing structure at the right upper posterior back, with small opening noted.  No erythema, nontender, soft.  No discharge.  Neurological:     Mental Status: She is alert and oriented to person, place, and time.  Psychiatric:        Mood and Affect: Mood normal.        Behavior: Behavior normal.        Thought Content: Thought content normal.        Assessment & Plan:  Vicki [name] is a 60 y.o. female . Essential hypertension  -Variable home readings.  Handout given on management of hypertension including goal readings.  Check blood pressure at rest, with RTC precautions, no med changes for now.  Depression with anxiety  -Family stress as above, discussed meeting with therapist.  Decided against med changes at this time.  RTC precautions  Prediabetes Watch diet, exercise, lifestyle modification.  Continue metformin.  Not yet due for A1c, recheck next 3 months.  Hyperlipidemia, unspecified hyperlipidemia type  -Tolerating current regimen, not quite due for labs yet.  No  changes for now.  Chronic pain of right knee  -Refill meloxicam, but recommended follow-up with orthopedist to decide on definitive care.  Fatigue, unspecified type  -May be multifactorial with situational stress, on CPAP for OSA.  Recommended discussing CPAP and settings with sleep specialist.  Therapy recommended as above, but deferred at this time for her situational/family stressors.  RTC precautions if acute changes or worsening.  Recheck next few months.  No orders of the defined types were placed in this encounter.  Patient Instructions  I would consider meeting with therapist for anxiety.  I recommend discussing the sleepiness with your sleep specialist to make sure CPAP working effectively.  I will refill mobic for knee pain for now, but recommend following up with orthopaedist Dr. Erlinda Hong in the next month or two to discuss plan for your knee.  I would like to get you off Meloxicam if possible.  Managing Your Hypertension Hypertension, also called high blood pressure, is when the force of the blood pressing against the walls of the arteries is too strong. Arteries are blood vessels that carry blood from your heart throughout your body. Hypertension forces the heart to work harder to pump blood and may cause the arteries tobecome narrow or stiff. Understanding blood pressure readings Your personal target blood pressure may vary depending on your medical conditions, your age, and other factors. A blood pressure reading includes a higher number over a lower number. Ideally, your blood pressure should be below 120/80. You should know that: The first, or top, number is called the systolic pressure. It is a measure of the pressure in your arteries as your heart beats. The second, or bottom number, is called the diastolic pressure. It is a measure of the pressure in your arteries as the heart relaxes. Blood pressure is classified into four stages. Based on your blood pressure reading, your  health care provider may use the following stages to determine what type of treatment you need, if any. Systolic pressure and diastolicpressure are measured in a unit called mmHg. Normal Systolic pressure: below 485. Diastolic pressure: below 80. Elevated Systolic pressure: 462-703. Diastolic pressure: below 80. Hypertension stage 1 Systolic pressure: 500-938. Diastolic pressure: 18-29. Hypertension stage 2 Systolic pressure: 937 or above. Diastolic pressure: 90 or above. How can this condition affect me? Managing your hypertension is an important responsibility. Over time, hypertension can damage the arteries and decrease blood flow to important parts of the body, including the brain, heart, and kidneys. Having untreated or uncontrolled hypertension can lead to: A heart attack. A stroke. A weakened blood vessel (aneurysm). Heart failure. Kidney damage. Eye damage. Metabolic syndrome. Memory and concentration problems. Vascular dementia. What actions can I take to manage this condition? Hypertension can be managed by making lifestyle changes and possibly by taking medicines. Your health care provider will help you make a plan to bring yourblood pressure within a normal range. Nutrition  Eat a diet that is high in fiber and potassium, and low in salt (sodium), added sugar, and fat. An example eating plan is called the Dietary Approaches to Stop Hypertension (DASH) diet. To eat this way: Eat plenty of fresh fruits and vegetables. Try to fill one-half of your plate at each meal with fruits and vegetables. Eat whole grains, such as whole-wheat pasta, brown rice, or whole-grain bread. Fill about one-fourth of your plate with whole grains. Eat low-fat dairy products. Avoid fatty cuts of meat, processed or cured meats, and poultry with skin. Fill about one-fourth of your plate with lean proteins such as fish, chicken without skin, beans, eggs, and tofu. Avoid pre-made and processed foods.  These tend to be higher in sodium, added sugar, and fat. Reduce your daily sodium intake. Most people with hypertension should eat less than 1,500 mg of sodium a day.  Lifestyle  Work with your health care provider to maintain a healthy body weight or to lose weight. Ask what an ideal weight is for you. Get at least 30 minutes of exercise that causes your heart to beat faster (aerobic exercise) most days of the week. Activities may include walking, swimming, or biking. Include exercise to strengthen your muscles (resistance exercise), such as weight lifting, as part of your weekly exercise routine. Try to do these types of exercises for 30 minutes at least 3 days a week. Do not use any products that contain nicotine or tobacco, such as cigarettes, e-cigarettes, and  chewing tobacco. If you need help quitting, ask your health care provider. Control any long-term (chronic) conditions you have, such as high cholesterol or diabetes. Identify your sources of stress and find ways to manage stress. This may include meditation, deep breathing, or making time for fun activities.  Alcohol use Do not drink alcohol if: Your health care provider tells you not to drink. You are pregnant, may be pregnant, or are planning to become pregnant. If you drink alcohol: Limit how much you use to: 0-1 drink a day for women. 0-2 drinks a day for men. Be aware of how much alcohol is in your drink. In the [place] one drink equals one 12 oz bottle of beer (355 mL), one 5 oz glass of wine (148 mL), or one 1 oz glass of hard liquor (44 mL). Medicines Your health care provider may prescribe medicine if lifestyle changes are not enough to get your blood pressure under control and if: Your systolic blood pressure is 130 or higher. Your diastolic blood pressure is 80 or higher. Take medicines only as told by your health care provider. Follow the directions carefully. Blood pressure medicines must be taken as told by your  health care provider. The medicine does not work as well when you skip doses. Skippingdoses also puts you at risk for problems. Monitoring Before you monitor your blood pressure: Do not smoke, drink caffeinated beverages, or exercise within 30 minutes before taking a measurement. Use the bathroom and empty your bladder (urinate). Sit quietly for at least 5 minutes before taking measurements. Monitor your blood pressure at home as told by your health care provider. To do this: Sit with your back straight and supported. Place your feet flat on the floor. Do not cross your legs. Support your arm on a flat surface, such as a table. Make sure your upper arm is at heart level. Each time you measure, take two or three readings one minute apart and record the results. You may also need to have your blood pressure checked regularly by your healthcare provider. General information Talk with your health care provider about your diet, exercise habits, and other lifestyle factors that may be contributing to hypertension. Review all the medicines you take with your health care provider because there may be side effects or interactions. Keep all visits as told by your health care provider. Your health care provider can help you create and adjust your plan for managing your high blood pressure. Where to find more information National Heart, Lung, and Blood Institute: https://wilson-eaton.com/ American Heart Association: www.heart.org Contact a health care provider if: You think you are having a reaction to medicines you have taken. You have repeated (recurrent) headaches. You feel dizzy. You have swelling in your ankles. You have trouble with your vision. Get help right away if: You develop a severe headache or confusion. You have unusual weakness or numbness, or you feel faint. You have severe pain in your chest or abdomen. You vomit repeatedly. You have trouble breathing. These symptoms may represent a  serious problem that is an emergency. Do not wait to see if the symptoms will go away. Get medical help right away. Call your local emergency services (911 in the [place] Do not drive yourself to the hospital. Summary Hypertension is when the force of blood pumping through your arteries is too strong. If this condition is not controlled, it may put you at risk for serious complications. Your personal target blood pressure may vary depending on your medical  conditions, your age, and other factors. For most people, a normal blood pressure is less than 120/80. Hypertension is managed by lifestyle changes, medicines, or both. Lifestyle changes to help manage hypertension include losing weight, eating a healthy, low-sodium diet, exercising more, stopping smoking, and limiting alcohol. This information is not intended to replace advice given to you by your health care provider. Make sure you discuss any questions you have with your healthcare provider. Document Revised: 08/30/2019 Document Reviewed: 06/25/2019 Elsevier Patient Education  2022 [place], Adult After being diagnosed with an anxiety disorder, you may be relieved to know why you have felt or behaved a certain way. You may also feel overwhelmed about the treatment ahead and what it will mean for your life. With care and support, youcan manage this condition and recover from it. How to manage lifestyle changes Managing stress and anxiety  Stress is your body's reaction to life changes and events, both good and bad. Most stress will last just a few hours, but stress can be ongoing and can lead to more than just stress. Although stress can play a major role in anxiety, it is not the same as anxiety. Stress is usually caused by something external, such as a deadline, test, or competition. Stress normally passes after thetriggering event has ended.  Anxiety is caused by something internal, such as imagining a terrible outcome  or worrying that something will go wrong that will devastate you. Anxiety often does not go away even after the triggering event is over, and it can become long-term (chronic) worry. It is important to understand the differences between stress and anxiety and to manage your stress effectively so that it does not lead to ananxious response. Talk with your health care provider or a counselor to learn more about reducing anxiety and stress. He or she may suggest tension reduction techniques, such as: Music therapy. This can include creating or listening to music that you enjoy and that inspires you. Mindfulness-based meditation. This involves being aware of your normal breaths while not trying to control your breathing. It can be done while sitting or walking. Centering prayer. This involves focusing on a word, phrase, or sacred image that means something to you and brings you peace. Deep breathing. To do this, expand your stomach and inhale slowly through your nose. Hold your breath for 3-5 seconds. Then exhale slowly, letting your stomach muscles relax. Self-talk. This involves identifying thought patterns that lead to anxiety reactions and changing those patterns. Muscle relaxation. This involves tensing muscles and then relaxing them. Choose a tension reduction technique that suits your lifestyle and personality. These techniques take time and practice. Set aside 5-15 minutes a day to do them. Therapists can offer counseling and training in these techniques. The training to help with anxiety may be covered by some insurance plans. Other things you can do to manage stress and anxiety include: Keeping a stress/anxiety diary. This can help you learn what triggers your reaction and then learn ways to manage your response. Thinking about how you react to certain situations. You may not be able to control everything, but you can control your response. Making time for activities that help you relax and not  feeling guilty about spending your time in this way. Visual imagery and yoga can help you stay calm and relax.  Medicines Medicines can help ease symptoms. Medicines for anxiety include: Anti-anxiety drugs. Antidepressants. Medicines are often used as a primary treatment for anxiety disorder. Medicines  will be prescribed by a health care provider. When used together, medicines, psychotherapy, and tension reduction techniques may be the most effectivetreatment. Relationships Relationships can play a big part in helping you recover. Try to spend more time connecting with trusted friends and family members. Consider going to couples counseling, taking family education classes, or going to familytherapy. Therapy can help you and others better understand your condition. How to recognize changes in your anxiety Everyone responds differently to treatment for anxiety. Recovery from anxiety happens when symptoms decrease and stop interfering with your daily activities at home or work. This may mean that you will start to: Have better concentration and focus. Worry will interfere less in your daily thinking. Sleep better. Be less irritable. Have more energy. Have improved memory. It is important to recognize when your condition is getting worse. Contact your health care provider if your symptoms interfere with home or work and you feellike your condition is not improving. Follow these instructions at home: Activity Exercise. Most adults should do the following: Exercise for at least 150 minutes each week. The exercise should increase your heart rate and make you sweat (moderate-intensity exercise). Strengthening exercises at least twice a week. Get the right amount and quality of sleep. Most adults need 7-9 hours of sleep each night. Lifestyle  Eat a healthy diet that includes plenty of vegetables, fruits, whole grains, low-fat dairy products, and lean protein. Do not eat a lot of foods that are high  in solid fats, added sugars, or salt. Make choices that simplify your life. Do not use any products that contain nicotine or tobacco, such as cigarettes, e-cigarettes, and chewing tobacco. If you need help quitting, ask your health care provider. Avoid caffeine, alcohol, and certain over-the-counter cold medicines. These may make you feel worse. Ask your pharmacist which medicines to avoid.  General instructions Take over-the-counter and prescription medicines only as told by your health care provider. Keep all follow-up visits as told by your health care provider. This is important. Where to find support You can get help and support from these sources: Self-help groups. Online and [employer]. A trusted spiritual leader. Couples counseling. Family education classes. Family therapy. Where to find more information You may find that joining a support group helps you deal with your anxiety. The following sources can help you locate counselors or support groups near you: [employer]: www.mentalhealthamerica.net Anxiety and Depression Association of [place] (ADAA): https://www.clark.net/ National Alliance on Mental Illness (NAMI): www.nami.org Contact a health care provider if you: Have a hard time staying focused or finishing daily tasks. Spend many hours a day feeling worried about everyday life. Become exhausted by worry. Start to have headaches, feel tense, or have nausea. Urinate more than normal. Have diarrhea. Get help right away if you have: A racing heart and shortness of breath. Thoughts of hurting yourself or others. If you ever feel like you may hurt yourself or others, or have thoughts about taking your own life, get help right away. You can go to your nearest emergency department or call: Your local emergency services (911 in the [place] A suicide crisis helpline, such as the [place] at 331-540-0740. This is open 24 hours a  day. Summary Taking steps to learn and use tension reduction techniques can help calm you and help prevent triggering an anxiety reaction. When used together, medicines, psychotherapy, and tension reduction techniques may be the most effective treatment. Family, friends, and partners can play a big part in helping  you recover from an anxiety disorder. This information is not intended to replace advice given to you by your health care provider. Make sure you discuss any questions you have with your healthcare provider. Document Revised: 12/25/2018 Document Reviewed: 12/25/2018 Elsevier Patient Education  2022 [place],   Merri Ray, MD [place], [place] [facility] 02/15/21 12:08 PM

## 2021-02-15 NOTE — Patient Instructions (Addendum)
I would consider meeting with therapist for anxiety.  I recommend discussing the sleepiness with your sleep specialist to make sure CPAP working effectively.  I will refill mobic for knee pain for now, but recommend following up with orthopaedist Dr. Erlinda Hong in the next month or two to discuss plan for your knee.  I would like to get you off Meloxicam if possible.    Managing Your Hypertension Hypertension, also called high blood pressure, is when the force of the blood pressing against the walls of the arteries is too strong. Arteries are blood vessels that carry blood from your heart throughout your body. Hypertension forces the heart to work harder to pump blood and may cause the arteries tobecome narrow or stiff. Understanding blood pressure readings Your personal target blood pressure may vary depending on your medical conditions, your age, and other factors. A blood pressure reading includes a higher number over a lower number. Ideally, your blood pressure should be below 120/80. You should know that: The first, or top, number is called the systolic pressure. It is a measure of the pressure in your arteries as your heart beats. The second, or bottom number, is called the diastolic pressure. It is a measure of the pressure in your arteries as the heart relaxes. Blood pressure is classified into four stages. Based on your blood pressure reading, your health care provider may use the following stages to determine what type of treatment you need, if any. Systolic pressure and diastolicpressure are measured in a unit called mmHg. Normal Systolic pressure: below 161. Diastolic pressure: below 80. Elevated Systolic pressure: 096-045. Diastolic pressure: below 80. Hypertension stage 1 Systolic pressure: 409-811. Diastolic pressure: 91-47. Hypertension stage 2 Systolic pressure: 829 or above. Diastolic pressure: 90 or above. How can this condition affect me? Managing your hypertension is an  important responsibility. Over time, hypertension can damage the arteries and decrease blood flow to important parts of the body, including the brain, heart, and kidneys. Having untreated or uncontrolled hypertension can lead to: A heart attack. A stroke. A weakened blood vessel (aneurysm). Heart failure. Kidney damage. Eye damage. Metabolic syndrome. Memory and concentration problems. Vascular dementia. What actions can I take to manage this condition? Hypertension can be managed by making lifestyle changes and possibly by taking medicines. Your health care provider will help you make a plan to bring yourblood pressure within a normal range. Nutrition  Eat a diet that is high in fiber and potassium, and low in salt (sodium), added sugar, and fat. An example eating plan is called the Dietary Approaches to Stop Hypertension (DASH) diet. To eat this way: Eat plenty of fresh fruits and vegetables. Try to fill one-half of your plate at each meal with fruits and vegetables. Eat whole grains, such as whole-wheat pasta, brown rice, or whole-grain bread. Fill about one-fourth of your plate with whole grains. Eat low-fat dairy products. Avoid fatty cuts of meat, processed or cured meats, and poultry with skin. Fill about one-fourth of your plate with lean proteins such as fish, chicken without skin, beans, eggs, and tofu. Avoid pre-made and processed foods. These tend to be higher in sodium, added sugar, and fat. Reduce your daily sodium intake. Most people with hypertension should eat less than 1,500 mg of sodium a day.  Lifestyle  Work with your health care provider to maintain a healthy body weight or to lose weight. Ask what an ideal weight is for you. Get at least 30 minutes of exercise that causes your heart  to beat faster (aerobic exercise) most days of the week. Activities may include walking, swimming, or biking. Include exercise to strengthen your muscles (resistance exercise), such as  weight lifting, as part of your weekly exercise routine. Try to do these types of exercises for 30 minutes at least 3 days a week. Do not use any products that contain nicotine or tobacco, such as cigarettes, e-cigarettes, and chewing tobacco. If you need help quitting, ask your health care provider. Control any long-term (chronic) conditions you have, such as high cholesterol or diabetes. Identify your sources of stress and find ways to manage stress. This may include meditation, deep breathing, or making time for fun activities.  Alcohol use Do not drink alcohol if: Your health care provider tells you not to drink. You are pregnant, may be pregnant, or are planning to become pregnant. If you drink alcohol: Limit how much you use to: 0-1 drink a day for women. 0-2 drinks a day for men. Be aware of how much alcohol is in your drink. In the [place] one drink equals one 12 oz bottle of beer (355 mL), one 5 oz glass of wine (148 mL), or one 1 oz glass of hard liquor (44 mL). Medicines Your health care provider may prescribe medicine if lifestyle changes are not enough to get your blood pressure under control and if: Your systolic blood pressure is 130 or higher. Your diastolic blood pressure is 80 or higher. Take medicines only as told by your health care provider. Follow the directions carefully. Blood pressure medicines must be taken as told by your health care provider. The medicine does not work as well when you skip doses. Skippingdoses also puts you at risk for problems. Monitoring Before you monitor your blood pressure: Do not smoke, drink caffeinated beverages, or exercise within 30 minutes before taking a measurement. Use the bathroom and empty your bladder (urinate). Sit quietly for at least 5 minutes before taking measurements. Monitor your blood pressure at home as told by your health care provider. To do this: Sit with your back straight and supported. Place your feet flat on the  floor. Do not cross your legs. Support your arm on a flat surface, such as a table. Make sure your upper arm is at heart level. Each time you measure, take two or three readings one minute apart and record the results. You may also need to have your blood pressure checked regularly by your healthcare provider. General information Talk with your health care provider about your diet, exercise habits, and other lifestyle factors that may be contributing to hypertension. Review all the medicines you take with your health care provider because there may be side effects or interactions. Keep all visits as told by your health care provider. Your health care provider can help you create and adjust your plan for managing your high blood pressure. Where to find more information National Heart, Lung, and Blood Institute: https://wilson-eaton.com/ American Heart Association: www.heart.org Contact a health care provider if: You think you are having a reaction to medicines you have taken. You have repeated (recurrent) headaches. You feel dizzy. You have swelling in your ankles. You have trouble with your vision. Get help right away if: You develop a severe headache or confusion. You have unusual weakness or numbness, or you feel faint. You have severe pain in your chest or abdomen. You vomit repeatedly. You have trouble breathing. These symptoms may represent a serious problem that is an emergency. Do not wait to see if  the symptoms will go away. Get medical help right away. Call your local emergency services (911 in the [place] Do not drive yourself to the hospital. Summary Hypertension is when the force of blood pumping through your arteries is too strong. If this condition is not controlled, it may put you at risk for serious complications. Your personal target blood pressure may vary depending on your medical conditions, your age, and other factors. For most people, a normal blood pressure is less than  120/80. Hypertension is managed by lifestyle changes, medicines, or both. Lifestyle changes to help manage hypertension include losing weight, eating a healthy, low-sodium diet, exercising more, stopping smoking, and limiting alcohol. This information is not intended to replace advice given to you by your health care provider. Make sure you discuss any questions you have with your healthcare provider. Document Revised: 08/30/2019 Document Reviewed: 06/25/2019 Elsevier Patient Education  2022 [place], Adult After being diagnosed with an anxiety disorder, you may be relieved to know why you have felt or behaved a certain way. You may also feel overwhelmed about the treatment ahead and what it will mean for your life. With care and support, youcan manage this condition and recover from it. How to manage lifestyle changes Managing stress and anxiety  Stress is your body's reaction to life changes and events, both good and bad. Most stress will last just a few hours, but stress can be ongoing and can lead to more than just stress. Although stress can play a major role in anxiety, it is not the same as anxiety. Stress is usually caused by something external, such as a deadline, test, or competition. Stress normally passes after thetriggering event has ended.  Anxiety is caused by something internal, such as imagining a terrible outcome or worrying that something will go wrong that will devastate you. Anxiety often does not go away even after the triggering event is over, and it can become long-term (chronic) worry. It is important to understand the differences between stress and anxiety and to manage your stress effectively so that it does not lead to ananxious response. Talk with your health care provider or a counselor to learn more about reducing anxiety and stress. He or she may suggest tension reduction techniques, such as: Music therapy. This can include creating or  listening to music that you enjoy and that inspires you. Mindfulness-based meditation. This involves being aware of your normal breaths while not trying to control your breathing. It can be done while sitting or walking. Centering prayer. This involves focusing on a word, phrase, or sacred image that means something to you and brings you peace. Deep breathing. To do this, expand your stomach and inhale slowly through your nose. Hold your breath for 3-5 seconds. Then exhale slowly, letting your stomach muscles relax. Self-talk. This involves identifying thought patterns that lead to anxiety reactions and changing those patterns. Muscle relaxation. This involves tensing muscles and then relaxing them. Choose a tension reduction technique that suits your lifestyle and personality. These techniques take time and practice. Set aside 5-15 minutes a day to do them. Therapists can offer counseling and training in these techniques. The training to help with anxiety may be covered by some insurance plans. Other things you can do to manage stress and anxiety include: Keeping a stress/anxiety diary. This can help you learn what triggers your reaction and then learn ways to manage your response. Thinking about how you react to certain situations. You may not  be able to control everything, but you can control your response. Making time for activities that help you relax and not feeling guilty about spending your time in this way. Visual imagery and yoga can help you stay calm and relax.  Medicines Medicines can help ease symptoms. Medicines for anxiety include: Anti-anxiety drugs. Antidepressants. Medicines are often used as a primary treatment for anxiety disorder. Medicines will be prescribed by a health care provider. When used together, medicines, psychotherapy, and tension reduction techniques may be the most effectivetreatment. Relationships Relationships can play a big part in helping you recover. Try to  spend more time connecting with trusted friends and family members. Consider going to couples counseling, taking family education classes, or going to familytherapy. Therapy can help you and others better understand your condition. How to recognize changes in your anxiety Everyone responds differently to treatment for anxiety. Recovery from anxiety happens when symptoms decrease and stop interfering with your daily activities at home or work. This may mean that you will start to: Have better concentration and focus. Worry will interfere less in your daily thinking. Sleep better. Be less irritable. Have more energy. Have improved memory. It is important to recognize when your condition is getting worse. Contact your health care provider if your symptoms interfere with home or work and you feellike your condition is not improving. Follow these instructions at home: Activity Exercise. Most adults should do the following: Exercise for at least 150 minutes each week. The exercise should increase your heart rate and make you sweat (moderate-intensity exercise). Strengthening exercises at least twice a week. Get the right amount and quality of sleep. Most adults need 7-9 hours of sleep each night. Lifestyle  Eat a healthy diet that includes plenty of vegetables, fruits, whole grains, low-fat dairy products, and lean protein. Do not eat a lot of foods that are high in solid fats, added sugars, or salt. Make choices that simplify your life. Do not use any products that contain nicotine or tobacco, such as cigarettes, e-cigarettes, and chewing tobacco. If you need help quitting, ask your health care provider. Avoid caffeine, alcohol, and certain over-the-counter cold medicines. These may make you feel worse. Ask your pharmacist which medicines to avoid.  General instructions Take over-the-counter and prescription medicines only as told by your health care provider. Keep all follow-up visits as told by  your health care provider. This is important. Where to find support You can get help and support from these sources: Self-help groups. Online and [employer]. A trusted spiritual leader. Couples counseling. Family education classes. Family therapy. Where to find more information You may find that joining a support group helps you deal with your anxiety. The following sources can help you locate counselors or support groups near you: [place]: www.mentalhealthamerica.net Anxiety and Depression Association of [place] (ADAA): https://www.clark.net/ National Alliance on Mental Illness (NAMI): www.nami.org Contact a health care provider if you: Have a hard time staying focused or finishing daily tasks. Spend many hours a day feeling worried about everyday life. Become exhausted by worry. Start to have headaches, feel tense, or have nausea. Urinate more than normal. Have diarrhea. Get help right away if you have: A racing heart and shortness of breath. Thoughts of hurting yourself or others. If you ever feel like you may hurt yourself or others, or have thoughts about taking your own life, get help right away. You can go to your nearest emergency department or call: Your local emergency services (911 in the [place]  A suicide crisis helpline, such as the [place] at 508-553-9174. This is open 24 hours a day. Summary Taking steps to learn and use tension reduction techniques can help calm you and help prevent triggering an anxiety reaction. When used together, medicines, psychotherapy, and tension reduction techniques may be the most effective treatment. Family, friends, and partners can play a big part in helping you recover from an anxiety disorder. This information is not intended to replace advice given to you by your health care provider. Make sure you discuss any questions you have with your healthcare provider. Document Revised: 12/25/2018  Document Reviewed: 12/25/2018 Elsevier Patient Education  [place].

## 2021-03-31 ENCOUNTER — Ambulatory Visit
Admission: RE | Admit: 2021-03-31 | Discharge: 2021-03-31 | Disposition: A | Discharge status: Home / Self Care | Payer: 59 | Source: Ambulatory Visit | Attending: Family Medicine | Admitting: Family Medicine

## 2021-03-31 ENCOUNTER — Other Ambulatory Visit: Payer: Self-pay

## 2021-03-31 DIAGNOSIS — Z1231 Encounter for screening mammogram for malignant neoplasm of breast: Secondary | ICD-10-CM

## 2021-04-22 ENCOUNTER — Ambulatory Visit (INDEPENDENT_AMBULATORY_CARE_PROVIDER_SITE_OTHER): Payer: 59 | Admitting: Family Medicine

## 2021-04-22 ENCOUNTER — Other Ambulatory Visit: Payer: Self-pay

## 2021-04-22 ENCOUNTER — Encounter: Payer: Self-pay | Admitting: Family Medicine

## 2021-04-22 VITALS — BP 142/86 | HR 74 | Temp 98.2°F | Resp 16 | Ht 64.0 in | Wt 168.4 lb

## 2021-04-22 DIAGNOSIS — R252 Cramp and spasm: Secondary | ICD-10-CM | POA: Diagnosis not present

## 2021-04-22 DIAGNOSIS — Z9989 Dependence on other enabling machines and devices: Secondary | ICD-10-CM

## 2021-04-22 DIAGNOSIS — F418 Other specified anxiety disorders: Secondary | ICD-10-CM

## 2021-04-22 DIAGNOSIS — R5383 Other fatigue: Secondary | ICD-10-CM | POA: Diagnosis not present

## 2021-04-22 DIAGNOSIS — R400 Somnolence: Secondary | ICD-10-CM

## 2021-04-22 DIAGNOSIS — R7303 Prediabetes: Secondary | ICD-10-CM

## 2021-04-22 DIAGNOSIS — G4733 Obstructive sleep apnea (adult) (pediatric): Secondary | ICD-10-CM

## 2021-04-22 DIAGNOSIS — E785 Hyperlipidemia, unspecified: Secondary | ICD-10-CM | POA: Diagnosis not present

## 2021-04-22 MED ORDER — ESCITALOPRAM OXALATE 20 MG PO TABS: 20.0000 mg | ORAL_TABLET | Freq: Every day | ORAL | 1 refills | Status: DC

## 2021-04-22 MED ORDER — ATORVASTATIN CALCIUM 10 MG PO TABS: 10.0000 mg | ORAL_TABLET | Freq: Every day | ORAL | 1 refills | Status: DC

## 2021-04-22 MED ORDER — METFORMIN HCL 500 MG PO TABS: 500.0000 mg | ORAL_TABLET | Freq: Every day | ORAL | 1 refills | Status: DC

## 2021-04-22 NOTE — Patient Instructions (Addendum)
Call Dr. Rexene Alberts about the continued sleepiness as they may want to see you or do other testing: Star Age, MD Address: [address], [place], [place] [address] Phone: [phone]  Continue lexapro same dose. No other med changes for now.  Counseling/therapy may still be helpful. Some are meeting remotely/virtual visits.  Here are a few options for counseling:  [facility]:  [place] [phone]  I can also refer you to therapist with . Let me know.   Warm up before exercise, and make sure to stretch after exercise every time. I will check labs, but follow up if cramps continue or fatigue not thought to be due to sleep apnea. Return to the clinic or go to the nearest emergency room if any of your symptoms worsen or new symptoms occur.

## 2021-04-22 NOTE — Progress Notes (Signed)
Subjective:  Patient ID: Vicki [name], female    DOB: 06-Dec-1960  Age: 60 y.o. MRN: [id number]  CC:  Chief Complaint  Patient presents with   Prediabetes    Pt here for 3 month recheck no concerns or questions at this time. Labs    Anxiety    Pt in need of refill lexapro and hydroxizine in the future   Hyperlipidemia    Labs and refill today     HPI Vicki [name] presents for   Depression with anxiety Discussed in July, recommended meeting with therapist at that time given family stressors with her grandchild in foster care in [place] and court hearings.  Health stressors as she was under treatment for bladder cancer with Dr. Alinda Money.  Decided against change in medications at that time, continued on Lexapro 20 mg daily, hydroxyzine as needed.  Fatigue was discussed last visit which was thought to be multifactorial.  She was using her CPAP for OSA and recommended discussing the settings with her sleep specialist. Has not met with therapist, no time. Sister in hospital. feels like handling things better.  Still tired - napping daily.  Has not met with sleep specialist.  Cramps a times, taking mobic daily. Helps some with pains. Going to gym 3 days per week.  No dark stool or blood in stool   Not taking hydroxyzine.  Taking lexapro daily. Would like to remain same dose.  Symbicort BID for COPD - stable. No new dyspnea/chest pain.   Depression screen [facility] 2/9 04/22/2021 02/15/2021 10/14/2020 06/01/2020 04/30/2020  Decreased Interest '1 1 1 3 ' 0  Down, Depressed, Hopeless 0 0 1 1 0  PHQ - 2 Score '1 1 2 4 ' 0  Altered sleeping '3 2 3 1 ' -  Tired, decreased energy 0 '3 3 2 ' -  Change in appetite 0 '1 2 2 ' -  Feeling bad or failure about yourself  0 0 0 1 -  Trouble concentrating 0 0 0 1 -  Moving slowly or fidgety/restless 0 '2 3 2 ' -  Suicidal thoughts 0 0 0 0 -  PHQ-9 Score '4 9 13 13 ' -  Difficult doing work/chores - - - Not difficult at all -  Some recent data might be hidden    GAD 7 : Generalized Anxiety Score 02/15/2021 10/14/2020 03/13/2020 12/13/2019  Nervous, Anxious, on Edge '1 1 1 ' 0  Control/stop worrying 0 1 1 0  Worry too much - different things 1 0 2 0  Trouble relaxing 0 0 0 0  Restless 0 3 0 0  Easily annoyed or irritable 0 1 1 0  Afraid - awful might happen '3 1 1 ' 0  Total GAD 7 Score '5 7 6 ' 0  Anxiety Difficulty - - Not difficult at all Not difficult at all      Prediabetes: Prediabetes by history, treated with metformin 500 mg daily. No new side effects.  Blood sugar 100 yesterday.  Wt Readings from Last 3 Encounters:  04/22/21 168 lb 6.4 oz (76.4 kg)  02/15/21 163 lb 6.4 oz (74.1 kg)  12/21/20 160 lb (72.6 kg)   Lab Results  Component Value Date   HGBA1C 6.3 (H) 12/21/2020   HGBA1C 6.1 (H) 10/15/2020   HGBA1C 6.4 (H) 06/01/2020   Lab Results  Component Value Date   LDLCALC 108 (H) 10/15/2020   CREATININE 0.59 01/21/2021   Hypertension: Lisinopril 20 mg daily. No missed doses. No new side effects.  Home readings: 120/80 usually.  Using CPAP nightly. Has not followed up with sleep specialist since last visit. Sleeping through night, but not feeling rested - sleepy all day.   BP Readings from Last 3 Encounters:  04/22/21 (!) 142/86  02/15/21 138/78  01/21/21 (!) 171/84   Lab Results  Component Value Date   CREATININE 0.59 01/21/2021   Hyperlipidemia: Lipitor 10 mg daily.no new side effects/myalgias.  Lab Results  Component Value Date   CHOL 180 10/15/2020   HDL 47 10/15/2020   LDLCALC 108 (H) 10/15/2020   LDLDIRECT 114 (H) 06/11/2010   TRIG 143 10/15/2020   CHOLHDL 3.8 10/15/2020   Lab Results  Component Value Date   ALT 16 10/15/2020   AST 14 10/15/2020   ALKPHOS 82 10/15/2020   BILITOT 0.3 10/15/2020     History Patient Active Problem List   Diagnosis Date Noted   Cancer of trigone of urinary bladder ([place]) 01/15/2021   Influenza A 09/26/2018   COPD with acute exacerbation ([place]) 09/26/2018   Neck pain  02/05/2016   Leg swelling 01/19/2016   Dry cough 02/11/2015   Fatigue 02/11/2015   Prediabetes 10/29/2014   Polyuria 10/28/2014   Hyperglycemia 10/28/2014   Left breast mass 04/11/2014   Right hip pain 03/21/2014   Edema, lower extremity 03/21/2014   Low back pain 12/17/2013   Right knee pain 12/17/2013   Chest pain, central 11/05/2013   Lateral epicondylitis of left elbow 11/05/2013   Obstructive chronic bronchitis without exacerbation ([place]) 08/23/2013   Wrist fracture, right 07/08/2013   Sciatica 05/28/2013   Grief reaction 04/02/2013   Myalgia and myositis, unspecified 03/07/2012   Rotator cuff syndrome of left shoulder 03/07/2012   Depression with anxiety 03/07/2012   Foot deformity, acquired 06/21/2011   Callous ulcer ([place]) 06/21/2011   Acquired hallux valgus of left foot 05/10/2011   Sleep disorder 02/04/2011   Rotator cuff tear, right 09/03/2010   SHOULDER PAIN, RIGHT 08/20/2010   TOBACCO ABUSE 06/11/2010   DEPRESSIVE DISORDER NOT ELSEWHERE CLASSIFIED 06/11/2010   Past Medical History:  Diagnosis Date   Acquired hallux valgus of left foot    Bladder tumor    Callous ulcer ([place])    Depression with anxiety    DEPRESSIVE DISORDER NOT ELSEWHERE CLASSIFIED    Diabetes mellitus without complication ([place])    Phreesia 10/13/2020   Dyspnea    Foot deformity, acquired    Grief reaction    History of kidney stones    Hyperlipidemia    Phreesia 10/13/2020   Hypertension    Phreesia 10/13/2020   Myalgia and myositis, unspecified    Onychomycosis    Oral thrush    Pneumonia    4 months ago   Pre-diabetes    Rotator cuff syndrome of left shoulder    Rotator cuff tear, right    Shortness of breath    SHOULDER PAIN, RIGHT    Sleep apnea    Phreesia 10/13/2020   Sleep disorder    Urinary hesitancy    Past Surgical History:  Procedure Laterality Date   cryotherphy  1986   TRANSURETHRAL RESECTION OF BLADDER TUMOR N/A 12/21/2020   Procedure: TRANSURETHRAL RESECTION  OF BLADDER TUMOR (TURBT)WITH CYSTOSTOPY/ BILATERAL RETROGRADE PYELOGRAM WITH LEFT STENT PLACEMENT;  Surgeon: Raynelle Bring, MD;  Location: [facility];  Service: Urology;  Laterality: N/A;  GENERAL ANESTHESIA WITHH PARALYSIS   TUBAL LIGATION     Allergies  Allergen Reactions   Codeine Nausea And Vomiting   Prior to Admission medications   Medication  Sig Start Date End Date Taking? Authorizing Provider  ASPIRIN 81 PO Take 81 mg by mouth daily.   Yes [provider]  atorvastatin (LIPITOR) 10 MG tablet Take 1 tablet (10 mg total) by mouth daily. 02/15/21  Yes Wendie Agreste, MD  budesonide-formoterol [facility]) 160-4.5 MCG/ACT inhaler Inhale 2 puffs into the lungs 2 (two) times daily.   Yes [provider]  Calcium Carb-Cholecalciferol (CALCIUM 600/VITAMIN D3 PO) Take 1 tablet by mouth daily.   Yes [provider]  escitalopram (LEXAPRO) 20 MG tablet Take 1 tablet (20 mg total) by mouth at bedtime. 02/15/21  Yes Wendie Agreste, MD  hydrOXYzine (ATARAX/VISTARIL) 25 MG tablet Take 0.5-1 tablets (12.5-25 mg total) by mouth every 8 (eight) hours as needed for anxiety. 12/28/20  Yes Wendie Agreste, MD  lisinopril (ZESTRIL) 20 MG tablet Take 20 mg by mouth daily. 11/03/20  Yes [provider]  meloxicam (MOBIC) 7.5 MG tablet Take 1 tablet (7.5 mg total) by mouth at bedtime as needed for pain. 02/15/21  Yes Wendie Agreste, MD  metFORMIN (GLUCOPHAGE) 500 MG tablet Take 1 tablet (500 mg total) by mouth daily with breakfast. 02/15/21  Yes Wendie Agreste, MD  phenazopyridine (PYRIDIUM) 200 MG tablet Take 1 tablet (200 mg total) by mouth 3 (three) times daily as needed for pain. 12/21/20  Yes Raynelle Bring, MD  vitamin E 180 MG (400 UNITS) capsule Take 400 Units by mouth daily.   Yes [provider]   Social History   Socioeconomic History   Marital status: Widowed    Spouse name: Not on file   Number of children: Not on file   Years of education: Not  on file   Highest education level: Not on file  Occupational History   Not on file  Tobacco Use   Smoking status: Every Day    Packs/day: 0.50    Types: Cigarettes   Smokeless tobacco: Never   Tobacco comments:    increased smoking again due to stress  Vaping Use   Vaping Use: Never used  Substance and Sexual Activity   Alcohol use: No   Drug use: No   Sexual activity: Never    Birth control/protection: Surgical, Post-menopausal  Other Topics Concern   Not on file  Social History Narrative   Not on file   Social Determinants of Health   Financial Resource Strain: Not on file  Food Insecurity: Not on file  Transportation Needs: Not on file  Physical Activity: Not on file  Stress: Not on file  Social Connections: Not on file  Intimate Partner Violence: Not on file    Review of Systems   Objective:   Vitals:   04/22/21 1448  BP: (!) 142/86  Pulse: 74  Resp: 16  Temp: 98.2 F (36.8 C)  TempSrc: Temporal  SpO2: 94%  Weight: 168 lb 6.4 oz (76.4 kg)  Height: '5\' 4"'  (1.626 m)     Physical Exam Vitals reviewed.  Constitutional:      General: She is not in acute distress.    Appearance: Normal appearance. She is well-developed. She is not ill-appearing or diaphoretic.  HENT:     Head: Normocephalic and atraumatic.  Eyes:     Conjunctiva/sclera: Conjunctivae normal.     Pupils: Pupils are equal, round, and reactive to light.  Neck:     Vascular: No carotid bruit.  Cardiovascular:     Rate and Rhythm: Normal rate and regular rhythm.  Heart sounds: Normal heart sounds.  Pulmonary:     Effort: Pulmonary effort is normal.     Breath sounds: Normal breath sounds.  Abdominal:     Palpations: Abdomen is soft. There is no pulsatile mass.     Tenderness: There is no abdominal tenderness.  Musculoskeletal:     Right lower leg: No edema.     Left lower leg: No edema.  Skin:    General: Skin is warm and dry.  Neurological:     Mental Status: She is alert and  oriented to person, place, and time.  Psychiatric:        Mood and Affect: Mood normal.        Behavior: Behavior normal.       Assessment & Plan:  Vicki [name] is a 60 y.o. female . Fatigue, unspecified type - Plan: Comprehensive metabolic panel, CBC, TSH OSA on CPAP Daytime sleepiness  - fatigue may still be multifactorial but with persistent daytime sleepiness, recommended she initially follow-up with sleep specialist as adjustment of CPAP or testing may be needed.  Check CBC, TSH, CMP.  Depression with anxiety - Plan: escitalopram (LEXAPRO) 20 MG tablet  -Continue Lexapro same dose, recommended counseling as virtual visit may also be option if less time prohibitive.  Hyperlipidemia, unspecified hyperlipidemia type - Plan: atorvastatin (LIPITOR) 10 MG tablet, Comprehensive metabolic panel, Lipid panel  -Continue Lipitor same dose for now, check electrolytes for muscle cramps, consider statin holiday but less likely cause.  Prediabetes - Plan: metFORMIN (GLUCOPHAGE) 500 MG tablet, Comprehensive metabolic panel, Hemoglobin A1c  -Check labs, continue metformin same dose for now  Muscle cramps - Plan: Comprehensive metabolic panel  -Adequate hydration, stretching after exercise discussed, consider statin holiday if persistent, check labs above.  Meds ordered this encounter  Medications   escitalopram (LEXAPRO) 20 MG tablet    Sig: Take 1 tablet (20 mg total) by mouth at bedtime.    Dispense:  90 tablet    Refill:  1   atorvastatin (LIPITOR) 10 MG tablet    Sig: Take 1 tablet (10 mg total) by mouth daily.    Dispense:  90 tablet    Refill:  1   metFORMIN (GLUCOPHAGE) 500 MG tablet    Sig: Take 1 tablet (500 mg total) by mouth daily with breakfast.    Dispense:  90 tablet    Refill:  1   Patient Instructions  Call Dr. Rexene Alberts about the continued sleepiness as they may want to see you or do other testing: Star Age, MD Address: [address], [place], [place]  [address] Phone: [phone]  Continue lexapro same dose. No other med changes for now.  Counseling/therapy may still be helpful. Some are meeting remotely/virtual visits.  Here are a few options for counseling:  [facility]:  [place] [phone]  I can also refer you to therapist with [facility]. Let me know.   Warm up before exercise, and make sure to stretch after exercise every time. I will check labs, but follow up if cramps continue or fatigue not thought to be due to sleep apnea. Return to the clinic or go to the nearest emergency room if any of your symptoms worsen or new symptoms occur.    Signed,   Merri Ray, MD [place] 04/22/21 3:37 PM

## 2021-04-23 LAB — LIPID PANEL
Cholesterol: 184 mg/dL (ref 0–200)
HDL: 55.9 mg/dL
LDL Cholesterol: 102 mg/dL — ABNORMAL HIGH (ref 0–99)
NonHDL: 128.02
Total CHOL/HDL Ratio: 3
Triglycerides: 129 mg/dL (ref 0.0–149.0)
VLDL: 25.8 mg/dL (ref 0.0–40.0)

## 2021-04-23 LAB — CBC
HCT: 40.1 % (ref 36.0–46.0)
Hemoglobin: 13.3 g/dL (ref 12.0–15.0)
MCHC: 33.2 g/dL (ref 30.0–36.0)
MCV: 88.5 fl (ref 78.0–100.0)
Platelets: 340 10*3/uL (ref 150.0–400.0)
RBC: 4.53 Mil/uL (ref 3.87–5.11)
RDW: 13.5 % (ref 11.5–15.5)
WBC: 7.3 10*3/uL (ref 4.0–10.5)

## 2021-04-23 LAB — COMPREHENSIVE METABOLIC PANEL WITH GFR
ALT: 14 U/L (ref 0–35)
AST: 13 U/L (ref 0–37)
Albumin: 4.2 g/dL (ref 3.5–5.2)
Alkaline Phosphatase: 72 U/L (ref 39–117)
BUN: 12 mg/dL (ref 6–23)
CO2: 30 meq/L (ref 19–32)
Calcium: 9.3 mg/dL (ref 8.4–10.5)
Chloride: 100 meq/L (ref 96–112)
Creatinine, Ser: 0.74 mg/dL (ref 0.40–1.20)
GFR: 87.33 mL/min
Glucose, Bld: 127 mg/dL — ABNORMAL HIGH (ref 70–99)
Potassium: 4.1 meq/L (ref 3.5–5.1)
Sodium: 138 meq/L (ref 135–145)
Total Bilirubin: 0.4 mg/dL (ref 0.2–1.2)
Total Protein: 6.9 g/dL (ref 6.0–8.3)

## 2021-04-23 LAB — TSH: TSH: 1.45 u[IU]/mL (ref 0.35–5.50)

## 2021-04-23 LAB — HEMOGLOBIN A1C: Hgb A1c MFr Bld: 6.4 % (ref 4.6–6.5)

## 2021-04-29 ENCOUNTER — Other Ambulatory Visit: Payer: Self-pay | Admitting: Family Medicine

## 2021-04-29 DIAGNOSIS — G8929 Other chronic pain: Secondary | ICD-10-CM

## 2021-05-04 NOTE — Telephone Encounter (Signed)
Please advise 

## 2021-05-29 ENCOUNTER — Other Ambulatory Visit: Payer: Self-pay | Admitting: Family Medicine

## 2021-06-01 NOTE — Progress Notes (Deleted)
PATIENT: Vicki [name] DOB: [birth year]  REASON FOR VISIT: follow up HISTORY FROM: patient PRIMARY NEUROLOGIST:   HISTORY OF PRESENT ILLNESS: Today 06/01/21  HISTORY (copied from Dr. Guadelupe Sabin note) 11/25/20: She reports using the machine every night.  She has no trouble tolerating it but did not bring the machine and we are not wirelessly connected/tagged yet.  She would be willing to bring in the machine if need be.  She reports feeling just a little bit better first thing in the morning when it comes to her sleepiness.  Other than that she has not had any telltale results from treating her sleep apnea with her AutoPap machine.  She is motivated to continue with treatment.    The patient's allergies, current medications, family history, past medical history, past social history, past surgical history and problem list were reviewed and updated as appropriate.     REVIEW OF SYSTEMS: Out of a complete 14 system review of symptoms, the patient complains only of the following symptoms, and all other reviewed systems are negative.  ALLERGIES: Allergies  Allergen Reactions   Codeine Nausea And Vomiting    HOME MEDICATIONS: Outpatient Medications Prior to Visit  Medication Sig Dispense Refill   ASPIRIN 81 PO Take 81 mg by mouth daily.     atorvastatin (LIPITOR) 10 MG tablet Take 1 tablet (10 mg total) by mouth daily. 90 tablet 1   budesonide-formoterol (SYMBICORT) 160-4.5 MCG/ACT inhaler Inhale 2 puffs into the lungs 2 (two) times daily.     Calcium Carb-Cholecalciferol (CALCIUM 600/VITAMIN D3 PO) Take 1 tablet by mouth daily.     escitalopram (LEXAPRO) 20 MG tablet Take 1 tablet (20 mg total) by mouth at bedtime. 90 tablet 1   hydrOXYzine (ATARAX/VISTARIL) 25 MG tablet TAKE 1/2 TO 1 TABLETS BY MOUTH EVERY 8 (EIGHT) HOURS AS NEEDED FOR ANXIETY. 30 tablet 3   lisinopril (ZESTRIL) 20 MG tablet Take 20 mg by mouth daily.     meloxicam (MOBIC) 7.5 MG tablet TAKE 1 TABLET (7.5 MG TOTAL)  BY MOUTH AT BEDTIME AS NEEDED FOR PAIN. 30 tablet 2   metFORMIN (GLUCOPHAGE) 500 MG tablet Take 1 tablet (500 mg total) by mouth daily with breakfast. 90 tablet 1   phenazopyridine (PYRIDIUM) 200 MG tablet Take 1 tablet (200 mg total) by mouth 3 (three) times daily as needed for pain. 20 tablet 0   vitamin E 180 MG (400 UNITS) capsule Take 400 Units by mouth daily.     Facility-Administered Medications Prior to Visit  Medication Dose Route Frequency Provider Last Rate Last Admin   gemcitabine (GEMZAR) chemo syringe for bladder instillation 2,000 mg  2,000 mg Bladder Instillation Once Raynelle Bring, MD        PAST MEDICAL HISTORY: Past Medical History:  Diagnosis Date   Acquired hallux valgus of left foot    Bladder tumor    Callous ulcer ([place])    Depression with anxiety    DEPRESSIVE DISORDER NOT ELSEWHERE CLASSIFIED    Diabetes mellitus without complication ([place])    Phreesia 10/13/2020   Dyspnea    Foot deformity, acquired    Grief reaction    History of kidney stones    Hyperlipidemia    Phreesia 10/13/2020   Hypertension    Phreesia 10/13/2020   Myalgia and myositis, unspecified    Onychomycosis    Oral thrush    Pneumonia    4 months ago   Pre-diabetes    Rotator cuff syndrome of left shoulder  Rotator cuff tear, right    Shortness of breath    SHOULDER PAIN, RIGHT    Sleep apnea    Phreesia 10/13/2020   Sleep disorder    Urinary hesitancy     PAST SURGICAL HISTORY: Past Surgical History:  Procedure Laterality Date   cryotherphy  1986   TRANSURETHRAL RESECTION OF BLADDER TUMOR N/A 12/21/2020   Procedure: TRANSURETHRAL RESECTION OF BLADDER TUMOR (TURBT)WITH CYSTOSTOPY/ BILATERAL RETROGRADE PYELOGRAM WITH LEFT STENT PLACEMENT;  Surgeon: Raynelle Bring, MD;  Location: [facility];  Service: Urology;  Laterality: N/A;  GENERAL ANESTHESIA WITHH PARALYSIS   TUBAL LIGATION      FAMILY HISTORY: Family History  Problem Relation Age of Onset   Heart disease Mother     Diabetes Mother    Cancer Mother        breast and ovarian   Stroke Mother    Breast cancer Mother        diagnosed in her 35's   Heart disease Father     SOCIAL HISTORY: Social History   Socioeconomic History   Marital status: Widowed    Spouse name: Not on file   Number of children: Not on file   Years of education: Not on file   Highest education level: Not on file  Occupational History   Not on file  Tobacco Use   Smoking status: Every Day    Packs/day: 0.50    Types: Cigarettes   Smokeless tobacco: Never   Tobacco comments:    increased smoking again due to stress  Vaping Use   Vaping Use: Never used  Substance and Sexual Activity   Alcohol use: No   Drug use: No   Sexual activity: Never    Birth control/protection: Surgical, Post-menopausal  Other Topics Concern   Not on file  Social History Narrative   Not on file   Social Determinants of Health   Financial Resource Strain: Not on file  Food Insecurity: Not on file  Transportation Needs: Not on file  Physical Activity: Not on file  Stress: Not on file  Social Connections: Not on file  Intimate Partner Violence: Not on file      PHYSICAL EXAM  There were no vitals filed for this visit. There is no height or weight on file to calculate BMI.  Generalized: Well developed, in no acute distress   Neurological examination  Mentation: Alert oriented to time, place, history taking. Follows all commands speech and language fluent Cranial nerve II-XII: Pupils were equal round reactive to light. Extraocular movements were full, visual field were full on confrontational test. Facial sensation and strength were normal. Uvula tongue midline. Head turning and shoulder shrug  were normal and symmetric. Motor: The motor testing reveals 5 over 5 strength of all 4 extremities. Good symmetric motor tone is noted throughout.  Sensory: Sensory testing is intact to soft touch on all 4 extremities. No evidence of  extinction is noted.  Coordination: Cerebellar testing reveals good finger-nose-finger and heel-to-shin bilaterally.  Gait and station: Gait is normal. Tandem gait is normal. Romberg is negative. No drift is seen.  Reflexes: Deep tendon reflexes are symmetric and normal bilaterally.   DIAGNOSTIC DATA (LABS, IMAGING, TESTING) - I reviewed patient records, labs, notes, testing and imaging myself where available.  Lab Results  Component Value Date   WBC 7.3 04/22/2021   HGB 13.3 04/22/2021   HCT 40.1 04/22/2021   MCV 88.5 04/22/2021   PLT 340.0 04/22/2021      Component  Value Date/Time   NA 138 04/22/2021 1555   NA 140 10/15/2020 1125   K 4.1 04/22/2021 1555   CL 100 04/22/2021 1555   CO2 30 04/22/2021 1555   GLUCOSE 127 (H) 04/22/2021 1555   BUN 12 04/22/2021 1555   BUN 15 10/15/2020 1125   CREATININE 0.74 04/22/2021 1555   CREATININE 0.53 01/11/2016 1224   CALCIUM 9.3 04/22/2021 1555   PROT 6.9 04/22/2021 1555   PROT 6.8 10/15/2020 1125   ALBUMIN 4.2 04/22/2021 1555   ALBUMIN 4.3 10/15/2020 1125   AST 13 04/22/2021 1555   ALT 14 04/22/2021 1555   ALKPHOS 72 04/22/2021 1555   BILITOT 0.4 04/22/2021 1555   BILITOT 0.3 10/15/2020 1125   GFRNONAA >60 01/21/2021 1321   GFRNONAA >89 01/11/2016 1224   GFRAA 115 12/13/2019 1220   GFRAA >89 01/11/2016 1224   Lab Results  Component Value Date   CHOL 184 04/22/2021   HDL 55.90 04/22/2021   LDLCALC 102 (H) 04/22/2021   LDLDIRECT 114 (H) 06/11/2010   TRIG 129.0 04/22/2021   CHOLHDL 3 04/22/2021   Lab Results  Component Value Date   HGBA1C 6.4 04/22/2021   No results found for: VITAMINB12 Lab Results  Component Value Date   TSH 1.45 04/22/2021      ASSESSMENT AND PLAN 60 y.o. year old female  has a past medical history of Acquired hallux valgus of left foot, Bladder tumor, Callous ulcer ([place]), Depression with anxiety, DEPRESSIVE DISORDER NOT ELSEWHERE CLASSIFIED, Diabetes mellitus without complication ([place]), Dyspnea,  Foot deformity, acquired, Grief reaction, History of kidney stones, Hyperlipidemia, Hypertension, Myalgia and myositis, unspecified, Onychomycosis, Oral thrush, Pneumonia, Pre-diabetes, Rotator cuff syndrome of left shoulder, Rotator cuff tear, right, Shortness of breath, SHOULDER PAIN, RIGHT, Sleep apnea, Sleep disorder, and Urinary hesitancy. here with ***     Ward Givens, MSN, NP-C 06/01/2021, 4:58 PM [facility] [address] [place], [place] [address] [phone]

## 2021-06-02 ENCOUNTER — Ambulatory Visit: Payer: 59 | Admitting: Adult Health

## 2021-06-02 ENCOUNTER — Telehealth: Payer: Self-pay | Admitting: Adult Health

## 2021-06-02 NOTE — Telephone Encounter (Signed)
LVM and sent mychart message for patient to call us back to reschedule appointment because Jinny Blossom is out sick. Can see Megan, Amy or Dr. Rexene Alberts. Okay to use reschedule/held slots.

## 2021-07-22 ENCOUNTER — Ambulatory Visit (INDEPENDENT_AMBULATORY_CARE_PROVIDER_SITE_OTHER): Payer: 59 | Admitting: Family Medicine

## 2021-07-22 ENCOUNTER — Other Ambulatory Visit: Payer: Self-pay

## 2021-07-22 ENCOUNTER — Encounter: Payer: Self-pay | Admitting: Family Medicine

## 2021-07-22 VITALS — BP 163/99 | HR 90 | Temp 98.2°F | Resp 18 | Ht 64.0 in | Wt 168.2 lb

## 2021-07-22 DIAGNOSIS — E785 Hyperlipidemia, unspecified: Secondary | ICD-10-CM | POA: Diagnosis not present

## 2021-07-22 DIAGNOSIS — R400 Somnolence: Secondary | ICD-10-CM | POA: Diagnosis not present

## 2021-07-22 DIAGNOSIS — R5383 Other fatigue: Secondary | ICD-10-CM | POA: Diagnosis not present

## 2021-07-22 DIAGNOSIS — F418 Other specified anxiety disorders: Secondary | ICD-10-CM

## 2021-07-22 DIAGNOSIS — R252 Cramp and spasm: Secondary | ICD-10-CM

## 2021-07-22 DIAGNOSIS — F439 Reaction to severe stress, unspecified: Secondary | ICD-10-CM

## 2021-07-22 MED ORDER — HYDROXYZINE HCL 10 MG PO TABS: 5.0000 mg | ORAL_TABLET | Freq: Three times a day (TID) | ORAL | 0 refills | Status: DC | PRN

## 2021-07-22 NOTE — Patient Instructions (Addendum)
See information on stress/anxiety.  I would recommend meeting with a therapist - let me know if I can provide numbers.  I am also happy to refer you to psychiatrist if meds are not helping.  Can try lower dose hydroxyzine up to 3 times per day for anxiety or sleep if needed.  Low intensity exercise is great to help manage anxiety. Stretch before bedtime as well and make sure to drink plenty of water.  Stop atorvastatin for 2 weeks. If the cramps go away, we can try intermittent dosing or different med - let me know. If no change in cramps, restart atorvastatin and we can look at other causes.   Take care and let me know if I can help further.    Managing Anxiety, Adult After being diagnosed with anxiety, you may be relieved to know why you have felt or behaved a certain way. You may also feel overwhelmed about the treatment ahead and what it will mean for your life. With care and support, you can manage this condition. How to manage lifestyle changes Managing stress and anxiety Stress is your body's reaction to life changes and events, both good and bad. Most stress will last just a few hours, but stress can be ongoing and can lead to more than just stress. Although stress can play a major role in anxiety, it is not the same as anxiety. Stress is usually caused by something external, such as a deadline, test, or competition. Stress normally passes after the triggering event has ended.  Anxiety is caused by something internal, such as imagining a terrible outcome or worrying that something will go wrong that will devastate you. Anxiety often does not go away even after the triggering event is over, and it can become long-term (chronic) worry. It is important to understand the differences between stress and anxiety and to manage your stress effectively so that it does not lead to an anxious response. Talk with your health care provider or a counselor to learn more about reducing anxiety and stress. He  or she may suggest tension reduction techniques, such as: Music therapy. Spend time creating or listening to music that you enjoy and that inspires you. Mindfulness-based meditation. Practice being aware of your normal breaths while not trying to control your breathing. It can be done while sitting or walking. Centering prayer. This involves focusing on a word, phrase, or sacred image that means something to you and brings you peace. Deep breathing. To do this, expand your stomach and inhale slowly through your nose. Hold your breath for 3-5 seconds. Then exhale slowly, letting your stomach muscles relax. Self-talk. Learn to notice and identify thought patterns that lead to anxiety reactions and change those patterns to thoughts that feel peaceful. Muscle relaxation. Taking time to tense muscles and then relax them. Choose a tension reduction technique that fits your lifestyle and personality. These techniques take time and practice. Set aside 5-15 minutes a day to do them. Therapists can offer counseling and training in these techniques. The training to help with anxiety may be covered by some insurance plans. Other things you can do to manage stress and anxiety include: Keeping a stress diary. This can help you learn what triggers your reaction and then learn ways to manage your response. Thinking about how you react to certain situations. You may not be able to control everything, but you can control your response. Making time for activities that help you relax and not feeling guilty about spending your  time in this way. Doing visual imagery. This involves imagining or creating mental pictures to help you relax. Practicing yoga. Through yoga poses, you can lower tension and promote relaxation.  Medicines Medicines can help ease symptoms. Medicines for anxiety include: Antidepressant medicines. These are usually prescribed for long-term daily control. Anti-anxiety medicines. These may be added in  severe cases, especially when panic attacks occur. Medicines will be prescribed by a health care provider. When used together, medicines, psychotherapy, and tension reduction techniques may be the most effective treatment. Relationships Relationships can play a big part in helping you recover. Try to spend more time connecting with trusted friends and family members. Consider going to couples counseling if you have a partner, taking family education classes, or going to family therapy. Therapy can help you and others better understand your condition. How to recognize changes in your anxiety Everyone responds differently to treatment for anxiety. Recovery from anxiety happens when symptoms decrease and stop interfering with your daily activities at home or work. This may mean that you will start to: Have better concentration and focus. Worry will interfere less in your daily thinking. Sleep better. Be less irritable. Have more energy. Have improved memory. It is also important to recognize when your condition is getting worse. Contact your health care provider if your symptoms interfere with home or work and you feel like your condition is not improving. Follow these instructions at home: Activity Exercise. Adults should do the following: Exercise for at least 150 minutes each week. The exercise should increase your heart rate and make you sweat (moderate-intensity exercise). Strengthening exercises at least twice a week. Get the right amount and quality of sleep. Most adults need 7-9 hours of sleep each night. Lifestyle  Eat a healthy diet that includes plenty of vegetables, fruits, whole grains, low-fat dairy products, and lean protein. Do not eat a lot of foods that are high in fats, added sugars, or salt (sodium). Make choices that simplify your life. Do not use any products that contain nicotine or tobacco. These products include cigarettes, chewing tobacco, and vaping devices, such as  e-cigarettes. If you need help quitting, ask your health care provider. Avoid caffeine, alcohol, and certain over-the-counter cold medicines. These may make you feel worse. Ask your pharmacist which medicines to avoid. General instructions Take over-the-counter and prescription medicines only as told by your health care provider. Keep all follow-up visits. This is important. Where to find support You can get help and support from these sources: Self-help groups. Online and [employer]. A trusted spiritual leader. Couples counseling. Family education classes. Family therapy. Where to find more information You may find that joining a support group helps you deal with your anxiety. The following sources can help you locate counselors or support groups near you: [place]: www.mentalhealthamerica.net Anxiety and Depression Association of [place] (ADAA): https://www.clark.net/ National Alliance on Mental Illness (NAMI): www.nami.org Contact a health care provider if: You have a hard time staying focused or finishing daily tasks. You spend many hours a day feeling worried about everyday life. You become exhausted by worry. You start to have headaches or frequently feel tense. You develop chronic nausea or diarrhea. Get help right away if: You have a racing heart and shortness of breath. You have thoughts of hurting yourself or others. If you ever feel like you may hurt yourself or others, or have thoughts about taking your own life, get help right away. Go to your nearest emergency department or: Call your local  emergency services (911 in the [place] Call a suicide crisis helpline, such as the [facility] at 878-168-2212 or 988 in the [place]. This is open 24 hours a day in the [place] Text the Crisis Text Line at 403-855-3741 (in the [place].). Summary Taking steps to learn and use tension reduction techniques can help calm you and help prevent triggering an anxiety  reaction. When used together, medicines, psychotherapy, and tension reduction techniques may be the most effective treatment. Family, friends, and partners can play a big part in supporting you. This information is not intended to replace advice given to you by your health care provider. Make sure you discuss any questions you have with your health care provider. Document Revised: 02/17/2021 Document Reviewed: 11/15/2020 Elsevier Patient Education  2022 [place], Adult Feeling a certain amount of stress is normal. Stress helps our body and mind get ready to deal with the demands of life. Stress hormones can motivate you to do well at work and meet your responsibilities. But severe or long-term (chronic) stress can affect your mental and physical health. Chronic stress puts you at higher risk for: Anxiety and depression. Other health problems such as digestive problems, muscle aches, heart disease, high blood pressure, and stroke. What are the causes? Common causes of stress include: Demands from work, such as deadlines, feeling overworked, or having long hours. Pressures at home, such as money issues, disagreements with a spouse, or parenting issues. Pressures from major life changes, such as divorce, moving, loss of a loved one, or chronic illness. You may be at higher risk for stress-related problems if you: Do not get enough sleep. Are in poor health. Do not have emotional support. Have a mental health disorder such as anxiety or depression. How to recognize stress Stress can make you: Have trouble sleeping. Feel sad, anxious, irritable, or overwhelmed. Lose your appetite. Overeat or want to eat unhealthy foods. Want to use drugs or alcohol. Stress can also cause physical symptoms, such as: Sore, tense muscles, especially in the shoulders and neck. Headaches. Trouble breathing. A faster heart rate. Stomach pain, nausea, or vomiting. Diarrhea or  constipation. Trouble concentrating. Follow these instructions at home: Eating and drinking Eat a healthy diet. This includes: Eating foods that are high in fiber, such as beans, whole grains, and fresh fruits and vegetables. Limiting foods that are high in fat and processed sugars, such as fried or sweet foods. Do not skip meals or overeat. Drink enough fluid to keep your urine pale yellow. Alcohol use Do not drink alcohol if: Your health care provider tells you not to drink. You are pregnant, may be pregnant, or are planning to become pregnant. Drinking alcohol is a way some people try to ease their stress. This can be dangerous, so if you drink alcohol: Limit how much you have to: 0-1 drink a day for women. 0-2 drinks a day for men. Know how much alcohol is in your drink. In the [place] one drink equals one 12 oz bottle of beer (355 mL), one 5 oz glass of wine (148 mL), or one 1 oz glass of hard liquor (44 mL). Activity  Include 30 minutes of exercise in your daily schedule. Exercise is a good stress reducer. Include time in your day for an activity that you find relaxing. Try taking a walk, going on a bike ride, reading a book, or listening to music. Schedule your time in a way that lowers stress, and keep a regular  schedule. Focus on doing what is most important to get done. Lifestyle Identify the source of your stress and your reaction to it. See a therapist who can help you change unhelpful reactions. When there are stressful events: Talk about them with family, friends, or coworkers. Try to think realistically about stressful events and not ignore them or overreact. Try to find the positives in a stressful situation and not focus on the negatives. Cut back on responsibilities at work and home, if possible. Ask for help from friends or family members if you need it. Find ways to manage stress, such as: Mindfulness, meditation, or deep breathing. Yoga or tai chi. Progressive  muscle relaxation. Spending time in nature. Doing art, playing music, or reading. Making time for fun activities. Spending time with family and friends. Get support from family, friends, or spiritual resources. General instructions Get enough sleep. Try to go to sleep and get up at about the same time every day. Take over-the-counter and prescription medicines only as told by your health care provider. Do not use any products that contain nicotine or tobacco. These products include cigarettes, chewing tobacco, and vaping devices, such as e-cigarettes. If you need help quitting, ask your health care provider. Do not use drugs or smoke to deal with stress. Keep all follow-up visits. This is important. Where to find support Talk with your health care provider about stress management or finding a support group. Find a therapist to work with you on your stress management techniques. Where to find more information [employer] on Mental Illness: www.nami.org American Psychological Association: TVStere[employer].ch Contact a health care provider if: Your stress symptoms get worse. You are unable to manage your stress at home. You are struggling to stop using drugs or alcohol. Get help right away if: You may be a danger to yourself or others. You have any thoughts of death or suicide. Get help right awayif you feel like you may hurt yourself or others, or have thoughts about taking your own life. Go to your nearest emergency room or: Call 911. Call the [facility] at 501-570-1498 or 988 in the [place] This is open 24 hours a day. Text the Crisis Text Line at 4125061976. Summary Feeling a certain amount of stress is normal, but severe or long-term (chronic) stress can affect your mental and physical health. Chronic stress can put you at higher risk for anxiety, depression, and other health problems such as digestive problems, muscle aches, heart disease, high blood pressure,  and stroke. You may be at higher risk for stress-related problems if you do not get enough sleep, are in poor health, lack emotional support, or have a mental health disorder such as anxiety or depression. Identify the source of your stress and your reaction to it. Try talking about stressful events with family, friends, or coworkers, finding a coping method, or getting support from spiritual resources. If you need more help, talk with your health care provider about finding a support group or a mental health therapist. This information is not intended to replace advice given to you by your health care provider. Make sure you discuss any questions you have with your health care provider. Document Revised: 02/18/2021 Document Reviewed: 02/16/2021 Elsevier Patient Education  2022 [name].    If you have lab work done today you will be contacted with your lab results within the next 2 weeks.  If you have not heard from [place] then please contact us. The fastest way to get your  results is to register for My Chart.   IF you received an x-ray today, you will receive an invoice from [facility]. Please contact [facility] at [phone] with questions or concerns regarding your invoice.   IF you received labwork today, you will receive an invoice from [facility]. Please contact [facility] at [phone] with questions or concerns regarding your invoice.   Our billing staff will not be able to assist you with questions regarding bills from these companies.  You will be contacted with the lab results as soon as they are available. The fastest way to get your results is to activate your My Chart account. Instructions are located on the last page of this paperwork. If you have not heard from [place] regarding the results in 2 weeks, please contact this office.

## 2021-07-22 NOTE — Progress Notes (Signed)
Subjective:  Patient ID: Vicki [name], female    DOB: June 17, 1961  Age: 60 y.o. MRN: [id number]  CC:  Chief Complaint  Patient presents with   Follow-up    Patient states she is here for a follow up for fatigue and cramping. Pt states she is still having some cramping while sleeping.She states she is doing better with being so fatigue.    HPI Vicki [name] presents for   Depression with anxiety. Also thought to be contributing to fatigue, although likely multifactorial.  Last discussed in September.  Going to gym 3 days/week at that time. Option of therapist discussed previously given family stressors, personal health stressors, including treatment for bladder cancer. Chose to remain on same dose of Lexapro last visit, not needing hydroxyzine. Fatigue overall stable.  Stress at home - 5 people at home. Will be having to move. Smoking cigarettes, coffee to manage stress. Forgetful when stressed. Going to gym 3 days per week - feels better.  Still declines meeting with therapist. No suicidal thoughts.  Has tried hydroxyzine one night - 1/2 pill - sleepy.   Depression screen [facility] 2/9 07/22/2021 04/22/2021 02/15/2021 10/14/2020 06/01/2020  Decreased Interest '3 1 1 1 3  ' Down, Depressed, Hopeless 2 0 0 1 1  PHQ - 2 Score '5 1 1 2 4  ' Altered sleeping '1 3 2 3 1  ' Tired, decreased energy 3 0 '3 3 2  ' Change in appetite 0 0 '1 2 2  ' Feeling bad or failure about yourself  1 0 0 0 1  Trouble concentrating 0 0 0 0 1  Moving slowly or fidgety/restless 0 0 '2 3 2  ' Suicidal thoughts 0 0 0 0 0  PHQ-9 Score '10 4 9 13 13  ' Difficult doing work/chores Somewhat difficult - - - Not difficult at all  Some recent data might be hidden    Fatigue See last visit.  Thought to be multiple contributors.  However with daytime sedation - recommended follow up with sleep specialist to assess CPAP settings or new testing. Appt rescheduled to 09/14/21 as provider out sick in October.  Tsh, cbc, overall reassuring  on September.  A1c stable with prediabetes.  Mobic for chronic pains, cramps.  Still some cramps in legs at night, upper legs, knees, and lower legs at night. No stretches at home - stretch before and after exercise. Not waking up.  Not able to use CPAP - appt soon for machine issue.    History Patient Active Problem List   Diagnosis Date Noted   Cancer of trigone of urinary bladder ([place]) 01/15/2021   Influenza A 09/26/2018   COPD with acute exacerbation ([place]) 09/26/2018   Neck pain 02/05/2016   Leg swelling 01/19/2016   Dry cough 02/11/2015   Fatigue 02/11/2015   Prediabetes 10/29/2014   Polyuria 10/28/2014   Hyperglycemia 10/28/2014   Left breast mass 04/11/2014   Right hip pain 03/21/2014   Edema, lower extremity 03/21/2014   Low back pain 12/17/2013   Right knee pain 12/17/2013   Chest pain, central 11/05/2013   Lateral epicondylitis of left elbow 11/05/2013   Obstructive chronic bronchitis without exacerbation ([place]) 08/23/2013   Wrist fracture, right 07/08/2013   Sciatica 05/28/2013   Grief reaction 04/02/2013   Myalgia and myositis, unspecified 03/07/2012   Rotator cuff syndrome of left shoulder 03/07/2012   Depression with anxiety 03/07/2012   Foot deformity, acquired 06/21/2011   Callous ulcer ([place]) 06/21/2011   Acquired hallux valgus of left foot  05/10/2011   Sleep disorder 02/04/2011   Rotator cuff tear, right 09/03/2010   SHOULDER PAIN, RIGHT 08/20/2010   TOBACCO ABUSE 06/11/2010   DEPRESSIVE DISORDER NOT ELSEWHERE CLASSIFIED 06/11/2010   Past Medical History:  Diagnosis Date   Acquired hallux valgus of left foot    Bladder tumor    Callous ulcer ([place])    Depression with anxiety    DEPRESSIVE DISORDER NOT ELSEWHERE CLASSIFIED    Diabetes mellitus without complication ([place])    Phreesia 10/13/2020   Dyspnea    Foot deformity, acquired    Grief reaction    History of kidney stones    Hyperlipidemia    Phreesia 10/13/2020   Hypertension    Phreesia  10/13/2020   Myalgia and myositis, unspecified    Onychomycosis    Oral thrush    Pneumonia    4 months ago   Pre-diabetes    Rotator cuff syndrome of left shoulder    Rotator cuff tear, right    Shortness of breath    SHOULDER PAIN, RIGHT    Sleep apnea    Phreesia 10/13/2020   Sleep disorder    Urinary hesitancy    Past Surgical History:  Procedure Laterality Date   cryotherphy  1986   TRANSURETHRAL RESECTION OF BLADDER TUMOR N/A 12/21/2020   Procedure: TRANSURETHRAL RESECTION OF BLADDER TUMOR (TURBT)WITH CYSTOSTOPY/ BILATERAL RETROGRADE PYELOGRAM WITH LEFT STENT PLACEMENT;  Surgeon: Raynelle Bring, MD;  Location: [facility];  Service: Urology;  Laterality: N/A;  GENERAL ANESTHESIA WITHH PARALYSIS   TUBAL LIGATION     Allergies  Allergen Reactions   Codeine Nausea And Vomiting   Prior to Admission medications   Medication Sig Start Date End Date Taking? Authorizing Provider  ASPIRIN 81 PO Take 81 mg by mouth daily.    [provider]  atorvastatin (LIPITOR) 10 MG tablet Take 1 tablet (10 mg total) by mouth daily. 04/22/21   Wendie Agreste, MD  budesonide-formoterol [facility]) 160-4.5 MCG/ACT inhaler Inhale 2 puffs into the lungs 2 (two) times daily.    [provider]  Calcium Carb-Cholecalciferol (CALCIUM 600/VITAMIN D3 PO) Take 1 tablet by mouth daily.    [provider]  escitalopram (LEXAPRO) 20 MG tablet Take 1 tablet (20 mg total) by mouth at bedtime. 04/22/21   Wendie Agreste, MD  hydrOXYzine (ATARAX/VISTARIL) 25 MG tablet TAKE 1/2 TO 1 TABLETS BY MOUTH EVERY 8 (EIGHT) HOURS AS NEEDED FOR ANXIETY. 05/31/21   Wendie Agreste, MD  lisinopril (ZESTRIL) 20 MG tablet Take 20 mg by mouth daily. 11/03/20   [provider]  meloxicam (MOBIC) 7.5 MG tablet TAKE 1 TABLET (7.5 MG TOTAL) BY MOUTH AT BEDTIME AS NEEDED FOR PAIN. 05/04/21   Wendie Agreste, MD  metFORMIN (GLUCOPHAGE) 500 MG tablet Take 1 tablet (500 mg total) by mouth daily with  breakfast. 04/22/21   Wendie Agreste, MD  phenazopyridine (PYRIDIUM) 200 MG tablet Take 1 tablet (200 mg total) by mouth 3 (three) times daily as needed for pain. 12/21/20   Raynelle Bring, MD  vitamin E 180 MG (400 UNITS) capsule Take 400 Units by mouth daily.    [provider]   Social History   Socioeconomic History   Marital status: Widowed    Spouse name: Not on file   Number of children: Not on file   Years of education: Not on file   Highest education level: Not on file  Occupational History   Not on file  Tobacco Use  Smoking status: Every Day    Packs/day: 0.50    Types: Cigarettes   Smokeless tobacco: Never   Tobacco comments:    increased smoking again due to stress  Vaping Use   Vaping Use: Never used  Substance and Sexual Activity   Alcohol use: No   Drug use: No   Sexual activity: Never    Birth control/protection: Surgical, Post-menopausal  Other Topics Concern   Not on file  Social History Narrative   Not on file   Social Determinants of Health   Financial Resource Strain: Not on file  Food Insecurity: Not on file  Transportation Needs: Not on file  Physical Activity: Not on file  Stress: Not on file  Social Connections: Not on file  Intimate Partner Violence: Not on file    Review of Systems   Objective:   Vitals:   07/22/21 1323 07/22/21 1330  BP: (!) 177/105 (!) 163/99  Pulse: 90   Resp: 18   Temp: 98.2 F (36.8 C)   TempSrc: Temporal   SpO2: 98%   Weight: 168 lb 3.2 oz (76.3 kg)   Height: '5\' 4"'  (1.626 m)      Physical Exam Constitutional:      General: She is not in acute distress.    Appearance: Normal appearance. She is well-developed. She is not ill-appearing.  HENT:     Head: Normocephalic and atraumatic.  Cardiovascular:     Rate and Rhythm: Normal rate.  Pulmonary:     Effort: Pulmonary effort is normal.  Neurological:     Mental Status: She is alert and oriented to person, place, and time.  Psychiatric:         Mood and Affect: Mood normal.     35 minutes spent during visit, including chart review, counseling and discussion regarding various stressors, anxiety, treatment options.  assimilation of information, exam, discussion of plan, and chart completion.    Assessment & Plan:  Vicki [name] is a 60 y.o. female . Fatigue, unspecified type  Hyperlipidemia, unspecified hyperlipidemia type  Depression with anxiety - Plan: hydrOXYzine (ATARAX) 10 MG tablet  Daytime sleepiness  Situational stress - Plan: hydrOXYzine (ATARAX) 10 MG tablet  Muscle cramps  Reassuring labs last visit.  Still may be multifactorial with fatigue, underlying sleep apnea but appears to be primarily stress/anxiety.  Declined counseling but recommended and discussed reasons for counseling.  Can provide numbers if she changes her mind.  At max dose of Lexapro at this time.  Option of psychiatry referral but declined at this time.  We will try a lower dose hydroxyzine with potential side effects discussed.  Handout given on management of stress and anxiety.  Recheck 3 months.  Nighttime muscle cramps discussed, continue stretching with exercise, low intensity exercise may also help with stress and anxiety, commended on 3 times per week exercise.  Recommended stretching at bedtime as well, but also trial off statin for 2 weeks.  If myalgias improve then intermittent dosing or change in meds.  If no change restart statin and follow-up to discuss other causes.  RTC precautions given.  Meds ordered this encounter  Medications   hydrOXYzine (ATARAX) 10 MG tablet    Sig: Take 0.5-1 tablets (5-10 mg total) by mouth 3 (three) times daily as needed.    Dispense:  30 tablet    Refill:  0    Patient Instructions  See information on stress/anxiety.  I would recommend meeting with a therapist - let me know if  I can provide numbers.  I am also happy to refer you to psychiatrist if meds are not helping.  Can try lower dose  hydroxyzine up to 3 times per day for anxiety or sleep if needed.  Low intensity exercise is great to help manage anxiety. Stretch before bedtime as well and make sure to drink plenty of water.  Stop atorvastatin for 2 weeks. If the cramps go away, we can try intermittent dosing or different med - let me know. If no change in cramps, restart atorvastatin and we can look at other causes.   Take care and let me know if I can help further.    Managing Anxiety, Adult After being diagnosed with anxiety, you may be relieved to know why you have felt or behaved a certain way. You may also feel overwhelmed about the treatment ahead and what it will mean for your life. With care and support, you can manage this condition. How to manage lifestyle changes Managing stress and anxiety Stress is your body's reaction to life changes and events, both good and bad. Most stress will last just a few hours, but stress can be ongoing and can lead to more than just stress. Although stress can play a major role in anxiety, it is not the same as anxiety. Stress is usually caused by something external, such as a deadline, test, or competition. Stress normally passes after the triggering event has ended.  Anxiety is caused by something internal, such as imagining a terrible outcome or worrying that something will go wrong that will devastate you. Anxiety often does not go away even after the triggering event is over, and it can become long-term (chronic) worry. It is important to understand the differences between stress and anxiety and to manage your stress effectively so that it does not lead to an anxious response. Talk with your health care provider or a counselor to learn more about reducing anxiety and stress. He or she may suggest tension reduction techniques, such as: Music therapy. Spend time creating or listening to music that you enjoy and that inspires you. Mindfulness-based meditation. Practice being aware of your  normal breaths while not trying to control your breathing. It can be done while sitting or walking. Centering prayer. This involves focusing on a word, phrase, or sacred image that means something to you and brings you peace. Deep breathing. To do this, expand your stomach and inhale slowly through your nose. Hold your breath for 3-5 seconds. Then exhale slowly, letting your stomach muscles relax. Self-talk. Learn to notice and identify thought patterns that lead to anxiety reactions and change those patterns to thoughts that feel peaceful. Muscle relaxation. Taking time to tense muscles and then relax them. Choose a tension reduction technique that fits your lifestyle and personality. These techniques take time and practice. Set aside 5-15 minutes a day to do them. Therapists can offer counseling and training in these techniques. The training to help with anxiety may be covered by some insurance plans. Other things you can do to manage stress and anxiety include: Keeping a stress diary. This can help you learn what triggers your reaction and then learn ways to manage your response. Thinking about how you react to certain situations. You may not be able to control everything, but you can control your response. Making time for activities that help you relax and not feeling guilty about spending your time in this way. Doing visual imagery. This involves imagining or creating mental pictures to help  you relax. Practicing yoga. Through yoga poses, you can lower tension and promote relaxation.  Medicines Medicines can help ease symptoms. Medicines for anxiety include: Antidepressant medicines. These are usually prescribed for long-term daily control. Anti-anxiety medicines. These may be added in severe cases, especially when panic attacks occur. Medicines will be prescribed by a health care provider. When used together, medicines, psychotherapy, and tension reduction techniques may be the most effective  treatment. Relationships Relationships can play a big part in helping you recover. Try to spend more time connecting with trusted friends and family members. Consider going to couples counseling if you have a partner, taking family education classes, or going to family therapy. Therapy can help you and others better understand your condition. How to recognize changes in your anxiety Everyone responds differently to treatment for anxiety. Recovery from anxiety happens when symptoms decrease and stop interfering with your daily activities at home or work. This may mean that you will start to: Have better concentration and focus. Worry will interfere less in your daily thinking. Sleep better. Be less irritable. Have more energy. Have improved memory. It is also important to recognize when your condition is getting worse. Contact your health care provider if your symptoms interfere with home or work and you feel like your condition is not improving. Follow these instructions at home: Activity Exercise. Adults should do the following: Exercise for at least 150 minutes each week. The exercise should increase your heart rate and make you sweat (moderate-intensity exercise). Strengthening exercises at least twice a week. Get the right amount and quality of sleep. Most adults need 7-9 hours of sleep each night. Lifestyle  Eat a healthy diet that includes plenty of vegetables, fruits, whole grains, low-fat dairy products, and lean protein. Do not eat a lot of foods that are high in fats, added sugars, or salt (sodium). Make choices that simplify your life. Do not use any products that contain nicotine or tobacco. These products include cigarettes, chewing tobacco, and vaping devices, such as e-cigarettes. If you need help quitting, ask your health care provider. Avoid caffeine, alcohol, and certain over-the-counter cold medicines. These may make you feel worse. Ask your pharmacist which medicines to  avoid. General instructions Take over-the-counter and prescription medicines only as told by your health care provider. Keep all follow-up visits. This is important. Where to find support You can get help and support from these sources: Self-help groups. Online and [employer]. A trusted spiritual leader. Couples counseling. Family education classes. Family therapy. Where to find more information You may find that joining a support group helps you deal with your anxiety. The following sources can help you locate counselors or support groups near you: [place]: www.mentalhealthamerica.net Anxiety and Depression Association of [place] (ADAA): https://www.clark.net/ National Alliance on Mental Illness (NAMI): www.nami.org Contact a health care provider if: You have a hard time staying focused or finishing daily tasks. You spend many hours a day feeling worried about everyday life. You become exhausted by worry. You start to have headaches or frequently feel tense. You develop chronic nausea or diarrhea. Get help right away if: You have a racing heart and shortness of breath. You have thoughts of hurting yourself or others. If you ever feel like you may hurt yourself or others, or have thoughts about taking your own life, get help right away. Go to your nearest emergency department or: Call your local emergency services (911 in the [place] Call a suicide crisis helpline, such as the National Suicide  Prevention Lifeline at 918-772-7765 or 988 in the [place] This is open 24 hours a day in the [place] Text the Crisis Text Line at 819-465-6834 (in the [place] Summary Taking steps to learn and use tension reduction techniques can help calm you and help prevent triggering an anxiety reaction. When used together, medicines, psychotherapy, and tension reduction techniques may be the most effective treatment. Family, friends, and partners can play a big part in supporting you. This  information is not intended to replace advice given to you by your health care provider. Make sure you discuss any questions you have with your health care provider. Document Revised: 02/17/2021 Document Reviewed: 11/15/2020 Elsevier Patient Education  2022 [place], Adult Feeling a certain amount of stress is normal. Stress helps our body and mind get ready to deal with the demands of life. Stress hormones can motivate you to do well at work and meet your responsibilities. But severe or long-term (chronic) stress can affect your mental and physical health. Chronic stress puts you at higher risk for: Anxiety and depression. Other health problems such as digestive problems, muscle aches, heart disease, high blood pressure, and stroke. What are the causes? Common causes of stress include: Demands from work, such as deadlines, feeling overworked, or having long hours. Pressures at home, such as money issues, disagreements with a spouse, or parenting issues. Pressures from major life changes, such as divorce, moving, loss of a loved one, or chronic illness. You may be at higher risk for stress-related problems if you: Do not get enough sleep. Are in poor health. Do not have emotional support. Have a mental health disorder such as anxiety or depression. How to recognize stress Stress can make you: Have trouble sleeping. Feel sad, anxious, irritable, or overwhelmed. Lose your appetite. Overeat or want to eat unhealthy foods. Want to use drugs or alcohol. Stress can also cause physical symptoms, such as: Sore, tense muscles, especially in the shoulders and neck. Headaches. Trouble breathing. A faster heart rate. Stomach pain, nausea, or vomiting. Diarrhea or constipation. Trouble concentrating. Follow these instructions at home: Eating and drinking Eat a healthy diet. This includes: Eating foods that are high in fiber, such as beans, whole grains, and fresh  fruits and vegetables. Limiting foods that are high in fat and processed sugars, such as fried or sweet foods. Do not skip meals or overeat. Drink enough fluid to keep your urine pale yellow. Alcohol use Do not drink alcohol if: Your health care provider tells you not to drink. You are pregnant, may be pregnant, or are planning to become pregnant. Drinking alcohol is a way some people try to ease their stress. This can be dangerous, so if you drink alcohol: Limit how much you have to: 0-1 drink a day for women. 0-2 drinks a day for men. Know how much alcohol is in your drink. In the [place] one drink equals one 12 oz bottle of beer (355 mL), one 5 oz glass of wine (148 mL), or one 1 oz glass of hard liquor (44 mL). Activity  Include 30 minutes of exercise in your daily schedule. Exercise is a good stress reducer. Include time in your day for an activity that you find relaxing. Try taking a walk, going on a bike ride, reading a book, or listening to music. Schedule your time in a way that lowers stress, and keep a regular schedule. Focus on doing what is most important to get done. Lifestyle Identify the source of  your stress and your reaction to it. See a therapist who can help you change unhelpful reactions. When there are stressful events: Talk about them with family, friends, or coworkers. Try to think realistically about stressful events and not ignore them or overreact. Try to find the positives in a stressful situation and not focus on the negatives. Cut back on responsibilities at work and home, if possible. Ask for help from friends or family members if you need it. Find ways to manage stress, such as: Mindfulness, meditation, or deep breathing. Yoga or tai chi. Progressive muscle relaxation. Spending time in nature. Doing art, playing music, or reading. Making time for fun activities. Spending time with family and friends. Get support from family, friends, or spiritual  resources. General instructions Get enough sleep. Try to go to sleep and get up at about the same time every day. Take over-the-counter and prescription medicines only as told by your health care provider. Do not use any products that contain nicotine or tobacco. These products include cigarettes, chewing tobacco, and vaping devices, such as e-cigarettes. If you need help quitting, ask your health care provider. Do not use drugs or smoke to deal with stress. Keep all follow-up visits. This is important. Where to find support Talk with your health care provider about stress management or finding a support group. Find a therapist to work with you on your stress management techniques. Where to find more information [employer] on Mental Illness: www.nami.org American Psychological Association: TVStereos.ch Contact a health care provider if: Your stress symptoms get worse. You are unable to manage your stress at home. You are struggling to stop using drugs or alcohol. Get help right away if: You may be a danger to yourself or others. You have any thoughts of death or suicide. Get help right awayif you feel like you may hurt yourself or others, or have thoughts about taking your own life. Go to your nearest emergency room or: Call 911. Call the [place] at 831-593-9832 or 988 in the [place] This is open 24 hours a day. Text the Crisis Text Line at 9416114944. Summary Feeling a certain amount of stress is normal, but severe or long-term (chronic) stress can affect your mental and physical health. Chronic stress can put you at higher risk for anxiety, depression, and other health problems such as digestive problems, muscle aches, heart disease, high blood pressure, and stroke. You may be at higher risk for stress-related problems if you do not get enough sleep, are in poor health, lack emotional support, or have a mental health disorder such as anxiety or  depression. Identify the source of your stress and your reaction to it. Try talking about stressful events with family, friends, or coworkers, finding a coping method, or getting support from spiritual resources. If you need more help, talk with your health care provider about finding a support group or a mental health therapist. This information is not intended to replace advice given to you by your health care provider. Make sure you discuss any questions you have with your health care provider. Document Revised: 02/18/2021 Document Reviewed: 02/16/2021 Elsevier Patient Education  2022 [name].    If you have lab work done today you will be contacted with your lab results within the next 2 weeks.  If you have not heard from [place] then please contact us. The fastest way to get your results is to register for My Chart.   IF you received an x-ray today, you  will receive an invoice from [facility]. Please contact [facility] at [phone] with questions or concerns regarding your invoice.   IF you received labwork today, you will receive an invoice from [facility]. Please contact [facility] at [phone] with questions or concerns regarding your invoice.   Our billing staff will not be able to assist you with questions regarding bills from these companies.  You will be contacted with the lab results as soon as they are available. The fastest way to get your results is to activate your My Chart account. Instructions are located on the last page of this paperwork. If you have not heard from [place] regarding the results in 2 weeks, please contact this office.        Signed,   Merri Ray, MD [place] 07/22/21 2:19 PM

## 2021-07-26 ENCOUNTER — Other Ambulatory Visit: Payer: Self-pay | Admitting: Family Medicine

## 2021-07-26 DIAGNOSIS — G8929 Other chronic pain: Secondary | ICD-10-CM

## 2021-09-14 ENCOUNTER — Ambulatory Visit: Payer: 59 | Admitting: Adult Health

## 2021-10-25 ENCOUNTER — Other Ambulatory Visit: Payer: Self-pay | Admitting: Family Medicine

## 2021-11-02 ENCOUNTER — Other Ambulatory Visit: Payer: Self-pay | Admitting: Family Medicine

## 2021-11-02 DIAGNOSIS — G8929 Other chronic pain: Secondary | ICD-10-CM

## 2021-11-03 NOTE — Telephone Encounter (Signed)
Patient is requesting a refill of the following medications: ?Requested Prescriptions  ? ?Pending Prescriptions Disp Refills  ? meloxicam (MOBIC) 7.5 MG tablet [Pharmacy Med Name: MELOXICAM 7.5 MG ORAL TABLET] 30 tablet 2  ?  Sig: TAKE 1 TABLET (7.5 MG TOTAL) BY MOUTH AT BEDTIME AS NEEDED FOR PAIN.  ? ? ?Date of patient request: 11/02/21 ?Last office visit: 07/22/21 ?Date of last refill: 07/26/21 ?Last refill amount: 30x2R ? ? ?

## 2021-11-04 ENCOUNTER — Ambulatory Visit (INDEPENDENT_AMBULATORY_CARE_PROVIDER_SITE_OTHER): Payer: 59 | Admitting: Family Medicine

## 2021-11-04 ENCOUNTER — Ambulatory Visit (INDEPENDENT_AMBULATORY_CARE_PROVIDER_SITE_OTHER)
Admission: RE | Admit: 2021-11-04 | Discharge: 2021-11-04 | Disposition: A | Discharge status: Home / Self Care | Payer: 59 | Source: Ambulatory Visit | Attending: Family Medicine | Admitting: Family Medicine

## 2021-11-04 VITALS — BP 160/88 | HR 73 | Temp 98.1°F | Ht 64.0 in | Wt 167.4 lb

## 2021-11-04 DIAGNOSIS — R7303 Prediabetes: Secondary | ICD-10-CM | POA: Diagnosis not present

## 2021-11-04 DIAGNOSIS — J441 Chronic obstructive pulmonary disease with (acute) exacerbation: Secondary | ICD-10-CM

## 2021-11-04 DIAGNOSIS — F418 Other specified anxiety disorders: Secondary | ICD-10-CM

## 2021-11-04 DIAGNOSIS — M791 Myalgia, unspecified site: Secondary | ICD-10-CM

## 2021-11-04 DIAGNOSIS — E785 Hyperlipidemia, unspecified: Secondary | ICD-10-CM | POA: Diagnosis not present

## 2021-11-04 DIAGNOSIS — G4733 Obstructive sleep apnea (adult) (pediatric): Secondary | ICD-10-CM

## 2021-11-04 DIAGNOSIS — R5383 Other fatigue: Secondary | ICD-10-CM | POA: Diagnosis not present

## 2021-11-04 DIAGNOSIS — R42 Dizziness and giddiness: Secondary | ICD-10-CM | POA: Diagnosis not present

## 2021-11-04 DIAGNOSIS — I10 Essential (primary) hypertension: Secondary | ICD-10-CM

## 2021-11-04 LAB — CBC
HCT: 40.8 % (ref 36.0–46.0)
Hemoglobin: 13.5 g/dL (ref 12.0–15.0)
MCHC: 33.1 g/dL (ref 30.0–36.0)
MCV: 87.1 fl (ref 78.0–100.0)
Platelets: 368 10*3/uL (ref 150.0–400.0)
RBC: 4.68 Mil/uL (ref 3.87–5.11)
RDW: 13.4 % (ref 11.5–15.5)
WBC: 7.8 10*3/uL (ref 4.0–10.5)

## 2021-11-04 LAB — LIPID PANEL
Cholesterol: 174 mg/dL (ref 0–200)
HDL: 51.8 mg/dL (ref >39.00)
LDL Cholesterol: 100 mg/dL — ABNORMAL HIGH (ref 0–99)
NonHDL: 122.01
Total CHOL/HDL Ratio: 3
Triglycerides: 109 mg/dL (ref 0.0–149.0)
VLDL: 21.8 mg/dL (ref 0.0–40.0)

## 2021-11-04 LAB — COMPREHENSIVE METABOLIC PANEL
ALT: 13 U/L (ref 0–35)
AST: 12 U/L (ref 0–37)
Albumin: 4.4 g/dL (ref 3.5–5.2)
Alkaline Phosphatase: 75 U/L (ref 39–117)
BUN: 16 mg/dL (ref 6–23)
CO2: 32 mEq/L (ref 19–32)
Calcium: 9.5 mg/dL (ref 8.4–10.5)
Chloride: 100 mEq/L (ref 96–112)
Creatinine, Ser: 0.66 mg/dL (ref 0.40–1.20)
GFR: 94.33 mL/min (ref >60.00)
Glucose, Bld: 92 mg/dL (ref 70–99)
Potassium: 4.3 mEq/L (ref 3.5–5.1)
Sodium: 139 mEq/L (ref 135–145)
Total Bilirubin: 0.4 mg/dL (ref 0.2–1.2)
Total Protein: 7 g/dL (ref 6.0–8.3)

## 2021-11-04 LAB — HEMOGLOBIN A1C: Hgb A1c MFr Bld: 6.5 % (ref 4.6–6.5)

## 2021-11-04 LAB — TSH: TSH: 1.24 u[IU]/mL (ref 0.35–5.50)

## 2021-11-04 MED ORDER — PREDNISONE 20 MG PO TABS: 40.0000 mg | ORAL_TABLET | Freq: Every day | ORAL | 0 refills | Status: DC

## 2021-11-04 MED ORDER — HYDROCHLOROTHIAZIDE 12.5 MG PO CAPS: 12.5000 mg | ORAL_CAPSULE | Freq: Every day | ORAL | 1 refills | Status: DC

## 2021-11-04 MED ORDER — DOXYCYCLINE HYCLATE 100 MG PO TABS: 100.0000 mg | ORAL_TABLET | Freq: Two times a day (BID) | ORAL | 0 refills | Status: DC

## 2021-11-04 MED ORDER — ESCITALOPRAM OXALATE 20 MG PO TABS: 20.0000 mg | ORAL_TABLET | Freq: Every day | ORAL | 1 refills | Status: DC

## 2021-11-04 NOTE — Progress Notes (Signed)
? ?Subjective:  ?Patient ID: Vicki [name], female    DOB: 11/23/1960  Age: 61 y.o. MRN: [id number] ? ?CC:  ?Chief Complaint  ?Patient presents with  ? Fatigue  ?  Recheck on fatigue, is feeling worse now has children and grand kids living wit her.GAD Score 14  ? Hyperlipidemia  ?  Pt had stopped statin to see if cramping would resolve, if does not resolve restart statin patient states she never stopped taking the medication but her right leg is still cramping   ? Health Maintenance  ?  Pt is due for colonoscopy and did want to discuss if she should proceed   ? ? ?HPI ?Vicki [name] presents for  ? ?Fatigue ?Follow-up fatigue ?Last visit 07/22/2021.  Multiple concerns addressed at that time including elevated blood pressure.  Felt to be multiple contributors.  Increase stressors at home.  Recommended follow-up with sleep specialist.  Was having some cramps in her legs at night.  Was not able to use her CPAP machine at that time.  Information provided on stress and anxiety and recommended meeting with therapist.  Option to meet with psychiatrist if meds were not helping.  Lexapro 20 mg daily.  Lower dose hydroxyzine up to 3 times per day for anxiety or sleep.  Low intensity exercise recommended.  Recommended stopping atorvastatin temporarily for myalgias. ? ?Still with social stressors, grandkids living with her as well as dtr and husband. Dtr and grandkids moving out next week - feels like things will be better. Son in prison calling for money as well ?Taking lexapro daily. no suicidal thoughts. ?Hydroxyzine 1/2 pill as needed when anxious.  ?Has not called sleep specialist.  ?Did not stop statin. Still soreness in shoulder - chronic. R knee arthritis, swelling in R knee. Some cramping in R thigh. ?Declines meeting with therapist - didn't feel like helped.  ? ? ? ?  11/04/2021  ? 10:31 AM 07/22/2021  ?  1:25 PM 04/22/2021  ?  2:50 PM 02/15/2021  ? 11:00 AM 10/14/2020  ?  2:09 PM  ?Depression screen PHQ 2/9   ?Decreased Interest '3 3 1 1 1  '$ ?Down, Depressed, Hopeless 1 2 0 0 1  ?PHQ - 2 Score '4 5 1 1 2  '$ ?Altered sleeping '3 1 3 2 3  '$ ?Tired, decreased energy 3 3 0 3 3  ?Change in appetite 2 0 0 1 2  ?Feeling bad or failure about yourself  0 1 0 0 0  ?Trouble concentrating 1 0 0 0 0  ?Moving slowly or fidgety/restless 1 0 0 2 3  ?Suicidal thoughts 0 0 0 0 0  ?PHQ-9 Score '14 10 4 9 13  '$ ?Difficult doing work/chores Somewhat difficult Somewhat difficult     ? ? ?  02/15/2021  ? 11:02 AM 10/14/2020  ?  2:11 PM 03/13/2020  ?  1:51 PM 12/13/2019  ? 10:26 AM  ?GAD 7 : Generalized Anxiety Score  ?Nervous, Anxious, on Edge '1 1 1 '$ 0  ?Control/stop worrying 0 1 1 0  ?Worry too much - different things 1 0 2 0  ?Trouble relaxing 0 0 0 0  ?Restless 0 3 0 0  ?Easily annoyed or irritable 0 1 1 0  ?Afraid - awful might happen '3 1 1 '$ 0  ?Total GAD 7 Score '5 7 6 '$ 0  ?Anxiety Difficulty   Not difficult at all Not difficult at all  ? ?Hypertension: ?Lisinopril 20 mg daily. No missed doses. No CP/palpitations.  ?  Home readings:none. Dizzy if getting up quickly. Drinking water, and soda. Coffee as well - 4 cups.  ?BP Readings from Last 3 Encounters:  ?11/04/21 (!) 160/88  ?07/22/21 (!) 163/99  ?04/22/21 (!) 142/86  ? ?Lab Results  ?Component Value Date  ? CREATININE 0.74 04/22/2021  ? ?Cough: ?Past 2 weeks with green mucus, blowing nose a lot. Green phlegm.  ?Covid test negative last week ?No fever. Short of breath in the morning and late afternoon. Symbicort has been helpful BID.  Hx of COPD. More wheezing past 2 weeks.  ?Albuterol 2-3 times per week - more with recent illness - once per night.  ? ? ?History ?Patient Active Problem List  ? Diagnosis Date Noted  ? 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 Influenza A 09/26/2018  ? COPD with acute exacerbation ([place]) 09/26/2018  ? Neck pain 02/05/2016  ? Leg swelling 01/19/2016  ? Dry cough 02/11/2015  ? Fatigue 02/11/2015  ? Prediabetes 10/29/2014  ? Polyuria 10/28/2014  ? Hyperglycemia  10/28/2014  ? Left breast mass 04/11/2014  ? Right hip pain 03/21/2014  ? Edema, lower extremity 03/21/2014  ? Low back pain 12/17/2013  ? Right knee pain 12/17/2013  ? Chest pain, central 11/05/2013  ? Lateral epicondylitis of left elbow 11/05/2013  ? Obstructive chronic bronchitis without exacerbation ([place]) 08/23/2013  ? Wrist fracture, right 07/08/2013  ? Sciatica 05/28/2013  ? Grief reaction 04/02/2013  ? Myalgia and myositis, unspecified 03/07/2012  ? Rotator cuff syndrome of left shoulder 03/07/2012  ? Depression with anxiety 03/07/2012  ? Foot deformity, acquired 06/21/2011  ? Callous ulcer ([place]) 06/21/2011  ? Acquired hallux valgus of left foot 05/10/2011  ? Sleep disorder 02/04/2011  ? Rotator cuff tear, right 09/03/2010  ? SHOULDER PAIN, RIGHT 08/20/2010  ? TOBACCO ABUSE 06/11/2010  ? DEPRESSIVE DISORDER NOT ELSEWHERE CLASSIFIED 06/11/2010  ? ?Past Medical History:  ?Diagnosis Date  ? Acquired hallux valgus of left foot   ? Bladder tumor   ? Callous ulcer ([place])   ? Depression with anxiety   ? DEPRESSIVE DISORDER NOT ELSEWHERE CLASSIFIED   ? Diabetes mellitus without complication ([place])   ? Phreesia 10/13/2020  ? Dyspnea   ? Foot deformity, acquired   ? Grief reaction   ? History of kidney stones   ? Hyperlipidemia   ? Phreesia 10/13/2020  ? Hypertension   ? Phreesia 10/13/2020  ? Myalgia and myositis, unspecified   ? Onychomycosis   ? Oral thrush   ? Pneumonia   ? 4 months ago  ? Pre-diabetes   ? Rotator cuff syndrome of left shoulder   ? Rotator cuff tear, right   ? Shortness of breath   ? SHOULDER PAIN, RIGHT   ? Sleep apnea   ? Phreesia 10/13/2020  ? Sleep disorder   ? Urinary hesitancy   ? ?Past Surgical History:  ?Procedure Laterality Date  ? cryotherphy  1986  ? TRANSURETHRAL RESECTION OF BLADDER TUMOR N/A 12/21/2020  ? Procedure: TRANSURETHRAL RESECTION OF BLADDER TUMOR (TURBT)WITH CYSTOSTOPY/ BILATERAL RETROGRADE PYELOGRAM WITH LEFT STENT PLACEMENT;  Surgeon: Raynelle Bring, MD;  Location: [facility];   Service: Urology;  Laterality: N/A;  GENERAL ANESTHESIA WITHH PARALYSIS  ? TUBAL LIGATION    ? ?Allergies  ?Allergen Reactions  ? Codeine Nausea And Vomiting  ? ?Prior to Admission medications   ?Medication Sig Start Date End Date Taking? Authorizing Provider  ?ASPIRIN 81 PO Take 81 mg by mouth daily.   Yes [provider]  ?  atorvastatin (LIPITOR) 10 MG tablet Take 1 tablet (10 mg total) by mouth daily. 04/22/21  Yes Wendie Agreste, MD  ?budesonide-formoterol [facility]) 160-4.5 MCG/ACT inhaler Inhale 2 puffs into the lungs 2 (two) times daily.   Yes [provider]  ?Calcium Carb-Cholecalciferol (CALCIUM 600/VITAMIN D3 PO) Take 1 tablet by mouth daily.   Yes [provider]  ?escitalopram (LEXAPRO) 20 MG tablet Take 1 tablet (20 mg total) by mouth at bedtime. 04/22/21  Yes Wendie Agreste, MD  ?hydrOXYzine (ATARAX) 25 MG tablet TAKE 1/2 TO 1 TABLETS BY MOUTH EVERY 8 (EIGHT) HOURS AS NEEDED FOR ANXIETY. 10/25/21  Yes Wendie Agreste, MD  ?lisinopril (ZESTRIL) 20 MG tablet Take 20 mg by mouth daily. 11/03/20  Yes [provider]  ?meloxicam (MOBIC) 7.5 MG tablet TAKE 1 TABLET (7.5 MG TOTAL) BY MOUTH AT BEDTIME AS NEEDED FOR PAIN. 11/03/21  Yes Wendie Agreste, MD  ?metFORMIN (GLUCOPHAGE) 500 MG tablet Take 1 tablet (500 mg total) by mouth daily with breakfast. 04/22/21  Yes Wendie Agreste, MD  ?vitamin E 180 MG (400 UNITS) capsule Take 400 Units by mouth daily.   Yes [provider]  ?hydrOXYzine (ATARAX) 10 MG tablet Take 0.5-1 tablets (5-10 mg total) by mouth 3 (three) times daily as needed. 07/22/21   Wendie Agreste, MD  ?phenazopyridine (PYRIDIUM) 200 MG tablet Take 1 tablet (200 mg total) by mouth 3 (three) times daily as needed for pain. ?Patient not taking: Reported on 11/04/2021 12/21/20   Raynelle Bring, MD  ? ?Social History  ? ?Socioeconomic History  ? Marital status: Widowed  ?  Spouse name: Not on file  ? Number of children: Not on file  ? Years of  education: Not on file  ? Highest education level: Not on file  ?Occupational History  ? Not on file  ?Tobacco Use  ? Smoking status: Every Day  ?  Packs/day: 0.50  ?  Types: Cigarettes  ? Smokeless tobacco: Never  ?

## 2021-11-04 NOTE — Patient Instructions (Addendum)
Call sleep specialist to discuss new machine or follow up if needed as untreated sleep apnea may be affecting your fatigue.  ?Blood pressure too high. Add low dose fluid pill and recheck in next 1week.  ?You can try stopping lipitor for a few weeks to see if helps muscle aches.  ? ?I will refer you to psychiatry to discuss med changes, but would also recommend therapy - let me know if that is something you would consider.  ?Start prednisone, doxycycline for copd and chest symptoms. Xray at [facility]. If not improving int next few days, please be seen. Recheck 1 week.  ? ?Cut back on caffeine and increase water intake. If any worsening dizziness be seen here, or in ER.  ?[facility] xray: ?Walk in 8:30-4:30 during weekdays, no appointment needed ?[place]  ?[place], [place] [address] ? ? ?Return to the clinic or go to the nearest emergency room if any of your symptoms worsen or new symptoms occur. ? ? ?

## 2021-11-11 ENCOUNTER — Ambulatory Visit: Payer: 59 | Admitting: Family Medicine

## 2021-12-02 ENCOUNTER — Ambulatory Visit: Payer: 59 | Admitting: Family Medicine

## 2021-12-22 ENCOUNTER — Ambulatory Visit (INDEPENDENT_AMBULATORY_CARE_PROVIDER_SITE_OTHER): Payer: 59 | Admitting: Family Medicine

## 2021-12-22 ENCOUNTER — Other Ambulatory Visit: Payer: Self-pay | Admitting: Family Medicine

## 2021-12-22 VITALS — BP 140/82 | HR 75 | Temp 98.1°F | Resp 17 | Ht 64.0 in | Wt 164.0 lb

## 2021-12-22 DIAGNOSIS — M25562 Pain in left knee: Secondary | ICD-10-CM

## 2021-12-22 DIAGNOSIS — J309 Allergic rhinitis, unspecified: Secondary | ICD-10-CM | POA: Diagnosis not present

## 2021-12-22 DIAGNOSIS — E785 Hyperlipidemia, unspecified: Secondary | ICD-10-CM

## 2021-12-22 DIAGNOSIS — R0789 Other chest pain: Secondary | ICD-10-CM | POA: Diagnosis not present

## 2021-12-22 DIAGNOSIS — R059 Cough, unspecified: Secondary | ICD-10-CM

## 2021-12-22 DIAGNOSIS — Z1211 Encounter for screening for malignant neoplasm of colon: Secondary | ICD-10-CM

## 2021-12-22 DIAGNOSIS — I10 Essential (primary) hypertension: Secondary | ICD-10-CM

## 2021-12-22 MED ORDER — FLUTICASONE PROPIONATE 50 MCG/ACT NA SUSP: 2.0000 | Freq: Every day | NASAL | 6 refills | Status: DC

## 2021-12-22 NOTE — Progress Notes (Signed)
? ?Subjective:  ?Patient ID: Vicki [name], female    DOB: 1960/08/23  Age: 61 y.o. MRN: [id number] ? ?CC:  ?Chief Complaint  ?Patient presents with  ? Hyperlipidemia  ?  Patient states she is here to follow for HLD. And some back pain right at bra strap.  ? ? ?HPI ?Vicki [name] presents for  ? ?Hyperlipidemia: ?Discussed arthralgias at her March visit.  CPK ordered but was not run., option of temporary statin hold to see if that changed her symptoms. ?Stopped statin since last visit in March.  ?No change in arthralgias.  ?95yo granddaughter with CP recently passed away - doing ok. Passed from [facility].  ? ?Lab Results  ?Component Value Date  ? CHOL 174 11/04/2021  ? HDL 51.80 11/04/2021  ? LDLCALC 100 (H) 11/04/2021  ? LDLDIRECT 114 (H) 06/11/2010  ? TRIG 109.0 11/04/2021  ? CHOLHDL 3 11/04/2021  ? ?Lab Results  ?Component Value Date  ? ALT 13 11/04/2021  ? AST 12 11/04/2021  ? ALKPHOS 75 11/04/2021  ? BILITOT 0.4 11/04/2021  ? ?Back pain, ?Upper back by bra strap, mid back, past few weeks. No fall/injury. No rash/skin changes. Pressure feeling at night more, some during day. No CP/dyspnea.  Some cough at times with cough fits, then sneezing at times, 4 months.  ?Taking otc allergy med - indoor/outdoor motrin. Min relief. Prior decongestant ns, not recent.  ?Dry cough.  ? ?L knee pain, and swelling: ?Vacuuming 4 days ago, knee felt like it went out of place, followed by some swelling. Soreness improved/went back in place after a few mins- better after  getting on bed, but has been swelling since that time. Hurts in front of knee to into thigh, no hip/groin pain. Swelling main issue and soreness behind thigh.  ? ?Colon CA screening: ?Due for repeat colonoscopy - had letters sent to her reminding her to schedule. [facility]. Requests local GI.  ?No FH of colon CA, but had polyps - repeat 3 years ago.  ? ?History ?Patient Active Problem List  ? Diagnosis Date Noted  ? Cancer of trigone of urinary  bladder ([place]) 01/15/2021  ? Influenza A 09/26/2018  ? COPD with acute exacerbation ([place]) 09/26/2018  ? Neck pain 02/05/2016  ? Leg swelling 01/19/2016  ? Dry cough 02/11/2015  ? Fatigue 02/11/2015  ? Prediabetes 10/29/2014  ? Polyuria 10/28/2014  ? Hyperglycemia 10/28/2014  ? Left breast mass 04/11/2014  ? Right hip pain 03/21/2014  ? Edema, lower extremity 03/21/2014  ? Low back pain 12/17/2013  ? Right knee pain 12/17/2013  ? Chest pain, central 11/05/2013  ? Lateral epicondylitis of left elbow 11/05/2013  ? Obstructive chronic bronchitis without exacerbation ([place]) 08/23/2013  ? Wrist fracture, right 07/08/2013  ? Sciatica 05/28/2013  ? Grief reaction 04/02/2013  ? Myalgia and myositis, unspecified 03/07/2012  ? Rotator cuff syndrome of left shoulder 03/07/2012  ? Depression with anxiety 03/07/2012  ? Foot deformity, acquired 06/21/2011  ? Callous ulcer ([place]) 06/21/2011  ? Acquired hallux valgus of left foot 05/10/2011  ? Sleep disorder 02/04/2011  ? Rotator cuff tear, right 09/03/2010  ? SHOULDER PAIN, RIGHT 08/20/2010  ? TOBACCO ABUSE 06/11/2010  ? DEPRESSIVE DISORDER NOT ELSEWHERE CLASSIFIED 06/11/2010  ? ?Past Medical History:  ?Diagnosis Date  ? Acquired hallux valgus of left foot   ? Bladder tumor   ? Callous ulcer ([place])   ? Depression with anxiety   ? DEPRESSIVE DISORDER NOT ELSEWHERE CLASSIFIED   ? Diabetes  mellitus without complication ([place])   ? Phreesia 10/13/2020  ? Dyspnea   ? Foot deformity, acquired   ? Grief reaction   ? History of kidney stones   ? Hyperlipidemia   ? Phreesia 10/13/2020  ? Hypertension   ? Phreesia 10/13/2020  ? Myalgia and myositis, unspecified   ? Onychomycosis   ? Oral thrush   ? Pneumonia   ? 4 months ago  ? Pre-diabetes   ? Rotator cuff syndrome of left shoulder   ? Rotator cuff tear, right   ? Shortness of breath   ? SHOULDER PAIN, RIGHT   ? Sleep apnea   ? Phreesia 10/13/2020  ? Sleep disorder   ? Urinary hesitancy   ? ?Past Surgical History:  ?Procedure Laterality Date  ?  cryotherphy  1986  ? TRANSURETHRAL RESECTION OF BLADDER TUMOR N/A 12/21/2020  ? Procedure: TRANSURETHRAL RESECTION OF BLADDER TUMOR (TURBT)WITH CYSTOSTOPY/ BILATERAL RETROGRADE PYELOGRAM WITH LEFT STENT PLACEMENT;  Surgeon: Raynelle Bring, MD;  Location: [facility];  Service: Urology;  Laterality: N/A;  GENERAL ANESTHESIA WITHH PARALYSIS  ? TUBAL LIGATION    ? ?Allergies  ?Allergen Reactions  ? Codeine Nausea And Vomiting  ? ?Prior to Admission medications   ?Medication Sig Start Date End Date Taking? Authorizing Provider  ?ASPIRIN 81 PO Take 81 mg by mouth daily.   Yes [provider]  ?atorvastatin (LIPITOR) 10 MG tablet Take 1 tablet (10 mg total) by mouth daily. 04/22/21  Yes Wendie Agreste, MD  ?budesonide-formoterol [facility]) 160-4.5 MCG/ACT inhaler Inhale 2 puffs into the lungs 2 (two) times daily.   Yes [provider]  ?Calcium Carb-Cholecalciferol (CALCIUM 600/VITAMIN D3 PO) Take 1 tablet by mouth daily.   Yes [provider]  ?doxycycline (VIBRA-TABS) 100 MG tablet Take 1 tablet (100 mg total) by mouth 2 (two) times daily. 11/04/21  Yes Wendie Agreste, MD  ?escitalopram (LEXAPRO) 20 MG tablet Take 1 tablet (20 mg total) by mouth at bedtime. 11/04/21  Yes Wendie Agreste, MD  ?hydrochlorothiazide (MICROZIDE) 12.5 MG capsule Take 1 capsule (12.5 mg total) by mouth daily. 11/04/21  Yes Wendie Agreste, MD  ?hydrOXYzine (ATARAX) 10 MG tablet Take 0.5-1 tablets (5-10 mg total) by mouth 3 (three) times daily as needed. 07/22/21  Yes Wendie Agreste, MD  ?hydrOXYzine (ATARAX) 25 MG tablet TAKE 1/2 TO 1 TABLETS BY MOUTH EVERY 8 (EIGHT) HOURS AS NEEDED FOR ANXIETY. 10/25/21  Yes Wendie Agreste, MD  ?lisinopril (ZESTRIL) 20 MG tablet Take 20 mg by mouth daily. 11/03/20  Yes [provider]  ?meloxicam (MOBIC) 7.5 MG tablet TAKE 1 TABLET (7.5 MG TOTAL) BY MOUTH AT BEDTIME AS NEEDED FOR PAIN. 11/03/21  Yes Wendie Agreste, MD  ?metFORMIN (GLUCOPHAGE) 500 MG tablet Take 1  tablet (500 mg total) by mouth daily with breakfast. 04/22/21  Yes Wendie Agreste, MD  ?phenazopyridine (PYRIDIUM) 200 MG tablet Take 1 tablet (200 mg total) by mouth 3 (three) times daily as needed for pain. 12/21/20  Yes Raynelle Bring, MD  ?predniSONE (DELTASONE) 20 MG tablet Take 2 tablets (40 mg total) by mouth daily with breakfast. 11/04/21  Yes Wendie Agreste, MD  ?vitamin E 180 MG (400 UNITS) capsule Take 400 Units by mouth daily.   Yes [provider]  ? ?Social History  ? ?Socioeconomic History  ? Marital status: Widowed  ?  Spouse name: Not on file  ? Number of children: Not on file  ? Years of education: Not  on file  ? Highest education level: Not on file  ?Occupational History  ? Not on file  ?Tobacco Use  ? Smoking status: Every Day  ?  Packs/day: 0.50  ?  Types: Cigarettes  ? Smokeless tobacco: Never  ? Tobacco comments:  ?  increased smoking again due to stress  ?Vaping Use  ? Vaping Use: Never used  ?Substance and Sexual Activity  ? Alcohol use: No  ? Drug use: No  ? Sexual activity: Never  ?  Birth control/protection: Surgical, Post-menopausal  ?Other Topics Concern  ? Not on file  ?Social History Narrative  ? Not on file  ? ?Social Determinants of Health  ? ?Financial Resource Strain: Not on file  ?Food Insecurity: Not on file  ?Transportation Needs: Not on file  ?Physical Activity: Not on file  ?Stress: Not on file  ?Social Connections: Not on file  ?Intimate Partner Violence: Not on file  ? ? ?Review of Systems ? ?Per HPI ?Objective:  ? ?Vitals:  ? 12/22/21 1354  ?BP: 140/82  ?Pulse: 75  ?Resp: 17  ?Temp: 98.1 ?F (36.7 ?C)  ?TempSrc: Temporal  ?SpO2: 97%  ?Weight: 164 lb (74.4 kg)  ?Height: '5\' 4"'$  (1.626 m)  ? ? ? ?Physical Exam ?Vitals reviewed.  ?Constitutional:   ?   Appearance: Normal appearance. She is well-developed.  ?HENT:  ?   Head: Normocephalic and atraumatic.  ?Eyes:  ?   Conjunctiva/sclera: Conjunctivae normal.  ?   Pupils: Pupils are equal, round, and reactive to light.   ?Neck:  ?   Vascular: No carotid bruit.  ?Cardiovascular:  ?   Rate and Rhythm: Normal rate and regular rhythm.  ?   Heart sounds: Normal heart sounds.  ?Pulmonary:  ?   Effort: Pulmonary effort is normal.

## 2021-12-22 NOTE — Patient Instructions (Addendum)
Restart lipitor once per day as unlikely cause of soreness.  ?Try flonase allergy spray for sneezing and may also help cough.  ?Treating allergies should help cough.  ?Soreness in chest may be from cough. Tylenol for chest wall pain, Please have xray performed, follow up if cough and soreness not improving in next 2 weeks.  ?I will refer you for updated colonoscopy.  ?Please have x-ray performed of your knee as well at [facility].  Tylenol is fine if needed for pain.  See information below.  Recheck in 1 week unless any worsening symptoms sooner.  ? ?Vicki [name] ?Walk in 8:30-4:30 during weekdays, no appointment needed ?[place]  ?[name], [place] [address] ? ? ?Acute Knee Pain, Adult ?Acute knee pain is sudden and may be caused by damage, swelling, or irritation of the muscles and tissues that support the knee. Pain may result from: ?A fall. ?An injury to the knee from twisting motions. ?A hit to the knee. ?Infection. ?Acute knee pain may go away on its own with time and rest. If it does not, your health care provider may order tests to find the cause of the pain. These may include: ?Imaging tests, such as an X-ray, MRI, CT scan, or ultrasound. ?Joint aspiration. In this test, fluid is removed from the knee and evaluated. ?Arthroscopy. In this test, a lighted tube is inserted into the knee and an image is projected onto a TV screen. ?Biopsy. In this test, a sample of tissue is removed from the body and studied under a microscope. ?Follow these instructions at home: ?If you have a knee sleeve or brace: ? ?Wear the knee sleeve or brace as told by your health care provider. Remove it only as told by your health care provider. ?Loosen it if your toes tingle, become numb, or turn cold and blue. ?Keep it clean. ?If the knee sleeve or brace is not waterproof: ?Do not let it get wet. ?Cover it with a watertight covering when you take a bath or shower. ?Activity ?Rest your knee. ?Do not do things that cause pain or make  pain worse. ?Avoid high-impact activities or exercises, such as running, jumping rope, or doing jumping jacks. ?Work with a physical therapist to make a safe exercise program, as recommended by your health care provider. Do exercises as told by your physical therapist. ?Managing pain, stiffness, and swelling ? ?If directed, put ice on the affected knee. To do this: ?If you have a removable knee sleeve or brace, remove it as told by your health care provider. ?Put ice in a plastic bag. ?Place a towel between your skin and the bag. ?Leave the ice on for 20 minutes, 2-3 times a day. ?Remove the ice if your skin turns bright red. This is very important. If you cannot feel pain, heat, or cold, you have a greater risk of damage to the area. ?If directed, use an elastic bandage to put pressure (compression) on your injured knee. This may control swelling, give support, and help with discomfort. ?Raise (elevate) your knee above the level of your heart while you are sitting or lying down. ?Sleep with a pillow under your knee. ?General instructions ?Take over-the-counter and prescription medicines only as told by your health care provider. ?Do not use any products that contain nicotine or tobacco, such as cigarettes, e-cigarettes, and chewing tobacco. If you need help quitting, ask your health care provider. ?If you are overweight, work with your health care provider and a dietitian to set  a weight-loss goal that is healthy and reasonable for you. Extra weight can put pressure on your knee. ?Pay attention to any changes in your symptoms. ?Keep all follow-up visits. This is important. ?Contact a health care provider if: ?Your knee pain continues, changes, or gets worse. ?You have a fever along with knee pain. ?Your knee feels warm to the touch or is red. ?Your knee buckles or locks up. ?Get help right away if: ?Your knee swells, and the swelling becomes worse. ?You cannot move your knee. ?You have severe pain in your knee that  cannot be managed with pain medicine. ?Summary ?Acute knee pain can be caused by a fall, an injury, an infection, or damage, swelling, or irritation of the tissues that support your knee. ?Your health care provider may perform tests to find out the cause of the pain. ?Pay attention to any changes in your symptoms. Relieve your pain with rest, medicines, light activity, and the use of ice. ?Get help right away if your knee swells, you cannot move your knee, or you have severe pain that cannot be managed with medicine. ?This information is not intended to replace advice given to you by your health care provider. Make sure you discuss any questions you have with your health care provider. ?Document Revised: 01/08/2020 Document Reviewed: 01/08/2020 ?Elsevier Patient Education ? [place]. ? ? ?

## 2021-12-23 ENCOUNTER — Ambulatory Visit (INDEPENDENT_AMBULATORY_CARE_PROVIDER_SITE_OTHER)
Admission: RE | Admit: 2021-12-23 | Discharge: 2021-12-23 | Disposition: A | Discharge status: Home / Self Care | Payer: 59 | Source: Ambulatory Visit | Attending: Family Medicine | Admitting: Family Medicine

## 2021-12-23 DIAGNOSIS — M25562 Pain in left knee: Secondary | ICD-10-CM

## 2021-12-23 DIAGNOSIS — R0789 Other chest pain: Secondary | ICD-10-CM | POA: Diagnosis not present

## 2021-12-23 DIAGNOSIS — R059 Cough, unspecified: Secondary | ICD-10-CM

## 2022-01-13 ENCOUNTER — Telehealth: Payer: Self-pay

## 2022-01-13 ENCOUNTER — Ambulatory Visit: Payer: 59

## 2022-01-13 NOTE — Telephone Encounter (Signed)
Called patient x 3 on all available phone numbers .  No answer, patient may schedule for the next available appointment.  L.Abbygale Lapid,LPN

## 2022-01-24 NOTE — Telephone Encounter (Signed)
Left message for patient to call back and schedule Medicare Annual Wellness Visit (AWV).   Please offer to do virtually or by telephone.  Left office number and my jabber [phone].  Last AWV:04/30/2020  Please schedule at anytime with Nurse Health Advisor.

## 2022-01-31 NOTE — Telephone Encounter (Signed)
I spoke to patient and she rescheduled AWV to 02/03/22.

## 2022-02-04 IMAGING — MG DIGITAL SCREENING BILAT W/ TOMO W/ CAD
8 series · 8 of 24 positions shown · non-contrast
Comparison: Previous exam(s).

CLINICAL DATA: Screening.

EXAM:
DIGITAL SCREENING BILATERAL MAMMOGRAM WITH TOMO AND CAD

[L CC synth-2D]
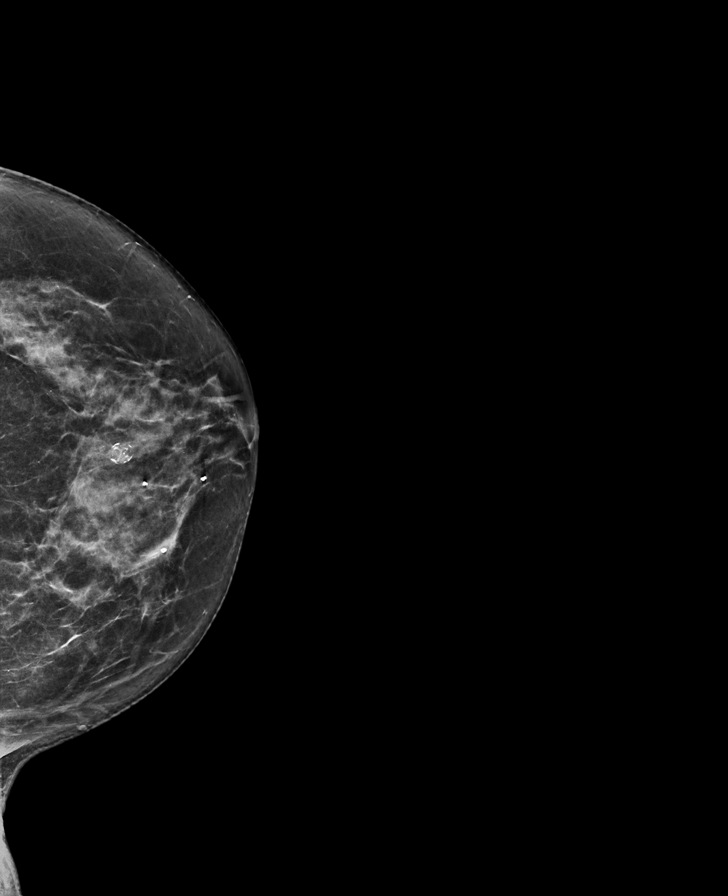

[L MLO synth-2D]
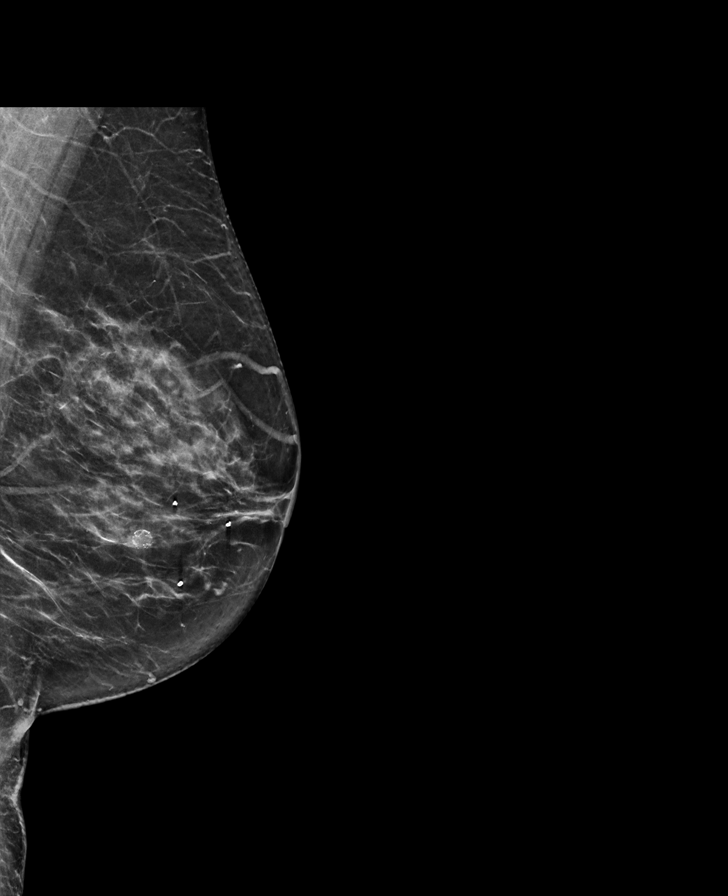

[R MLO synth-2D]
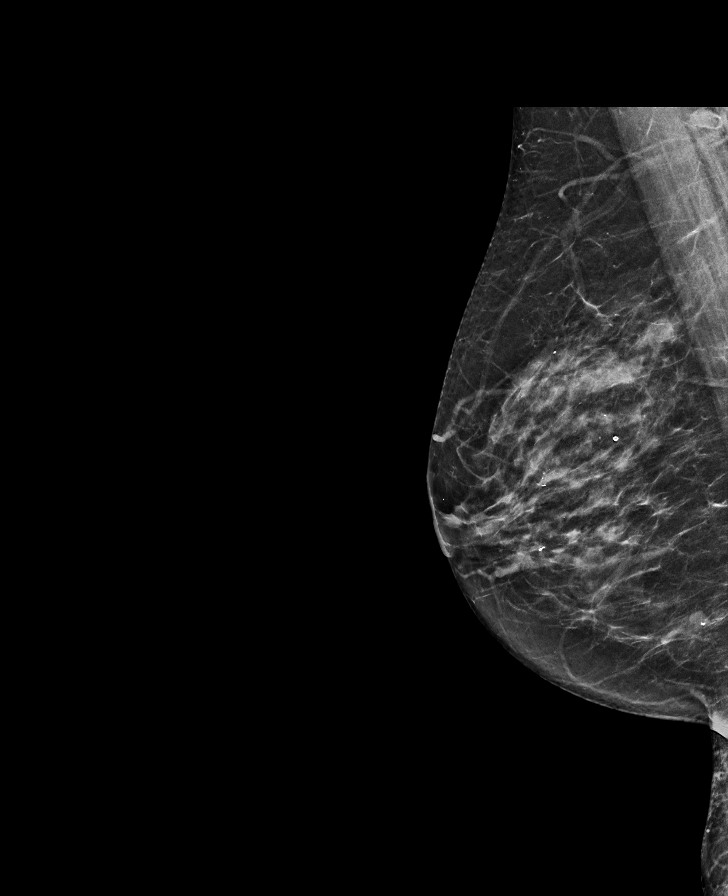

[R CC synth-2D]
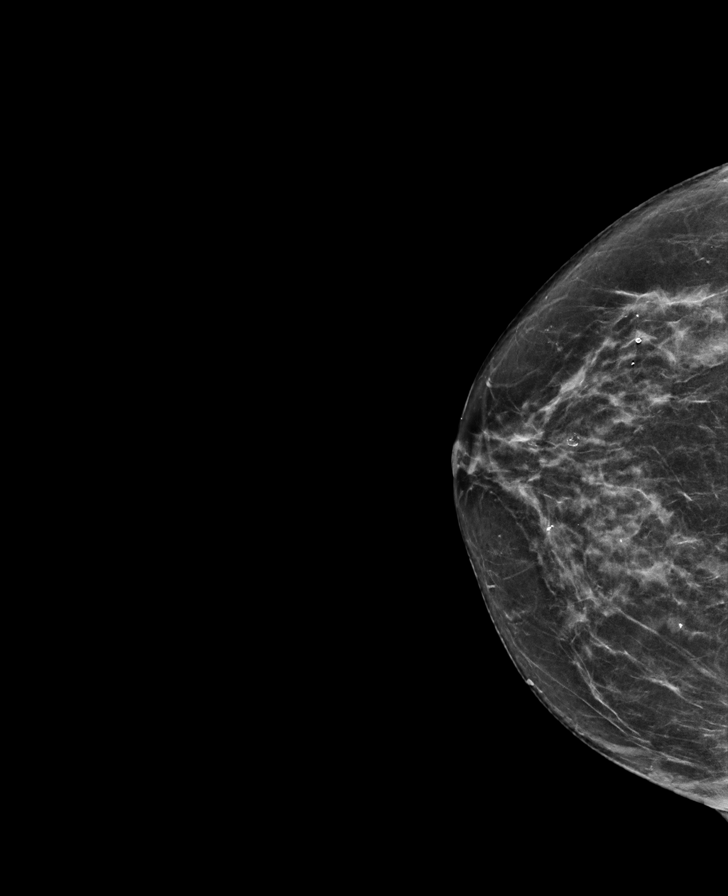

[R MLO tomo · tomo slice 33/65.0]
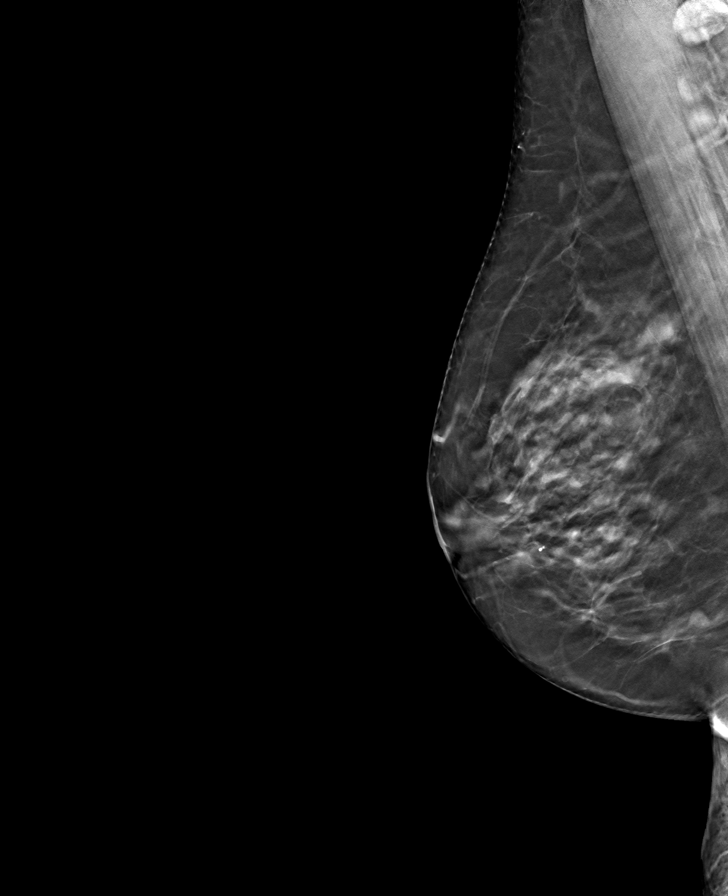

[R CC tomo · tomo slice 33/66.0]
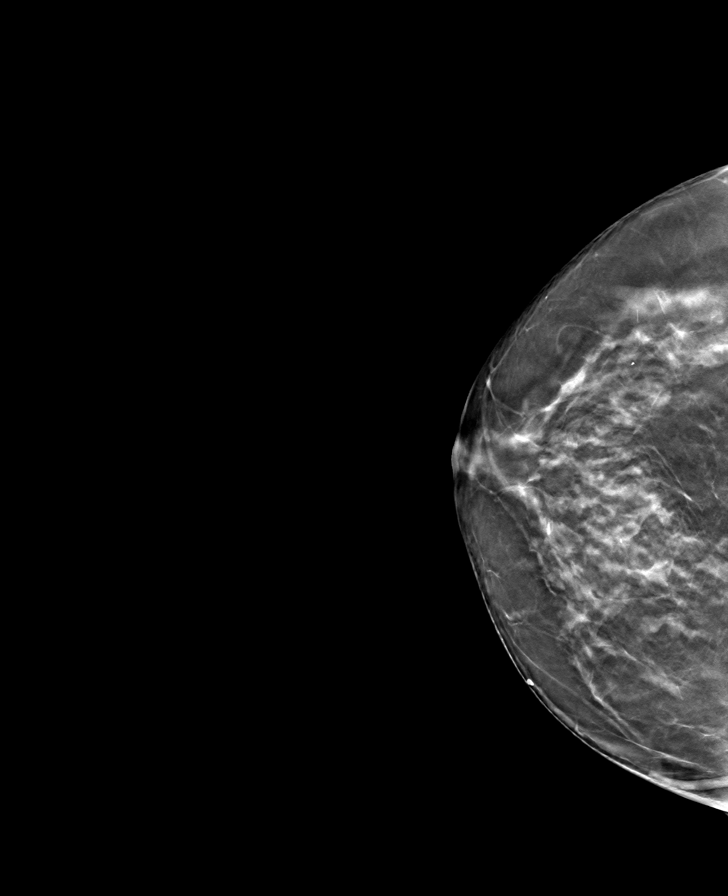

[L CC tomo · tomo slice 33/64.0]
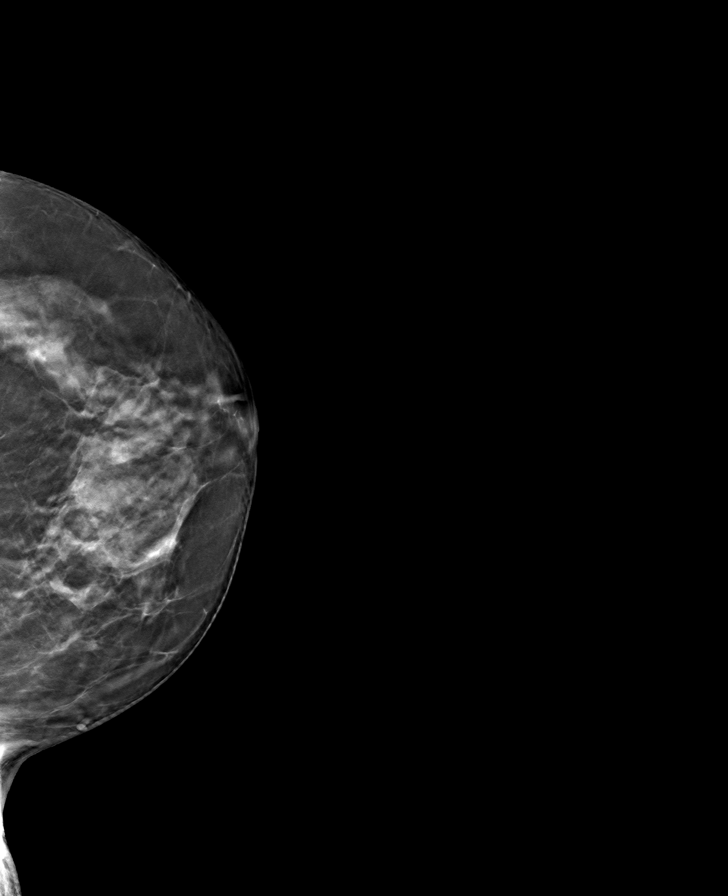

[L MLO tomo · tomo slice 33/65.0]
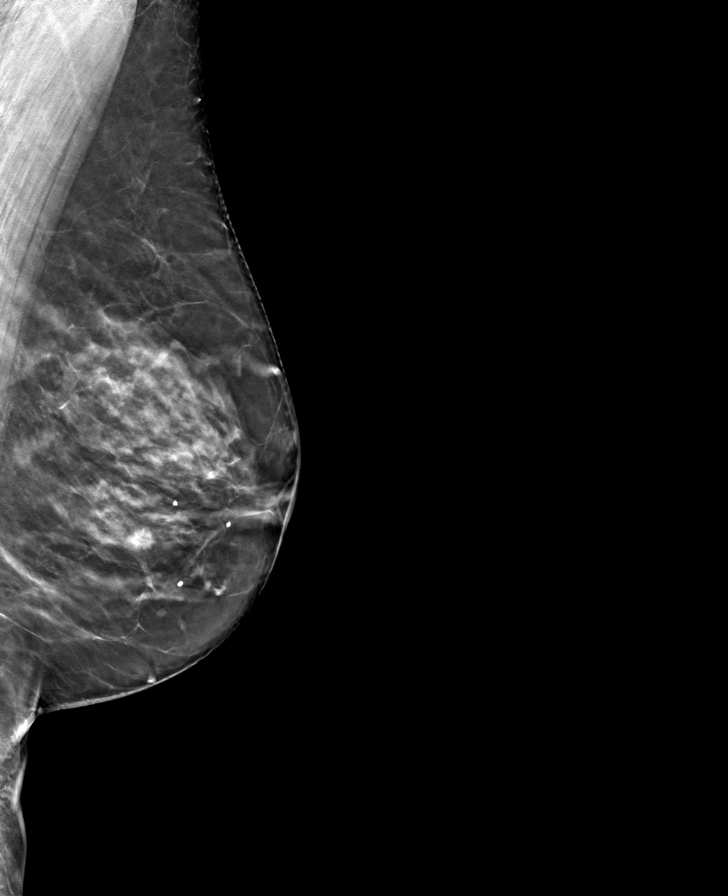

[8 of 24 positions shown; findings below may reference images not displayed]

ACR Breast Density Category c: The breast tissue is heterogeneously
dense, which may obscure small masses.
FINDINGS: There are no findings suspicious for malignancy. Images were
processed with CAD.
IMPRESSION: No mammographic evidence of malignancy. A result letter of this
screening mammogram will be mailed directly to the patient.

RECOMMENDATION:
Screening mammogram in one year. (Code:[id number])

BI-RADS CATEGORY  1: Negative.

## 2022-02-17 ENCOUNTER — Ambulatory Visit (INDEPENDENT_AMBULATORY_CARE_PROVIDER_SITE_OTHER): Payer: 59

## 2022-02-17 ENCOUNTER — Other Ambulatory Visit: Payer: Self-pay | Admitting: Family Medicine

## 2022-02-17 DIAGNOSIS — Z0000 Encounter for general adult medical examination without abnormal findings: Secondary | ICD-10-CM

## 2022-02-17 DIAGNOSIS — Z1211 Encounter for screening for malignant neoplasm of colon: Secondary | ICD-10-CM

## 2022-02-17 DIAGNOSIS — E785 Hyperlipidemia, unspecified: Secondary | ICD-10-CM

## 2022-02-17 NOTE — Patient Instructions (Signed)
Vicki [name] , Thank you for taking time to come for your Medicare Wellness Visit. I appreciate your ongoing commitment to your health goals. Please review the following plan we discussed and let me know if I can assist you in the future.   Screening recommendations/referrals: Colonoscopy: referral 02/17/2022 Mammogram: 05/04/2021 Bone Density: not of age  Recommended yearly ophthalmology/optometry visit for glaucoma screening and checkup Recommended yearly dental visit for hygiene and checkup  Vaccinations: Influenza vaccine: declined  Pneumococcal vaccine: due  Tdap vaccine: due  Shingles vaccine: will consider     Advanced directives: none   Conditions/risks identified: none   Next appointment: none   Preventive Care 40-64 Years, Female Preventive care refers to lifestyle choices and visits with your health care provider that can promote health and wellness. What does preventive care include? A yearly physical exam. This is also called an annual well check. Dental exams once or twice a year. Routine eye exams. Ask your health care provider how often you should have your eyes checked. Personal lifestyle choices, including: Daily care of your teeth and gums. Regular physical activity. Eating a healthy diet. Avoiding tobacco and drug use. Limiting alcohol use. Practicing safe sex. Taking low-dose aspirin daily starting at age 18. Taking vitamin and mineral supplements as recommended by your health care provider. What happens during an annual well check? The services and screenings done by your health care provider during your annual well check will depend on your age, overall health, lifestyle risk factors, and family history of disease. Counseling  Your health care provider may ask you questions about your: Alcohol use. Tobacco use. Drug use. Emotional well-being. Home and relationship well-being. Sexual activity. Eating habits. Work and work Statistician. Method of  birth control. Menstrual cycle. Pregnancy history. Screening  You may have the following tests or measurements: Height, weight, and BMI. Blood pressure. Lipid and cholesterol levels. These may be checked every 5 years, or more frequently if you are over 79 years old. Skin check. Lung cancer screening. You may have this screening every year starting at age 72 if you have a 30-pack-year history of smoking and currently smoke or have quit within the past 15 years. Fecal occult blood test (FOBT) of the stool. You may have this test every year starting at age 6. Flexible sigmoidoscopy or colonoscopy. You may have a sigmoidoscopy every 5 years or a colonoscopy every 10 years starting at age 33. Hepatitis C blood test. Hepatitis B blood test. Sexually transmitted disease (STD) testing. Diabetes screening. This is done by checking your blood sugar (glucose) after you have not eaten for a while (fasting). You may have this done every 1-3 years. Mammogram. This may be done every 1-2 years. Talk to your health care provider about when you should start having regular mammograms. This may depend on whether you have a family history of breast cancer. BRCA-related cancer screening. This may be done if you have a family history of breast, ovarian, tubal, or peritoneal cancers. Pelvic exam and Pap test. This may be done every 3 years starting at age 59. Starting at age 77, this may be done every 5 years if you have a Pap test in combination with an HPV test. Bone density scan. This is done to screen for osteoporosis. You may have this scan if you are at high risk for osteoporosis. Discuss your test results, treatment options, and if necessary, the need for more tests with your health care provider. Vaccines  Your health care provider may  recommend certain vaccines, such as: Influenza vaccine. This is recommended every year. Tetanus, diphtheria, and acellular pertussis (Tdap, Td) vaccine. You may need a Td  booster every 10 years. Zoster vaccine. You may need this after age 88. Pneumococcal 13-valent conjugate (PCV13) vaccine. You may need this if you have certain conditions and were not previously vaccinated. Pneumococcal polysaccharide (PPSV23) vaccine. You may need one or two doses if you smoke cigarettes or if you have certain conditions. Talk to your health care provider about which screenings and vaccines you need and how often you need them. This information is not intended to replace advice given to you by your health care provider. Make sure you discuss any questions you have with your health care provider. Document Released: 08/21/2015 Document Revised: 04/13/2016 Document Reviewed: 05/26/2015 Elsevier Interactive Patient Education  2017 [place] Prevention in the Home Falls can cause injuries. They can happen to people of all ages. There are many things you can do to make your home safe and to help prevent falls. What can I do on the outside of my home? Regularly fix the edges of walkways and driveways and fix any cracks. Remove anything that might make you trip as you walk through a door, such as a raised step or threshold. Trim any bushes or trees on the path to your home. Use bright outdoor lighting. Clear any walking paths of anything that might make someone trip, such as rocks or tools. Regularly check to see if handrails are loose or broken. Make sure that both sides of any steps have handrails. Any raised decks and porches should have guardrails on the edges. Have any leaves, snow, or ice cleared regularly. Use sand or salt on walking paths during winter. Clean up any spills in your garage right away. This includes oil or grease spills. What can I do in the bathroom? Use night lights. Install grab bars by the toilet and in the tub and shower. Do not use towel bars as grab bars. Use non-skid mats or decals in the tub or shower. If you need to sit down in the  shower, use a plastic, non-slip stool. Keep the floor dry. Clean up any water that spills on the floor as soon as it happens. Remove soap buildup in the tub or shower regularly. Attach bath mats securely with double-sided non-slip rug tape. Do not have throw rugs and other things on the floor that can make you trip. What can I do in the bedroom? Use night lights. Make sure that you have a light by your bed that is easy to reach. Do not use any sheets or blankets that are too big for your bed. They should not hang down onto the floor. Have a firm chair that has side arms. You can use this for support while you get dressed. Do not have throw rugs and other things on the floor that can make you trip. What can I do in the kitchen? Clean up any spills right away. Avoid walking on wet floors. Keep items that you use a lot in easy-to-reach places. If you need to reach something above you, use a strong step stool that has a grab bar. Keep electrical cords out of the way. Do not use floor polish or wax that makes floors slippery. If you must use wax, use non-skid floor wax. Do not have throw rugs and other things on the floor that can make you trip. What can I do with my  stairs? Do not leave any items on the stairs. Make sure that there are handrails on both sides of the stairs and use them. Fix handrails that are broken or loose. Make sure that handrails are as long as the stairways. Check any carpeting to make sure that it is firmly attached to the stairs. Fix any carpet that is loose or worn. Avoid having throw rugs at the top or bottom of the stairs. If you do have throw rugs, attach them to the floor with carpet tape. Make sure that you have a light switch at the top of the stairs and the bottom of the stairs. If you do not have them, ask someone to add them for you. What else can I do to help prevent falls? Wear shoes that: Do not have high heels. Have rubber bottoms. Are comfortable and  fit you well. Are closed at the toe. Do not wear sandals. If you use a stepladder: Make sure that it is fully opened. Do not climb a closed stepladder. Make sure that both sides of the stepladder are locked into place. Ask someone to hold it for you, if possible. Clearly mark and make sure that you can see: Any grab bars or handrails. First and last steps. Where the edge of each step is. Use tools that help you move around (mobility aids) if they are needed. These include: Canes. Walkers. Scooters. Crutches. Turn on the lights when you go into a dark area. Replace any light bulbs as soon as they burn out. Set up your furniture so you have a clear path. Avoid moving your furniture around. If any of your floors are uneven, fix them. If there are any pets around you, be aware of where they are. Review your medicines with your doctor. Some medicines can make you feel dizzy. This can increase your chance of falling. Ask your doctor what other things that you can do to help prevent falls. This information is not intended to replace advice given to you by your health care provider. Make sure you discuss any questions you have with your health care provider. Document Released: 05/21/2009 Document Revised: 12/31/2015 Document Reviewed: 08/29/2014 Elsevier Interactive Patient Education  2017 [name].

## 2022-02-17 NOTE — Progress Notes (Signed)
Subjective:   Vicki [name] is a 61 y.o. female who presents for Medicare Annual (Subsequent) preventive examination.   I connected with Georgina Pillion today by telephone and verified that I am speaking with the correct person using two identifiers. Location patient: home Location provider: work Persons participating in the virtual visit: patient, provider.   I discussed the limitations, risks, security and privacy concerns of performing an evaluation and management service by telephone and the availability of in person appointments. I also discussed with the patient that there may be a patient responsible charge related to this service. The patient expressed understanding and verbally consented to this telephonic visit.    Interactive audio and video telecommunications were attempted between this provider and patient, however failed, due to patient having technical difficulties OR patient did not have access to video capability.  We continued and completed visit with audio only.    Review of Systems     Cardiac Risk Factors include: advanced age (>12mn, >>6women);diabetes mellitus     Objective:    Today's Vitals   There is no height or weight on file to calculate BMI.     02/17/2022    3:16 PM 12/16/2020   10:02 AM 04/30/2020   11:05 AM 09/26/2018    8:28 AM 09/25/2018    9:54 PM 01/19/2017   11:18 AM 02/05/2016    1:33 PM  Advanced Directives  Does Patient Have a Medical Advance Directive? No No No No No No No  Would patient like information on creating a medical advance directive? No - Patient declined  Yes (ED - Information included in AVS) No - Patient declined No - Patient declined No - Patient declined No - patient declined information    Current Medications (verified) Outpatient Encounter Medications as of 02/17/2022  Medication Sig   ASPIRIN 81 PO Take 81 mg by mouth daily.   atorvastatin (LIPITOR) 10 MG tablet Take 1 tablet (10 mg total) by mouth daily.    budesonide-formoterol (SYMBICORT) 160-4.5 MCG/ACT inhaler Inhale 2 puffs into the lungs 2 (two) times daily.   Calcium Carb-Cholecalciferol (CALCIUM 600/VITAMIN D3 PO) Take 1 tablet by mouth daily.   doxycycline (VIBRA-TABS) 100 MG tablet Take 1 tablet (100 mg total) by mouth 2 (two) times daily.   escitalopram (LEXAPRO) 20 MG tablet Take 1 tablet (20 mg total) by mouth at bedtime.   fluticasone (FLONASE) 50 MCG/ACT nasal spray Place 2 sprays into both nostrils daily.   hydrochlorothiazide (MICROZIDE) 12.5 MG capsule TAKE 1 CAPSULE (12.5 MG TOTAL) BY MOUTH DAILY.   hydrOXYzine (ATARAX) 10 MG tablet Take 0.5-1 tablets (5-10 mg total) by mouth 3 (three) times daily as needed.   hydrOXYzine (ATARAX) 25 MG tablet TAKE 1/2 TO 1 TABLETS BY MOUTH EVERY 8 (EIGHT) HOURS AS NEEDED FOR ANXIETY.   lisinopril (ZESTRIL) 20 MG tablet Take 20 mg by mouth daily.   meloxicam (MOBIC) 7.5 MG tablet TAKE 1 TABLET (7.5 MG TOTAL) BY MOUTH AT BEDTIME AS NEEDED FOR PAIN.   metFORMIN (GLUCOPHAGE) 500 MG tablet Take 1 tablet (500 mg total) by mouth daily with breakfast.   phenazopyridine (PYRIDIUM) 200 MG tablet Take 1 tablet (200 mg total) by mouth 3 (three) times daily as needed for pain.   predniSONE (DELTASONE) 20 MG tablet Take 2 tablets (40 mg total) by mouth daily with breakfast.   vitamin E 180 MG (400 UNITS) capsule Take 400 Units by mouth daily.   Facility-Administered Encounter Medications as of 02/17/2022  Medication  gemcitabine (GEMZAR) chemo syringe for bladder instillation 2,000 mg    Allergies (verified) Codeine   History: Past Medical History:  Diagnosis Date   Acquired hallux valgus of left foot    Bladder tumor    Callous ulcer ([place])    Depression with anxiety    DEPRESSIVE DISORDER NOT ELSEWHERE CLASSIFIED    Diabetes mellitus without complication ([place])    Phreesia 10/13/2020   Dyspnea    Foot deformity, acquired    Grief reaction    History of kidney stones    Hyperlipidemia     [employer]reesia 10/13/2020   Hypertension    [employer]reesia 10/13/2020   Myalgia and myositis, unspecified    Onychomycosis    Oral thrush    Pneumonia    4 months ago   Pre-diabetes    Rotator cuff syndrome of left shoulder    Rotator cuff tear, right    Shortness of breath    SHOULDER PAIN, RIGHT    Sleep apnea    Phreesia 10/13/2020   Sleep disorder    Urinary hesitancy    Past Surgical History:  Procedure Laterality Date   cryotherphy  1986   TRANSURETHRAL RESECTION OF BLADDER TUMOR N/A 12/21/2020   Procedure: TRANSURETHRAL RESECTION OF BLADDER TUMOR (TURBT)WITH CYSTOSTOPY/ BILATERAL RETROGRADE PYELOGRAM WITH LEFT STENT PLACEMENT;  Surgeon: Raynelle Bring, MD;  Location: [facility];  Service: Urology;  Laterality: N/A;  GENERAL ANESTHESIA WITHH PARALYSIS   TUBAL LIGATION     Family History  Problem Relation Age of Onset   Heart disease Mother    Diabetes Mother    Cancer Mother        breast and ovarian   Stroke Mother    Breast cancer Mother        diagnosed in her 59's   Heart disease Father    Social History   Socioeconomic History   Marital status: Widowed    Spouse name: Not on file   Number of children: Not on file   Years of education: Not on file   Highest education level: Not on file  Occupational History   Not on file  Tobacco Use   Smoking status: Every Day    Packs/day: 0.50    Types: Cigarettes   Smokeless tobacco: Never   Tobacco comments:    increased smoking again due to stress  Vaping Use   Vaping Use: Never used  Substance and Sexual Activity   Alcohol use: No   Drug use: No   Sexual activity: Never    Birth control/protection: Surgical, Post-menopausal  Other Topics Concern   Not on file  Social History Narrative   Not on file   Social Determinants of Health   Financial Resource Strain: Low Risk  (02/17/2022)   Overall Financial Resource Strain (CARDIA)    Difficulty of Paying Living Expenses: Not hard at all  Food Insecurity: No Food  Insecurity (02/17/2022)   Hunger Vital Sign    Worried About Running Out of Food in the Last Year: Never true    Ran Out of Food in the Last Year: Never true  Transportation Needs: No Transportation Needs (02/17/2022)   PRAPARE - Hydrologist (Medical): No    Lack of Transportation (Non-Medical): No  Physical Activity: Sufficiently Active (02/17/2022)   Exercise Vital Sign    Days of Exercise per Week: 3 days    Minutes of Exercise per Session: 60 min  Stress: No Stress Concern Present (02/17/2022)  [employer] - Occupational Stress Questionnaire    Feeling of Stress : Not at all  Social Connections: Moderately Isolated (02/17/2022)   Social Connection and Isolation Panel [NHANES]    Frequency of Communication with Friends and Family: Three times a week    Frequency of Social Gatherings with Friends and Family: Three times a week    Attends Religious Services: More than 4 times per year    Active Member of Clubs or Organizations: No    Attends Archivist Meetings: Never    Marital Status: Widowed    Tobacco Counseling Ready to quit: Not Answered Counseling given: Not Answered Tobacco comments: increased smoking again due to stress   Clinical Intake:  Pre-visit preparation completed: Yes  Pain : No/denies pain     Nutritional Risks: None Diabetes: Yes CBG done?: No Did pt. bring in CBG monitor from home?: No  How often do you need to have someone help you when you read instructions, pamphlets, or other written materials from your doctor or pharmacy?: 1 - Never What is the last grade level you completed in school?: 9 th grade  Diabetic?yes  Nutrition Risk Assessment:  Has the patient had any N/V/D within the last 2 months?  No  Does the patient have any non-healing wounds?  No  Has the patient had any unintentional weight loss or weight gain?  No   Diabetes:  Is the patient diabetic?  Yes  If  diabetic, was a CBG obtained today?  No  Did the patient bring in their glucometer from home?  No  How often do you monitor your CBG's? Daily .   Financial Strains and Diabetes Management:  Are you having any financial strains with the device, your supplies or your medication? No .  Does the patient want to be seen by Chronic Care Management for management of their diabetes?  No  Would the patient like to be referred to a Nutritionist or for Diabetic Management?  No   Diabetic Exams:  Diabetic Eye Exam: Completed 01/2022 Diabetic Foot Exam: Overdue, Pt has been advised about the importance in completing this exam. Pt is scheduled for diabetic foot exam on next office visit .   Interpreter Needed?: No  Information entered by :: [email or web address],NTI   Activities of Daily Living    02/17/2022    3:21 PM  In your present state of health, do you have any difficulty performing the following activities:  Hearing? 0  Vision? 0  Difficulty concentrating or making decisions? 0  Walking or climbing stairs? 0  Dressing or bathing? 0  Doing errands, shopping? 0  Preparing Food and eating ? N  Using the Toilet? N  In the past six months, have you accidently leaked urine? N  Do you have problems with loss of bowel control? N  Managing your Medications? N  Managing your Finances? N  Housekeeping or managing your Housekeeping? N    Patient Care Team: Wendie Agreste, MD as PCP - General (Family Medicine)  Indicate any recent Medical Services you may have received from other than [facility] providers in the past year (date may be approximate).     Assessment:   This is a routine wellness examination for Chery.  Hearing/Vision screen Vision Screening - Comments:: Annual eye exams wears glasses   Dietary issues and exercise activities discussed: Current Exercise Habits: Home exercise routine, Type of exercise: walking;strength training/weights, Time (Minutes): 30, Frequency (Times/Week): 3,  Weekly Exercise (Minutes/Week): 90,  Intensity: Mild, Exercise limited by: None identified   Goals Addressed   None    Depression Screen    02/17/2022    3:17 PM 02/17/2022    3:15 PM 12/22/2021    1:52 PM 11/04/2021   10:31 AM 07/22/2021    1:25 PM 04/22/2021    2:50 PM 02/15/2021   11:00 AM  PHQ 2/9 Scores  PHQ - 2 Score 0 0 0 '4 5 1 1  '$ PHQ- 9 Score   0 '14 10 4 9    '$ Fall Risk    02/17/2022    3:17 PM 12/22/2021    1:52 PM 11/04/2021   10:32 AM 07/22/2021    1:25 PM 04/22/2021    2:50 PM  Fall Risk   Falls in the past year? 0 1 1 0 0  Number falls in past yr: 0 0 0 0 0  Injury with Fall? 0 0 1 0 0  Risk for fall due to :  History of fall(s) History of fall(s) No Fall Risks No Fall Risks  Follow up Falls evaluation completed;Education provided Falls evaluation completed  Falls evaluation completed Falls evaluation completed    Bent:  Any stairs in or around the home? Yes  If so, are there any without handrails? No  Home free of loose throw rugs in walkways, pet beds, electrical cords, etc? Yes  Adequate lighting in your home to reduce risk of falls? Yes   ASSISTIVE DEVICES UTILIZED TO PREVENT FALLS:  Life alert? No  Use of a cane, walker or w/c? No  Grab bars in the bathroom? No  Shower chair or bench in shower? Yes  Elevated toilet seat or a handicapped toilet? Yes   Cognitive Function:  Normal cognitive status assessed by telephone conversation  by this Nurse Health Advisor. No abnormalities found.        04/30/2020   11:01 AM  6CIT Screen  What Year? 0 points  What month? 0 points  What time? 0 points  Count back from 20 0 points  Months in reverse 0 points  Repeat phrase 0 points  Total Score 0 points    Immunizations Immunization History  Administered Date(s) Administered   Influenza Split 05/29/2012   Influenza Whole 06/11/2010   PFIZER(Purple Top)SARS-COV-2 Vaccination 04/14/2020, 05/05/2020    TDAP status:  Due, Education has been provided regarding the importance of this vaccine. Advised may receive this vaccine at local pharmacy or Health Dept. Aware to provide a copy of the vaccination record if obtained from local pharmacy or Health Dept. Verbalized acceptance and understanding.  Flu Vaccine status: Declined, Education has been provided regarding the importance of this vaccine but patient still declined. Advised may receive this vaccine at local pharmacy or Health Dept. Aware to provide a copy of the vaccination record if obtained from local pharmacy or Health Dept. Verbalized acceptance and understanding.  Pneumococcal vaccine status: Due, Education has been provided regarding the importance of this vaccine. Advised may receive this vaccine at local pharmacy or Health Dept. Aware to provide a copy of the vaccination record if obtained from local pharmacy or Health Dept. Verbalized acceptance and understanding.  Covid-19 vaccine status: Completed vaccines  Qualifies for Shingles Vaccine? Yes   Zostavax completed No   Shingrix Completed?: No.    Education has been provided regarding the importance of this vaccine. Patient has been advised to call insurance company to determine out of pocket expense if they have not yet  received this vaccine. Advised may also receive vaccine at local pharmacy or Health Dept. Verbalized acceptance and understanding.  Screening Tests Health Maintenance  Topic Date Due   Zoster Vaccines- Shingrix (1 of 2) Never done   COLONOSCOPY (Pts 45-15yr Insurance coverage will need to be confirmed)  Never done   COVID-19 Vaccine (3 - Pfizer risk series) 06/02/2020   PAP SMEAR-Modifier  07/19/2021   MAMMOGRAM  11/05/2022 (Originally 10/01/2021)   Hepatitis C Screening  11/05/2022 (Originally 11/26/1978)   TETANUS/TDAP  12/23/2022 (Originally [birth year]27/1979)   INFLUENZA VACCINE  03/08/2022   HIV Screening  Completed   HPV VACCINES  Aged Out    Health Maintenance  Health  Maintenance Due  Topic Date Due   Zoster Vaccines- Shingrix (1 of 2) Never done   COLONOSCOPY (Pts 45-440yrInsurance coverage will need to be confirmed)  Never done   COVID-19 Vaccine (3 - Pfizer risk series) 06/02/2020   PAP SMEAR-Modifier  07/19/2021    Colorectal cancer screening: Referral to GI placed 02/17/2022. Pt aware the office will call re: appt.  Mammogram status: Completed 03/31/2021. Repeat every year  Bone Density status: Ordered not of age . Pt provided with contact info and advised to call to schedule appt.  Lung Cancer Screening: (Low Dose CT Chest recommended if Age 62-80ears, 30 pack-year currently smoking OR have quit w/in 15years.) does qualify.   Lung Cancer Screening Referral: per patient by lung doctor   Additional Screening:  Hepatitis C Screening: does not qualify;   Vision Screening: Recommended annual ophthalmology exams for early detection of glaucoma and other disorders of the eye. Is the patient up to date with their annual eye exam?  Yes  Who is the provider or what is the name of the office in which the patient attends annual eye exams? [facility]  If pt is not established with a provider, would they like to be referred to a provider to establish care? No .   Dental Screening: Recommended annual dental exams for proper oral hygiene  Community Resource Referral / Chronic Care Management: CRR required this visit?  No   CCM required this visit?  No      Plan:     I have personally reviewed and noted the following in the patient's chart:   Medical and social history Use of alcohol, tobacco or illicit drugs  Current medications and supplements including opioid prescriptions.  Functional ability and status Nutritional status Physical activity Advanced directives List of other physicians Hospitalizations, surgeries, and ER visits in previous 12 months Vitals Screenings to include cognitive, depression, and falls Referrals and  appointments  In addition, I have reviewed and discussed with patient certain preventive protocols, quality metrics, and best practice recommendations. A written personalized care plan for preventive services as well as general preventive health recommendations were provided to patient.     LaRandel PiggLPN   02/13/82/3825 Nurse Notes: none

## 2022-02-24 ENCOUNTER — Other Ambulatory Visit: Payer: Self-pay | Admitting: Family Medicine

## 2022-02-24 DIAGNOSIS — F418 Other specified anxiety disorders: Secondary | ICD-10-CM

## 2022-02-24 DIAGNOSIS — I10 Essential (primary) hypertension: Secondary | ICD-10-CM

## 2022-02-24 DIAGNOSIS — R7303 Prediabetes: Secondary | ICD-10-CM

## 2022-02-24 DIAGNOSIS — J309 Allergic rhinitis, unspecified: Secondary | ICD-10-CM

## 2022-02-24 DIAGNOSIS — R059 Cough, unspecified: Secondary | ICD-10-CM

## 2022-03-17 ENCOUNTER — Ambulatory Visit (INDEPENDENT_AMBULATORY_CARE_PROVIDER_SITE_OTHER): Payer: 59 | Admitting: Family Medicine

## 2022-03-17 ENCOUNTER — Encounter: Payer: Self-pay | Admitting: Family Medicine

## 2022-03-17 VITALS — BP 128/78 | HR 62 | Temp 98.4°F | Resp 16 | Ht 64.0 in | Wt 163.2 lb

## 2022-03-17 DIAGNOSIS — J449 Chronic obstructive pulmonary disease, unspecified: Secondary | ICD-10-CM | POA: Diagnosis not present

## 2022-03-17 DIAGNOSIS — G8929 Other chronic pain: Secondary | ICD-10-CM | POA: Diagnosis not present

## 2022-03-17 DIAGNOSIS — M25561 Pain in right knee: Secondary | ICD-10-CM | POA: Diagnosis not present

## 2022-03-17 DIAGNOSIS — M546 Pain in thoracic spine: Secondary | ICD-10-CM | POA: Diagnosis not present

## 2022-03-17 MED ORDER — DICLOFENAC SODIUM 1 % EX CREA: 4.0000 g | TOPICAL_CREAM | Freq: Four times a day (QID) | CUTANEOUS | 2 refills | Status: DC | PRN

## 2022-03-17 NOTE — Progress Notes (Signed)
Subjective:  Patient ID: Vicki [name], female    DOB: 25-Jul-1961  Age: 61 y.o. MRN: [id number]  CC:  Chief Complaint  Patient presents with   Knee Pain    Chronic Lt knee pain    chest wall pain    With cough and other irritation has been intermittent issue     HPI Vicki [name] presents for   L knee pain: Discussed in May, felt like her left knee went out of place with vacuuming few days prior.  Initial swelling, and then knee appeared to go back in place after few minutes.  Some persistent swelling with pain in front of her knee to her thigh at that visit.  Symptomatic care discussed at that time, Xray May 18 without evidence of fracture, dislocation or joint effusion.  No evidence of arthropathy or other focal bony abnormality and soft tissues were unremarkable. Left knee pain improved after a few weeks.   R knee sore - chronic with flairs at times, past few years. Going to gym with various exercises. No changes with exercise. Feels like going to give way, but has not locked or given way. No assistive device.  Seen by ortho few years ago - injection - made worse. Worse pain with walking after injection. Dr. Erlinda Hong 12/2019 - xray at that time.  Tx: otc cream - unknown name. Prior meloxicam helped, but d/c by kidney doctor Wearing brace at gym. Declines ortho referral at this time.   Lab Results  Component Value Date   CREATININE 0.66 11/04/2021    Chest wall pain: Upper back pain discussed at her last visit in May.  Few weeks at that time.  Intermittent coughing fits discussed at that time along with sneezing at times for the previous 4 months.  She was taking over-the-counter allergy med with minimal relief and prior decongestant nasal spray but not recent use.  Dry cough.  Trial of Flonase recommended as possible allergic cause and with reproducible pain in the paraspinal/trapezius area, likely muscular pain from persistent cough.  Chest x-ray indicated mild pulmonary  hyperinflation but otherwise no acute cardiopulmonary process.  Used flonase nasal spray for cough - helps, but not using daily -- every other day. Dry cough, some runny nose at times. No fever. Chronic shortness of breath with COPD. Few times per week, no recent changes. No chest pain.  Sees pulmonary at [facility].  Treated with Symbicort for COPD. Albuterol once at night 1-2 times per week.  Still sore in bra strap area - notes at night. Feels like in bone area.only minimal soreness in day. In middle and some to sides at bra strap.  Intermittent feeling of soreness in area. Declines PT referral.  Yearly lung CT with [facility] for lung CA screening - due.   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   Influenza A 09/26/2018   COPD with acute exacerbation ([place]) 09/26/2018   Neck pain 02/05/2016   Leg swelling 01/19/2016   Dry cough 02/11/2015   Fatigue 02/11/2015   Prediabetes 10/29/2014   Polyuria 10/28/2014   Hyperglycemia 10/28/2014   Left breast mass 04/11/2014   Right hip pain 03/21/2014   Edema, lower extremity 03/21/2014   Low back pain 12/17/2013   Right knee pain 12/17/2013   Chest pain, central 11/05/2013   Lateral epicondylitis of left elbow 11/05/2013   Obstructive chronic bronchitis without exacerbation ([place]) 08/23/2013  Wrist fracture, right 07/08/2013   Sciatica 05/28/2013   Grief reaction 04/02/2013   Myalgia and myositis, unspecified 03/07/2012   Rotator cuff syndrome of left shoulder 03/07/2012   Depression with anxiety 03/07/2012   Foot deformity, acquired 06/21/2011   Callous ulcer ([place]) 06/21/2011   Acquired hallux valgus of left foot 05/10/2011   Sleep disorder 02/04/2011   Rotator cuff tear, right 09/03/2010   SHOULDER PAIN, RIGHT 08/20/2010   TOBACCO ABUSE 06/11/2010   DEPRESSIVE DISORDER NOT ELSEWHERE CLASSIFIED 06/11/2010   Past Medical History:  Diagnosis  Date   Acquired hallux valgus of left foot    Bladder tumor    Callous ulcer ([place])    Depression with anxiety    DEPRESSIVE DISORDER NOT ELSEWHERE CLASSIFIED    Diabetes mellitus without complication ([place])    Phreesia 10/13/2020   Dyspnea    Foot deformity, acquired    Grief reaction    History of kidney stones    Hyperlipidemia    Phreesia 10/13/2020   Hypertension    Phreesia 10/13/2020   Myalgia and myositis, unspecified    Onychomycosis    Oral thrush    Pneumonia    4 months ago   Pre-diabetes    Rotator cuff syndrome of left shoulder    Rotator cuff tear, right    Shortness of breath    SHOULDER PAIN, RIGHT    Sleep apnea    Phreesia 10/13/2020   Sleep disorder    Urinary hesitancy    Past Surgical History:  Procedure Laterality Date   cryotherphy  1986   TRANSURETHRAL RESECTION OF BLADDER TUMOR N/A 12/21/2020   Procedure: TRANSURETHRAL RESECTION OF BLADDER TUMOR (TURBT)WITH CYSTOSTOPY/ BILATERAL RETROGRADE PYELOGRAM WITH LEFT STENT PLACEMENT;  Surgeon: Raynelle Bring, MD;  Location: [facility];  Service: Urology;  Laterality: N/A;  GENERAL ANESTHESIA WITHH PARALYSIS   TUBAL LIGATION     Allergies  Allergen Reactions   Codeine Nausea And Vomiting   Prior to Admission medications   Medication Sig Start Date End Date Taking? Authorizing Provider  ASPIRIN 81 PO Take 81 mg by mouth daily.   Yes [provider]  atorvastatin (LIPITOR) 10 MG tablet take 1 tablet once daily 02/17/22  Yes Wendie Agreste, MD  budesonide-formoterol [facility]) 160-4.5 MCG/ACT inhaler Inhale 2 puffs into the lungs 2 (two) times daily.   Yes [provider]  Calcium Carb-Cholecalciferol (CALCIUM 600/VITAMIN D3 PO) Take 1 tablet by mouth daily.   Yes [provider]  doxycycline (VIBRA-TABS) 100 MG tablet Take 1 tablet (100 mg total) by mouth 2 (two) times daily. 11/04/21  Yes Wendie Agreste, MD  escitalopram (LEXAPRO) 20 MG tablet take 1 tablet once daily 02/24/22   Yes Wendie Agreste, MD  fluticasone [facility]) 50 MCG/ACT nasal spray instill 2 sprays in each nostril once daily 02/24/22  Yes Wendie Agreste, MD  hydrochlorothiazide (MICROZIDE) 12.5 MG capsule take 1 capsule once daily 02/24/22  Yes Wendie Agreste, MD  hydrOXYzine (ATARAX) 10 MG tablet Take 0.5-1 tablets (5-10 mg total) by mouth 3 (three) times daily as needed. 07/22/21  Yes Wendie Agreste, MD  hydrOXYzine (ATARAX) 25 MG tablet take 1 tablet by mouth every 8 hours as needed for anxiety 02/24/22  Yes Wendie Agreste, MD  lisinopril (ZESTRIL) 20 MG tablet Take 20 mg by mouth daily. 11/03/20  Yes [provider]  meloxicam (MOBIC) 7.5 MG tablet TAKE 1 TABLET (7.5 MG TOTAL) BY MOUTH AT BEDTIME AS NEEDED FOR PAIN.  11/03/21  Yes Wendie Agreste, MD  metFORMIN (GLUCOPHAGE) 500 MG tablet take 1 tablet once daily 02/24/22  Yes Wendie Agreste, MD  phenazopyridine (PYRIDIUM) 200 MG tablet Take 1 tablet (200 mg total) by mouth 3 (three) times daily as needed for pain. 12/21/20  Yes Raynelle Bring, MD  vitamin E 180 MG (400 UNITS) capsule Take 400 Units by mouth daily.   Yes [provider]   Social History   Socioeconomic History   Marital status: Widowed    Spouse name: Not on file   Number of children: Not on file   Years of education: Not on file   Highest education level: Not on file  Occupational History   Not on file  Tobacco Use   Smoking status: Every Day    Packs/day: 0.50    Types: Cigarettes   Smokeless tobacco: Never   Tobacco comments:    increased smoking again due to stress  Vaping Use   Vaping Use: Never used  Substance and Sexual Activity   Alcohol use: No   Drug use: No   Sexual activity: Never    Birth control/protection: Surgical, Post-menopausal  Other Topics Concern   Not on file  Social History Narrative   Not on file   Social Determinants of Health   Financial Resource Strain: Low Risk  (02/17/2022)   Overall Financial Resource  Strain (CARDIA)    Difficulty of Paying Living Expenses: Not hard at all  Food Insecurity: No Food Insecurity (02/17/2022)   Hunger Vital Sign    Worried About Running Out of Food in the Last Year: Never true    Ran Out of Food in the Last Year: Never true  Transportation Needs: No Transportation Needs (02/17/2022)   PRAPARE - Hydrologist (Medical): No    Lack of Transportation (Non-Medical): No  Physical Activity: Sufficiently Active (02/17/2022)   Exercise Vital Sign    Days of Exercise per Week: 3 days    Minutes of Exercise per Session: 60 min  Stress: No Stress Concern Present (02/17/2022)   [place]    Feeling of Stress : Not at all  Social Connections: Moderately Isolated (02/17/2022)   Social Connection and Isolation Panel [NHANES]    Frequency of Communication with Friends and Family: Three times a week    Frequency of Social Gatherings with Friends and Family: Three times a week    Attends Religious Services: More than 4 times per year    Active Member of Clubs or Organizations: No    Attends Archivist Meetings: Never    Marital Status: Widowed  Intimate Partner Violence: Not At Risk (02/17/2022)   Humiliation, Afraid, Rape, and Kick questionnaire    Fear of Current or Ex-Partner: No    Emotionally Abused: No    Physically Abused: No    Sexually Abused: No    Review of Systems Per HPI  Objective:   Vitals:   03/17/22 1419  BP: 128/78  Pulse: 62  Resp: 16  Temp: 98.4 F (36.9 C)  TempSrc: Oral  SpO2: 97%  Weight: 163 lb 3.2 oz (74 kg)  Height: '5\' 4"'$  (1.626 m)     Physical Exam Vitals reviewed.  Constitutional:      General: She is not in acute distress.    Appearance: Normal appearance. She is well-developed.  HENT:     Head: Normocephalic and atraumatic.  Cardiovascular:  Rate and Rhythm: Normal rate.  Pulmonary:     Effort: Pulmonary effort  is normal.     Comments: Few end expiratory wheezes, overall clear without distress.  Mid thoracic spine with some slight discomfort but no focal bony tenderness.  Appears to be primarily at the paraspinals mid thoracic spine.  Skin intact without erythema or mass appreciated. Musculoskeletal:     Comments: R knee no focal bony tenderness.  Minimal discomfort at distal quadriceps tendon without defect.  Minimal discomfort into her hamstrings medially with [place] but not joint line.  No appreciable effusion.  Overall intact and equal range of motion to left.  L knee No focal bony tenderness, full range of motion.  No appreciable effusion  Neurological:     Mental Status: She is alert and oriented to person, place, and time.  Psychiatric:        Mood and Affect: Mood normal.        Assessment & Plan:  Marvine Encalade is a 61 y.o. female . Chronic pain of right knee - Plan: Ambulatory referral to Physical Therapy, Diclofenac Sodium 1 % CREA  -Trial of physical therapy has deferred repeat evaluation with Ortho at this time for repeat injection.  May have other modalities available but will try PT initially, topical diclofenac but can clear that with her specialist first.  Midline thoracic back pain, unspecified chronicity - Plan: Ambulatory referral to Physical Therapy  -No sign of compression fracture or bony findings on recent chest x-ray.  No focal bony tenderness on exam.  Possible muscular pain versus impingement in thoracic spine.  Intermittent symptoms, will try physical therapy initially, consider further imaging if not improving.  RTC precautions if worse  Chronic obstructive pulmonary disease, unspecified COPD type ([place])  -Continue follow-up with pulmonary and appears she may be due for repeat low-dose CT for lung cancer screening.  This would also look at the chest wall as above.  Advise she contact her pulmonologist for this testing.   Meds ordered this encounter   Medications   Diclofenac Sodium 1 % CREA    Sig: Apply 4 g topically 4 (four) times daily as needed.    Dispense:  120 g    Refill:  2   Patient Instructions  Follow up with your lung specialist about the cough and breathing concerns. Continue symbicort.  Try taking flonase daily to see if that lessens cough further from drainage.   I am glad the left knee is better.   Can try voltaren gel for right knee pain as long as your kidney specialist is ok with this med. If not improving I recommend discussing options with orthopaedics. I will refer you to physical therapy for knee and back pain, but updated lung CT may helpful to also look at vertebrae. Call your lung specialist to schedule that study as it has been a year since last study.   Return to the clinic or go to the nearest emergency room if any of your symptoms worsen or new symptoms occur.  Please call [facility] at [phone] to schedule your colonoscopy.  It looks like they have tried to contact you by mychart a few times.     Signed,   Merri Ray, MD [place] 03/17/22 2:37 PM

## 2022-03-17 NOTE — Patient Instructions (Addendum)
Follow up with your lung specialist about the cough and breathing concerns. Continue symbicort.  Try taking flonase daily to see if that lessens cough further from drainage.   I am glad the left knee is better.   Can try voltaren gel for right knee pain as long as your kidney specialist is ok with this med. If not improving I recommend discussing options with orthopaedics. I will refer you to physical therapy for knee and back pain, but updated lung CT may helpful to also look at vertebrae. Call your lung specialist to schedule that study as it has been a year since last study.   Return to the clinic or go to the nearest emergency room if any of your symptoms worsen or new symptoms occur.  Please call [facility] at [phone] to schedule your colonoscopy.  It looks like they have tried to contact you by mychart a few times.

## 2022-03-18 ENCOUNTER — Other Ambulatory Visit: Payer: Self-pay | Admitting: Family Medicine

## 2022-03-18 DIAGNOSIS — Z1231 Encounter for screening mammogram for malignant neoplasm of breast: Secondary | ICD-10-CM

## 2022-04-04 ENCOUNTER — Ambulatory Visit: Payer: 59

## 2022-04-08 ENCOUNTER — Ambulatory Visit: Payer: 59

## 2022-04-18 ENCOUNTER — Ambulatory Visit: Payer: 59 | Admitting: Physical Therapy

## 2022-04-28 ENCOUNTER — Other Ambulatory Visit: Payer: Self-pay | Admitting: Family Medicine

## 2022-04-28 NOTE — Telephone Encounter (Signed)
Knee pain discussed August 10.  Will refill Voltaren gel.

## 2022-04-28 NOTE — Telephone Encounter (Signed)
Patient is requesting a refill of the following medications: Requested Prescriptions   Pending Prescriptions Disp Refills   diclofenac Sodium (VOLTAREN) 1 % GEL [Pharmacy Med Name: Diclofenac Sodium 1% Top Gel] 100 g 11    Sig: Apply 4gms topically four times a day as needed    Date of patient request: 04/28/22 Last office visit: 03/17/22 Date of last refill: 03/17/22 Last refill amount: 100 g

## 2022-05-18 ENCOUNTER — Ambulatory Visit (INDEPENDENT_AMBULATORY_CARE_PROVIDER_SITE_OTHER): Payer: 59 | Admitting: Family Medicine

## 2022-05-18 ENCOUNTER — Encounter: Payer: Self-pay | Admitting: Family Medicine

## 2022-05-18 VITALS — BP 138/78 | HR 91 | Temp 98.6°F | Ht 64.0 in | Wt 161.8 lb

## 2022-05-18 DIAGNOSIS — F439 Reaction to severe stress, unspecified: Secondary | ICD-10-CM

## 2022-05-18 DIAGNOSIS — R052 Subacute cough: Secondary | ICD-10-CM

## 2022-05-18 DIAGNOSIS — F418 Other specified anxiety disorders: Secondary | ICD-10-CM

## 2022-05-18 DIAGNOSIS — M546 Pain in thoracic spine: Secondary | ICD-10-CM | POA: Diagnosis not present

## 2022-05-18 DIAGNOSIS — G8929 Other chronic pain: Secondary | ICD-10-CM

## 2022-05-18 DIAGNOSIS — M25561 Pain in right knee: Secondary | ICD-10-CM

## 2022-05-18 DIAGNOSIS — R0789 Other chest pain: Secondary | ICD-10-CM

## 2022-05-18 DIAGNOSIS — R5383 Other fatigue: Secondary | ICD-10-CM

## 2022-05-18 DIAGNOSIS — R0981 Nasal congestion: Secondary | ICD-10-CM

## 2022-05-18 DIAGNOSIS — J449 Chronic obstructive pulmonary disease, unspecified: Secondary | ICD-10-CM

## 2022-05-18 MED ORDER — HYDROXYZINE HCL 10 MG PO TABS: 5.0000 mg | ORAL_TABLET | Freq: Every evening | ORAL | 0 refills | Status: DC | PRN

## 2022-05-18 MED ORDER — DOXYCYCLINE HYCLATE 100 MG PO TABS: 100.0000 mg | ORAL_TABLET | Freq: Two times a day (BID) | ORAL | 0 refills | Status: DC

## 2022-05-18 NOTE — Patient Instructions (Addendum)
For upper back pain - let me know if you would like to meet with physical therapy as we discussed previously.   Ok to continue the voltaren for knee pain. If you continue to have knee pain or any worsening - please follow up with ortho. Let me know if they need a referral.   Lexapro should not be causing sleepiness. Hydroxyzine can cause sedation. Can try lower dose of 38m - 1/2 to 1 pill at bedtime. Let me know if that helps daytime sleepiness.  As we discussed I am concerned that the untreated severe sleep apnea may be the cause of daytime sleepiness. Let me know if you change your mind about meeting with a sleep specialist.  You can stop the cholesterol pill for a week or two, but unlikely the cause of sleepiness.   Start doxycycline for cough and congestion. Follow up if not improving.  Return to the clinic or go to the nearest emergency room if any of your symptoms worsen or new symptoms occur.   Fatigue If you have fatigue, you feel tired all the time and have a lack of energy or a lack of motivation. Fatigue may make it difficult to start or complete tasks because of exhaustion. Occasional or mild fatigue is often a normal response to activity or life. However, long-term (chronic) or extreme fatigue may be a symptom of a medical condition such as: Depression. Not having enough red blood cells or hemoglobin in the blood (anemia). A problem with a small gland located in the lower front part of the neck (thyroid disorder). Rheumatologic conditions. These are problems related to the body's defense system (immune system). Infections, especially certain viral infections. Fatigue can also lead to negative health outcomes over time. Follow these instructions at home: Medicines Take over-the-counter and prescription medicines only as told by your health care provider. Take a multivitamin if told by your health care provider. Do not use herbal or dietary supplements unless they are approved  by your health care provider. Eating and drinking  Avoid heavy meals in the evening. Eat a well-balanced diet, which includes lean proteins, whole grains, plenty of fruits and vegetables, and low-fat dairy products. Avoid eating or drinking too many products with caffeine in them. Avoid alcohol. Drink enough fluid to keep your urine pale yellow. Activity  Exercise regularly, as told by your health care provider. Use or practice techniques to help you relax, such as yoga, tai chi, meditation, or massage therapy. Lifestyle Change situations that cause you stress. Try to keep your work and personal schedules in balance. Do not use recreational or illegal drugs. General instructions Monitor your fatigue for any changes. Go to bed and get up at the same time every day. Avoid fatigue by pacing yourself during the day and getting enough sleep at night. Maintain a healthy weight. Contact a health care provider if: Your fatigue does not get better. You have a fever. You suddenly lose or gain weight. You have headaches. You have trouble falling asleep or sleeping through the night. You feel angry, guilty, anxious, or sad. You have swelling in your legs or another part of your body. Get help right away if: You feel confused, feel like you might faint, or faint. Your vision is blurry or you have a severe headache. You have severe pain in your abdomen, your back, or the area between your waist and hips (pelvis). You have chest pain, shortness of breath, or an irregular or fast heartbeat. You are unable to  urinate, or you urinate less than normal. You have abnormal bleeding from the rectum, nose, lungs, nipples, or, if you are female, the vagina. You vomit blood. You have thoughts about hurting yourself or others. These symptoms may be an emergency. Get help right away. Call 911. Do not wait to see if the symptoms will go away. Do not drive yourself to the hospital. Get help right away if  you feel like you may hurt yourself or others, or have thoughts about taking your own life. Go to your nearest emergency room or: Call 911. Call the [facility] at [phone] or 988. This is open 24 hours a day. Text the Crisis Text Line at 458-494-5814. Summary If you have fatigue, you feel tired all the time and have a lack of energy or a lack of motivation. Fatigue may make it difficult to start or complete tasks because of exhaustion. Long-term (chronic) or extreme fatigue may be a symptom of a medical condition. Exercise regularly, as told by your health care provider. Change situations that cause you stress. Try to keep your work and personal schedules in balance. This information is not intended to replace advice given to you by your health care provider. Make sure you discuss any questions you have with your health care provider. Document Revised: 05/17/2021 Document Reviewed: 05/17/2021 Elsevier Patient Education  [place].

## 2022-05-18 NOTE — Progress Notes (Signed)
Subjective:  Patient ID: Vicki [name], female    DOB: 1960-09-18  Age: 61 y.o. MRN: [id number]  CC:  Chief Complaint  Patient presents with   Back Pain    Back and Knee pain F/U - pt states her back still hurts but not much, pt states the cream seems to help her knee   Depression    PHQ9 - 9   Cough    Patient states had a chest cold 3 weeks ago and has had a cough and mucous production since that time, notes a productive cough, sinus drainage -light green, and some wheezing notes has a history of seasonal allergies     HPI Vicki [name] presents for   Follow-up of chest wall and left knee pain.  Thoracic back pain: Last visit August 10.  Upper back pain had been discussed back in May.  Intermittent coughing at that time, possible muscular pain reproducible in the paraspinal/trapezius area.  Chest x-ray with pulmonary hyperinflation but no acute cardiopulmonary process.  She did have some dry cough, history of COPD.  Still sore in her bra strap area to the right but only minimal soreness in the day.  PT referral discussed and declined at that time.  Does have yearly lung CT scanning with [facility] for lung cancer screening.  Plan for pulmonary follow-up regarding her COPD and CT screening.  Physical therapy was ordered.Never went to PT. Back doing better - only sore if bending over. Declines further treatment.   Bilateral knee pain Discussed in May, x-ray May 18 without evidence of fracture dislocation or joint effusion.  Left knee had improved after a few weeks, but still with some right knee pain, chronic with flares at times when discussed at her August 10 visit.  No change with exercise.  Felt like previous injection by Ortho years ago made worse.  Meloxicam was helpful previously but discontinued by her nephrologist.  Option of topical diclofenac for August visit. R knee pain has improved with voltaren gel - nightly.   Known history of depression treated with Lexapro  54m qd, and social stressors discussed previously.  Hydroxyzine 25 nightly.  Has declined therapy previously.  Denies suicidal ideation on repeat discussion with me.  Some increased anxiety. Feeling sleepy all day.   Snoring, not well rested. Hx of severe OSA  with AHI 54/hr in 2021, not using CPAP - felt tired all day when used it, so stopped it. Had been using all night for about 6 months. At least 4hr/night.  Taking lexapro at night Refuses follow up with sleep specialist.   Thinks cholesterol pill may be causing her to be sleepy.   Unable to have colonoscopy - needed to have prior colonoscopy faxed prior to scheduling. - we are looking in to this further.   Cough: Past 2-3 weeks.  Initial chest cold, greens sputum at times. No fever, slight wheeze. Hx of tobacco use.  No fever.  Green nasal d/c. No sinus pain or HA. Feels like getting worse - more nasal d/c.  Tx: otc cough med once. Coricidin. No relief.  Has been using symbicort (hx of COPD). Wheeze is better with symbicort.       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   Influenza A 09/26/2018   COPD with acute exacerbation ([place]) 09/26/2018   Neck pain 02/05/2016   Leg swelling 01/19/2016   Dry cough 02/11/2015   Fatigue 02/11/2015   Prediabetes 10/29/2014   Polyuria 10/28/2014   Hyperglycemia 10/28/2014   Left breast mass 04/11/2014   Right hip pain 03/21/2014    Edema, lower extremity 03/21/2014   Low back pain 12/17/2013   Right knee pain 12/17/2013   Chest pain, central 11/05/2013   Lateral epicondylitis of left elbow 11/05/2013   Obstructive chronic bronchitis without exacerbation ([place]) 08/23/2013   Wrist fracture, right 07/08/2013   Sciatica 05/28/2013   Grief reaction 04/02/2013   Myalgia and myositis, unspecified 03/07/2012   Rotator cuff syndrome of left shoulder 03/07/2012   Depression with anxiety 03/07/2012   Foot deformity, acquired 06/21/2011   Callous ulcer ([place]) 06/21/2011   Acquired hallux valgus of left foot 05/10/2011   Sleep disorder 02/04/2011   Rotator cuff tear, right 09/03/2010   SHOULDER PAIN, RIGHT 08/20/2010   TOBACCO ABUSE 06/11/2010   DEPRESSIVE DISORDER NOT ELSEWHERE CLASSIFIED 06/11/2010   Past Medical History:  Diagnosis Date   Acquired hallux valgus of left foot    Bladder tumor    Callous ulcer ([place])    Depression with anxiety    DEPRESSIVE DISORDER NOT ELSEWHERE CLASSIFIED    Diabetes mellitus without complication ([place])    Phreesia 10/13/2020   Dyspnea    Foot deformity, acquired    Grief reaction    History of kidney stones    Hyperlipidemia    Phreesia 10/13/2020   Hypertension    Phreesia 10/13/2020   Myalgia and myositis, unspecified    Onychomycosis    Oral thrush    Pneumonia    4 months ago   Pre-diabetes    Rotator cuff syndrome of left shoulder    Rotator cuff tear, right    Shortness of breath    SHOULDER PAIN, RIGHT    Sleep apnea    Phreesia 10/13/2020   Sleep disorder    Urinary hesitancy    Past Surgical History:  Procedure Laterality Date   cryotherphy  1986   TRANSURETHRAL RESECTION OF BLADDER TUMOR N/A 12/21/2020   Procedure: TRANSURETHRAL RESECTION OF BLADDER TUMOR (TURBT)WITH CYSTOSTOPY/ BILATERAL RETROGRADE PYELOGRAM WITH LEFT STENT PLACEMENT;  Surgeon: Raynelle Bring, MD;  Location: [facility];  Service: Urology;  Laterality: N/A;  GENERAL ANESTHESIA WITHH PARALYSIS    TUBAL LIGATION     Allergies  Allergen Reactions   Codeine Nausea And Vomiting   Prior to Admission medications   Medication Sig Start Date End Date Taking? Authorizing Provider  ASPIRIN 81 PO Take 81 mg by mouth daily.   Yes [provider]  atorvastatin (LIPITOR) 10 MG tablet take 1 tablet once daily 02/17/22  Yes Wendie Agreste, MD  budesonide-formoterol [facility]) 160-4.5 MCG/ACT  inhaler Inhale 2 puffs into the lungs 2 (two) times daily.   Yes [provider]  Calcium Carb-Cholecalciferol (CALCIUM 600/VITAMIN D3 PO) Take 1 tablet by mouth daily.   Yes [provider]  diclofenac Sodium (VOLTAREN) 1 % GEL Apply 4gms topically four times a day as needed 04/28/22  Yes Wendie Agreste, MD  Diclofenac Sodium 1 % CREA Apply 4 g topically 4 (four) times daily as needed. 03/17/22  Yes Wendie Agreste, MD  escitalopram (LEXAPRO) 20 MG tablet take 1 tablet once daily 02/24/22  Yes Wendie Agreste, MD  fluticasone [facility]) 50 MCG/ACT nasal spray instill 2 sprays in each nostril once daily 02/24/22  Yes Wendie Agreste, MD  hydrochlorothiazide (MICROZIDE) 12.5 MG capsule take 1 capsule once daily 02/24/22  Yes Wendie Agreste, MD  lisinopril (ZESTRIL) 20 MG tablet Take 20 mg by mouth daily. 11/03/20  Yes [provider]  metFORMIN (GLUCOPHAGE) 500 MG tablet take 1 tablet once daily 02/24/22  Yes Wendie Agreste, MD  Omega-3 Fatty Acids (FISH OIL) 1000 MG CAPS Take by mouth.   Yes [provider]  vitamin E 180 MG (400 UNITS) capsule Take 400 Units by mouth daily.   Yes [provider]  doxycycline (VIBRA-TABS) 100 MG tablet Take 1 tablet (100 mg total) by mouth 2 (two) times daily. Patient not taking: Reported on 05/18/2022 11/04/21   Wendie Agreste, MD  hydrOXYzine (ATARAX) 10 MG tablet Take 0.5-1 tablets (5-10 mg total) by mouth 3 (three) times daily as needed. Patient not taking: Reported on 05/18/2022 07/22/21   Wendie Agreste,  MD  hydrOXYzine (ATARAX) 25 MG tablet take 1 tablet by mouth every 8 hours as needed for anxiety Patient not taking: Reported on 05/18/2022 02/24/22   Wendie Agreste, MD  meloxicam (MOBIC) 7.5 MG tablet TAKE 1 TABLET (7.5 MG TOTAL) BY MOUTH AT BEDTIME AS NEEDED FOR PAIN. Patient not taking: Reported on 05/18/2022 11/03/21   Wendie Agreste, MD  phenazopyridine (PYRIDIUM) 200 MG tablet Take 1 tablet (200 mg total) by mouth 3 (three) times daily as needed for pain. Patient not taking: Reported on 05/18/2022 12/21/20   Raynelle Bring, MD   Social History   Socioeconomic History   Marital status: Widowed    Spouse name: Not on file   Number of children: Not on file   Years of education: Not on file   Highest education level: Not on file  Occupational History   Not on file  Tobacco Use   Smoking status: Every Day    Packs/day: 0.50    Types: Cigarettes   Smokeless tobacco: Never   Tobacco comments:    increased smoking again due to stress  Vaping Use   Vaping Use: Never used  Substance and Sexual Activity   Alcohol use: No   Drug use: No   Sexual activity: Never    Birth control/protection: Surgical, Post-menopausal  Other Topics Concern   Not on file  Social History Narrative   Not on file   Social Determinants of Health   Financial Resource Strain: Low Risk  (02/17/2022)   Overall Financial Resource Strain (CARDIA)    Difficulty of Paying Living Expenses: Not hard at all  Food Insecurity: No Food Insecurity (02/17/2022)   Hunger Vital Sign    Worried About Running Out of Food in the Last Year: Never true    Ran Out of Food in the Last Year: Never true  Transportation Needs: No Transportation  Needs (02/17/2022)   PRAPARE - Hydrologist (Medical): No    Lack of Transportation (Non-Medical): No  Physical Activity: Sufficiently Active (02/17/2022)   Exercise Vital Sign    Days of Exercise per Week: 3 days    Minutes of Exercise per Session: 60  min  Stress: No Stress Concern Present (02/17/2022)   [place]    Feeling of Stress : Not at all  Social Connections: Moderately Isolated (02/17/2022)   Social Connection and Isolation Panel [NHANES]    Frequency of Communication with Friends and Family: Three times a week    Frequency of Social Gatherings with Friends and Family: Three times a week    Attends Religious Services: More than 4 times per year    Active Member of Clubs or Organizations: No    Attends Archivist Meetings: Never    Marital Status: Widowed  Intimate Partner Violence: Not At Risk (02/17/2022)   Humiliation, Afraid, Rape, and Kick questionnaire    Fear of Current or Ex-Partner: No    Emotionally Abused: No    Physically Abused: No    Sexually Abused: No    Review of Systems  Per HPI Objective:   Vitals:   05/18/22 1500  BP: 138/78  Pulse: 91  Temp: 98.6 F (37 C)  SpO2: 96%  Weight: 161 lb 12.8 oz (73.4 kg)  Height: _0  (1.626 m)     Physical Exam Vitals reviewed.  Constitutional:      Appearance: Normal appearance. She is well-developed.  HENT:     Head: Normocephalic and atraumatic.     Nose:     Comments: Sinuses nontender.  Eyes:     Conjunctiva/sclera: Conjunctivae normal.     Pupils: Pupils are equal, round, and reactive to light.  Neck:     Vascular: No carotid bruit.  Cardiovascular:     Rate and Rhythm: Normal rate and regular rhythm.     Heart sounds: Normal heart sounds.  Pulmonary:     Effort: Pulmonary effort is normal.     Breath sounds: Normal breath sounds.     Comments: Normal effort, faint end expiratory wheeze left base, otherwise clear.  Abdominal:     Palpations: Abdomen is soft. There is no pulsatile mass.     Tenderness: There is no abdominal tenderness.  Musculoskeletal:     Right lower leg: No edema.     Left lower leg: No edema.     Comments: No focal bony tenderness of upper  back or knees.  Skin:    General: Skin is warm and dry.  Neurological:     Mental Status: She is alert and oriented to person, place, and time.  Psychiatric:        Mood and Affect: Mood normal.        Behavior: Behavior normal.    54 minutes spent during visit, including chart review, review of multiple conditions above in addition to new symptoms of cough and congestion noted at end of visit, additional time with counseling and assimilation of information, exam, discussion of plan, and chart completion.     Assessment & Plan:  Vicki [name] is a 61 y.o. female . Chronic pain of right knee  -Persistent but improving right knee pain.  Continue Voltaren gel, option of Ortho eval if persistent or worsening.  Midline thoracic back pain, unspecified chronicity Chest wall pain  -Reports improved pain, deferred PT.  Declined further work-up or treatment at this time.  RTC precautions if worsening or persistent.  Reassuring exam.  Depression with anxiety - Plan: hydrOXYzine (ATARAX) 10 MG tablet Situational stress - Plan: hydrOXYzine (ATARAX) 10 MG tablet Fatigue, unspecified type  -Unlikely Lexapro causing fatigue/sleepiness.  We did discuss possibility of hydroxyzine with sedation, can try lower dosing of 10 mg one half time.  Untreated severe sleep apnea also likely contributing.  Declined meeting with sleep specialist or treatment of same condition at this time.  She was concerned about the statin causing sleepiness, less likely cause but she can stop that for a week or 2 and monitor for changes.  RTC precautions  Chronic obstructive pulmonary disease, unspecified COPD type ([place]) - Plan: doxycycline (VIBRA-TABS) 100 MG tablet Subacute cough - Plan: doxycycline (VIBRA-TABS) 100 MG tablet Sinus congestion - Plan: doxycycline (VIBRA-TABS) 100 MG tablet  -Sinusitis/bronchitis with history of COPD, possible flare.  Minimal wheeze.  Start doxycycline to cover for bacterial component,  continue other chronic meds with RTC/ER precautions given.  Potential side effects and risks of meds were discussed. Meds ordered this encounter  Medications   hydrOXYzine (ATARAX) 10 MG tablet    Sig: Take 0.5-1 tablets (5-10 mg total) by mouth at bedtime as needed.    Dispense:  30 tablet    Refill:  0   doxycycline (VIBRA-TABS) 100 MG tablet    Sig: Take 1 tablet (100 mg total) by mouth 2 (two) times daily.    Dispense:  20 tablet    Refill:  0   Patient Instructions  For upper back pain - let me know if you would like to meet with physical therapy as we discussed previously.   Ok to continue the voltaren for knee pain. If you continue to have knee pain or any worsening - please follow up with ortho. Let me know if they need a referral.   Lexapro should not be causing sleepiness. Hydroxyzine can cause sedation. Can try lower dose of 8m - 1/2 to 1 pill at bedtime. Let me know if that helps daytime sleepiness.  As we discussed I am concerned that the untreated severe sleep apnea may be the cause of daytime sleepiness. Let me know if you change your mind about meeting with a sleep specialist.  You can stop the cholesterol pill for a week or two, but unlikely the cause of sleepiness.   Start doxycycline for cough and congestion. Follow up if not improving.  Return to the clinic or go to the nearest emergency room if any of your symptoms worsen or new symptoms occur.   Fatigue If you have fatigue, you feel tired all the time and have a lack of energy or a lack of motivation. Fatigue may make it difficult to start or complete tasks because of exhaustion. Occasional or mild fatigue is often a normal response to activity or life. However, long-term (chronic) or extreme fatigue may be a symptom of a medical condition such as: Depression. Not having enough red blood cells or hemoglobin in the blood (anemia). A problem with a small gland located in the lower front part of the neck (thyroid  disorder). Rheumatologic conditions. These are problems related to the body's defense system (immune system). Infections, especially certain viral infections. Fatigue can also lead to negative health outcomes over time. Follow these instructions at home: Medicines Take over-the-counter and prescription medicines only as told by your health care provider. Take a multivitamin if told by your health care provider.  Do not use herbal or dietary supplements unless they are approved by your health care provider. Eating and drinking  Avoid heavy meals in the evening. Eat a well-balanced diet, which includes lean proteins, whole grains, plenty of fruits and vegetables, and low-fat dairy products. Avoid eating or drinking too many products with caffeine in them. Avoid alcohol. Drink enough fluid to keep your urine pale yellow. Activity  Exercise regularly, as told by your health care provider. Use or practice techniques to help you relax, such as yoga, tai chi, meditation, or massage therapy. Lifestyle Change situations that cause you stress. Try to keep your work and personal schedules in balance. Do not use recreational or illegal drugs. General instructions Monitor your fatigue for any changes. Go to bed and get up at the same time every day. Avoid fatigue by pacing yourself during the day and getting enough sleep at night. Maintain a healthy weight. Contact a health care provider if: Your fatigue does not get better. You have a fever. You suddenly lose or gain weight. You have headaches. You have trouble falling asleep or sleeping through the night. You feel angry, guilty, anxious, or sad. You have swelling in your legs or another part of your body. Get help right away if: You feel confused, feel like you might faint, or faint. Your vision is blurry or you have a severe headache. You have severe pain in your abdomen, your back, or the area between your waist and hips (pelvis). You  have chest pain, shortness of breath, or an irregular or fast heartbeat. You are unable to urinate, or you urinate less than normal. You have abnormal bleeding from the rectum, nose, lungs, nipples, or, if you are female, the vagina. You vomit blood. You have thoughts about hurting yourself or others. These symptoms may be an emergency. Get help right away. Call 911. Do not wait to see if the symptoms will go away. Do not drive yourself to the hospital. Get help right away if you feel like you may hurt yourself or others, or have thoughts about taking your own life. Go to your nearest emergency room or: Call 911. Call the [facility] at [phone] or 988. This is open 24 hours a day. Text the Crisis Text Line at 346-309-4251. Summary If you have fatigue, you feel tired all the time and have a lack of energy or a lack of motivation. Fatigue may make it difficult to start or complete tasks because of exhaustion. Long-term (chronic) or extreme fatigue may be a symptom of a medical condition. Exercise regularly, as told by your health care provider. Change situations that cause you stress. Try to keep your work and personal schedules in balance. This information is not intended to replace advice given to you by your health care provider. Make sure you discuss any questions you have with your health care provider. Document Revised: 05/17/2021 Document Reviewed: 05/17/2021 Elsevier Patient Education  2023 [place],   Merri Ray, MD [place] 05/18/22 4:22 PM

## 2022-05-24 ENCOUNTER — Encounter: Payer: Self-pay | Admitting: Family Medicine

## 2022-06-04 ENCOUNTER — Other Ambulatory Visit: Payer: Self-pay | Admitting: Family Medicine

## 2022-06-04 DIAGNOSIS — F439 Reaction to severe stress, unspecified: Secondary | ICD-10-CM

## 2022-06-04 DIAGNOSIS — F418 Other specified anxiety disorders: Secondary | ICD-10-CM

## 2022-06-23 ENCOUNTER — Ambulatory Visit (INDEPENDENT_AMBULATORY_CARE_PROVIDER_SITE_OTHER): Payer: 59 | Admitting: Family Medicine

## 2022-06-23 ENCOUNTER — Encounter: Payer: Self-pay | Admitting: Family Medicine

## 2022-06-23 VITALS — BP 124/76 | HR 78 | Temp 98.2°F | Ht 64.0 in | Wt 159.0 lb

## 2022-06-23 DIAGNOSIS — M21612 Bunion of left foot: Secondary | ICD-10-CM

## 2022-06-23 DIAGNOSIS — M2012 Hallux valgus (acquired), left foot: Secondary | ICD-10-CM | POA: Diagnosis not present

## 2022-06-23 DIAGNOSIS — G4733 Obstructive sleep apnea (adult) (pediatric): Secondary | ICD-10-CM | POA: Diagnosis not present

## 2022-06-23 DIAGNOSIS — R400 Somnolence: Secondary | ICD-10-CM

## 2022-06-23 NOTE — Progress Notes (Signed)
Subjective:  Patient ID: Vicki [name], female    DOB: January 19, 1961  Age: 61 y.o. MRN: [id number]  CC:  Chief Complaint  Patient presents with   Hyperlipidemia   BUNNON ON FOOT     Pt wants you to look at her bunion     HPI Vicki [name] presents for   Follow-up from October 11 visit.  Sleepiness/fatigue Thought to possibly be due to hydra seen.  Recommended lower dose of 10 mg 1/2 to 1 pill at bedtime.  We also discussed that untreated severe sleep apnea may also be contributing to daytime sleepiness, and recommended meeting with sleep specialist but declined previously ( Hx of severe OSA  with AHI 54/hr in 2021, not using CPAP - felt tired all day when used it, so stopped it -  Had been using all night for about 6 months. At least 4hr/night).   Also given the option to stop cholesterol pill for a week or 2 but unlikely cause.  Tried lower dose hydroxyzine '5mg'$  - getting to sleep ok. Sleeping through the night fine. Still yawning all day long.  Sleeping 7-8 hrs per night. Wakes up once overnight to go to bathroom.  Drinks coffee throughout day including at bedtime.  Stopped cholesterol pill since last visit - no change in sleep.   Lab Results  Component Value Date   CHOL 174 11/04/2021   HDL 51.80 11/04/2021   LDLCALC 100 (H) 11/04/2021   LDLDIRECT 114 (H) 06/11/2010   TRIG 109.0 11/04/2021   CHOLHDL 3 11/04/2021    Bunion of foot Left foot - starting to bother her more with exercise. Has seen podiatrist in past for same bunion - at [facility] not needing surgery in past as not painful, but is now sore.   At end of visit asks for letter for her dog as support animal. She will be moving and there is a pet fee at new location. She does not have therapist/counselor. Plans to discuss with visit for depression and stress in next 2 weeks.  Has declined meeting with therapist previously. Additionally want to meet to discuss vein concerns.   Also at end of visit  asked about her colonoscopy referral.  On review of last notes she was unable to have prior colonoscopy scheduled until the previous colonoscopy had been faxed.  We will have staff check into this further.    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   Influenza A 09/26/2018   COPD with acute exacerbation ([place]) 09/26/2018   Neck pain 02/05/2016   Leg swelling 01/19/2016   Dry cough 02/11/2015   Fatigue 02/11/2015   Prediabetes 10/29/2014   Polyuria 10/28/2014   Hyperglycemia 10/28/2014   Left breast mass 04/11/2014   Right hip pain 03/21/2014   Edema, lower extremity 03/21/2014   Low back pain 12/17/2013   Right knee pain 12/17/2013   Chest pain, central 11/05/2013   Lateral epicondylitis of left elbow 11/05/2013   Obstructive chronic bronchitis without exacerbation ([place]) 08/23/2013   Wrist fracture, right 07/08/2013   Sciatica 05/28/2013   Grief reaction 04/02/2013   Myalgia and myositis, unspecified 03/07/2012   Rotator cuff syndrome of left shoulder 03/07/2012   Depression with anxiety 03/07/2012   Foot deformity, acquired 06/21/2011   Callous ulcer ([place]) 06/21/2011   Acquired hallux valgus of left foot 05/10/2011   Sleep disorder 02/04/2011   Rotator cuff tear, right  09/03/2010   SHOULDER PAIN, RIGHT 08/20/2010   TOBACCO ABUSE 06/11/2010   DEPRESSIVE DISORDER NOT ELSEWHERE CLASSIFIED 06/11/2010   Past Medical History:  Diagnosis Date   Acquired hallux valgus of left foot    Bladder tumor    Callous ulcer ([place])    Depression with anxiety    DEPRESSIVE DISORDER NOT ELSEWHERE CLASSIFIED    Diabetes mellitus without complication ([place])    Phreesia 10/13/2020   Dyspnea    Foot deformity, acquired    Grief reaction    History of kidney stones    Hyperlipidemia    Phreesia 10/13/2020   Hypertension    Phreesia 10/13/2020   Myalgia and myositis, unspecified    Onychomycosis    Oral thrush     Pneumonia    4 months ago   Pre-diabetes    Rotator cuff syndrome of left shoulder    Rotator cuff tear, right    Shortness of breath    SHOULDER PAIN, RIGHT    Sleep apnea    Phreesia 10/13/2020   Sleep disorder    Urinary hesitancy    Past Surgical History:  Procedure Laterality Date   cryotherphy  1986   TRANSURETHRAL RESECTION OF BLADDER TUMOR N/A 12/21/2020   Procedure: TRANSURETHRAL RESECTION OF BLADDER TUMOR (TURBT)WITH CYSTOSTOPY/ BILATERAL RETROGRADE PYELOGRAM WITH LEFT STENT PLACEMENT;  Surgeon: Raynelle Bring, MD;  Location: [facility];  Service: Urology;  Laterality: N/A;  GENERAL ANESTHESIA WITHH PARALYSIS   TUBAL LIGATION     Allergies  Allergen Reactions   Codeine Nausea And Vomiting   Prior to Admission medications   Medication Sig Start Date End Date Taking? Authorizing Provider  ASPIRIN 81 PO Take 81 mg by mouth daily.    [provider]  atorvastatin (LIPITOR) 10 MG tablet take 1 tablet once daily 02/17/22   Wendie Agreste, MD  budesonide-formoterol [facility]) 160-4.5 MCG/ACT inhaler Inhale 2 puffs into the lungs 2 (two) times daily.    [provider]  Calcium Carb-Cholecalciferol (CALCIUM 600/VITAMIN D3 PO) Take 1 tablet by mouth daily.    [provider]  diclofenac Sodium (VOLTAREN) 1 % GEL Apply 4gms topically four times a day as needed 04/28/22   Wendie Agreste, MD  Diclofenac Sodium 1 % CREA Apply 4 g topically 4 (four) times daily as needed. 03/17/22   Wendie Agreste, MD  doxycycline (VIBRA-TABS) 100 MG tablet Take 1 tablet (100 mg total) by mouth 2 (two) times daily. 05/18/22   Wendie Agreste, MD  escitalopram (LEXAPRO) 20 MG tablet take 1 tablet once daily 02/24/22   Wendie Agreste, MD  fluticasone [facility]) 50 MCG/ACT nasal spray instill 2 sprays in each nostril once daily 02/24/22   Wendie Agreste, MD  hydrochlorothiazide (MICROZIDE) 12.5 MG capsule take 1 capsule once daily 02/24/22   Wendie Agreste, MD   hydrOXYzine (ATARAX) 10 MG tablet Take 0.5-1 tablets (5-10 mg total) by mouth at bedtime as needed. 05/18/22   Wendie Agreste, MD  lisinopril (ZESTRIL) 20 MG tablet Take 20 mg by mouth daily. 11/03/20   [provider]  meloxicam (MOBIC) 7.5 MG tablet TAKE 1 TABLET (7.5 MG TOTAL) BY MOUTH AT BEDTIME AS NEEDED FOR PAIN. Patient not taking: Reported on 05/18/2022 11/03/21   Wendie Agreste, MD  metFORMIN (GLUCOPHAGE) 500 MG tablet take 1 tablet once daily 02/24/22   Wendie Agreste, MD  Omega-3 Fatty Acids (FISH OIL) 1000 MG CAPS Take by mouth.    [provider]  vitamin E 180 MG (400 UNITS) capsule Take 400 Units by mouth daily.    [provider]   Social History   Socioeconomic History   Marital status: Widowed    Spouse name: Not on file   Number of children: Not on file   Years of education: Not on file   Highest education level: Not on file  Occupational History   Not on file  Tobacco Use   Smoking status: Every Day    Packs/day: 0.50    Types: Cigarettes   Smokeless tobacco: Never   Tobacco comments:    increased smoking again due to stress  Vaping Use   Vaping Use: Never used  Substance and Sexual Activity   Alcohol use: No   Drug use: No   Sexual activity: Never    Birth control/protection: Surgical, Post-menopausal  Other Topics Concern   Not on file  Social History Narrative   Not on file   Social Determinants of Health   Financial Resource Strain: Low Risk  (02/17/2022)   Overall Financial Resource Strain (CARDIA)    Difficulty of Paying Living Expenses: Not hard at all  Food Insecurity: No Food Insecurity (02/17/2022)   Hunger Vital Sign    Worried About Running Out of Food in the Last Year: Never true    Ran Out of Food in the Last Year: Never true  Transportation Needs: No Transportation Needs (02/17/2022)   PRAPARE - Hydrologist (Medical): No    Lack of Transportation (Non-Medical): No   Physical Activity: Sufficiently Active (02/17/2022)   Exercise Vital Sign    Days of Exercise per Week: 3 days    Minutes of Exercise per Session: 60 min  Stress: No Stress Concern Present (02/17/2022)   [place]    Feeling of Stress : Not at all  Social Connections: Moderately Isolated (02/17/2022)   Social Connection and Isolation Panel [NHANES]    Frequency of Communication with Friends and Family: Three times a week    Frequency of Social Gatherings with Friends and Family: Three times a week    Attends Religious Services: More than 4 times per year    Active Member of Clubs or Organizations: No    Attends Archivist Meetings: Never    Marital Status: Widowed  Intimate Partner Violence: Not At Risk (02/17/2022)   Humiliation, Afraid, Rape, and Kick questionnaire    Fear of Current or Ex-Partner: No    Emotionally Abused: No    Physically Abused: No    Sexually Abused: No    Review of Systems Per HPI.   Objective:   Vitals:   06/23/22 1335  BP: 124/76  Pulse: 78  Temp: 98.2 F (36.8 C)  SpO2: 95%  Weight: 159 lb (72.1 kg)  Height: '5\' 4"'$  (1.626 m)     Physical Exam Constitutional:      General: She is not in acute distress.    Appearance: Normal appearance. She is well-developed.  HENT:     Head: Normocephalic and atraumatic.  Cardiovascular:     Rate and Rhythm: Normal rate.  Pulmonary:     Effort: Pulmonary effort is normal.  Musculoskeletal:     Comments: Left foot, hallux valgus deformity, slight erythema medial aspect of the MTP skin but no wounds.  Tender to palpation at the first MTP, laterally.  Neurological:     Mental Status: She is alert and oriented  to person, place, and time.  Psychiatric:        Mood and Affect: Mood normal.        Assessment & Plan:  Vicki [name] is a 61 y.o. female . Daytime sleepiness - Plan: Ambulatory referral to Sleep Studies OSA  (obstructive sleep apnea) - Plan: Ambulatory referral to Sleep Studies  -Unfortunately it appears that her sleep apnea is likely the continued cause of her sedation/daytime sleepiness.  Medication changes noted above without significant improvement.  Can stop hydroxyzine if not needed at bedtime to see if that may help with her daytime sedation, and also recommended decrease caffeine/fluids before bedtime to minimize nocturia.  Recommended follow-up with sleep specialist to determine if other options may be available as she had difficulty with CPAP.  Hallux valgus with bunions of left foot  -Now painful.  Recommend she follow-up with previous podiatrist to discuss treatment options.  We will check into colonoscopy referral status.  Plans follow-up to discuss chronic vein issue, and anxiety further and request for letter for her dog to be a service animal/therapy animal.  I did provide phone numbers for counseling locally as they may also be able to help discussion of that need.  No orders of the defined types were placed in this encounter.  Patient Instructions  Cut back on coffee or other liquids right before bedtime to see if that helps with having to wake up to urinate during the night. If not needing the hydroxyzine to get to sleep,can stop to see if that changes daytime sleepiness, but severe sleep apnea is still likely the cause. I will refer you to sleep specialist to discuss options.  Restart cholesterol med - not the cause of sleepiness.   I do recommend following up with your podiatrist regarding the increased pain at the bunion to see if procedure is needed at this time or brace/other treatment.  It appears they need your previous colonoscopy faxed prior to scheduling new colonoscopy.  I will have Noah Delaine look into this further.    Please follow-up in the next 2 weeks and we can discuss anxiety treatment as well as possible need for support animal.  As we discussed this is typically  provided by treating psychologist or therapist based on their recommendations.  Here are a few names if you would like to meet with a therapist/psychologist.  Return to the clinic or go to the nearest emergency room if any of your symptoms worsen or new symptoms occur.   Here are a few options for counseling:  [facility]:  [place] [phone]          Signed,   Merri Ray, MD [place], [facility] 06/23/22 1:31 PM

## 2022-06-23 NOTE — Progress Notes (Signed)
Patient was not seen at [facility]

## 2022-06-23 NOTE — Progress Notes (Signed)
Called [facility] waiting for previous records from unknown location. They will be calling the patient and getting her scheduled in the meantime and proceeding as "no records available" until we can get these

## 2022-06-23 NOTE — Patient Instructions (Addendum)
Cut back on coffee or other liquids right before bedtime to see if that helps with having to wake up to urinate during the night. If not needing the hydroxyzine to get to sleep,can stop to see if that changes daytime sleepiness, but severe sleep apnea is still likely the cause. I will refer you to sleep specialist to discuss options.  Restart cholesterol med - not the cause of sleepiness.   I do recommend following up with your podiatrist regarding the increased pain at the bunion to see if procedure is needed at this time or brace/other treatment.  It appears they need your previous colonoscopy faxed prior to scheduling new colonoscopy.  I will have Noah Delaine look into this further.    Please follow-up in the next 2 weeks and we can discuss anxiety treatment as well as possible need for support animal.  As we discussed this is typically provided by treating psychologist or therapist based on their recommendations.  Here are a few names if you would like to meet with a therapist/psychologist.  Return to the clinic or go to the nearest emergency room if any of your symptoms worsen or new symptoms occur.   Here are a few options for counseling:  [facility]:  [place] [phone]

## 2022-06-24 NOTE — Progress Notes (Signed)
Called and LM with [facility] to find out if she has been a patient with them in the past

## 2022-06-27 NOTE — Progress Notes (Signed)
Called [facility] and she has not been seen with them either

## 2022-06-27 NOTE — Progress Notes (Signed)
[facility] Confirmed she was not a patient with them either

## 2022-06-29 ENCOUNTER — Other Ambulatory Visit: Payer: Self-pay | Admitting: Family Medicine

## 2022-06-29 DIAGNOSIS — F439 Reaction to severe stress, unspecified: Secondary | ICD-10-CM

## 2022-06-29 DIAGNOSIS — F418 Other specified anxiety disorders: Secondary | ICD-10-CM

## 2022-06-29 MED ORDER — HYDROXYZINE HCL 10 MG PO TABS: 5.0000 mg | ORAL_TABLET | Freq: Every evening | ORAL | 0 refills | Status: DC | PRN

## 2022-06-29 NOTE — Progress Notes (Signed)
Refilled hydroxyzine as needed but see last OV - stop if not needed.

## 2022-07-05 ENCOUNTER — Other Ambulatory Visit: Payer: Self-pay | Admitting: Family Medicine

## 2022-07-05 DIAGNOSIS — F418 Other specified anxiety disorders: Secondary | ICD-10-CM

## 2022-07-05 DIAGNOSIS — F439 Reaction to severe stress, unspecified: Secondary | ICD-10-CM

## 2022-07-07 ENCOUNTER — Ambulatory Visit: Payer: 59 | Admitting: Family Medicine

## 2022-07-07 ENCOUNTER — Ambulatory Visit
Admission: RE | Admit: 2022-07-07 | Discharge: 2022-07-07 | Disposition: A | Discharge status: Home / Self Care | Payer: 59 | Source: Ambulatory Visit | Attending: Family Medicine | Admitting: Family Medicine

## 2022-07-07 DIAGNOSIS — Z1231 Encounter for screening mammogram for malignant neoplasm of breast: Secondary | ICD-10-CM

## 2022-10-17 ENCOUNTER — Ambulatory Visit: Payer: 59 | Admitting: Family Medicine

## 2022-10-25 ENCOUNTER — Other Ambulatory Visit: Payer: Self-pay | Admitting: Family Medicine

## 2022-10-27 ENCOUNTER — Ambulatory Visit (INDEPENDENT_AMBULATORY_CARE_PROVIDER_SITE_OTHER): Payer: 59 | Admitting: Family Medicine

## 2022-10-27 ENCOUNTER — Encounter: Payer: Self-pay | Admitting: Family Medicine

## 2022-10-27 VITALS — BP 122/66 | HR 88 | Temp 98.7°F | Ht 64.0 in | Wt 154.0 lb

## 2022-10-27 DIAGNOSIS — G4733 Obstructive sleep apnea (adult) (pediatric): Secondary | ICD-10-CM

## 2022-10-27 DIAGNOSIS — R42 Dizziness and giddiness: Secondary | ICD-10-CM

## 2022-10-27 DIAGNOSIS — R7303 Prediabetes: Secondary | ICD-10-CM

## 2022-10-27 DIAGNOSIS — I10 Essential (primary) hypertension: Secondary | ICD-10-CM

## 2022-10-27 DIAGNOSIS — E785 Hyperlipidemia, unspecified: Secondary | ICD-10-CM

## 2022-10-27 DIAGNOSIS — F418 Other specified anxiety disorders: Secondary | ICD-10-CM | POA: Diagnosis not present

## 2022-10-27 DIAGNOSIS — R400 Somnolence: Secondary | ICD-10-CM

## 2022-10-27 MED ORDER — LISINOPRIL 10 MG PO TABS: 10.0000 mg | ORAL_TABLET | Freq: Every day | ORAL | 1 refills | Status: DC

## 2022-10-27 MED ORDER — HYDROCHLOROTHIAZIDE 12.5 MG PO CAPS: 12.5000 mg | ORAL_CAPSULE | Freq: Every day | ORAL | 11 refills | Status: DC

## 2022-10-27 MED ORDER — ESCITALOPRAM OXALATE 20 MG PO TABS: 20.0000 mg | ORAL_TABLET | Freq: Every day | ORAL | 11 refills | Status: DC

## 2022-10-27 MED ORDER — METFORMIN HCL 500 MG PO TABS: 500.0000 mg | ORAL_TABLET | Freq: Every day | ORAL | 11 refills | Status: DC

## 2022-10-27 MED ORDER — ATORVASTATIN CALCIUM 10 MG PO TABS: 10.0000 mg | ORAL_TABLET | Freq: Every day | ORAL | 11 refills | Status: DC

## 2022-10-27 NOTE — Patient Instructions (Addendum)
Try lower dose of lisinopril to see if blood pressure is too well controlled causing your dizziness (split in half until new prescription arrives) Try to drink more water and cut back by 2 caffeine drinks for now (3 per day).  I expect symptoms to improve, but we will discuss again in video visit in 6 days.  Return to the clinic or go to the nearest emergency room if any of your symptoms worsen or new symptoms occur.  Call sleep specialist for appointment as fatigue is still likely in part from the untreated sleep apnea. Call [phone] to schedule an office visit with Dr. Star Age for sleep.    Dizziness Dizziness is a common problem. It is a feeling of unsteadiness or light-headedness. You may feel like you are about to faint. Dizziness can lead to injury if you stumble or fall. Anyone can become dizzy, but dizziness is more common in older adults. This condition can be caused by a number of things, including medicines, dehydration, or illness. Follow these instructions at home: Eating and drinking  Drink enough fluid to keep your urine pale yellow. This helps to keep you from becoming dehydrated. Try to drink more clear fluids, such as water. Do not drink alcohol. Limit your caffeine intake if told to do so by your health care provider. Check ingredients and nutrition facts to see if a food or beverage contains caffeine. Limit your salt (sodium) intake if told to do so by your health care provider. Check ingredients and nutrition facts to see if a food or beverage contains sodium. Activity  Avoid making quick movements. Rise slowly from chairs and steady yourself until you feel okay. In the morning, first sit up on the side of the bed. When you feel okay, stand slowly while you hold onto something until you know that your balance is good. If you need to stand in one place for a long time, move your legs often. Tighten and relax the muscles in your legs while you are standing. Do not  drive or use machinery if you feel dizzy. Avoid bending down if you feel dizzy. Place items in your home so that they are easy for you to reach without leaning over. Lifestyle Do not use any products that contain nicotine or tobacco. These products include cigarettes, chewing tobacco, and vaping devices, such as e-cigarettes. If you need help quitting, ask your health care provider. Try to reduce your stress level by using methods such as yoga or meditation. Talk with your health care provider if you need help to manage your stress. General instructions Watch your dizziness for any changes. Take over-the-counter and prescription medicines only as told by your health care provider. Talk with your health care provider if you think that your dizziness is caused by a medicine that you are taking. Tell a friend or a family member that you are feeling dizzy. If he or she notices any changes in your behavior, have this person call your health care provider. Keep all follow-up visits. This is important. Contact a health care provider if: Your dizziness does not go away or you have new symptoms. Your dizziness or light-headedness gets worse. You feel nauseous. You have reduced hearing. You have a fever. You have neck pain or a stiff neck. Your dizziness leads to an injury or a fall. Get help right away if: You vomit or have diarrhea and are unable to eat or drink anything. You have problems talking, walking, swallowing, or using your arms,  hands, or legs. You feel generally weak. You have any bleeding. You are not thinking clearly or you have trouble forming sentences. It may take a friend or family member to notice this. You have chest pain, abdominal pain, shortness of breath, or sweating. Your vision changes or you develop a severe headache. These symptoms may represent a serious problem that is an emergency. Do not wait to see if the symptoms will go away. Get medical help right away. Call your  local emergency services (911 in the [place] Do not drive yourself to the hospital. Summary Dizziness is a feeling of unsteadiness or light-headedness. This condition can be caused by a number of things, including medicines, dehydration, or illness. Anyone can become dizzy, but dizziness is more common in older adults. Drink enough fluid to keep your urine pale yellow. Do not drink alcohol. Avoid making quick movements if you feel dizzy. Monitor your dizziness for any changes. This information is not intended to replace advice given to you by your health care provider. Make sure you discuss any questions you have with your health care provider. Document Revised: 06/29/2020 Document Reviewed: 06/29/2020 Elsevier Patient Education  [place].

## 2022-10-27 NOTE — Progress Notes (Signed)
Subjective:  Patient ID: Vicki [name], female    DOB: 1960/12/04  Age: 62 y.o. MRN: [id number]  CC:  Chief Complaint  Patient presents with   Anxiety   Dizziness    Pt notes she will get dizzy on standing from laying down over the last week     HPI Vicki [name] presents for   Fatigue/sleepiness Discussed at her November 16 visit.  Possibly due to hydroxyzine previously, lower dose recommended but also untreated severe sleep apnea.  Reported feeling tired during the day with use of CPAP so stopped on her own.  Lower dose hydroxyzine 5 mg was still effective, but still felt sedated during the day.  Sleep volume of 7 to 8 hours per night at that time.  No change in sleepiness with a trial off statin.  Recommended decrease fluids before bedtime including caffeine, and option to stop hydroxyzine altogether.  Refer to sleep specialist to discuss options given concerns with CPAP previously and persistent daytime sleepiness. Notes from sleep specialist referral November 30, still established at their office, plan for patient to call and schedule appointment with Dr. Rexene Alberts.  No appointment has been scheduled. Still sleepy during the day. Taking whole hydroxyzine pill - more anxious at night. Needs full hydroxyzine pill.   Depression with anxiety Treated with Lexapro 20 mg daily.  Refill discussed in November, plan for close follow-up to discuss further but also provided numbers for therapist, she did note concern for possible need for support animal.  She did not have a therapist at that time, was moving to a new location that had a pet fee. Did not call therapist, does not want to meet with therapist.  New living situation, happier with current living situation with her sister.  Still some trouble with anxiety. Buspirone did not help in the past.  Feels like overall doing ok, just situational anxiety.   Hypertension: Lisinopril 20 mg daily, hydrochlorothiazide 12.5 mg daily. Reports  dizziness with standing past week.  Notes when first waking up and sitting up in the morning. Feels like leaning to one side. No focal weakness. Usually resolves in about 5-10 minutes. Few days ago was worse - about an hour, then fuzzy off and on all day. Better past few days and better when propped up.  5 caffeine drinks per day.  No room spinning.  No melena, hematochezia.  No chest pain, palpitations. Home readings: 115-118/70 range.  BP Readings from Last 3 Encounters:  10/27/22 122/66  06/23/22 124/76  05/18/22 138/78   Lab Results  Component Value Date   CREATININE 0.66 11/04/2021   Hyperlipidemia: Lipitor 10 mg daily - no new side effects or myalgias.  Lab Results  Component Value Date   CHOL 174 11/04/2021   HDL 51.80 11/04/2021   LDLCALC 100 (H) 11/04/2021   LDLDIRECT 114 (H) 06/11/2010   TRIG 109.0 11/04/2021   CHOLHDL 3 11/04/2021   Lab Results  Component Value Date   ALT 13 11/04/2021   AST 12 11/04/2021   ALKPHOS 75 11/04/2021   BILITOT 0.4 11/04/2021    Prediabetes/diabetes.  Variable A1c past few years ranging from 6.1-6.5.  Only most recently in March of last year was at 6.5.  Previous prediabetes level. On metformin 500mg  qd.  Rare soda, no sweet tea.  Some exercise until recent dizziness.  Glucose 111 this am after coffee No low readings.    Lab Results  Component Value Date   HGBA1C 6.5 11/04/2021   Wt  Readings from Last 3 Encounters:  10/27/22 154 lb (69.9 kg)  06/23/22 159 lb (72.1 kg)  05/18/22 161 lb 12.8 oz (73.4 kg)   Health maintenance Referred for colonoscopy previously, apparently that was not able to be scheduled until previous colonoscopy report was sent.  Difficulty obtaining prior report.  Was told a few months ago by [facility] that does not need for 10 years - had in about 2018-2019.   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   Influenza A  09/26/2018   COPD with acute exacerbation ([place]) 09/26/2018   Neck pain 02/05/2016   Leg swelling 01/19/2016   Dry cough 02/11/2015   Fatigue 02/11/2015   Prediabetes 10/29/2014   Polyuria 10/28/2014   Hyperglycemia 10/28/2014   Left breast mass 04/11/2014   Right hip pain 03/21/2014   Edema, lower extremity 03/21/2014   Low back pain 12/17/2013   Right knee pain 12/17/2013   Chest pain, central 11/05/2013   Lateral epicondylitis of left elbow 11/05/2013   Obstructive chronic bronchitis without exacerbation ([place]) 08/23/2013   Wrist fracture, right 07/08/2013   Sciatica 05/28/2013   Grief reaction 04/02/2013   Myalgia and myositis, unspecified 03/07/2012   Rotator cuff syndrome of left shoulder 03/07/2012   Depression with anxiety 03/07/2012   Foot deformity, acquired 06/21/2011   Callous ulcer ([place]) 06/21/2011   Acquired hallux valgus of left foot 05/10/2011   Sleep disorder 02/04/2011   Rotator cuff tear, right 09/03/2010   SHOULDER PAIN, RIGHT 08/20/2010   TOBACCO ABUSE 06/11/2010   DEPRESSIVE DISORDER NOT ELSEWHERE CLASSIFIED 06/11/2010   Past Medical History:  Diagnosis Date   Acquired hallux valgus of left foot    Bladder tumor    Callous ulcer ([place])    Depression with anxiety    DEPRESSIVE DISORDER NOT ELSEWHERE CLASSIFIED    Diabetes mellitus without complication ([place])    Phreesia 10/13/2020   Dyspnea    Foot deformity, acquired    Grief reaction    History of kidney stones    Hyperlipidemia    Phreesia 10/13/2020   Hypertension    Phreesia 10/13/2020   Myalgia and myositis, unspecified    Onychomycosis    Oral thrush    Pneumonia    4 months ago   Pre-diabetes    Rotator cuff syndrome of left shoulder    Rotator cuff tear, right    Shortness of breath    SHOULDER PAIN, RIGHT    Sleep apnea    Phreesia 10/13/2020   Sleep disorder    Urinary hesitancy    Past Surgical History:  Procedure Laterality Date   cryotherphy  1986   TRANSURETHRAL  RESECTION OF BLADDER TUMOR N/A 12/21/2020   Procedure: TRANSURETHRAL RESECTION OF BLADDER TUMOR (TURBT)WITH CYSTOSTOPY/ BILATERAL RETROGRADE PYELOGRAM WITH LEFT STENT PLACEMENT;  Surgeon: Raynelle Bring, MD;  Location: [facility];  Service: Urology;  Laterality: N/A;  GENERAL ANESTHESIA WITHH PARALYSIS   TUBAL LIGATION     Allergies  Allergen Reactions   Codeine Nausea And Vomiting   Prior to Admission medications   Medication Sig Start Date End Date Taking? Authorizing Provider  ASPIRIN 81 PO Take 81 mg by mouth daily.    [provider]  atorvastatin (LIPITOR) 10 MG tablet take 1 tablet once daily 02/17/22   Wendie Agreste, MD  budesonide-formoterol [facility]) 160-4.5 MCG/ACT inhaler Inhale 2 puffs into the lungs 2 (two) times daily.  [provider]  Calcium Carb-Cholecalciferol (CALCIUM 600/VITAMIN D3 PO) Take 1 tablet by mouth daily.    [provider]  diclofenac Sodium (VOLTAREN) 1 % GEL Apply 4gms topically four times a day as needed 10/25/22   Wendie Agreste, MD  Diclofenac Sodium 1 % CREA Apply 4 g topically 4 (four) times daily as needed. Patient not taking: Reported on 06/23/2022 03/17/22   Wendie Agreste, MD  doxycycline (VIBRA-TABS) 100 MG tablet Take 1 tablet (100 mg total) by mouth 2 (two) times daily. Patient not taking: Reported on 06/23/2022 05/18/22   Wendie Agreste, MD  escitalopram [facility]) 20 MG tablet take 1 tablet once daily 02/24/22   Wendie Agreste, MD  fluticasone [facility]) 50 MCG/ACT nasal spray instill 2 sprays in each nostril once daily 02/24/22   Wendie Agreste, MD  hydrochlorothiazide (MICROZIDE) 12.5 MG capsule take 1 capsule once daily 02/24/22   Wendie Agreste, MD  hydrOXYzine (ATARAX) 10 MG tablet Take one-half to one tablet by mouth at bedtime only as needed 07/05/22   Wendie Agreste, MD  lisinopril (ZESTRIL) 20 MG tablet Take 20 mg by mouth daily. 11/03/20   [provider]  meloxicam (MOBIC) 7.5 MG  tablet TAKE 1 TABLET (7.5 MG TOTAL) BY MOUTH AT BEDTIME AS NEEDED FOR PAIN. Patient not taking: Reported on 05/18/2022 11/03/21   Wendie Agreste, MD  metFORMIN (GLUCOPHAGE) 500 MG tablet take 1 tablet once daily Patient not taking: Reported on 06/23/2022 02/24/22   Wendie Agreste, MD  Omega-3 Fatty Acids (FISH OIL) 1000 MG CAPS Take by mouth.    [provider]  vitamin E 180 MG (400 UNITS) capsule Take 400 Units by mouth daily.    [provider]   Social History   Socioeconomic History   Marital status: Widowed    Spouse name: Not on file   Number of children: Not on file   Years of education: Not on file   Highest education level: Not on file  Occupational History   Not on file  Tobacco Use   Smoking status: Every Day    Packs/day: .5    Types: Cigarettes   Smokeless tobacco: Never   Tobacco comments:    increased smoking again due to stress  Vaping Use   Vaping Use: Never used  Substance and Sexual Activity   Alcohol use: No   Drug use: No   Sexual activity: Never    Birth control/protection: Surgical, Post-menopausal  Other Topics Concern   Not on file  Social History Narrative   Not on file   Social Determinants of Health   Financial Resource Strain: Low Risk  (02/17/2022)   Overall Financial Resource Strain (CARDIA)    Difficulty of Paying Living Expenses: Not hard at all  Food Insecurity: No Food Insecurity (02/17/2022)   Hunger Vital Sign    Worried About Running Out of Food in the Last Year: Never true    [place] in the Last Year: Never true  Transportation Needs: No Transportation Needs (02/17/2022)   PRAPARE - Hydrologist (Medical): No    Lack of Transportation (Non-Medical): No  Physical Activity: Sufficiently Active (02/17/2022)   Exercise Vital Sign    Days of Exercise per Week: 3 days    Minutes of Exercise per Session: 60 min  Stress: No Stress Concern Present (02/17/2022)   [place]  Feeling of Stress : Not at all  Social Connections: Moderately Isolated (02/17/2022)   Social Connection and Isolation Panel [NHANES]    Frequency of Communication with Friends and Family: Three times a week    Frequency of Social Gatherings with Friends and Family: Three times a week    Attends Religious Services: More than 4 times per year    Active Member of Clubs or Organizations: No    Attends Archivist Meetings: Never    Marital Status: Widowed  Intimate Partner Violence: Not At Risk (02/17/2022)   Humiliation, Afraid, Rape, and Kick questionnaire    Fear of Current or Ex-Partner: No    Emotionally Abused: No    Physically Abused: No    Sexually Abused: No    Review of Systems Per HPI   Objective:   Vitals:   10/27/22 1427  BP: 122/66  Pulse: 88  Temp: 98.7 F (37.1 C)  TempSrc: Temporal  SpO2: 94%  Weight: 154 lb (69.9 kg)  Height: 5\' 4"  (1.626 m)     Physical Exam Vitals reviewed.  Constitutional:      Appearance: Normal appearance. She is well-developed.  HENT:     Head: Normocephalic and atraumatic.  Eyes:     Conjunctiva/sclera: Conjunctivae normal.     Pupils: Pupils are equal, round, and reactive to light.  Neck:     Vascular: No carotid bruit.  Cardiovascular:     Rate and Rhythm: Normal rate and regular rhythm.     Heart sounds: Normal heart sounds.  Pulmonary:     Effort: Pulmonary effort is normal.     Breath sounds: Normal breath sounds.  Abdominal:     Palpations: Abdomen is soft. There is no pulsatile mass.     Tenderness: There is no abdominal tenderness.  Musculoskeletal:     Right lower leg: No edema.     Left lower leg: No edema.  Skin:    General: Skin is warm and dry.  Neurological:     General: No focal deficit present.     Mental Status: She is alert and oriented to person, place, and time. Mental status is at baseline.     Motor: No weakness.     Gait:  Gait normal.  Psychiatric:        Mood and Affect: Mood normal.        Behavior: Behavior normal.     Orthostatic VS for the past 24 hrs (Last 3 readings):  BP- Lying Pulse- Lying BP- Sitting Pulse- Sitting BP- Standing at 0 minutes Pulse- Standing at 0 minutes BP- Standing at 3 minutes Pulse- Standing at 3 minutes  10/27/22 1434 122/60 63 120/66 71 120/64 73 122/60 76    Assessment & Plan:  Vicki [name] is a 62 y.o. female . Daytime sleepiness OSA (obstructive sleep apnea)  -Suspected untreated sleep apnea as primary cause of daytime sleepiness.  Phone number provided for her to contact sleep specialist to discuss treatment options.  Depression with anxiety - Plan: escitalopram (LEXAPRO) 20 MG tablet  -As above she reports overall stable control with use of hydroxyzine at night, Lexapro during the day.  Declines changes at this time.  Prediabetes - Plan: Hemoglobin A1c, metFORMIN (GLUCOPHAGE) 500 MG tablet  -Continue metformin, check updated labs with medication adjustments accordingly.  Essential hypertension - Plan: Comprehensive metabolic panel, lisinopril (ZESTRIL) 10 MG tablet, hydrochlorothiazide (MICROZIDE) 12.5 MG capsule Lightheadedness - Plan: CBC Episodic lightheadedness in the morning, orthostatic type symptoms but not  orthostatic in the office and not noticing during the day.  Blood pressure overall stable, lower at home, will try lower dose of lisinopril for now, home monitoring with virtual visit in next week.  Also recommended increased water, decrease caffeine intake.  Check CBC, electrolytes as above, nonfocal neuroexam.  RTC/ER precautions given.  Hyperlipidemia, unspecified hyperlipidemia type - Plan: Comprehensive metabolic panel, Lipid panel, atorvastatin (LIPITOR) 10 MG tablet Tolerating current regimen, continue Lipitor, updated labs with medication adjustment/plan adjustment accordingly. Meds ordered this encounter  Medications   lisinopril (ZESTRIL) 10  MG tablet    Sig: Take 1 tablet (10 mg total) by mouth daily.    Dispense:  90 tablet    Refill:  1   atorvastatin (LIPITOR) 10 MG tablet    Sig: Take 1 tablet (10 mg total) by mouth daily.    Dispense:  30 tablet    Refill:  11    [facility] PATIENTS [facility] - PLEASE SEND ALL FUTURE MEDS HERE.   escitalopram (LEXAPRO) 20 MG tablet    Sig: Take 1 tablet (20 mg total) by mouth daily.    Dispense:  30 tablet    Refill:  11    [facility] - PLEASE SEND ALL FUTURE MEDS HERE.   hydrochlorothiazide (MICROZIDE) 12.5 MG capsule    Sig: Take 1 capsule (12.5 mg total) by mouth daily.    Dispense:  30 capsule    Refill:  11    [facility] - PLEASE SEND ALL FUTURE MEDS HERE.   metFORMIN (GLUCOPHAGE) 500 MG tablet    Sig: Take 1 tablet (500 mg total) by mouth daily.    Dispense:  30 tablet    Refill:  11    [facility] - PLEASE SEND ALL FUTURE MEDS HERE.   Patient Instructions  Try lower dose of lisinopril to see if blood pressure is too well controlled causing your dizziness (split in half until new prescription arrives) Try to drink more water and cut back by 2 caffeine drinks for now (3 per day).  I expect symptoms to improve, but we will discuss again in video visit in 6 days.  Return to the clinic or go to the nearest emergency room if any of your symptoms worsen or new symptoms occur.  Call sleep specialist for appointment as fatigue is still likely in part from the untreated sleep apnea. Call [phone] to schedule an office visit with Dr. Star Age for sleep.    Dizziness Dizziness is a common problem. It is a feeling of unsteadiness or light-headedness. You may feel like you are about to faint. Dizziness can lead to injury if you stumble or fall. Anyone can become dizzy, but dizziness is more common in older adults. This condition can be caused by a number of things, including medicines, dehydration, or  illness. Follow these instructions at home: Eating and drinking  Drink enough fluid to keep your urine pale yellow. This helps to keep you from becoming dehydrated. Try to drink more clear fluids, such as water. Do not drink alcohol. Limit your caffeine intake if told to do so by your health care provider. Check ingredients and nutrition facts to see if a food or beverage contains caffeine. Limit your salt (sodium) intake if told to do so by your health care provider. Check ingredients and nutrition facts to see if a food or beverage contains sodium. Activity  Avoid making quick movements. Rise slowly from  chairs and steady yourself until you feel okay. In the morning, first sit up on the side of the bed. When you feel okay, stand slowly while you hold onto something until you know that your balance is good. If you need to stand in one place for a long time, move your legs often. Tighten and relax the muscles in your legs while you are standing. Do not drive or use machinery if you feel dizzy. Avoid bending down if you feel dizzy. Place items in your home so that they are easy for you to reach without leaning over. Lifestyle Do not use any products that contain nicotine or tobacco. These products include cigarettes, chewing tobacco, and vaping devices, such as e-cigarettes. If you need help quitting, ask your health care provider. Try to reduce your stress level by using methods such as yoga or meditation. Talk with your health care provider if you need help to manage your stress. General instructions Watch your dizziness for any changes. Take over-the-counter and prescription medicines only as told by your health care provider. Talk with your health care provider if you think that your dizziness is caused by a medicine that you are taking. Tell a friend or a family member that you are feeling dizzy. If he or she notices any changes in your behavior, have this person call your health care  provider. Keep all follow-up visits. This is important. Contact a health care provider if: Your dizziness does not go away or you have new symptoms. Your dizziness or light-headedness gets worse. You feel nauseous. You have reduced hearing. You have a fever. You have neck pain or a stiff neck. Your dizziness leads to an injury or a fall. Get help right away if: You vomit or have diarrhea and are unable to eat or drink anything. You have problems talking, walking, swallowing, or using your arms, hands, or legs. You feel generally weak. You have any bleeding. You are not thinking clearly or you have trouble forming sentences. It may take a friend or family member to notice this. You have chest pain, abdominal pain, shortness of breath, or sweating. Your vision changes or you develop a severe headache. These symptoms may represent a serious problem that is an emergency. Do not wait to see if the symptoms will go away. Get medical help right away. Call your local emergency services (911 in the [place] Do not drive yourself to the hospital. Summary Dizziness is a feeling of unsteadiness or light-headedness. This condition can be caused by a number of things, including medicines, dehydration, or illness. Anyone can become dizzy, but dizziness is more common in older adults. Drink enough fluid to keep your urine pale yellow. Do not drink alcohol. Avoid making quick movements if you feel dizzy. Monitor your dizziness for any changes. This information is not intended to replace advice given to you by your health care provider. Make sure you discuss any questions you have with your health care provider. Document Revised: 06/29/2020 Document Reviewed: 06/29/2020 Elsevier Patient Education  2023 [employer],   Merri Ray, MD [facility] 10/27/22 5:57 PM

## 2022-10-28 LAB — COMPREHENSIVE METABOLIC PANEL
ALT: 12 U/L (ref 0–35)
AST: 14 U/L (ref 0–37)
Albumin: 4.3 g/dL (ref 3.5–5.2)
Alkaline Phosphatase: 68 U/L (ref 39–117)
BUN: 14 mg/dL (ref 6–23)
CO2: 31 mEq/L (ref 19–32)
Calcium: 9.4 mg/dL (ref 8.4–10.5)
Chloride: 97 mEq/L (ref 96–112)
Creatinine, Ser: 0.71 mg/dL (ref 0.40–1.20)
GFR: 90.81 mL/min (ref >60.00)
Glucose, Bld: 107 mg/dL — ABNORMAL HIGH (ref 70–99)
Potassium: 4.4 mEq/L (ref 3.5–5.1)
Sodium: 137 mEq/L (ref 135–145)
Total Bilirubin: 0.2 mg/dL (ref 0.2–1.2)
Total Protein: 7.3 g/dL (ref 6.0–8.3)

## 2022-10-28 LAB — HEMOGLOBIN A1C: Hgb A1c MFr Bld: 6.5 % (ref 4.6–6.5)

## 2022-10-28 LAB — CBC
HCT: 43.3 % (ref 36.0–46.0)
Hemoglobin: 14.5 g/dL (ref 12.0–15.0)
MCHC: 33.6 g/dL (ref 30.0–36.0)
MCV: 87.1 fl (ref 78.0–100.0)
Platelets: 412 10*3/uL — ABNORMAL HIGH (ref 150.0–400.0)
RBC: 4.97 Mil/uL (ref 3.87–5.11)
RDW: 13.3 % (ref 11.5–15.5)
WBC: 9.1 10*3/uL (ref 4.0–10.5)

## 2022-10-28 LAB — LIPID PANEL
Cholesterol: 231 mg/dL — ABNORMAL HIGH (ref 0–200)
HDL: 53.4 mg/dL (ref >39.00)
NonHDL: 177.42
Total CHOL/HDL Ratio: 4
Triglycerides: 209 mg/dL — ABNORMAL HIGH (ref 0.0–149.0)
VLDL: 41.8 mg/dL — ABNORMAL HIGH (ref 0.0–40.0)

## 2022-10-28 LAB — LDL CHOLESTEROL, DIRECT: Direct LDL: 120 mg/dL

## 2022-11-07 ENCOUNTER — Telehealth (INDEPENDENT_AMBULATORY_CARE_PROVIDER_SITE_OTHER): Payer: 59 | Admitting: Family Medicine

## 2022-11-07 ENCOUNTER — Encounter: Payer: Self-pay | Admitting: Family Medicine

## 2022-11-07 VITALS — Wt 152.0 lb

## 2022-11-07 DIAGNOSIS — I10 Essential (primary) hypertension: Secondary | ICD-10-CM | POA: Diagnosis not present

## 2022-11-07 DIAGNOSIS — R42 Dizziness and giddiness: Secondary | ICD-10-CM | POA: Diagnosis not present

## 2022-11-07 NOTE — Progress Notes (Signed)
Virtual Visit via Video Note Called at 224- not on link - advised to join video link.  Spotty reception on video at times.   I connected with Vicki [name] on 11/07/22 at 2:24 PM by a video enabled telemedicine application and verified that I am speaking with the correct person using two identifiers.  Patient location: home, with consent to discuss PHI.  My location: office - [place].    I discussed the limitations, risks, security and privacy concerns of performing an evaluation and management service by telephone and the availability of in person appointments. I also discussed with the patient that there may be a patient responsible charge related to this service. The patient expressed understanding and agreed to proceed, consent obtained  Chief complaint:  Chief Complaint  Patient presents with   Dizziness    Pt states that since she has switched to taking half of the lisinopril she haas been getting dizzy, she states she had some nausea.      History of Present Illness: Vicki [name] is a 62 y.o. female  Dizziness, hypertension: Discussed at March 21 visit.  Platelets borderline elevated but otherwise CBC okay with hemoglobin 14.5.  Glucose was few points elevated at 107 with otherwise normal CMP.  Symptoms primarily noted first thing in the morning, will usually resolve in 5 to 10 minutes, then symptoms during the day a few days prior to last visit.  Was also drinking significant caffeine with 5 caffeinated drinks per day.  Recommended decrease caffeine, also lowered her lisinopril to 10 mg, continued hydrochlorothiazide 12.5 mg and increased water intake recommended.  3 coffees per day - down from 5. Drinks water all the time.  On 10mg  lisinopril now each day.  Home BP - 120/70-82.  Lightheadedness is better on lower dose of meds. No nausea since dizzy episodes. One episode of food poisoning. Slight dizziness with exercise at times, but not new.  Overall feels  better.    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   Influenza A 09/26/2018   COPD with acute exacerbation 09/26/2018   Neck pain 02/05/2016   Leg swelling 01/19/2016   Dry cough 02/11/2015   Fatigue 02/11/2015   Prediabetes 10/29/2014   Polyuria 10/28/2014   Hyperglycemia 10/28/2014   Left breast mass 04/11/2014   Right hip pain 03/21/2014   Edema, lower extremity 03/21/2014   Low back pain 12/17/2013   Right knee pain 12/17/2013   Chest pain, central 11/05/2013   Lateral epicondylitis of left elbow 11/05/2013   Obstructive chronic bronchitis without exacerbation ([place]) 08/23/2013   Wrist fracture, right 07/08/2013   Sciatica 05/28/2013   Grief reaction 04/02/2013   Myalgia and myositis, unspecified 03/07/2012   Rotator cuff syndrome of left shoulder 03/07/2012   Depression with anxiety 03/07/2012   Foot deformity, acquired 06/21/2011   Callous ulcer 06/21/2011   Acquired hallux valgus of left foot 05/10/2011   Sleep disorder 02/04/2011   Rotator cuff tear, right 09/03/2010   SHOULDER PAIN, RIGHT 08/20/2010   TOBACCO ABUSE 06/11/2010   DEPRESSIVE DISORDER NOT ELSEWHERE CLASSIFIED 06/11/2010   Past Medical History:  Diagnosis Date   Acquired hallux valgus of left foot    Bladder tumor    Callous ulcer    Depression with anxiety    DEPRESSIVE DISORDER NOT ELSEWHERE CLASSIFIED    Diabetes mellitus without complication    [facility]ia 10/13/2020   Dyspnea    Foot  deformity, acquired    Grief reaction    History of kidney stones    Hyperlipidemia    [facility] 10/13/2020   Hypertension    [facility]ia 10/13/2020   Myalgia and myositis, unspecified    Onychomycosis    Oral thrush    Pneumonia    4 months ago   Pre-diabetes    Rotator cuff syndrome of left shoulder    Rotator cuff tear, right    Shortness of breath    SHOULDER PAIN, RIGHT    Sleep apnea    [facility]ia 10/13/2020   Sleep disorder    Urinary  hesitancy    Past Surgical History:  Procedure Laterality Date   cryotherphy  1986   TRANSURETHRAL RESECTION OF BLADDER TUMOR N/A 12/21/2020   Procedure: TRANSURETHRAL RESECTION OF BLADDER TUMOR (TURBT)WITH CYSTOSTOPY/ BILATERAL RETROGRADE PYELOGRAM WITH LEFT STENT PLACEMENT;  Surgeon: Raynelle Bring, MD;  Location: [facility];  Service: Urology;  Laterality: N/A;  GENERAL ANESTHESIA WITHH PARALYSIS   TUBAL LIGATION     Allergies  Allergen Reactions   Codeine Nausea And Vomiting   Prior to Admission medications   Medication Sig Start Date End Date Taking? Authorizing Provider  ASPIRIN 81 PO Take 81 mg by mouth daily.   Yes [provider]  atorvastatin (LIPITOR) 10 MG tablet Take 1 tablet (10 mg total) by mouth daily. 10/27/22  Yes Wendie Agreste, MD  budesonide-formoterol [facility]) 160-4.5 MCG/ACT inhaler Inhale 2 puffs into the lungs 2 (two) times daily.   Yes [provider]  Calcium Carb-Cholecalciferol (CALCIUM 600/VITAMIN D3 PO) Take 1 tablet by mouth daily.   Yes [provider]  escitalopram (LEXAPRO) 20 MG tablet Take 1 tablet (20 mg total) by mouth daily. 10/27/22  Yes Wendie Agreste, MD  fluticasone Asencion Islam) 50 MCG/ACT nasal spray instill 2 sprays in each nostril once daily 02/24/22  Yes Wendie Agreste, MD  hydrochlorothiazide (MICROZIDE) 12.5 MG capsule Take 1 capsule (12.5 mg total) by mouth daily. 10/27/22  Yes Wendie Agreste, MD  hydrOXYzine (ATARAX) 10 MG tablet Take one-half to one tablet by mouth at bedtime only as needed 07/05/22  Yes Wendie Agreste, MD  lisinopril (ZESTRIL) 10 MG tablet Take 1 tablet (10 mg total) by mouth daily. 10/27/22  Yes Wendie Agreste, MD  metFORMIN (GLUCOPHAGE) 500 MG tablet Take 1 tablet (500 mg total) by mouth daily. 10/27/22  Yes Wendie Agreste, MD  Omega-3 Fatty Acids (FISH OIL) 1000 MG CAPS Take by mouth.   Yes [provider]  vitamin E 180 MG (400 UNITS) capsule Take 400 Units by mouth  daily.   Yes [provider]  diclofenac Sodium (VOLTAREN) 1 % GEL Apply 4gms topically four times a day as needed 10/25/22   Wendie Agreste, MD   Social History   Socioeconomic History   Marital status: Widowed    Spouse name: Not on file   Number of children: Not on file   Years of education: Not on file   Highest education level: Not on file  Occupational History   Not on file  Tobacco Use   Smoking status: Every Day    Packs/day: .5    Types: Cigarettes   Smokeless tobacco: Never   Tobacco comments:    increased smoking again due to stress  Vaping Use   Vaping Use: Never used  Substance and Sexual Activity   Alcohol use: No   Drug use: No   Sexual activity: Never  Birth control/protection: Surgical, Post-menopausal  Other Topics Concern   Not on file  Social History Narrative   Not on file   Social Determinants of Health   Financial Resource Strain: Low Risk  (02/17/2022)   Overall Financial Resource Strain (CARDIA)    Difficulty of Paying Living Expenses: Not hard at all  Food Insecurity: No Food Insecurity (02/17/2022)   Hunger Vital Sign    Worried About Running Out of Food in the Last Year: Never true    Ran Out of Food in the Last Year: Never true  Transportation Needs: No Transportation Needs (02/17/2022)   PRAPARE - Hydrologist (Medical): No    Lack of Transportation (Non-Medical): No  Physical Activity: Sufficiently Active (02/17/2022)   Exercise Vital Sign    Days of Exercise per Week: 3 days    Minutes of Exercise per Session: 60 min  Stress: No Stress Concern Present (02/17/2022)   [place]    Feeling of Stress : Not at all  Social Connections: Moderately Isolated (02/17/2022)   Social Connection and Isolation Panel [NHANES]    Frequency of Communication with Friends and Family: Three times a week    Frequency of Social Gatherings with Friends  and Family: Three times a week    Attends Religious Services: More than 4 times per year    Active Member of Clubs or Organizations: No    Attends Archivist Meetings: Never    Marital Status: Widowed  Intimate Partner Violence: Not At Risk (02/17/2022)   Humiliation, Afraid, Rape, and Kick questionnaire    Fear of Current or Ex-Partner: No    Emotionally Abused: No    Physically Abused: No    Sexually Abused: No    Observations/Objective: Vitals:   11/07/22 1332  Weight: 152 lb (68.9 kg)  Nontoxic appearance on video, speaking full sentences without facial droop or slurred speech.  No respiratory distress.  Equal facial movements.  All questions answered with understanding of plan expressed.   Assessment and Plan: Lightheadedness  Essential hypertension Dizziness/lightheadedness is improved on lower dose lisinopril, and likely in part due to less caffeinated beverages.  Continue to try to cut back to no more than 1 to 2 cups of coffee per day, maintain hydration with fluids.  Stay on 10 mg lisinopril.  RTC precautions if recurrent dizziness or new symptoms.  Otherwise keep follow-up as planned in July.  Follow Up Instructions: As above, scheduled in July.    I discussed the assessment and treatment plan with the patient. The patient was provided an opportunity to ask questions and all were answered. The patient agreed with the plan and demonstrated an understanding of the instructions.   The patient was advised to call back or seek an in-person evaluation if the symptoms worsen or if the condition fails to improve as anticipated.   Wendie Agreste, MD

## 2022-11-24 ENCOUNTER — Telehealth: Payer: Self-pay | Admitting: Family Medicine

## 2022-11-24 ENCOUNTER — Other Ambulatory Visit: Payer: Self-pay | Admitting: Family Medicine

## 2022-11-24 DIAGNOSIS — J309 Allergic rhinitis, unspecified: Secondary | ICD-10-CM

## 2022-11-24 DIAGNOSIS — R059 Cough, unspecified: Secondary | ICD-10-CM

## 2022-11-24 NOTE — Telephone Encounter (Signed)
Encourage patient to contact the pharmacy for refills or they can request refills through [facility]   WHAT PHARMACY WOULD THEY LIKE THIS SENT TO:  [facility] [place], [place] - [address] Phone: [phone]  MEDICATION NAME & DOSE: fluticasone (FLONASE) 50 MCG/ACT nasal spray  NOTES/COMMENTS FROM PATIENT: Came over as 16g should be 6mL      Front office please notify patient: It takes 48-72 hours to process rx refill requests Ask patient to call pharmacy to ensure rx is ready before heading there.

## 2022-11-28 ENCOUNTER — Other Ambulatory Visit: Payer: Self-pay

## 2022-11-28 DIAGNOSIS — R059 Cough, unspecified: Secondary | ICD-10-CM

## 2022-11-28 DIAGNOSIS — J309 Allergic rhinitis, unspecified: Secondary | ICD-10-CM

## 2022-11-28 MED ORDER — FLUTICASONE PROPIONATE 50 MCG/ACT NA SUSP: 2.0000 | Freq: Every day | NASAL | 0 refills | Status: DC

## 2022-11-30 ENCOUNTER — Encounter (HOSPITAL_COMMUNITY): Payer: Self-pay | Admitting: *Deleted

## 2022-11-30 ENCOUNTER — Other Ambulatory Visit: Payer: Self-pay

## 2022-11-30 ENCOUNTER — Ambulatory Visit (INDEPENDENT_AMBULATORY_CARE_PROVIDER_SITE_OTHER): Payer: 59

## 2022-11-30 ENCOUNTER — Ambulatory Visit (HOSPITAL_COMMUNITY)
Admission: EM | Admit: 2022-11-30 | Discharge: 2022-11-30 | Disposition: A | Discharge status: Home / Self Care | Payer: 59 | Attending: Internal Medicine | Admitting: Internal Medicine

## 2022-11-30 DIAGNOSIS — M79641 Pain in right hand: Secondary | ICD-10-CM | POA: Diagnosis not present

## 2022-11-30 NOTE — ED Provider Notes (Signed)
[facility]   [id number] 11/30/22 Arrival Time: 1101  ASSESSMENT & PLAN:  X-rays negative for fracture.  Sequela of old injury present.  Mild degenerative changes present.  1. Right hand pain    -Right hand pain and swelling x 1 day.  No fracture but there is some mild degenerative changes.  I suspect she flared up some arthritis with unknown trauma.  I will put her in a protective wrist brace today.  Recommended ice, elevation, Tylenol as needed for pain.  Advised return to urgent care if erythema or swelling of the arm occurs to rule out infection or DVT.  All questions answered she agrees to plan.  No orders of the defined types were placed in this encounter.    Discharge Instructions      Ret, Ice, compression, elevation of the nad for the next few days Tylenol as needed for pain Wrist brace for comfort Come back to be re-evaluated if swelling/redness develops up the whole arm        Reviewed expectations re: course of current medical issues. Questions answered. Outlined signs and symptoms indicating need for more acute intervention. Patient verbalized understanding. After Visit Summary given.   SUBJECTIVE: Pleasant 62 year old female comes to clinic to be evaluated for right hand pain.  She noticed swelling in her right hand at the wrist starting yesterday.  Unsure of any starting injury or trauma.  She does work outside a lot and may have hit it on something but she cannot recall.  She wonders if a bug bit her.  She denies any numbness or tingling or weakness in the hand.  No erythema.  No erythema extending up the arm.  No fevers.  She does have history of breaking this right wrist many years ago.  No LMP recorded. Patient is postmenopausal. Past Surgical History:  Procedure Laterality Date   cryotherphy  1986   TRANSURETHRAL RESECTION OF BLADDER TUMOR N/A 12/21/2020   Procedure: TRANSURETHRAL RESECTION OF BLADDER TUMOR (TURBT)WITH CYSTOSTOPY/ BILATERAL  RETROGRADE PYELOGRAM WITH LEFT STENT PLACEMENT;  Surgeon: Heloise Purpura, MD;  Location: [facility];  Service: Urology;  Laterality: N/A;  GENERAL ANESTHESIA WITHH PARALYSIS   TUBAL LIGATION       OBJECTIVE:  Vitals:   11/30/22 1247  BP: (!) 145/74  Pulse: 75  Resp: 18  Temp: 98 F (36.7 C)  SpO2: 94%     Physical Exam Vitals and nursing note reviewed.  HENT:     Head: Normocephalic.  Cardiovascular:     Rate and Rhythm: Normal rate.  Pulmonary:     Effort: Pulmonary effort is normal.  Musculoskeletal:     Comments: R hand -mild soft tissue swelling at the palm and hand.  Tender to palpation over the ulnar aspect of the wrist and at the base of the fifth metacarpal.  Full range of motion of the wrist in all directions.  5/5 grip strength.  2+ radial pulse.  Capillary refill less than 2 seconds.  Median, radial, ulnar nerve function intact.  Neurological:     Mental Status: She is alert.      Labs: Results for orders placed or performed in visit on 10/27/22  Comprehensive metabolic panel  Result Value Ref Range   Sodium 137 135 - 145 mEq/L   Potassium 4.4 3.5 - 5.1 mEq/L   Chloride 97 96 - 112 mEq/L   CO2 31 19 - 32 mEq/L   Glucose, Bld 107 (H) 70 - 99 mg/dL   BUN 14  6 - 23 mg/dL   Creatinine, Ser 2.84 0.40 - 1.20 mg/dL   Total Bilirubin 0.2 0.2 - 1.2 mg/dL   Alkaline Phosphatase 68 39 - 117 U/L   AST 14 0 - 37 U/L   ALT 12 0 - 35 U/L   Total Protein 7.3 6.0 - 8.3 g/dL   Albumin 4.3 3.5 - 5.2 g/dL   GFR 13.24 >40.10 mL/min   Calcium 9.4 8.4 - 10.5 mg/dL  Lipid panel  Result Value Ref Range   Cholesterol 231 (H) 0 - 200 mg/dL   Triglycerides 272.5 (H) 0.0 - 149.0 mg/dL   HDL 36.64 >40.34 mg/dL   VLDL 74.2 (H) 0.0 - 59.5 mg/dL   Total CHOL/HDL Ratio 4    NonHDL 177.42   Hemoglobin A1c  Result Value Ref Range   Hgb A1c MFr Bld 6.5 4.6 - 6.5 %  CBC  Result Value Ref Range   WBC 9.1 4.0 - 10.5 K/uL   RBC 4.97 3.87 - 5.11 Mil/uL   Platelets 412.0 (H) 150.0 -  400.0 K/uL   Hemoglobin 14.5 12.0 - 15.0 g/dL   HCT 63.8 75.6 - 43.3 %   MCV 87.1 78.0 - 100.0 fl   MCHC 33.6 30.0 - 36.0 g/dL   RDW 29.5 18.8 - 41.6 %  LDL cholesterol, direct  Result Value Ref Range   Direct LDL 120.0 mg/dL   Labs Reviewed - No data to display  Imaging: DG Hand Complete Right  Result Date: 11/30/2022 CLINICAL DATA:  Pain and swelling EXAM: RIGHT HAND - COMPLETE 3+ VIEW COMPARISON:  None Available. FINDINGS: No recent fracture or dislocation is seen. No focal lytic lesions are seen. Deformity in the distal radius may be residual from previous injury. Small bony spurs are seen in first carpometacarpal joint, first metacarpophalangeal joint and some of the interphalangeal joints. There are no abnormal soft tissue calcifications or opaque foreign bodies. IMPRESSION: No recent fracture or dislocation is seen. No focal lytic lesions are seen. Electronically Signed   By: Ernie Avena M.D.   On: 11/30/2022 13:06     Allergies  Allergen Reactions   Codeine Nausea And Vomiting                                               Past Medical History:  Diagnosis Date   Acquired hallux valgus of left foot    Bladder tumor    Callous ulcer    Depression with anxiety    DEPRESSIVE DISORDER NOT ELSEWHERE CLASSIFIED    Diabetes mellitus without complication    [facility]ia 10/13/2020   Dyspnea    Foot deformity, acquired    Grief reaction    History of kidney stones    Hyperlipidemia    [facility]ia 10/13/2020   Hypertension    [facility]ia 10/13/2020   Myalgia and myositis, unspecified    Onychomycosis    Oral thrush    Pneumonia    4 months ago   Pre-diabetes    Rotator cuff syndrome of left shoulder    Rotator cuff tear, right    Shortness of breath    SHOULDER PAIN, RIGHT    Sleep apnea    [facility]reesia 10/13/2020   Sleep disorder    Urinary hesitancy     Social History   Socioeconomic History   Marital status: Widowed    Spouse name: Not  on file   Number of  children: Not on file   Years of education: Not on file   Highest education level: Not on file  Occupational History   Not on file  Tobacco Use   Smoking status: Every Day    Packs/day: .5    Types: Cigarettes   Smokeless tobacco: Never   Tobacco comments:    increased smoking again due to stress  Vaping Use   Vaping Use: Never used  Substance and Sexual Activity   Alcohol use: No   Drug use: No   Sexual activity: Never    Birth control/protection: Surgical, Post-menopausal  Other Topics Concern   Not on file  Social History Narrative   Not on file   Social Determinants of Health   Financial Resource Strain: Low Risk  (02/17/2022)   Overall Financial Resource Strain (CARDIA)    Difficulty of Paying Living Expenses: Not hard at all  Food Insecurity: No Food Insecurity (02/17/2022)   Hunger Vital Sign    Worried About Running Out of Food in the Last Year: Never true    Ran Out of Food in the Last Year: Never true  Transportation Needs: No Transportation Needs (02/17/2022)   PRAPARE - Administrator, Civil Service (Medical): No    Lack of Transportation (Non-Medical): No  Physical Activity: Sufficiently Active (02/17/2022)   Exercise Vital Sign    Days of Exercise per Week: 3 days    Minutes of Exercise per Session: 60 min  Stress: No Stress Concern Present (02/17/2022)   [employer] of Occupational Health - Occupational Stress Questionnaire    Feeling of Stress : Not at all  Social Connections: Moderately Isolated (02/17/2022)   Social Connection and Isolation Panel [NHANES]    Frequency of Communication with Friends and Family: Three times a week    Frequency of Social Gatherings with Friends and Family: Three times a week    Attends Religious Services: More than 4 times per year    Active Member of Clubs or Organizations: No    Attends Banker Meetings: Never    Marital Status: Widowed  Intimate Partner Violence: Not At Risk (02/17/2022)    Humiliation, Afraid, Rape, and Kick questionnaire    Fear of Current or Ex-Partner: No    Emotionally Abused: No    Physically Abused: No    Sexually Abused: No    Family History  Problem Relation Age of Onset   Heart disease Mother    Diabetes Mother    Cancer Mother        breast and ovarian   Stroke Mother    Breast cancer Mother        diagnosed in her 46's   Heart disease Father       Rocio Wolak, Baldemar Friday, MD 11/30/22 1336

## 2022-11-30 NOTE — Discharge Instructions (Signed)
Ret, Ice, compression, elevation of the nad for the next few days Tylenol as needed for pain Wrist brace for comfort Come back to be re-evaluated if swelling/redness develops up the whole arm

## 2022-11-30 NOTE — ED Triage Notes (Signed)
Pt reports she has swelling to Rt palm of hand that started yesterday. Pt thinks a possible insect bite may have caused the swelling.

## 2023-01-27 ENCOUNTER — Telehealth: Payer: Self-pay | Admitting: Family Medicine

## 2023-01-27 NOTE — Telephone Encounter (Signed)
We have discussed this request previously, including in November 2023, and March of this year.  Would recommend she discuss service animal need as well as specifics for that request with a psychologist/therapist. She had declined to meet with therapist previously.  If she would like to meet with therapist to move forward with that request, I am happy to place a referral.

## 2023-01-27 NOTE — Telephone Encounter (Signed)
Patient came into the office stating she needs the provider to give her a service dog letter for her anxiety. Please advise.   Also, patient would like a call with any questions, concerns.

## 2023-01-27 NOTE — Telephone Encounter (Signed)
Fwrd. To Dr.Greene for advise

## 2023-01-27 NOTE — Telephone Encounter (Signed)
Left vm to call office to discuss Dr.Greene concerns for referrals.

## 2023-01-30 ENCOUNTER — Other Ambulatory Visit: Payer: Self-pay | Admitting: Family Medicine

## 2023-01-30 DIAGNOSIS — R059 Cough, unspecified: Secondary | ICD-10-CM

## 2023-01-30 DIAGNOSIS — J309 Allergic rhinitis, unspecified: Secondary | ICD-10-CM

## 2023-01-30 NOTE — Telephone Encounter (Signed)
Pt states she will find another doctor. She states he(Dr.Greene) "never will write her anything". She states she needs this note now. She refused referral

## 2023-01-31 NOTE — Telephone Encounter (Addendum)
See prior notes.  In November 2023, at the end of visit she did ask for a letter for her dog as a support animal and was moving to a new location that had a pet fee. She did not have a therapist or counselor at that time and there was a plan to discuss this further at follow-up visit in 2 weeks.  Follow-up did not occur until March of this year.  At that time she was in a new living situation, happier with her current living situation and felt like she was overall doing okay with just situational anxiety.  Did not want to meet with therapist.  Follow-up call with Tonya noted.  I am sorry to see this.  I called patient to discuss the requested letter. No answer - left VM that I will try to reach her again tomorrow.   We certainly can discuss a letter regarding her pet and how that helps her depression/anxiety.  I am not sure if that will suffice for it being deemed a service animal as I would think typically that would need to be decided or discussed with psychiatry or psychology as we had talked about in the past.  Again, I do not mind completing a letter but just need some specifics of her symptoms and how her pet impacts or assists with those symptoms.  Will also try to clarify the statement of me not writing her anything and what that entails.  I understand if she does want to seek care with another provider but would like the opportunity to clarify those concerns.  Again I will try to reach her again tomorrow.

## 2023-02-01 NOTE — Telephone Encounter (Signed)
Called patient -no answer.  I left a message that I will contact her by MyChart, but certainly can talk over the phone at a time that is more convenient for her if she can let us know a better time to call.

## 2023-02-02 NOTE — Telephone Encounter (Signed)
I have placed this in your sign folder

## 2023-02-02 NOTE — Telephone Encounter (Signed)
This patient needed an APPOINTMENT to discuss this not to just drop off forms correct?

## 2023-02-02 NOTE — Telephone Encounter (Signed)
Pt has brought a form to be filled out by Dr Binnie Kand. She is needing this form by 11:00 tomorrow 6/28. Please advise pt when ready at [phone]

## 2023-02-02 NOTE — Telephone Encounter (Signed)
I can look at the form, but would likely need to review symptoms to provide specifics for that form. I did see her in March so if stable, should be able to review with a quick call. Let me look at form first.

## 2023-02-03 ENCOUNTER — Telehealth: Payer: Self-pay

## 2023-02-03 ENCOUNTER — Encounter: Payer: Self-pay | Admitting: Family Medicine

## 2023-02-03 NOTE — Telephone Encounter (Signed)
3:02 PM Repeat call placed  - no answer. Left VM that I am in office - if she arrives to pick up paperwork will review questions at that time.

## 2023-02-03 NOTE — Telephone Encounter (Signed)
4:50 PM Called patient.  On disability for shoulder issue - torn rotator cuff and breathing issues, but also with history of depression and anxiety and nervous breakdown in 2015.  She is requesting accomodation to have her dog with her in apartment. Dog helps to manage anxiety and depression, having dog with her helps her manage daily activities. Medication along with her pet at her side helps to manage anxiety.  Dog has served as an Chief of Staff. Paperwork reviewed and completed, if further specifics needed I am happy to refer her to psychiatry or psychology.    Paperwork placed in fax bin at back nurse station, okay to pick up that paperwork on Monday.  Please have her complete her section, sign the accommodations verification form at area of signature for application and/or resident, and obtain copy for me.  Let me know if there are any questions.

## 2023-02-03 NOTE — Telephone Encounter (Signed)
error 

## 2023-02-03 NOTE — Telephone Encounter (Signed)
Paperwork reviewed.  Animal application for apartment complex, Catering manager.  Reasonable accommodations/modification policy.  She is requesting paperwork for her pet as an assistance animal.  Her form has not been completed but questions of describing accommodation that she is requesting, whether she considers herself to have a disability, and to describe how to request accommodations necessary for her use enjoyment of the apartment community.  On my portion of accommodation verification form it does ask to specify whether or not she has a disability (or I don't know) and what manner this disability restricts the applicant resident activities that are central importance to her daily life, whether the accommodation requested is needed to live in his or her apartment community, have the accommodation will mail applicant in a resident to use enjoyed his apartment community and whether or not I would be willing to testify in court regarding information provided on form.  I called patient to review these forms and for further details. She does have a history of depression with anxiety treated with Lexapro. She did not answer call at 1:52 pm. Left message again that I needed to review some of her symptoms and details to complete this form. Will call back again this afternoon.

## 2023-02-06 NOTE — Telephone Encounter (Signed)
Called patient to inform her this is mostly ready, no answer, LM, placed in front pick up file

## 2023-02-15 ENCOUNTER — Other Ambulatory Visit: Payer: Self-pay

## 2023-02-15 DIAGNOSIS — I10 Essential (primary) hypertension: Secondary | ICD-10-CM

## 2023-02-15 DIAGNOSIS — R7303 Prediabetes: Secondary | ICD-10-CM

## 2023-02-15 DIAGNOSIS — E785 Hyperlipidemia, unspecified: Secondary | ICD-10-CM

## 2023-02-15 MED ORDER — LISINOPRIL 10 MG PO TABS
10.0000 mg | ORAL_TABLET | Freq: Every day | ORAL | 1 refills | Status: DC
Start: 2023-02-15 — End: 2023-06-28

## 2023-02-15 MED ORDER — METFORMIN HCL 500 MG PO TABS
500.0000 mg | ORAL_TABLET | Freq: Every day | ORAL | 1 refills | Status: DC
Start: 2023-02-15 — End: 2023-06-28

## 2023-02-15 MED ORDER — ATORVASTATIN CALCIUM 10 MG PO TABS
10.0000 mg | ORAL_TABLET | Freq: Every day | ORAL | 1 refills | Status: DC
Start: 2023-02-15 — End: 2023-03-14

## 2023-02-15 MED ORDER — LISINOPRIL 10 MG PO TABS
10.0000 mg | ORAL_TABLET | Freq: Every day | ORAL | 1 refills | Status: DC
Start: 2023-02-15 — End: 2023-02-15

## 2023-02-15 MED ORDER — ATORVASTATIN CALCIUM 10 MG PO TABS
10.0000 mg | ORAL_TABLET | Freq: Every day | ORAL | 11 refills | Status: DC
Start: 2023-02-15 — End: 2023-02-15

## 2023-02-15 MED ORDER — METFORMIN HCL 500 MG PO TABS
500.0000 mg | ORAL_TABLET | Freq: Every day | ORAL | 11 refills | Status: DC
Start: 2023-02-15 — End: 2023-02-15

## 2023-02-15 MED ORDER — HYDROCHLOROTHIAZIDE 12.5 MG PO CAPS
12.5000 mg | ORAL_CAPSULE | Freq: Every day | ORAL | 11 refills | Status: DC
Start: 2023-02-15 — End: 2023-02-15

## 2023-02-15 MED ORDER — HYDROCHLOROTHIAZIDE 12.5 MG PO CAPS
12.5000 mg | ORAL_CAPSULE | Freq: Every day | ORAL | 1 refills | Status: DC
Start: 2023-02-15 — End: 2023-06-28

## 2023-03-01 ENCOUNTER — Ambulatory Visit (INDEPENDENT_AMBULATORY_CARE_PROVIDER_SITE_OTHER): Payer: Medicare HMO | Admitting: *Deleted

## 2023-03-01 DIAGNOSIS — Z0000 Encounter for general adult medical examination without abnormal findings: Secondary | ICD-10-CM | POA: Diagnosis not present

## 2023-03-01 NOTE — Patient Instructions (Signed)
Vicki [name] , Thank you for taking time to come for your Medicare Wellness Visit. I appreciate your ongoing commitment to your health goals. Please review the following plan we discussed and let me know if I can assist you in the future.   Screening recommendations/referrals: Colonoscopy: up to date Mammogram: up to date Recommended yearly ophthalmology/optometry visit for glaucoma screening and checkup Recommended yearly dental visit for hygiene and checkup  Vaccinations: Influenza vaccine: up to date  Tdap vaccine: Education provided Shingles vaccine: Education provided    Advanced directives: Education provided      Preventive Care 40-64 years and Older, Female Preventive care refers to lifestyle choices and visits with your health care provider that can promote health and wellness. What does preventive care include? A yearly physical exam. This is also called an annual well check. Dental exams once or twice a year. Routine eye exams. Ask your health care provider how often you should have your eyes checked. Personal lifestyle choices, including: Daily care of your teeth and gums. Regular physical activity. Eating a healthy diet. Avoiding tobacco and drug use. Limiting alcohol use. Practicing safe sex. Taking low-dose aspirin every day. Taking vitamin and mineral supplements as recommended by your health care provider. What happens during an annual well check? The services and screenings done by your health care provider during your annual well check will depend on your age, overall health, lifestyle risk factors, and family history of disease. Counseling  Your health care provider may ask you questions about your: Alcohol use. Tobacco use. Drug use. Emotional well-being. Home and relationship well-being. Sexual activity. Eating habits. History of falls. Memory and ability to understand (cognition). Work and work Astronomer. Reproductive health. Screening  You  may have the following tests or measurements: Height, weight, and BMI. Blood pressure. Lipid and cholesterol levels. These may be checked every 5 years, or more frequently if you are over 82 years old. Skin check. Lung cancer screening. You may have this screening every year starting at age 12 if you have a 30-pack-year history of smoking and currently smoke or have quit within the past 15 years. Fecal occult blood test (FOBT) of the stool. You may have this test every year starting at age 78. Flexible sigmoidoscopy or colonoscopy. You may have a sigmoidoscopy every 5 years or a colonoscopy every 10 years starting at age 2. Hepatitis C blood test. Hepatitis B blood test. Sexually transmitted disease (STD) testing. Diabetes screening. This is done by checking your blood sugar (glucose) after you have not eaten for a while (fasting). You may have this done every 1-3 years. Bone density scan. This is done to screen for osteoporosis. You may have this done starting at age 39. Mammogram. This may be done every 1-2 years. Talk to your health care provider about how often you should have regular mammograms. Talk with your health care provider about your test results, treatment options, and if necessary, the need for more tests. Vaccines  Your health care provider may recommend certain vaccines, such as: Influenza vaccine. This is recommended every year. Tetanus, diphtheria, and acellular pertussis (Tdap, Td) vaccine. You may need a Td booster every 10 years. Zoster vaccine. You may need this after age 79. Pneumococcal 13-valent conjugate (PCV13) vaccine. One dose is recommended after age 80. Pneumococcal polysaccharide (PPSV23) vaccine. One dose is recommended after age 43. Talk to your health care provider about which screenings and vaccines you need and how often you need them. This information is not intended  to replace advice given to you by your health care provider. Make sure you discuss any  questions you have with your health care provider. Document Released: 08/21/2015 Document Revised: 04/13/2016 Document Reviewed: 05/26/2015 Elsevier Interactive Patient Education  2017 [employer].  Fall Prevention in the Home Falls can cause injuries. They can happen to people of all ages. There are many things you can do to make your home safe and to help prevent falls. What can I do on the outside of my home? Regularly fix the edges of walkways and driveways and fix any cracks. Remove anything that might make you trip as you walk through a door, such as a raised step or threshold. Trim any bushes or trees on the path to your home. Use bright outdoor lighting. Clear any walking paths of anything that might make someone trip, such as rocks or tools. Regularly check to see if handrails are loose or broken. Make sure that both sides of any steps have handrails. Any raised decks and porches should have guardrails on the edges. Have any leaves, snow, or ice cleared regularly. Use sand or salt on walking paths during winter. Clean up any spills in your garage right away. This includes oil or grease spills. What can I do in the bathroom? Use night lights. Install grab bars by the toilet and in the tub and shower. Do not use towel bars as grab bars. Use non-skid mats or decals in the tub or shower. If you need to sit down in the shower, use a plastic, non-slip stool. Keep the floor dry. Clean up any water that spills on the floor as soon as it happens. Remove soap buildup in the tub or shower regularly. Attach bath mats securely with double-sided non-slip rug tape. Do not have throw rugs and other things on the floor that can make you trip. What can I do in the bedroom? Use night lights. Make sure that you have a light by your bed that is easy to reach. Do not use any sheets or blankets that are too big for your bed. They should not hang down onto the floor. Have a firm chair that has side  arms. You can use this for support while you get dressed. Do not have throw rugs and other things on the floor that can make you trip. What can I do in the kitchen? Clean up any spills right away. Avoid walking on wet floors. Keep items that you use a lot in easy-to-reach places. If you need to reach something above you, use a strong step stool that has a grab bar. Keep electrical cords out of the way. Do not use floor polish or wax that makes floors slippery. If you must use wax, use non-skid floor wax. Do not have throw rugs and other things on the floor that can make you trip. What can I do with my stairs? Do not leave any items on the stairs. Make sure that there are handrails on both sides of the stairs and use them. Fix handrails that are broken or loose. Make sure that handrails are as long as the stairways. Check any carpeting to make sure that it is firmly attached to the stairs. Fix any carpet that is loose or worn. Avoid having throw rugs at the top or bottom of the stairs. If you do have throw rugs, attach them to the floor with carpet tape. Make sure that you have a light switch at the top of the stairs and  the bottom of the stairs. If you do not have them, ask someone to add them for you. What else can I do to help prevent falls? Wear shoes that: Do not have high heels. Have rubber bottoms. Are comfortable and fit you well. Are closed at the toe. Do not wear sandals. If you use a stepladder: Make sure that it is fully opened. Do not climb a closed stepladder. Make sure that both sides of the stepladder are locked into place. Ask someone to hold it for you, if possible. Clearly mark and make sure that you can see: Any grab bars or handrails. First and last steps. Where the edge of each step is. Use tools that help you move around (mobility aids) if they are needed. These include: Canes. Walkers. Scooters. Crutches. Turn on the lights when you go into a dark area.  Replace any light bulbs as soon as they burn out. Set up your furniture so you have a clear path. Avoid moving your furniture around. If any of your floors are uneven, fix them. If there are any pets around you, be aware of where they are. Review your medicines with your doctor. Some medicines can make you feel dizzy. This can increase your chance of falling. Ask your doctor what other things that you can do to help prevent falls. This information is not intended to replace advice given to you by your health care provider. Make sure you discuss any questions you have with your health care provider. Document Released: 05/21/2009 Document Revised: 12/31/2015 Document Reviewed: 08/29/2014 Elsevier Interactive Patient Education  2017 [employer].

## 2023-03-01 NOTE — Progress Notes (Signed)
Subjective:   Vicki [name] is a 62 y.o. female who presents for Medicare Annual (Subsequent) preventive examination.  Visit Complete: Virtual  I connected with  Vicki [name] on 03/01/23 by a audio enabled telemedicine application and verified that I am speaking with the correct person using two identifiers.  Patient Location: Home  Provider Location: Home Office  I discussed the limitations of evaluation and management by telemedicine. The patient expressed understanding and agreed to proceed.  Patient Medicare AWV questionnaire was completed by the patient on ; I have confirmed that all information answered by patient is correct and no changes since this date.  Patient not in clinic to obtain vitals.  Review of Systems     Cardiac Risk Factors include: advanced age (>85men, >44 women);smoking/ tobacco exposure;diabetes mellitus;family history of premature cardiovascular disease     Objective:    Today's Vitals   03/01/23 1134  PainSc: 7    There is no height or weight on file to calculate BMI.     03/01/2023   11:37 AM 02/17/2022    3:16 PM 12/16/2020   10:02 AM 04/30/2020   11:05 AM 09/26/2018    8:28 AM 09/25/2018    9:54 PM 01/19/2017   11:18 AM  Advanced Directives  Does Patient Have a Medical Advance Directive? No No No No No No No  Would patient like information on creating a medical advance directive? No - Patient declined No - Patient declined  Yes (ED - Information included in AVS) No - Patient declined No - Patient declined No - Patient declined    Current Medications (verified) Outpatient Encounter Medications as of 03/01/2023  Medication Sig   ASPIRIN 81 PO Take 81 mg by mouth daily.   atorvastatin (LIPITOR) 10 MG tablet Take 1 tablet (10 mg total) by mouth daily.   budesonide-formoterol (SYMBICORT) 160-4.5 MCG/ACT inhaler Inhale 2 puffs into the lungs 2 (two) times daily.   Calcium Carb-Cholecalciferol (CALCIUM 600/VITAMIN D3 PO) Take 1 tablet by  mouth daily.   diclofenac Sodium (VOLTAREN) 1 % GEL Apply 4gms topically four times a day as needed   escitalopram (LEXAPRO) 20 MG tablet Take 1 tablet (20 mg total) by mouth daily.   fluticasone (FLONASE) 50 MCG/ACT nasal spray Use 2 sprays into each nostril every day   hydrochlorothiazide (MICROZIDE) 12.5 MG capsule Take 1 capsule (12.5 mg total) by mouth daily.   hydrOXYzine (ATARAX) 10 MG tablet Take one-half to one tablet by mouth at bedtime only as needed   lisinopril (ZESTRIL) 10 MG tablet Take 1 tablet (10 mg total) by mouth daily.   metFORMIN (GLUCOPHAGE) 500 MG tablet Take 1 tablet (500 mg total) by mouth daily.   Omega-3 Fatty Acids (FISH OIL) 1000 MG CAPS Take by mouth.   vitamin E 180 MG (400 UNITS) capsule Take 400 Units by mouth daily.   Facility-Administered Encounter Medications as of 03/01/2023  Medication   gemcitabine (GEMZAR) chemo syringe for bladder instillation 2,000 mg    Allergies (verified) Codeine   History: Past Medical History:  Diagnosis Date   Acquired hallux valgus of left foot    Bladder tumor    Callous ulcer (HCC)    Depression with anxiety    DEPRESSIVE DISORDER NOT ELSEWHERE CLASSIFIED    Diabetes mellitus without complication (HCC)    [facility] 10/13/2020   Dyspnea    Foot deformity, acquired    Grief reaction    History of kidney stones    Hyperlipidemia  [facility] 10/13/2020   Hypertension    [facility] 10/13/2020   Myalgia and myositis, unspecified    Onychomycosis    Oral thrush    Pneumonia    4 months ago   Pre-diabetes    Rotator cuff syndrome of left shoulder    Rotator cuff tear, right    Shortness of breath    SHOULDER PAIN, RIGHT    Sleep apnea    [employer]ia 10/13/2020   Sleep disorder    Urinary hesitancy    Past Surgical History:  Procedure Laterality Date   cryotherphy  1986   TRANSURETHRAL RESECTION OF BLADDER TUMOR N/A 12/21/2020   Procedure: TRANSURETHRAL RESECTION OF BLADDER TUMOR (TURBT)WITH CYSTOSTOPY/  BILATERAL RETROGRADE PYELOGRAM WITH LEFT STENT PLACEMENT;  Surgeon: Heloise Purpura, MD;  Location: [facility];  Service: Urology;  Laterality: N/A;  GENERAL ANESTHESIA WITHH PARALYSIS   TUBAL LIGATION     Family History  Problem Relation Age of Onset   Heart disease Mother    Diabetes Mother    Cancer Mother        breast and ovarian   Stroke Mother    Breast cancer Mother        diagnosed in her 42's   Heart disease Father    Social History   Socioeconomic History   Marital status: Widowed    Spouse name: Not on file   Number of children: Not on file   Years of education: Not on file   Highest education level: Not on file  Occupational History   Not on file  Tobacco Use   Smoking status: Every Day    Current packs/day: 0.50    Types: Cigarettes   Smokeless tobacco: Never   Tobacco comments:    increased smoking again due to stress  Vaping Use   Vaping status: Never Used  Substance and Sexual Activity   Alcohol use: No   Drug use: No   Sexual activity: Never    Birth control/protection: Surgical, Post-menopausal  Other Topics Concern   Not on file  Social History Narrative   Not on file   Social Determinants of Health   Financial Resource Strain: Low Risk  (03/01/2023)   Overall Financial Resource Strain (CARDIA)    Difficulty of Paying Living Expenses: Not hard at all  Food Insecurity: No Food Insecurity (03/01/2023)   Hunger Vital Sign    Worried About Running Out of Food in the Last Year: Never true    Ran Out of Food in the Last Year: Never true  Transportation Needs: No Transportation Needs (03/01/2023)   PRAPARE - Administrator, Civil Service (Medical): No    Lack of Transportation (Non-Medical): No  Physical Activity: Insufficiently Active (03/01/2023)   Exercise Vital Sign    Days of Exercise per Week: 2 days    Minutes of Exercise per Session: 40 min  Stress: Stress Concern Present (03/01/2023)   [employer] of Occupational Health -  Occupational Stress Questionnaire    Feeling of Stress : Rather much  Social Connections: Moderately Integrated (03/01/2023)   Social Connection and Isolation Panel [NHANES]    Frequency of Communication with Friends and Family: Twice a week    Frequency of Social Gatherings with Friends and Family: Once a week    Attends Religious Services: More than 4 times per year    Active Member of [employer] or Organizations: Yes    Attends Banker Meetings: More than 4 times per year  Marital Status: Widowed    Tobacco Counseling Ready to quit: Not Answered Counseling given: Not Answered Tobacco comments: increased smoking again due to stress   Clinical Intake:  Pre-visit preparation completed: Yes  Pain : 0-10 Pain Score: 7  Pain Type: Chronic pain Pain Location: Knee Pain Orientation: Right Pain Descriptors / Indicators: Constant, Burning, Aching, Dull Pain Onset: More than a month ago Pain Frequency: Constant Pain Relieving Factors: tylenol  Pain Relieving Factors: tylenol  Diabetes: Yes CBG done?: No Did pt. bring in CBG monitor from home?: No  How often do you need to have someone help you when you read instructions, pamphlets, or other written materials from your doctor or pharmacy?: 1 - Never  Interpreter Needed?: No  Information entered by :: Remi Haggard LPN   Activities of Daily Living    03/01/2023   11:38 AM  In your present state of health, do you have any difficulty performing the following activities:  Hearing? 0  Vision? 0  Difficulty concentrating or making decisions? 0  Walking or climbing stairs? 1  Dressing or bathing? 0  Doing errands, shopping? 0  Preparing Food and eating ? N  Using the Toilet? N  In the past six months, have you accidently leaked urine? Y  Do you have problems with loss of bowel control? N  Managing your Medications? N  Managing your Finances? N  Housekeeping or managing your Housekeeping? N    Patient Care  Team: Shade Flood, MD as PCP - General (Family Medicine)  Indicate any recent Medical Services you may have received from other than [facility] providers in the past year (date may be approximate).     Assessment:   This is a routine wellness examination for Aideen.  Hearing/Vision screen Hearing Screening - Comments:: No trouble hearing Vision Screening - Comments:: Not up to date  [facility]  Dietary issues and exercise activities discussed:     Goals Addressed             This Visit's Progress    Weight (lb) < 200 lb (90.7 kg)         Depression Screen    03/01/2023   11:42 AM 11/07/2022    1:35 PM 10/27/2022    2:24 PM 06/23/2022    1:32 PM 05/18/2022    2:58 PM 03/17/2022    2:21 PM 03/17/2022    2:18 PM  PHQ 2/9 Scores  PHQ - 2 Score 6 3 4 4 2 2  0  PHQ- 9 Score 11 7 10 7 9 8      Fall Risk    03/01/2023   11:36 AM 11/07/2022    1:34 PM 10/27/2022    2:24 PM 06/23/2022    1:32 PM 05/18/2022    2:58 PM  Fall Risk   Falls in the past year? 0  0 0 0  Number falls in past yr: 0 0 0 0 0  Injury with Fall? 0 0 0 0 0  Risk for fall due to :  No Fall Risks No Fall Risks No Fall Risks No Fall Risks  Follow up Falls evaluation completed;Education provided;Falls prevention discussed  Falls evaluation completed Falls evaluation completed Falls evaluation completed    MEDICARE RISK AT HOME:  Medicare Risk at Home - 03/01/23 1136     Any stairs in or around the home? Yes    If so, are there any without handrails? Yes    Home free of loose throw rugs in walkways, pet  beds, electrical cords, etc? Yes    Adequate lighting in your home to reduce risk of falls? Yes    Life alert? No    Use of a cane, walker or w/c? No    Grab bars in the bathroom? No    Shower chair or bench in shower? Yes    Elevated toilet seat or a handicapped toilet? No             TIMED UP AND GO:  Was the test performed?  No    Cognitive Function:        03/01/2023   11:39 AM 04/30/2020    11:01 AM  6CIT Screen  What Year? 0 points 0 points  What month? 0 points 0 points  What time? 0 points 0 points  Count back from 20 0 points 0 points  Months in reverse 4 points 0 points  Repeat phrase 2 points 0 points  Total Score 6 points 0 points    Immunizations Immunization History  Administered Date(s) Administered   Influenza Split 05/29/2012   Influenza Whole 06/11/2010   PFIZER(Purple Top)SARS-COV-2 Vaccination 04/14/2020, 05/05/2020    TDAP status: Due, Education has been provided regarding the importance of this vaccine. Advised may receive this vaccine at local pharmacy or Health Dept. Aware to provide a copy of the vaccination record if obtained from local pharmacy or Health Dept. Verbalized acceptance and understanding.  Flu Vaccine status: Up to date   Covid-19 vaccine status: Information provided on how to obtain vaccines.   Qualifies for Shingles Vaccine? Yes   Zostavax completed No   Shingrix Completed?: No.    Education has been provided regarding the importance of this vaccine. Patient has been advised to call insurance company to determine out of pocket expense if they have not yet received this vaccine. Advised may also receive vaccine at local pharmacy or Health Dept. Verbalized acceptance and understanding.  Screening Tests Health Maintenance  Topic Date Due   DTaP/Tdap/Td (1 - Tdap) Never done   MAMMOGRAM  01/05/2023   COVID-19 Vaccine (3 - Pfizer risk series) 03/17/2023 (Originally 06/02/2020)   Zoster Vaccines- Shingrix (1 of 2) 06/01/2023 (Originally [birth year]27/1979)   PAP SMEAR-Modifier  08/03/2023 (Originally 07/19/2021)   Hepatitis C Screening  02/29/2024 (Originally 11/26/1978)   INFLUENZA VACCINE  03/09/2023   Medicare Annual Wellness (AWV)  02/29/2024   Colonoscopy  06/16/2026   HIV Screening  Completed   HPV VACCINES  Aged Out    Health Maintenance  Health Maintenance Due  Topic Date Due   DTaP/Tdap/Td (1 - Tdap) Never done   MAMMOGRAM   01/05/2023    Colorectal cancer screening: Type of screening: Colonoscopy. Completed 2023. Repeat every 3 years  Mammogram status: Completed  . Repeat every year    Lung Cancer Screening: (Low Dose CT Chest recommended if Age 23-80 years, 20 pack-year currently smoking OR have quit w/in 15years.) does qualify.   Lung Cancer Screening Referral: last one 2023   Additional Screening:  Hepatitis C Screening: does not qualify;  Vision Screening: Recommended annual ophthalmology exams for early detection of glaucoma and other disorders of the eye. Is the patient up to date with their annual eye exam?  Yes  Who is the provider or what is the name of the office in which the patient attends annual eye exams? [facility]  If pt is not established with a provider, would they like to be referred to a provider to establish care? No .   Dental Screening:  Recommended annual dental exams for proper oral hygiene    Community Resource Referral / Chronic Care Management: CRR required this visit?  No   CCM required this visit?  No     Plan:     I have personally reviewed and noted the following in the patient's chart:   Medical and social history Use of alcohol, tobacco or illicit drugs  Current medications and supplements including opioid prescriptions. Patient is not currently taking opioid prescriptions. Functional ability and status Nutritional status Physical activity Advanced directives List of other physicians Hospitalizations, surgeries, and ER visits in previous 12 months Vitals Screenings to include cognitive, depression, and falls Referrals and appointments  In addition, I have reviewed and discussed with patient certain preventive protocols, quality metrics, and best practice recommendations. A written personalized care plan for preventive services as well as general preventive health recommendations were provided to patient.     Remi Haggard, LPN   [id number]   After Visit  Summary: (MyChart) Due to this being a telephonic visit, the after visit summary with patients personalized plan was offered to patient via MyChart   Nurse Notes:

## 2023-03-13 ENCOUNTER — Telehealth: Payer: Self-pay | Admitting: Family Medicine

## 2023-03-13 NOTE — Telephone Encounter (Signed)
Encourage patient to contact the pharmacy for refills or they can request refills through [facility]  (Please schedule appointment if patient has not been seen in over a year)    WHAT PHARMACY WOULD THEY LIKE THIS SENT TO: [facility]  MEDICATION NAME & DOSE: Atorvastatin 10mg   NOTES/COMMENTS FROM PATIENT:      Front office please notify patient: It takes 48-72 hours to process rx refill requests Ask patient to call pharmacy to ensure rx is ready before heading there.

## 2023-03-14 ENCOUNTER — Other Ambulatory Visit: Payer: Self-pay

## 2023-03-14 DIAGNOSIS — E785 Hyperlipidemia, unspecified: Secondary | ICD-10-CM

## 2023-03-14 MED ORDER — ATORVASTATIN CALCIUM 10 MG PO TABS
10.0000 mg | ORAL_TABLET | Freq: Every day | ORAL | 1 refills | Status: DC
Start: 2023-03-14 — End: 2023-06-28

## 2023-03-14 NOTE — Telephone Encounter (Signed)
Refill sent.

## 2023-03-22 DIAGNOSIS — R0602 Shortness of breath: Secondary | ICD-10-CM | POA: Diagnosis not present

## 2023-03-22 DIAGNOSIS — J449 Chronic obstructive pulmonary disease, unspecified: Secondary | ICD-10-CM | POA: Diagnosis not present

## 2023-03-22 DIAGNOSIS — J209 Acute bronchitis, unspecified: Secondary | ICD-10-CM | POA: Diagnosis not present

## 2023-03-23 ENCOUNTER — Other Ambulatory Visit: Payer: Self-pay

## 2023-03-23 ENCOUNTER — Telehealth: Payer: Self-pay | Admitting: Family Medicine

## 2023-03-23 DIAGNOSIS — F418 Other specified anxiety disorders: Secondary | ICD-10-CM

## 2023-03-23 MED ORDER — ESCITALOPRAM OXALATE 20 MG PO TABS
20.0000 mg | ORAL_TABLET | Freq: Every day | ORAL | 0 refills | Status: DC
Start: 2023-03-23 — End: 2023-03-23

## 2023-03-23 MED ORDER — ESCITALOPRAM OXALATE 20 MG PO TABS
20.0000 mg | ORAL_TABLET | Freq: Every day | ORAL | 1 refills | Status: DC
Start: 2023-03-23 — End: 2023-06-28

## 2023-03-23 NOTE — Telephone Encounter (Signed)
Caller name: Miciah Maginnis  On DPR?: Yes  Call back number: [phone] (home)  Provider they see: Shade Flood, MD  Reason for call:   Pt called confused about meds she got some are missing she had a list can't find it and some of the pill bottles. Needs a call from the CMA

## 2023-03-23 NOTE — Telephone Encounter (Signed)
Called patient and discussed.

## 2023-04-05 DIAGNOSIS — J449 Chronic obstructive pulmonary disease, unspecified: Secondary | ICD-10-CM | POA: Diagnosis not present

## 2023-04-05 DIAGNOSIS — R0602 Shortness of breath: Secondary | ICD-10-CM | POA: Diagnosis not present

## 2023-05-09 ENCOUNTER — Telehealth: Payer: Self-pay | Admitting: Family Medicine

## 2023-05-09 NOTE — Telephone Encounter (Signed)
[employer]: Care Plan  Charge sheet attached placed in front bin

## 2023-05-09 NOTE — Telephone Encounter (Signed)
Placed in folder at [employer].

## 2023-05-10 NOTE — Telephone Encounter (Signed)
Paperwork completed and placed in fax bin at back nurse station

## 2023-06-01 ENCOUNTER — Telehealth: Payer: Self-pay

## 2023-06-01 ENCOUNTER — Encounter: Payer: Self-pay | Admitting: Family Medicine

## 2023-06-01 NOTE — Telephone Encounter (Addendum)
Letter completed, let me know if further information needed. placed in fax bin at back nurse station

## 2023-06-01 NOTE — Telephone Encounter (Signed)
Pt needs a letter stating she need accommodations for her dog as an ESA states she brought you a form to be filled out several months ago, notes it needs to be on our letter head and faxed to [phone] in order for her to be aloud to keep her dog in her home with them Notes you agreed to this at a previous visit and  Need this done ASAP.

## 2023-06-02 NOTE — Telephone Encounter (Signed)
Faxed letter to the designated number provided by the patient, called and also informed patient this was ready and has been faxed. Offered patient a hard copy which was declined and informed the patient she can get a copy through her MyChart if she wishes to later.  Pt thanked [place] and confirmed appt for 06/12/23

## 2023-06-12 ENCOUNTER — Ambulatory Visit: Payer: Medicare HMO | Admitting: Family Medicine

## 2023-06-28 ENCOUNTER — Ambulatory Visit: Payer: Medicare HMO | Admitting: Family Medicine

## 2023-06-28 ENCOUNTER — Encounter: Payer: Self-pay | Admitting: Family Medicine

## 2023-06-28 VITALS — BP 122/70 | HR 65 | Temp 98.8°F | Ht 64.0 in | Wt 154.8 lb

## 2023-06-28 DIAGNOSIS — B351 Tinea unguium: Secondary | ICD-10-CM | POA: Diagnosis not present

## 2023-06-28 DIAGNOSIS — F418 Other specified anxiety disorders: Secondary | ICD-10-CM | POA: Diagnosis not present

## 2023-06-28 DIAGNOSIS — E785 Hyperlipidemia, unspecified: Secondary | ICD-10-CM | POA: Diagnosis not present

## 2023-06-28 DIAGNOSIS — F439 Reaction to severe stress, unspecified: Secondary | ICD-10-CM

## 2023-06-28 DIAGNOSIS — M21612 Bunion of left foot: Secondary | ICD-10-CM | POA: Diagnosis not present

## 2023-06-28 DIAGNOSIS — I10 Essential (primary) hypertension: Secondary | ICD-10-CM

## 2023-06-28 DIAGNOSIS — G4733 Obstructive sleep apnea (adult) (pediatric): Secondary | ICD-10-CM | POA: Diagnosis not present

## 2023-06-28 DIAGNOSIS — M2012 Hallux valgus (acquired), left foot: Secondary | ICD-10-CM | POA: Diagnosis not present

## 2023-06-28 DIAGNOSIS — R7303 Prediabetes: Secondary | ICD-10-CM

## 2023-06-28 MED ORDER — HYDROCHLOROTHIAZIDE 12.5 MG PO CAPS: 12.5000 mg | ORAL_CAPSULE | Freq: Every day | ORAL | 1 refills | Status: DC

## 2023-06-28 MED ORDER — ATORVASTATIN CALCIUM 10 MG PO TABS: 10.0000 mg | ORAL_TABLET | Freq: Every day | ORAL | 1 refills | Status: DC

## 2023-06-28 MED ORDER — LISINOPRIL 10 MG PO TABS: 10.0000 mg | ORAL_TABLET | Freq: Every day | ORAL | 1 refills | Status: DC

## 2023-06-28 MED ORDER — HYDROXYZINE HCL 10 MG PO TABS: 10.0000 mg | ORAL_TABLET | Freq: Every evening | ORAL | 11 refills | Status: DC | PRN

## 2023-06-28 MED ORDER — ESCITALOPRAM OXALATE 20 MG PO TABS: 20.0000 mg | ORAL_TABLET | Freq: Every day | ORAL | 1 refills | Status: DC

## 2023-06-28 MED ORDER — METFORMIN HCL 500 MG PO TABS: 500.0000 mg | ORAL_TABLET | Freq: Every day | ORAL | 1 refills | Status: DC

## 2023-06-28 NOTE — Progress Notes (Signed)
Subjective:  Patient ID: Vicki [name], female    DOB: 12/12/1960  Age: 62 y.o. MRN: [id number]  CC:  Chief Complaint  Patient presents with   Foot Pain    Pt reports Lt foot has a bunyan causing her some pain has been present for several years but did not used to cause pain     HPI Vicki [name] presents for   L foot pain: Past 2 months. Bunion has been present for 10 years, but more painful. Sore to stand on it. Pain can cause her to [name] balance. No falls/injuries. Podiatry eval few years ago - plan for follow up if more symptomatic. Would like to be seen at different practice. No gout history. Tx - none  would like to discuss toenail fungus with podiatry.   Depression with anxiety Treated with Lexapro 20 mg daily.  Support animal at home has been helpful, letter has been provided recently for her apartment complex.  Buspirone was not helpful in the past. Doing well on lexapro. Hx of OSA - not using CPAP. Recommended sleep specialist follow up - declined.  Has been taking hydroxyzine to sleep - 1/2-1 pill.  No new side effects. Sleepy at times.     06/28/2023    1:38 PM 03/01/2023   11:42 AM 11/07/2022    1:35 PM 10/27/2022    2:24 PM 06/23/2022    1:32 PM  Depression screen PHQ 2/9  Decreased Interest 1 3 3 2 1   Down, Depressed, Hopeless 1 3 0 2 3  PHQ - 2 Score 2 6 3 4 4   Altered sleeping 1 2 3 2 3   Tired, decreased energy 1 2 1 2  0  Change in appetite 1 0 0 0 0  Feeling bad or failure about yourself  1 0 0 0 0  Trouble concentrating 0 1 0 0 0  Moving slowly or fidgety/restless 0 0  2 0  Suicidal thoughts 0 0 0 0 0  PHQ-9 Score 6 11 7 10 7   Difficult doing work/chores Not difficult at all Somewhat difficult Somewhat difficult       Prediabetes/diabetes Variable readings from 6.1-6.5 over the past few years.  Treated with metformin 500 mg daily.  As above, has not been tested since March. No new side effects with daily metformin. Random CBG around 129.    Lab Results  Component Value Date   HGBA1C 6.5 10/27/2022   Wt Readings from Last 3 Encounters:  06/28/23 154 lb 12.8 oz (70.2 kg)  11/07/22 152 lb (68.9 kg)  10/27/22 154 lb (69.9 kg)   Hypertension: Discussed in April.  Plan for follow-up in July, has not been seen since virtual visit in April.  At that time lightheadedness/dizziness was improving on 10 mg lisinopril and blood pressure was 120/70-82.  Continued on hydrochlorothiazide 12.5 mg daily.  Had also decreased her caffeine intake.  More water intake. Still doing ok - no light lightheadness or dizziness. Still smoking - considering calling 1-800-quit-now - not ready yet. Smoking since age 79 - about a ppd - 54 years. Has yearly lung CA screening with pulmonologist at [facility] readings: 120/80 range.  BP Readings from Last 3 Encounters:  06/28/23 122/70  11/30/22 (!) 145/74  10/27/22 122/66   Lab Results  Component Value Date   CREATININE 0.71 10/27/2022   Hyperlipidemia: Lipitor 10 mg daily. No new side effects/myalgias. Fasting today  Lab Results  Component Value Date   CHOL 231 (H)  10/27/2022   HDL 53.40 10/27/2022   LDLCALC 100 (H) 11/04/2021   LDLDIRECT 120.0 10/27/2022   TRIG 209.0 (H) 10/27/2022   CHOLHDL 4 10/27/2022   Lab Results  Component Value Date   ALT 12 10/27/2022   AST 14 10/27/2022   ALKPHOS 68 10/27/2022   BILITOT 0.2 10/27/2022    Declines flu vaccine.   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   Influenza A 09/26/2018   COPD with acute exacerbation (HCC) 09/26/2018   Neck pain 02/05/2016   Leg swelling 01/19/2016   Dry cough 02/11/2015   Fatigue 02/11/2015   Prediabetes 10/29/2014   Polyuria 10/28/2014   Hyperglycemia 10/28/2014   Left breast mass 04/11/2014   Right hip pain 03/21/2014   Edema, lower extremity 03/21/2014   Low back pain 12/17/2013   Right knee pain 12/17/2013   Chest pain,  central 11/05/2013   Lateral epicondylitis of left elbow 11/05/2013   Obstructive chronic bronchitis without exacerbation (HCC) 08/23/2013   Wrist fracture, right 07/08/2013   Sciatica 05/28/2013   Grief reaction 04/02/2013   Myalgia and myositis, unspecified 03/07/2012   Rotator cuff syndrome of left shoulder 03/07/2012   Depression with anxiety 03/07/2012   Foot deformity, acquired 06/21/2011   Callous ulcer (HCC) 06/21/2011   Acquired hallux valgus of left foot 05/10/2011   Sleep disorder 02/04/2011   Rotator cuff tear, right 09/03/2010   SHOULDER PAIN, RIGHT 08/20/2010   TOBACCO ABUSE 06/11/2010   DEPRESSIVE DISORDER NOT ELSEWHERE CLASSIFIED 06/11/2010   Past Medical History:  Diagnosis Date   Acquired hallux valgus of left foot    Bladder tumor    Callous ulcer (HCC)    Depression with anxiety    DEPRESSIVE DISORDER NOT ELSEWHERE CLASSIFIED    Diabetes mellitus without complication (HCC)    Phreesia 10/13/2020   Dyspnea    Foot deformity, acquired    Grief reaction    History of kidney stones    Hyperlipidemia    Phreesia 10/13/2020   Hypertension    Phreesia 10/13/2020   Myalgia and myositis, unspecified    Onychomycosis    Oral thrush    Pneumonia    4 months ago   Pre-diabetes    Rotator cuff syndrome of left shoulder    Rotator cuff tear, right    Shortness of breath    SHOULDER PAIN, RIGHT    Sleep apnea    Phreesia 10/13/2020   Sleep disorder    Urinary hesitancy    Past Surgical History:  Procedure Laterality Date   cryotherphy  1986   TRANSURETHRAL RESECTION OF BLADDER TUMOR N/A 12/21/2020   Procedure: TRANSURETHRAL RESECTION OF BLADDER TUMOR (TURBT)WITH CYSTOSTOPY/ BILATERAL RETROGRADE PYELOGRAM WITH LEFT STENT PLACEMENT;  Surgeon: Heloise Purpura, MD;  Location: [facility];  Service: Urology;  Laterality: N/A;  GENERAL ANESTHESIA WITHH PARALYSIS   TUBAL LIGATION     Allergies  Allergen Reactions   Codeine Nausea And Vomiting   Prior to Admission  medications   Medication Sig Start Date End Date Taking? Authorizing Provider  ASPIRIN 81 PO Take 81 mg by mouth daily.   Yes [provider]  atorvastatin (LIPITOR) 10 MG tablet Take 1 tablet (10 mg total) by mouth daily. 03/14/23  Yes Shade Flood, MD  budesonide-formoterol [facility]) 160-4.5 MCG/ACT inhaler Inhale 2 puffs into the lungs 2 (two) times daily.   Yes [provider]  Calcium Carb-Cholecalciferol (CALCIUM 600/VITAMIN  D3 PO) Take 1 tablet by mouth daily.   Yes [provider]  diclofenac Sodium (VOLTAREN) 1 % GEL Apply 4gms topically four times a day as needed 10/25/22  Yes Shade Flood, MD  escitalopram (LEXAPRO) 20 MG tablet Take 1 tablet (20 mg total) by mouth daily. 03/23/23  Yes Shade Flood, MD  fluticasone [facility]) 50 MCG/ACT nasal spray Use 2 sprays into each nostril every day 01/30/23  Yes Shade Flood, MD  hydrochlorothiazide (MICROZIDE) 12.5 MG capsule Take 1 capsule (12.5 mg total) by mouth daily. 02/15/23  Yes Shade Flood, MD  hydrOXYzine (ATARAX) 10 MG tablet Take one-half to one tablet by mouth at bedtime only as needed 07/05/22  Yes Shade Flood, MD  lisinopril (ZESTRIL) 10 MG tablet Take 1 tablet (10 mg total) by mouth daily. 02/15/23  Yes Shade Flood, MD  metFORMIN (GLUCOPHAGE) 500 MG tablet Take 1 tablet (500 mg total) by mouth daily. 02/15/23  Yes Shade Flood, MD  Omega-3 Fatty Acids (FISH OIL) 1000 MG CAPS Take by mouth.   Yes [provider]  vitamin E 180 MG (400 UNITS) capsule Take 400 Units by mouth daily.   Yes [provider]   Social History   Socioeconomic History   Marital status: Widowed    Spouse name: Not on file   Number of children: Not on file   Years of education: Not on file   Highest education level: Not on file  Occupational History   Not on file  Tobacco Use   Smoking status: Every Day    Current packs/day: 0.50    Types: Cigarettes   Smokeless  tobacco: Never   Tobacco comments:    increased smoking again due to stress  Vaping Use   Vaping status: Never Used  Substance and Sexual Activity   Alcohol use: No   Drug use: No   Sexual activity: Never    Birth control/protection: Surgical, Post-menopausal  Other Topics Concern   Not on file  Social History Narrative   Not on file   Social Determinants of Health   Financial Resource Strain: Low Risk  (03/01/2023)   Overall Financial Resource Strain (CARDIA)    Difficulty of Paying Living Expenses: Not hard at all  Food Insecurity: No Food Insecurity (03/01/2023)   Hunger Vital Sign    Worried About Running Out of Food in the Last Year: Never true    Ran Out of Food in the Last Year: Never true  Transportation Needs: No Transportation Needs (03/01/2023)   PRAPARE - Administrator, Civil Service (Medical): No    Lack of Transportation (Non-Medical): No  Physical Activity: Insufficiently Active (03/01/2023)   Exercise Vital Sign    Days of Exercise per Week: 2 days    Minutes of Exercise per Session: 40 min  Stress: Stress Concern Present (03/01/2023)   [employer] of Occupational Health - Occupational Stress Questionnaire    Feeling of Stress : Rather much  Social Connections: Moderately Integrated (03/01/2023)   Social Connection and Isolation Panel [NHANES]    Frequency of Communication with Friends and Family: Twice a week    Frequency of Social Gatherings with Friends and Family: Once a week    Attends Religious Services: More than 4 times per year    Active Member of [employer] or Organizations: Yes    Attends Banker Meetings: More than 4 times per year    Marital Status: Widowed  Intimate  Partner Violence: Not At Risk (03/01/2023)   Humiliation, Afraid, Rape, and Kick questionnaire    Fear of Current or Ex-Partner: No    Emotionally Abused: No    Physically Abused: No    Sexually Abused: No    Review of Systems  Constitutional:  Negative  for fatigue and unexpected weight change.  Respiratory:  Negative for chest tightness and shortness of breath.   Cardiovascular:  Negative for chest pain, palpitations and leg swelling.  Gastrointestinal:  Negative for abdominal pain and blood in stool.  Neurological:  Negative for dizziness, syncope, light-headedness and headaches.     Objective:   Vitals:   06/28/23 1333  BP: 122/70  Pulse: 65  Temp: 98.8 F (37.1 C)  TempSrc: Temporal  SpO2: 96%  Weight: 154 lb 12.8 oz (70.2 kg)  Height: 5\' 4"  (1.626 m)     Physical Exam Vitals reviewed.  Constitutional:      Appearance: Normal appearance. She is well-developed.  HENT:     Head: Normocephalic and atraumatic.  Eyes:     Conjunctiva/sclera: Conjunctivae normal.     Pupils: Pupils are equal, round, and reactive to light.  Neck:     Vascular: No carotid bruit.  Cardiovascular:     Rate and Rhythm: Normal rate and regular rhythm.     Heart sounds: Normal heart sounds.  Pulmonary:     Effort: Pulmonary effort is normal.     Breath sounds: Normal breath sounds.  Abdominal:     Palpations: Abdomen is soft. There is no pulsatile mass.     Tenderness: There is no abdominal tenderness.  Musculoskeletal:     Right lower leg: No edema.     Left lower leg: No edema.     Comments: Hallux valgus deformity of left foot with some overlying erythema on the medial aspect but no wounds.  Skin intact.  Tender to palpation at the MTP.  multiple thickened discolored nails.  Skin:    General: Skin is warm and dry.  Neurological:     Mental Status: She is alert and oriented to person, place, and time.  Psychiatric:        Mood and Affect: Mood normal.        Behavior: Behavior normal.       Assessment & Plan:  Vicki [name] is a 62 y.o. female . 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  Refer to podiatry.  Also noted to have multiple areas of onychomycosis.  CMP was ordered in case they want to discuss use of Lamisil, but we discussed treatment of onychomycosis including potentially long-term treatment or recurrence.  OSA (obstructive sleep apnea)  -Untreated.  Recommended follow-up with sleep specialist.  May be a contributing to her daytime somnolence.  Potential risks of untreated sleep apnea were discussed.  Essential hypertension - Plan: Comprehensive metabolic panel, hydrochlorothiazide (MICROZIDE) 12.5 MG capsule, lisinopril (ZESTRIL) 10 MG tablet  -Stable on current regimen, continue same, check labs and adjust plan accordingly.  Hyperlipidemia, unspecified hyperlipidemia type - Plan: Comprehensive metabolic panel, Lipid panel, atorvastatin (LIPITOR) 10 MG tablet  -Tolerating current dose Lipitor, check labs, no changes for now.  Prediabetes - Plan: Hemoglobin A1c, metFORMIN (GLUCOPHAGE) 500 MG tablet  -Continue metformin, check A1c and adjust plan accordingly  Depression with anxiety - Plan: escitalopram (LEXAPRO) 20 MG tablet, hydrOXYzine (ATARAX) 10 MG tablet Situational stress -  Plan: hydrOXYzine (ATARAX) 10 MG tablet  -Stable with current doses of Lexapro, hydroxyzine as needed.  Meds ordered this encounter  Medications   hydrochlorothiazide (MICROZIDE) 12.5 MG capsule    Sig: Take 1 capsule (12.5 mg total) by mouth daily.    Dispense:  90 capsule    Refill:  1    [facility] - PLEASE SEND ALL FUTURE MEDS HERE.   lisinopril (ZESTRIL) 10 MG tablet    Sig: Take 1 tablet (10 mg total) by mouth daily.    Dispense:  90 tablet    Refill:  1   metFORMIN (GLUCOPHAGE) 500 MG tablet    Sig: Take 1 tablet (500 mg total) by mouth daily.    Dispense:  90 tablet    Refill:  1    [facility] - PLEASE SEND ALL FUTURE MEDS HERE.   escitalopram (LEXAPRO) 20 MG tablet    Sig: Take 1 tablet (20 mg total) by mouth  daily.    Dispense:  90 tablet    Refill:  1    Not included in last dose pack please ensure packed with next dispense of mediations pt filling short dose at local pharmacy for meantime   atorvastatin (LIPITOR) 10 MG tablet    Sig: Take 1 tablet (10 mg total) by mouth daily.    Dispense:  90 tablet    Refill:  1    [facility] - PLEASE SEND ALL FUTURE MEDS HERE.   hydrOXYzine (ATARAX) 10 MG tablet    Sig: Take 1 tablet (10 mg total) by mouth at bedtime as needed.    Dispense:  30 tablet    Refill:  11   Patient Instructions  Thank you for coming in today.  I will refer you to podiatry to discuss treatment for the bunion as well as the fungal toenail infection.  I am checking some baseline labs today in case they decide to put you on an oral medication.  Topical pain relievers on that foot are fine for now.  Let me know if something different needed, but hopefully you will be seen by podiatry soon.  No other change in your chronic medications at this time.  I will check labs and let you know if any concerns.  As we discussed some of the daytime sleepiness could be from your untreated sleep apnea.  There are some other potential risks with untreated sleep apnea as we discussed.  I would strongly recommend you follow-up with sleep specialist as we have discussed to review treatment options again.  Let me know if I can help.  Also let me know if any assistance needed to help quit smoking. Take care.   Bunion A bunion (hallux valgus) is a bump that forms slowly on the inner side of the big toe joint. It occurs when the big toe turns toward the second toe. Bunions may be small at first, but they often get larger over time. They can make walking painful. What are the causes? This condition may be caused by: Wearing narrow or pointed shoes that force the big toe to press against the other toes. Abnormal foot development that causes the foot to roll inward. Changes in the foot  that are caused by certain diseases, such as rheumatoid arthritis or polio. A foot injury. What increases the risk? The following factors may make you more likely to develop this condition: Wearing shoes that squeeze the toes together. Having  certain diseases, such as: Rheumatoid arthritis. Polio. Cerebral palsy. Having family members who have bunions. Being born with abnormally shaped feet (a foot deformity), such as flat feet or low arches. Doing activities that put a lot of pressure on the feet, such as ballet dancing. What are the signs or symptoms?  The main symptom of this condition is a bump on your big toe that you can notice. Other symptoms may include: Pain. Redness and inflammation around your big toe. Thick or hardened skin on your big toe or between your toes. Stiffness or loss of motion in your big toe. Trouble with walking. How is this diagnosed? This condition may be diagnosed based on your symptoms, medical history, and activities. You may also have tests and imaging, such as: X-rays. These allow your health care provider to check the position of the bones in your foot and look for damage to your joint. They also help your health care provider determine the severity of your bunion and the best way to treat it. Joint aspiration. In this test, a sample of fluid is removed from the toe joint. This test may be done if you are in a lot of pain. It helps rule out diseases that cause painful swelling of the joints, such as arthritis or gout. How is this treated? Treatment depends on the severity of your symptoms. The goal of treatment is to relieve symptoms and prevent your bunion from getting worse. Your health care provider may recommend: Wearing shoes that have a wide toe box, or using bunion pads to cushion the affected area. Taping your toes together to keep them in a normal position. Placing a device inside your shoe (orthotic device) to help reduce pressure on your toe  joint. Taking medicine to ease pain and inflammation. Putting ice or heat on the affected area. Doing stretching exercises. Surgery, for severe cases. Follow these instructions at home: Managing pain, stiffness, and swelling     If directed, put ice on the painful area. To do this: Put ice in a plastic bag. Place a towel between your skin and the bag. Leave the ice on for 20 minutes, 2-3 times a day. Remove the ice if your skin turns bright red. This is very important. If you cannot feel pain, heat, or cold, you have a greater risk of damage to the area. If directed, apply heat to the affected area before you exercise. Use the heat source that your health care provider recommends, such as a moist heat pack or a heating pad. Place a towel between your skin and the heat source. Leave the heat on for 20-30 minutes. Remove the heat if your skin turns bright red. This is especially important if you are unable to feel pain, heat, or cold. You have a greater risk of getting burned. General instructions Do exercises as told by your health care provider. Support your toe joint with proper footwear, shoe padding, or taping as told by your health care provider. Take over-the-counter and prescription medicines only as told by your health care provider. Do not use any products that contain nicotine or tobacco, such as cigarettes, e-cigarettes, and chewing tobacco. If you need help quitting, ask your health care provider. Keep all follow-up visits. This is important. Contact a health care provider if: Your symptoms get worse. Your symptoms do not improve in 2 weeks. Get help right away if: You have severe pain and trouble with walking. Summary A bunion is a bump on the inner side of  the big toe joint that forms when the big toe turns toward the second toe. Bunions can make walking painful. Treatment depends on the severity of your symptoms. Support your toe joint with proper footwear, shoe  padding, or taping as told by your health care provider. This information is not intended to replace advice given to you by your health care provider. Make sure you discuss any questions you have with your health care provider. Document Revised: 11/28/2019 Document Reviewed: 11/29/2019 Elsevier Patient Education  2024 Elsevier Inc.   Steps to Quit Smoking Smoking tobacco is the leading cause of preventable death. It can affect almost every organ in the body. Smoking puts you and those around you at risk for developing many serious chronic diseases. Quitting smoking can be very challenging. Do not get discouraged if you are not successful the first time. Some people need to make many attempts to quit before they achieve long-term success. Do your best to stick to your quit plan, and talk with your health care provider if you have any questions or concerns. How do I get ready to quit? When you decide to quit smoking, create a plan to help you succeed. Before you quit: Pick a date to quit. Set a date within the next 2 weeks to give you time to prepare. Write down the reasons why you are quitting. Keep this list in places where you will see it often. Tell your family, friends, and co-workers that you are quitting. Support from people you are close to can make quitting easier. Talk with your health care provider about your options for quitting smoking. Find out what treatment options are covered by your health insurance. Identify people, places, things, and activities that make you want to smoke (triggers). Avoid them. What first steps can I take to quit smoking? Throw away all cigarettes at home, at work, and in your car. Throw away smoking accessories, such as Set designer. Clean your car. Make sure to empty the ashtray. Clean your home, including curtains and carpets. What strategies can I use to quit smoking? Talk with your health care provider about combining strategies, such as  taking medicines while you are also receiving in-person counseling. Using these two strategies together makes you more likely to succeed in quitting than if you used either strategy on its own. If you are pregnant or breastfeeding, talk with your health care provider about finding counseling or other support strategies to quit smoking. Do not take medicine to help you quit smoking unless your health care provider tells you to. Quit right away Quit smoking completely, instead of gradually reducing how much you smoke over a period of time. Stopping smoking right away may be more successful than gradually quitting. Attend in-person counseling to help you build problem-solving skills. You are more likely to succeed in quitting if you attend counseling sessions regularly. Even short sessions of 10 minutes can be effective. Take medicine You may take medicines to help you quit smoking. Some medicines require a prescription. You can also purchase over-the-counter medicines. Medicines may have nicotine in them to replace the nicotine in cigarettes. Medicines may: Help to stop cravings. Help to relieve withdrawal symptoms. Your health care provider may recommend: Nicotine patches, gum, or lozenges. Nicotine inhalers or sprays. Non-nicotine medicine that you take by mouth. Find resources Find resources and support systems that can help you quit smoking and remain smoke-free after you quit. These resources are most helpful when you use them often. They include: Online  chats with a Veterinary surgeon. Telephone quitlines. Printed Materials engineer. Support groups or group counseling. Text messaging programs. Mobile phone apps or applications. Use apps that can help you stick to your quit plan by providing reminders, tips, and encouragement. Examples of free services include Quit Guide from the CDC and smokefree.gov  What can I do to make it easier to quit?  Reach out to your family and friends for support and  encouragement. Call telephone quitlines, such as 1-800-QUIT-NOW, reach out to support groups, or work with a counselor for support. Ask people who smoke to avoid smoking around you. Avoid places that trigger you to smoke, such as bars, parties, or smoke-break areas at work. Spend time with people who do not smoke. Lessen the stress in your life. Stress can be a smoking trigger for some people. To lessen stress, try: Exercising regularly. Doing deep-breathing exercises. Doing yoga. Meditating. What benefits will I see if I quit smoking? Over time, you should start to see positive results, such as: Improved sense of smell and taste. Decreased coughing and sore throat. Slower heart rate. Lower blood pressure. Clearer and healthier skin. The ability to breathe more easily. Fewer sick days. Summary Quitting smoking can be very challenging. Do not get discouraged if you are not successful the first time. Some people need to make many attempts to quit before they achieve long-term success. When you decide to quit smoking, create a plan to help you succeed. Quit smoking right away, not slowly over a period of time. Find resources and support systems that can help you quit smoking and remain smoke-free after you quit. This information is not intended to replace advice given to you by your health care provider. Make sure you discuss any questions you have with your health care provider. Document Revised: 07/16/2021 Document Reviewed: 07/16/2021 Elsevier Patient Education  2024 Elsevier Inc.     Signed,   Meredith Staggers, MD [facility], [facility] 06/28/23 2:39 PM

## 2023-06-28 NOTE — Patient Instructions (Addendum)
Thank you for coming in today.  I will refer you to podiatry to discuss treatment for the bunion as well as the fungal toenail infection.  I am checking some baseline labs today in case they decide to put you on an oral medication.  Topical pain relievers on that foot are fine for now.  Let me know if something different needed, but hopefully you will be seen by podiatry soon.  No other change in your chronic medications at this time.  I will check labs and let you know if any concerns.  As we discussed some of the daytime sleepiness could be from your untreated sleep apnea.  There are some other potential risks with untreated sleep apnea as we discussed.  I would strongly recommend you follow-up with sleep specialist as we have discussed to review treatment options again.  Let me know if I can help.  Also let me know if any assistance needed to help quit smoking. Take care.   Bunion A bunion (hallux valgus) is a bump that forms slowly on the inner side of the big toe joint. It occurs when the big toe turns toward the second toe. Bunions may be small at first, but they often get larger over time. They can make walking painful. What are the causes? This condition may be caused by: Wearing narrow or pointed shoes that force the big toe to press against the other toes. Abnormal foot development that causes the foot to roll inward. Changes in the foot that are caused by certain diseases, such as rheumatoid arthritis or polio. A foot injury. What increases the risk? The following factors may make you more likely to develop this condition: Wearing shoes that squeeze the toes together. Having certain diseases, such as: Rheumatoid arthritis. Polio. Cerebral palsy. Having family members who have bunions. Being born with abnormally shaped feet (a foot deformity), such as flat feet or low arches. Doing activities that put a lot of pressure on the feet, such as ballet dancing. What are the signs or  symptoms?  The main symptom of this condition is a bump on your big toe that you can notice. Other symptoms may include: Pain. Redness and inflammation around your big toe. Thick or hardened skin on your big toe or between your toes. Stiffness or loss of motion in your big toe. Trouble with walking. How is this diagnosed? This condition may be diagnosed based on your symptoms, medical history, and activities. You may also have tests and imaging, such as: X-rays. These allow your health care provider to check the position of the bones in your foot and look for damage to your joint. They also help your health care provider determine the severity of your bunion and the best way to treat it. Joint aspiration. In this test, a sample of fluid is removed from the toe joint. This test may be done if you are in a lot of pain. It helps rule out diseases that cause painful swelling of the joints, such as arthritis or gout. How is this treated? Treatment depends on the severity of your symptoms. The goal of treatment is to relieve symptoms and prevent your bunion from getting worse. Your health care provider may recommend: Wearing shoes that have a wide toe box, or using bunion pads to cushion the affected area. Taping your toes together to keep them in a normal position. Placing a device inside your shoe (orthotic device) to help reduce pressure on your toe joint. Taking medicine to  ease pain and inflammation. Putting ice or heat on the affected area. Doing stretching exercises. Surgery, for severe cases. Follow these instructions at home: Managing pain, stiffness, and swelling     If directed, put ice on the painful area. To do this: Put ice in a plastic bag. Place a towel between your skin and the bag. Leave the ice on for 20 minutes, 2-3 times a day. Remove the ice if your skin turns bright red. This is very important. If you cannot feel pain, heat, or cold, you have a greater risk of damage  to the area. If directed, apply heat to the affected area before you exercise. Use the heat source that your health care provider recommends, such as a moist heat pack or a heating pad. Place a towel between your skin and the heat source. Leave the heat on for 20-30 minutes. Remove the heat if your skin turns bright red. This is especially important if you are unable to feel pain, heat, or cold. You have a greater risk of getting burned. General instructions Do exercises as told by your health care provider. Support your toe joint with proper footwear, shoe padding, or taping as told by your health care provider. Take over-the-counter and prescription medicines only as told by your health care provider. Do not use any products that contain nicotine or tobacco, such as cigarettes, e-cigarettes, and chewing tobacco. If you need help quitting, ask your health care provider. Keep all follow-up visits. This is important. Contact a health care provider if: Your symptoms get worse. Your symptoms do not improve in 2 weeks. Get help right away if: You have severe pain and trouble with walking. Summary A bunion is a bump on the inner side of the big toe joint that forms when the big toe turns toward the second toe. Bunions can make walking painful. Treatment depends on the severity of your symptoms. Support your toe joint with proper footwear, shoe padding, or taping as told by your health care provider. This information is not intended to replace advice given to you by your health care provider. Make sure you discuss any questions you have with your health care provider. Document Revised: 11/28/2019 Document Reviewed: 11/29/2019 Elsevier Patient Education  2024 Elsevier Inc.   Steps to Quit Smoking Smoking tobacco is the leading cause of preventable death. It can affect almost every organ in the body. Smoking puts you and those around you at risk for developing many serious chronic  diseases. Quitting smoking can be very challenging. Do not get discouraged if you are not successful the first time. Some people need to make many attempts to quit before they achieve long-term success. Do your best to stick to your quit plan, and talk with your health care provider if you have any questions or concerns. How do I get ready to quit? When you decide to quit smoking, create a plan to help you succeed. Before you quit: Pick a date to quit. Set a date within the next 2 weeks to give you time to prepare. Write down the reasons why you are quitting. Keep this list in places where you will see it often. Tell your family, friends, and co-workers that you are quitting. Support from people you are close to can make quitting easier. Talk with your health care provider about your options for quitting smoking. Find out what treatment options are covered by your health insurance. Identify people, places, things, and activities that make you want to smoke (triggers).  Avoid them. What first steps can I take to quit smoking? Throw away all cigarettes at home, at work, and in your car. Throw away smoking accessories, such as Set designer. Clean your car. Make sure to empty the ashtray. Clean your home, including curtains and carpets. What strategies can I use to quit smoking? Talk with your health care provider about combining strategies, such as taking medicines while you are also receiving in-person counseling. Using these two strategies together makes you more likely to succeed in quitting than if you used either strategy on its own. If you are pregnant or breastfeeding, talk with your health care provider about finding counseling or other support strategies to quit smoking. Do not take medicine to help you quit smoking unless your health care provider tells you to. Quit right away Quit smoking completely, instead of gradually reducing how much you smoke over a period of time. Stopping  smoking right away may be more successful than gradually quitting. Attend in-person counseling to help you build problem-solving skills. You are more likely to succeed in quitting if you attend counseling sessions regularly. Even short sessions of 10 minutes can be effective. Take medicine You may take medicines to help you quit smoking. Some medicines require a prescription. You can also purchase over-the-counter medicines. Medicines may have nicotine in them to replace the nicotine in cigarettes. Medicines may: Help to stop cravings. Help to relieve withdrawal symptoms. Your health care provider may recommend: Nicotine patches, gum, or lozenges. Nicotine inhalers or sprays. Non-nicotine medicine that you take by mouth. Find resources Find resources and support systems that can help you quit smoking and remain smoke-free after you quit. These resources are most helpful when you use them often. They include: Online chats with a Veterinary surgeon. Telephone quitlines. Printed Materials engineer. Support groups or group counseling. Text messaging programs. Mobile phone apps or applications. Use apps that can help you stick to your quit plan by providing reminders, tips, and encouragement. Examples of free services include Quit Guide from the CDC and smokefree.gov  What can I do to make it easier to quit?  Reach out to your family and friends for support and encouragement. Call telephone quitlines, such as 1-800-QUIT-NOW, reach out to support groups, or work with a counselor for support. Ask people who smoke to avoid smoking around you. Avoid places that trigger you to smoke, such as bars, parties, or smoke-break areas at work. Spend time with people who do not smoke. Lessen the stress in your life. Stress can be a smoking trigger for some people. To lessen stress, try: Exercising regularly. Doing deep-breathing exercises. Doing yoga. Meditating. What benefits will I see if I quit smoking? Over  time, you should start to see positive results, such as: Improved sense of smell and taste. Decreased coughing and sore throat. Slower heart rate. Lower blood pressure. Clearer and healthier skin. The ability to breathe more easily. Fewer sick days. Summary Quitting smoking can be very challenging. Do not get discouraged if you are not successful the first time. Some people need to make many attempts to quit before they achieve long-term success. When you decide to quit smoking, create a plan to help you succeed. Quit smoking right away, not slowly over a period of time. Find resources and support systems that can help you quit smoking and remain smoke-free after you quit. This information is not intended to replace advice given to you by your health care provider. Make sure you discuss any questions you have  with your health care provider. Document Revised: 07/16/2021 Document Reviewed: 07/16/2021 Elsevier Patient Education  2024 [employer].

## 2023-06-29 LAB — COMPREHENSIVE METABOLIC PANEL
ALT: 11 U/L (ref 0–35)
AST: 15 U/L (ref 0–37)
Albumin: 4.1 g/dL (ref 3.5–5.2)
Alkaline Phosphatase: 73 U/L (ref 39–117)
BUN: 11 mg/dL (ref 6–23)
CO2: 32 meq/L (ref 19–32)
Calcium: 9.3 mg/dL (ref 8.4–10.5)
Chloride: 97 meq/L (ref 96–112)
Creatinine, Ser: 0.73 mg/dL (ref 0.40–1.20)
GFR: 87.42 mL/min (ref >60.00)
Glucose, Bld: 103 mg/dL — ABNORMAL HIGH (ref 70–99)
Potassium: 4.1 meq/L (ref 3.5–5.1)
Sodium: 135 meq/L (ref 135–145)
Total Bilirubin: 0.4 mg/dL (ref 0.2–1.2)
Total Protein: 6.8 g/dL (ref 6.0–8.3)

## 2023-06-29 LAB — LIPID PANEL
Cholesterol: 205 mg/dL — ABNORMAL HIGH (ref 0–200)
HDL: 45 mg/dL (ref >39.00)
LDL Cholesterol: 134 mg/dL — ABNORMAL HIGH (ref 0–99)
NonHDL: 159.58
Total CHOL/HDL Ratio: 5
Triglycerides: 126 mg/dL (ref 0.0–149.0)
VLDL: 25.2 mg/dL (ref 0.0–40.0)

## 2023-06-29 LAB — HEMOGLOBIN A1C: Hgb A1c MFr Bld: 6.5 % (ref 4.6–6.5)

## 2023-07-10 ENCOUNTER — Telehealth: Payer: Self-pay

## 2023-07-10 NOTE — Telephone Encounter (Signed)
-----   Message from Shade Flood sent at 07/07/2023  5:16 PM EST ----- Results sent by MyChart, but appears patient has not yet reviewed those results.  Please call and make sure they have either seen note or discuss result note.  Thanks.

## 2023-07-10 NOTE — Telephone Encounter (Signed)
Cholesterol is elevated but slightly improved from 8 months ago.  Blood sugar few points elevated but overall electrolytes looked okay.  1-month blood sugar test was stable. Since you are tolerating the Lipitor, I would recommend trying 2 pills/day to see if you tolerate that dose.  If so, let me know and I will send in the 20 mg dose.  Let me know if there are questions.   Dr. Neva Seat

## 2023-07-10 NOTE — Telephone Encounter (Signed)
Pt returned the call.

## 2023-07-10 NOTE — Telephone Encounter (Signed)
Pt has been informed and she will call back let us know how the increased dose goes.

## 2023-07-13 ENCOUNTER — Ambulatory Visit (INDEPENDENT_AMBULATORY_CARE_PROVIDER_SITE_OTHER): Payer: Medicare HMO | Admitting: Podiatry

## 2023-07-13 DIAGNOSIS — Z91199 Patient's noncompliance with other medical treatment and regimen due to unspecified reason: Secondary | ICD-10-CM

## 2023-07-15 NOTE — Progress Notes (Signed)
Patient was no-show for appointment today 

## 2023-07-21 ENCOUNTER — Encounter: Payer: Self-pay | Admitting: Podiatry

## 2023-07-21 ENCOUNTER — Ambulatory Visit (INDEPENDENT_AMBULATORY_CARE_PROVIDER_SITE_OTHER): Payer: Medicare HMO | Admitting: Podiatry

## 2023-07-21 ENCOUNTER — Ambulatory Visit (INDEPENDENT_AMBULATORY_CARE_PROVIDER_SITE_OTHER): Payer: Medicare HMO

## 2023-07-21 DIAGNOSIS — M79674 Pain in right toe(s): Secondary | ICD-10-CM | POA: Diagnosis not present

## 2023-07-21 DIAGNOSIS — M21619 Bunion of unspecified foot: Secondary | ICD-10-CM

## 2023-07-21 DIAGNOSIS — M778 Other enthesopathies, not elsewhere classified: Secondary | ICD-10-CM

## 2023-07-21 DIAGNOSIS — M21612 Bunion of left foot: Secondary | ICD-10-CM | POA: Diagnosis not present

## 2023-07-21 DIAGNOSIS — M79675 Pain in left toe(s): Secondary | ICD-10-CM

## 2023-07-21 DIAGNOSIS — B351 Tinea unguium: Secondary | ICD-10-CM

## 2023-07-21 DIAGNOSIS — M722 Plantar fascial fibromatosis: Secondary | ICD-10-CM | POA: Diagnosis not present

## 2023-07-21 MED ORDER — TRIAMCINOLONE ACETONIDE 10 MG/ML IJ SUSP
10.0000 mg | Freq: Once | INTRAMUSCULAR | Status: AC
Start: 2023-07-21 — End: 2023-07-21
  Administered 2023-07-21: 10 mg via INTRA_ARTICULAR

## 2023-07-24 NOTE — Progress Notes (Signed)
Subjective:   Patient ID: Vicki [name], female   DOB: 62 y.o.   MRN: [id number]   HPI Patient presents with intense discomfort plantar aspect left heel at the insertional point tendon into the calcaneus fluid buildup and also has severe nail disease 1-5 both feet that are thick and painful   ROS      Objective:  Physical Exam  Neurovascular status intact exquisite discomfort left plantar fascia at the insertion of the tendon calcaneus with patient found to have thickened yellowed brittle nailbeds 1-5 both feet painful     Assessment:  Acute plantar fasciitis left inability to bear weight on the foot along with mycotic nail infection 1-5 both feet painful      Plan:  H&P reviewed went ahead today and for the heel that so intense I did do sterile prep and injected the plantar fascia at insertion 3 mg Kenalog 5 mg Xylocaine and then dispensed air fracture walker to completely immobilize the foot and take all pressure off the bottom of the heel.  Instructed on usage properly fitted and I then went ahead debrided nailbeds 1-5 both feet no iatrogenic bleeding  X-rays indicate there is some spur formation of the left heel no other pathology was noted

## 2023-07-25 ENCOUNTER — Telehealth: Payer: Self-pay | Admitting: Podiatry

## 2023-07-25 NOTE — Telephone Encounter (Signed)
DOS-08/15/23  [place] BUNIONECTOMY LT- 81191  [employer] EFFECTIVE DATE- 02/06/23  DEDUCTIBLE- $240.00 WITH REMAINING $0.00 OOP-$8850.00 WITH REMAINING $8,525.13  COINSURANCE- 20 %   PER THE [place] WEBSITE PORTAL, PRIOR AUTH HAS BEEN APPROVED FOR CPT CODE 47829. GOOD FROM 08/15/23 - 10/13/2023  Authorization #[id number]  Tracking #[id number]

## 2023-07-26 ENCOUNTER — Emergency Department (HOSPITAL_COMMUNITY)
Admission: EM | Admit: 2023-07-26 | Discharge: 2023-07-26 | Disposition: A | Discharge status: Home / Self Care | Payer: Medicare HMO | Attending: Emergency Medicine | Admitting: Emergency Medicine

## 2023-07-26 ENCOUNTER — Emergency Department (HOSPITAL_COMMUNITY): Payer: Medicare HMO

## 2023-07-26 ENCOUNTER — Encounter (HOSPITAL_COMMUNITY): Payer: Self-pay | Admitting: Emergency Medicine

## 2023-07-26 DIAGNOSIS — R112 Nausea with vomiting, unspecified: Secondary | ICD-10-CM | POA: Insufficient documentation

## 2023-07-26 DIAGNOSIS — Z8551 Personal history of malignant neoplasm of bladder: Secondary | ICD-10-CM | POA: Diagnosis not present

## 2023-07-26 DIAGNOSIS — R110 Nausea: Secondary | ICD-10-CM | POA: Diagnosis not present

## 2023-07-26 DIAGNOSIS — Z7982 Long term (current) use of aspirin: Secondary | ICD-10-CM | POA: Diagnosis not present

## 2023-07-26 DIAGNOSIS — I700 Atherosclerosis of aorta: Secondary | ICD-10-CM | POA: Diagnosis not present

## 2023-07-26 DIAGNOSIS — R42 Dizziness and giddiness: Secondary | ICD-10-CM | POA: Diagnosis not present

## 2023-07-26 DIAGNOSIS — Z79899 Other long term (current) drug therapy: Secondary | ICD-10-CM | POA: Diagnosis not present

## 2023-07-26 DIAGNOSIS — R0902 Hypoxemia: Secondary | ICD-10-CM | POA: Diagnosis not present

## 2023-07-26 DIAGNOSIS — J449 Chronic obstructive pulmonary disease, unspecified: Secondary | ICD-10-CM | POA: Insufficient documentation

## 2023-07-26 DIAGNOSIS — E041 Nontoxic single thyroid nodule: Secondary | ICD-10-CM | POA: Diagnosis not present

## 2023-07-26 DIAGNOSIS — Z7984 Long term (current) use of oral hypoglycemic drugs: Secondary | ICD-10-CM | POA: Insufficient documentation

## 2023-07-26 DIAGNOSIS — I10 Essential (primary) hypertension: Secondary | ICD-10-CM | POA: Diagnosis not present

## 2023-07-26 LAB — COMPREHENSIVE METABOLIC PANEL
ALT: 15 U/L (ref 0–44)
AST: 15 U/L (ref 15–41)
Albumin: 3.5 g/dL (ref 3.5–5.0)
Alkaline Phosphatase: 59 U/L (ref 38–126)
Anion gap: 7 (ref 5–15)
BUN: 12 mg/dL (ref 8–23)
CO2: 29 mmol/L (ref 22–32)
Calcium: 9 mg/dL (ref 8.9–10.3)
Chloride: 101 mmol/L (ref 98–111)
Creatinine, Ser: 0.67 mg/dL (ref 0.44–1.00)
GFR, Estimated: >60 mL/min (ref >60)
Glucose, Bld: 110 mg/dL — ABNORMAL HIGH (ref 70–99)
Potassium: 4.3 mmol/L (ref 3.5–5.1)
Sodium: 137 mmol/L (ref 135–145)
Total Bilirubin: 0.6 mg/dL (ref <1.2)
Total Protein: 6.4 g/dL — ABNORMAL LOW (ref 6.5–8.1)

## 2023-07-26 LAB — CBC
HCT: 44.5 % (ref 36.0–46.0)
Hemoglobin: 14.5 g/dL (ref 12.0–15.0)
MCH: 29.2 pg (ref 26.0–34.0)
MCHC: 32.6 g/dL (ref 30.0–36.0)
MCV: 89.7 fL (ref 80.0–100.0)
Platelets: 368 10*3/uL (ref 150–400)
RBC: 4.96 MIL/uL (ref 3.87–5.11)
RDW: 13 % (ref 11.5–15.5)
WBC: 8.8 10*3/uL (ref 4.0–10.5)
nRBC: 0 % (ref 0.0–0.2)

## 2023-07-26 LAB — LIPASE, BLOOD: Lipase: 28 U/L (ref 11–51)

## 2023-07-26 MED ORDER — ONDANSETRON HCL 4 MG/2ML IJ SOLN
4.0000 mg | Freq: Once | INTRAMUSCULAR | Status: AC
  Administered 2023-07-26: 4 mg via INTRAVENOUS
  Filled 2023-07-26: qty 2

## 2023-07-26 MED ORDER — MECLIZINE HCL 25 MG PO TABS
25.0000 mg | ORAL_TABLET | Freq: Once | ORAL | Status: AC
  Administered 2023-07-26: 25 mg via ORAL
  Filled 2023-07-26: qty 1

## 2023-07-26 MED ORDER — SODIUM CHLORIDE 0.9 % IV BOLUS
1000.0000 mL | Freq: Once | INTRAVENOUS | Status: AC
  Administered 2023-07-26: 1000 mL via INTRAVENOUS

## 2023-07-26 MED ORDER — IOHEXOL 350 MG/ML SOLN
75.0000 mL | Freq: Once | INTRAVENOUS | Status: AC | PRN
  Administered 2023-07-26: 75 mL via INTRAVENOUS

## 2023-07-26 MED ORDER — MECLIZINE HCL 12.5 MG PO TABS: 12.5000 mg | ORAL_TABLET | Freq: Three times a day (TID) | ORAL | 0 refills | Status: AC | PRN

## 2023-07-26 NOTE — Discharge Instructions (Addendum)
Evaluation today was overall reassuring.  Sent meclizine to your pharmacy.  Recommend you follow-up with ENT if your symptoms persist otherwise would recommend follow-up PCP.  If your dizziness worsens, you develop chest pain, headache or visual disturbance or issues with your balance or any other concerning symptom please return emergency department further evaluation.

## 2023-07-26 NOTE — ED Notes (Signed)
AVS with prescriptions provided to and discussed with patient and family member at bedside. Pt verbalizes understanding of discharge instructions and denies any questions or concerns at this time. Pt has ride home. Pt ambulated out of department independently with steady gait.  

## 2023-07-26 NOTE — ED Triage Notes (Signed)
Pt here from home with c/o n/v and dizziness , cbg 124 , that's strated this morning , 4mg  zofran given by ems

## 2023-07-26 NOTE — ED Provider Notes (Signed)
[place] EMERGENCY DEPARTMENT AT [facility] Provider Note   CSN: [id number] Arrival date & time: 07/26/23  1033     History  No chief complaint on file.  HPI Vicki [name] is a 62 y.o. female with history of COPD, bladder cancer presenting for nausea vomiting with dizziness.  Started this morning around 4 AM.  She woke up and was nauseous and vomiting and felt like the room was spinning.  Also states she felt a little off balance with walking but was able to ambulate around the room.  Symptoms seem to be worse going from a sitting to standing position.  Denies any associated shortness of breath or chest pain or palpitations.  Denies abdominal pain and fever.  Denies headache and visual disturbance.  HPI     Home Medications Prior to Admission medications   Medication Sig Start Date End Date Taking? Authorizing Provider  meclizine (ANTIVERT) 12.5 MG tablet Take 1 tablet (12.5 mg total) by mouth 3 (three) times daily as needed for dizziness. 07/26/23  Yes Gareth Eagle, PA-C  ASPIRIN 81 PO Take 81 mg by mouth daily.    [provider]  atorvastatin (LIPITOR) 10 MG tablet Take 1 tablet (10 mg total) by mouth daily. 06/28/23   Shade Flood, MD  budesonide-formoterol [facility]) 160-4.5 MCG/ACT inhaler Inhale 2 puffs into the lungs 2 (two) times daily.    [provider]  Calcium Carb-Cholecalciferol (CALCIUM 600/VITAMIN D3 PO) Take 1 tablet by mouth daily.    [provider]  diclofenac Sodium (VOLTAREN) 1 % GEL Apply 4gms topically four times a day as needed 10/25/22   Shade Flood, MD  escitalopram (LEXAPRO) 20 MG tablet Take 1 tablet (20 mg total) by mouth daily. 06/28/23   Shade Flood, MD  fluticasone Aleda Grana) 50 MCG/ACT nasal spray Use 2 sprays into each nostril every day 01/30/23   Shade Flood, MD  hydrochlorothiazide (MICROZIDE) 12.5 MG capsule Take 1 capsule (12.5 mg total) by mouth daily. 06/28/23   Shade Flood, MD  hydrOXYzine (ATARAX) 10 MG tablet Take 1 tablet (10 mg total) by mouth at bedtime as needed. 06/28/23   Shade Flood, MD  lisinopril (ZESTRIL) 10 MG tablet Take 1 tablet (10 mg total) by mouth daily. 06/28/23   Shade Flood, MD  metFORMIN (GLUCOPHAGE) 500 MG tablet Take 1 tablet (500 mg total) by mouth daily. 06/28/23   Shade Flood, MD  Omega-3 Fatty Acids (FISH OIL) 1000 MG CAPS Take by mouth.    [provider]  vitamin E 180 MG (400 UNITS) capsule Take 400 Units by mouth daily.    [provider]      Allergies    Codeine    Review of Systems   See HPI  Physical Exam Updated Vital Signs BP 138/72   Pulse 72   Temp 98.2 F (36.8 C) (Oral)   Resp 14   SpO2 99%  Physical Exam Vitals and nursing note reviewed.  HENT:     Head: Normocephalic and atraumatic.     Mouth/Throat:     Mouth: Mucous membranes are moist.  Eyes:     General:        Right eye: No discharge.        Left eye: No discharge.     Conjunctiva/sclera: Conjunctivae normal.  Cardiovascular:     Rate and Rhythm: Normal rate and regular rhythm.     Pulses: Normal pulses.  Heart sounds: Normal heart sounds.  Pulmonary:     Effort: Pulmonary effort is normal.     Breath sounds: Normal breath sounds.  Abdominal:     General: Abdomen is flat. There is no distension.     Palpations: Abdomen is soft.     Tenderness: There is no abdominal tenderness.  Skin:    General: Skin is warm and dry.  Neurological:     General: No focal deficit present.     Comments: GCS 15. Speech is goal oriented. No deficits appreciated to CN III-XII; symmetric eyebrow raise, no facial drooping, tongue midline. Patient has equal grip strength bilaterally with 5/5 strength against resistance in all major muscle groups bilaterally. Sensation to light touch intact. Patient moves extremities without ataxia. Normal finger-nose-finger. Patient ambulatory with steady gait.  Psychiatric:         Mood and Affect: Mood normal.     ED Results / Procedures / Treatments   Labs (all labs ordered are listed, but only abnormal results are displayed) Labs Reviewed  COMPREHENSIVE METABOLIC PANEL - Abnormal; Notable for the following components:      Result Value   Glucose, Bld 110 (*)    Total Protein 6.4 (*)    All other components within normal limits  CBC  LIPASE, BLOOD    EKG None  Radiology CT Angio Head Neck W WO CM Result Date: 07/26/2023 CLINICAL DATA:  Stroke suspected EXAM: CT ANGIOGRAPHY HEAD AND NECK WITH AND WITHOUT CONTRAST TECHNIQUE: Multidetector CT imaging of the head and neck was performed using the standard protocol during bolus administration of intravenous contrast. Multiplanar CT image reconstructions and MIPs were obtained to evaluate the vascular anatomy. Carotid stenosis measurements (when applicable) are obtained utilizing NASCET criteria, using the distal internal carotid diameter as the denominator. RADIATION DOSE REDUCTION: This exam was performed according to the departmental dose-optimization program which includes automated exposure control, adjustment of the mA and/or kV according to patient size and/or use of iterative reconstruction technique. CONTRAST:  75mL OMNIPAQUE IOHEXOL 350 MG/ML SOLN COMPARISON:  No prior CTA available, correlation is made with CT head 01/21/2021 FINDINGS: CT HEAD FINDINGS Brain: No evidence of acute infarct, hemorrhage, mass, mass effect, or midline shift. No hydrocephalus or extra-axial fluid collection. Vascular: No hyperdense vessel. Skull: Negative for fracture or focal lesion. Sinuses/Orbits: Mucosal thickening in the ethmoid air cells, frontal sinuses, and right sphenoid sinus. No acute finding in the orbits. Other: The mastoid air cells are well aerated. CTA NECK FINDINGS Aortic arch: Two-vessel arch with a common origin of the brachiocephalic and left common carotid arteries. Imaged portion shows no evidence of aneurysm or  dissection. No significant stenosis of the major arch vessel origins. Aortic atherosclerosis. Right carotid system: No evidence of dissection, occlusion, or hemodynamically significant stenosis (greater than 50%). Left carotid system: No evidence of dissection, occlusion, or hemodynamically significant stenosis (greater than 50%). Vertebral arteries: No evidence of dissection, occlusion, or hemodynamically significant stenosis (greater than 50%). Skeleton: No acute osseous abnormality. Degenerative changes in the cervical spine. Other neck: Left thyroid lobe nodule measures up to 1.2 cm, for which no follow-up is currently indicated. (Reference: J Am Coll Radiol. 2015 Feb;12(2): 143-50) Upper chest: No focal pulmonary opacity or pleural effusion. Emphysema. Review of the MIP images confirms the above findings CTA HEAD FINDINGS Anterior circulation: Both internal carotid arteries are patent to the termini, without significant stenosis. A1 segments patent. Normal anterior communicating artery. Anterior cerebral arteries are patent to their distal aspects without  significant stenosis. No M1 stenosis or occlusion. MCA branches perfused to their distal aspects without significant stenosis. Posterior circulation: Vertebral arteries patent to the vertebrobasilar junction without significant stenosis. Posterior inferior cerebellar arteries patent proximally. Basilar patent to its distal aspect without significant stenosis. Small fenestration in the basilar artery (series 8, image 117). Superior cerebellar arteries patent proximally. Patent P1 segments. PCAs perfused to their distal aspects without significant stenosis. Venous sinuses: As permitted by contrast timing, patent. Anatomic variants: None significant. No evidence of aneurysm or vascular malformation. Review of the MIP images confirms the above findings IMPRESSION: 1. No acute intracranial process. 2. No intracranial large vessel occlusion or significant stenosis.  3. No hemodynamically significant stenosis in the neck. 4. Aortic Atherosclerosis (ICD10-I70.0) and Emphysema (ICD10-J43.9). Electronically Signed   By: Wiliam Ke M.D.   On: 07/26/2023 14:48    Procedures Procedures    Medications Ordered in ED Medications  meclizine (ANTIVERT) tablet 25 mg (25 mg Oral Given 07/26/23 1203)  sodium chloride 0.9 % bolus 1,000 mL (0 mLs Intravenous Stopped 07/26/23 1408)  ondansetron (ZOFRAN) injection 4 mg (4 mg Intravenous Given 07/26/23 1203)  iohexol (OMNIPAQUE) 350 MG/ML injection 75 mL (75 mLs Intravenous Contrast Given 07/26/23 1433)    ED Course/ Medical Decision Making/ A&P                                 Medical Decision Making Amount and/or Complexity of Data Reviewed Labs: ordered. Radiology: ordered.  Risk Prescription drug management.   Initial Impression and Ddx 62 year old well-appearing female presenting for dizziness, nausea and vomiting.  Exam is unremarkable.  DDx includes stroke, electrolyte derangement, intra-abdominal infection, other. Patient PMH that increases complexity of ED encounter: history of COPD, bladder cancer  Interpretation of Diagnostics - I independent reviewed and interpreted the labs as followed: No acute derangements  - I independently visualized the following imaging with scope of interpretation limited to determining acute life threatening conditions related to emergency care: CTA head neck, which revealed no acute processes  - I personally reviewed and interpreted EKG which revealed NSR and non ischemic  Patient Reassessment and Ultimate Disposition/Management On reassessment, patient stated that she was no longer dizzy and nauseous.  Workup was reassuring.  Does not indicate intra-abdominal infection or central vertigo.  Suspect peripheral cause.  Sent meclizine to her pharmacy and advised to follow-up with ENT if her symptoms persist.  Discussed.  Return precautions.  Vital stable.  Discharged  good condition.  Patient management required discussion with the following services or consulting groups:  None  Complexity of Problems Addressed Acute complicated illness or Injury  Additional Data Reviewed and Analyzed Further history obtained from: Further history from spouse/family member and Past medical history and medications listed in the EMR  Patient Encounter Risk Assessment Prescriptions         Final Clinical Impression(s) / ED Diagnoses Final diagnoses:  Dizziness  Nausea and vomiting, unspecified vomiting type    Rx / DC Orders ED Discharge Orders          Ordered    meclizine (ANTIVERT) 12.5 MG tablet  3 times daily PRN        07/26/23 1523              Gareth Eagle, PA-C 07/26/23 1524    Virgina Norfolk, DO 07/26/23 2248

## 2023-07-26 NOTE — ED Notes (Signed)
Pt transported to CT via stretcher.  

## 2023-07-26 NOTE — ED Notes (Signed)
Pt ambulatory to BR with steady gait. Reports improvement in dizziness.

## 2023-08-11 ENCOUNTER — Telehealth: Payer: Self-pay | Admitting: Podiatry

## 2023-08-11 NOTE — Telephone Encounter (Signed)
 DOS - 08/15/23  [place] BUNIONECTOMY [id number]  [employer] EFFECTIVE DATE- 08/09/23  DEDUCTIBLE- $257.00 WITH REMAINING $257.00 OOP-$9350.00 WITH REMAINING $9350.00 COINSURANCE- 20%  PER THE [employer] WEBSITE PORTAL, PRIOR AUTH IS NOT REQUIRED FOR CPT CODE 71703.  AUTH Decision ID #: [id number]

## 2023-08-14 MED ORDER — HYDROCODONE-ACETAMINOPHEN 10-325 MG PO TABS: 1.0000 | ORAL_TABLET | Freq: Three times a day (TID) | ORAL | 0 refills | Status: AC | PRN

## 2023-08-15 DIAGNOSIS — M2011 Hallux valgus (acquired), right foot: Secondary | ICD-10-CM | POA: Diagnosis not present

## 2023-08-21 ENCOUNTER — Encounter: Payer: Self-pay | Admitting: Podiatry

## 2023-08-21 ENCOUNTER — Ambulatory Visit (INDEPENDENT_AMBULATORY_CARE_PROVIDER_SITE_OTHER): Payer: 59

## 2023-08-21 ENCOUNTER — Ambulatory Visit (INDEPENDENT_AMBULATORY_CARE_PROVIDER_SITE_OTHER): Payer: 59 | Admitting: Podiatry

## 2023-08-21 DIAGNOSIS — Z9889 Other specified postprocedural states: Secondary | ICD-10-CM

## 2023-08-22 ENCOUNTER — Other Ambulatory Visit: Payer: Self-pay | Admitting: Family Medicine

## 2023-08-22 DIAGNOSIS — Z1231 Encounter for screening mammogram for malignant neoplasm of breast: Secondary | ICD-10-CM

## 2023-08-23 NOTE — Progress Notes (Signed)
 Subjective:   Patient ID: Vicki [name], female   DOB: 63 y.o.   MRN: [id number]   HPI Patient states she is doing well with her surgery she does admit that she did walk on this at nighttime without any form of immobilization   ROS      Objective:  Physical Exam  Neurovascular status intact negative Toula' sign noted wound edges are coapted very well but there is some moderate increase in swelling most likely due to trauma and the second toe pin is in good alignment     Assessment:  Overall appears to be doing well but did unfortunately have some trauma to the area and it may cause some bone structure issue and we will keep her immobilized     Plan:  H&P x-rays reviewed discussed that there is a crack in the distal osteotomy of the first metatarsal right fixations and placed in a boot think it will heal uneventfully but organ to keep her immobilized and it is possible long-term she may need a fusion of the joint if it were to become arthritic.  At this point sterile dressing reapplied and continue boot therapy and reappoint in 2 weeks earlier if needed  X-rays indicate that there is a crack in the plantar surface with the pin most likely holding it and the dorsal is holding well and will be watched with the second toe in good alignment

## 2023-09-04 ENCOUNTER — Ambulatory Visit: Payer: 59 | Admitting: Podiatry

## 2023-09-04 ENCOUNTER — Encounter: Payer: Self-pay | Admitting: Podiatry

## 2023-09-04 ENCOUNTER — Ambulatory Visit (INDEPENDENT_AMBULATORY_CARE_PROVIDER_SITE_OTHER): Payer: 59

## 2023-09-04 DIAGNOSIS — Z9889 Other specified postprocedural states: Secondary | ICD-10-CM | POA: Diagnosis not present

## 2023-09-04 DIAGNOSIS — D169 Benign neoplasm of bone and articular cartilage, unspecified: Secondary | ICD-10-CM

## 2023-09-04 NOTE — Progress Notes (Signed)
Subjective:   Patient ID: Vicki [name], female   DOB: 63 y.o.   MRN: [id number]   HPI Patient states been doing fine she is doing a better job and not walking on it without immobilization   ROS      Objective:  Physical Exam  Neuro vascular status intact negative Denna Haggard' sign was noted wound edges well coapted good range of motion incision site healing well     Assessment:  Overall doing well with patient having had trauma secondary to walking on this without boot     Plan:  H&P x-ray reviewed and may begin wearing surgical shoe but want her to stay completely immobilized at this time and reappoint in the next several weeks  X-rays indicate osteotomies healing well with no further movement from the trauma that occurred from the initial walking on this without any immobilization

## 2023-09-06 ENCOUNTER — Ambulatory Visit
Admission: RE | Admit: 2023-09-06 | Discharge: 2023-09-06 | Disposition: A | Discharge status: Home / Self Care | Payer: 59 | Source: Ambulatory Visit | Attending: Family Medicine | Admitting: Family Medicine

## 2023-09-06 DIAGNOSIS — Z1231 Encounter for screening mammogram for malignant neoplasm of breast: Secondary | ICD-10-CM

## 2023-09-20 ENCOUNTER — Encounter: Payer: Self-pay | Admitting: Family Medicine

## 2023-09-20 ENCOUNTER — Ambulatory Visit: Payer: Self-pay | Admitting: Family Medicine

## 2023-09-20 ENCOUNTER — Telehealth: Payer: 59 | Admitting: Family Medicine

## 2023-09-20 DIAGNOSIS — J209 Acute bronchitis, unspecified: Secondary | ICD-10-CM | POA: Diagnosis not present

## 2023-09-20 DIAGNOSIS — R059 Cough, unspecified: Secondary | ICD-10-CM | POA: Diagnosis not present

## 2023-09-20 DIAGNOSIS — J440 Chronic obstructive pulmonary disease with acute lower respiratory infection: Secondary | ICD-10-CM | POA: Diagnosis not present

## 2023-09-20 MED ORDER — DOXYCYCLINE HYCLATE 100 MG PO TABS: 100.0000 mg | ORAL_TABLET | Freq: Two times a day (BID) | ORAL | 0 refills | Status: AC

## 2023-09-20 NOTE — Telephone Encounter (Signed)
Copied from CRM [id number]. Topic: Clinical - Pink Word Triage >> Sep 20, 2023 10:53 AM Adaysia C wrote: Reason for Triage: Patient feels like she has bronchitis because she has been coughing up green mucus and feels fatigued. Patient warm transferred to nurse triage.  Chief Complaint: Cough Symptoms: Coughing spells, sore chest due to cough Frequency: 3 days Pertinent Negatives: Patient denies difficulty breathing Disposition: [] ED /[] Urgent Care (no appt availability in office) / [x] Appointment(In office/virtual)/ []  [facility]/ [] Home Care/ [] Refused Recommended Disposition /[] [facility]/ []  Follow-up with PCP Additional Notes: Patient called in to report a productive cough that has been occurring for 3 days. Patient stated the cough produces green mucous. Patient stated she is having frequent coughing spells and was up all night coughing. Patient denied SOB and fever. Patient able to speak in clear and complete sentences while on the phone with this RN. This RN advised patient to see a provider within 24 hours. Patient specifically requested a virtual appointment. Patient scheduled for this afternoon with PCP. This RN advised patient to call back if symptoms worsen. Patient complied.   Reason for Disposition  SEVERE coughing spells (e.g., whooping sound after coughing, vomiting after coughing)  Answer Assessment - Initial Assessment Questions 1. ONSET: "When did the cough begin?"      3 days 2. SEVERITY: "How bad is the cough today?"      Frequent coughing spells, states she was coughing all night long 3. SPUTUM: "Describe the color of your sputum" (none, dry cough; clear, white, yellow, green)     Green 4. HEMOPTYSIS: "Are you coughing up any blood?" If so ask: "How much?" (flecks, streaks, tablespoons, etc.)     Denies 5. DIFFICULTY BREATHING: "Are you having difficulty breathing?" If Yes, ask: "How bad is it?" (e.g., mild, moderate, severe)    - MILD: No SOB at  rest, mild SOB with walking, speaks normally in sentences, can lie down, no retractions, pulse < 100.    - MODERATE: SOB at rest, SOB with minimal exertion and prefers to sit, cannot lie down flat, speaks in phrases, mild retractions, audible wheezing, pulse 100-120.    - SEVERE: Very SOB at rest, speaks in single words, struggling to breathe, sitting hunched forward, retractions, pulse > 120      Denies 6. FEVER: "Do you have a fever?" If Yes, ask: "What is your temperature, how was it measured, and when did it start?"     Low grade fever last Friday 7. CARDIAC HISTORY: "Do you have any history of heart disease?" (e.g., heart attack, congestive heart failure)      Denies 8. LUNG HISTORY: "Do you have any history of lung disease?"  (e.g., pulmonary embolus, asthma, emphysema)     Smoker 10. OTHER SYMPTOMS: "Do you have any other symptoms?" (e.g., runny nose, wheezing, chest pain)     States her chest is sore from coughing  Protocols used: Cough - Acute Productive-A-AH

## 2023-09-20 NOTE — Patient Instructions (Signed)
Sorry to hear that you are sick.  Although you certainly could have a viral illness, with a productive cough and discolored mucus, COPD flare is possible.  Start antibiotic doxycycline, continue Symbicort, be seen if you are not improving this week, sooner if any new or worsening symptoms as we discussed.  Let me know if there are questions.  Cough, Adult Coughing is a reflex that clears your throat and airways (respiratory system). It helps heal and protect your lungs. It is normal to cough from time to time. A cough that happens with other symptoms or that lasts a long time may be a sign of a condition that needs treatment. A short-term (acute) cough may only last 2-3 weeks. A long-term (chronic) cough may last 8 or more weeks. Coughing is often caused by: Diseases, such as: An infection of the respiratory system. Asthma or other heart or lung diseases. Gastroesophageal reflux. This is when acid comes back up from the stomach. Breathing in things that irritate your lungs. Allergies. Postnasal drip. This is when mucus runs down the back of your throat. Smoking. Some medicines. Follow these instructions at home: Medicines Take over-the-counter and prescription medicines only as told by your health care provider. Talk with your provider before you take cough medicine (cough suppressants). Eating and drinking Do not drink alcohol. Avoid caffeine. Drink enough fluid to keep your pee (urine) pale yellow. Lifestyle Avoid cigarette smoke. Do not use any products that contain nicotine or tobacco. These products include cigarettes, chewing tobacco, and vaping devices, such as e-cigarettes. If you need help quitting, ask your provider. Avoid things that make you cough. These may include perfumes, candles, cleaning products, or campfire smoke. General instructions  Watch for any changes to your cough. Tell your provider about them. Always cover your mouth when you cough. If the air is dry in your  bedroom or home, use a cool mist vaporizer or humidifier. If your cough is worse at night, try to sleep in a semi-upright position. Rest as needed. Contact a health care provider if: You have new symptoms, or your symptoms get worse. You cough up pus. You have a fever that does not go away or a cough that does not get better after 2-3 weeks. You cannot control your cough with medicine, and you are losing sleep. You have pain that gets worse or is not helped with medicine. You lose weight for no clear reason. You have night sweats. Get help right away if: You cough up blood. You have trouble breathing. Your heart is beating very fast. These symptoms may be an emergency. Get help right away. Call 911. Do not wait to see if the symptoms will go away. Do not drive yourself to the hospital. This information is not intended to replace advice given to you by your health care provider. Make sure you discuss any questions you have with your health care provider. Document Revised: 03/25/2022 Document Reviewed: 03/25/2022 Elsevier Patient Education  2024 [employer].

## 2023-09-20 NOTE — Progress Notes (Signed)
Virtual Visit via Video Note Not online for visit at 439 pm. Repeat link sent. Not online at 19 - called patient - she was having difficulty in connecting -  repeat link sent.  I connected with [name] on 09/20/23 at 4:50 PM by a video enabled telemedicine application and verified that I am speaking with the correct person using two identifiers.  Patient location: at home - with sister, consent given to discuss PHI.  My location: office - [place].    I discussed the limitations, risks, security and privacy concerns of performing an evaluation and management service by telephone and the availability of in person appointments. I also discussed with the patient that there may be a patient responsible charge related to this service. The patient expressed understanding and agreed to proceed, consent obtained  Follow-up portion of visit over phone as some issues with audio toward end of visit.  Majority of visit over video. Chief complaint:  Chief Complaint  Patient presents with   Cough    Pt is at home, notes started with cough, and mucous about 3 days ago, weakness, denied fever, difficulty sleeping, concerned for bronchitis     History of Present Illness: Vicki [name] is a 63 y.o. female  Cough: Started 4 days ago. Runny nose, productive cough with green phlegm.  Slight shortness of breath. Using symbicort BID. Has albuterol but it is old inhaler. Used a few times with current cough. Helped breathing  - increased cough. No wheeze.  No fever past few days. Some myalgias, joint aches. Some HA with cough.  No sick contacts at home.  No flu vaccine this season.  Slight improvement today - less shortness of breath - still discolored phlegm Drinking fluids ok.  History of COPD with chronic bronchitis.   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   Influenza A 09/26/2018   COPD with acute  exacerbation (HCC) 09/26/2018   Neck pain 02/05/2016   Leg swelling 01/19/2016   Dry cough 02/11/2015   Fatigue 02/11/2015   Prediabetes 10/29/2014   Polyuria 10/28/2014   Hyperglycemia 10/28/2014   Left breast mass 04/11/2014   Right hip pain 03/21/2014   Edema, lower extremity 03/21/2014   Low back pain 12/17/2013   Right knee pain 12/17/2013   Chest pain, central 11/05/2013   Lateral epicondylitis of left elbow 11/05/2013   Obstructive chronic bronchitis without exacerbation (HCC) 08/23/2013   Wrist fracture, right 07/08/2013   Sciatica 05/28/2013   Grief reaction 04/02/2013   Myalgia and myositis, unspecified 03/07/2012   Rotator cuff syndrome of left shoulder 03/07/2012   Depression with anxiety 03/07/2012   Foot deformity, acquired 06/21/2011   Callous ulcer (HCC) 06/21/2011   Acquired hallux valgus of left foot 05/10/2011   Sleep disorder 02/04/2011   Rotator cuff tear, right 09/03/2010   SHOULDER PAIN, RIGHT 08/20/2010   TOBACCO ABUSE 06/11/2010   DEPRESSIVE DISORDER NOT ELSEWHERE CLASSIFIED 06/11/2010   Past Medical History:  Diagnosis Date   Acquired hallux valgus of left foot    Bladder tumor    Callous ulcer (HCC)    Depression with anxiety    DEPRESSIVE DISORDER NOT ELSEWHERE CLASSIFIED    Diabetes mellitus without complication (HCC)    Phreesia 10/13/2020   Dyspnea    Foot deformity, acquired    Grief reaction    History of kidney stones    Hyperlipidemia    Phreesia 10/13/2020  Hypertension    Phreesia 10/13/2020   Myalgia and myositis, unspecified    Onychomycosis    Oral thrush    Pneumonia    4 months ago   Pre-diabetes    Rotator cuff syndrome of left shoulder    Rotator cuff tear, right    Shortness of breath    SHOULDER PAIN, RIGHT    Sleep apnea    Phreesia 10/13/2020   Sleep disorder    Urinary hesitancy    Past Surgical History:  Procedure Laterality Date   cryotherphy  1986   TRANSURETHRAL RESECTION OF BLADDER TUMOR N/A  12/21/2020   Procedure: TRANSURETHRAL RESECTION OF BLADDER TUMOR (TURBT)WITH CYSTOSTOPY/ BILATERAL RETROGRADE PYELOGRAM WITH LEFT STENT PLACEMENT;  Surgeon: Heloise Purpura, MD;  Location: [facility];  Service: Urology;  Laterality: N/A;  GENERAL ANESTHESIA WITHH PARALYSIS   TUBAL LIGATION     Allergies  Allergen Reactions   Codeine Nausea And Vomiting   Prior to Admission medications   Medication Sig Start Date End Date Taking? Authorizing Provider  ASPIRIN 81 PO Take 81 mg by mouth daily.   Yes [provider]  atorvastatin (LIPITOR) 10 MG tablet Take 1 tablet (10 mg total) by mouth daily. 06/28/23  Yes Shade Flood, MD  budesonide-formoterol [facility]) 160-4.5 MCG/ACT inhaler Inhale 2 puffs into the lungs 2 (two) times daily.   Yes [provider]  Calcium Carb-Cholecalciferol (CALCIUM 600/VITAMIN D3 PO) Take 1 tablet by mouth daily.   Yes [provider]  diclofenac Sodium (VOLTAREN) 1 % GEL Apply 4gms topically four times a day as needed 10/25/22  Yes Shade Flood, MD  escitalopram (LEXAPRO) 20 MG tablet Take 1 tablet (20 mg total) by mouth daily. 06/28/23  Yes Shade Flood, MD  fluticasone Aleda Grana) 50 MCG/ACT nasal spray Use 2 sprays into each nostril every day 01/30/23  Yes Shade Flood, MD  hydrochlorothiazide (MICROZIDE) 12.5 MG capsule Take 1 capsule (12.5 mg total) by mouth daily. 06/28/23  Yes Shade Flood, MD  hydrOXYzine (ATARAX) 10 MG tablet Take 1 tablet (10 mg total) by mouth at bedtime as needed. 06/28/23  Yes Shade Flood, MD  lisinopril (ZESTRIL) 10 MG tablet Take 1 tablet (10 mg total) by mouth daily. 06/28/23  Yes Shade Flood, MD  meclizine (ANTIVERT) 12.5 MG tablet Take 1 tablet (12.5 mg total) by mouth 3 (three) times daily as needed for dizziness. 07/26/23  Yes Gareth Eagle, PA-C  metFORMIN (GLUCOPHAGE) 500 MG tablet Take 1 tablet (500 mg total) by mouth daily. 06/28/23  Yes Shade Flood, MD  Omega-3  Fatty Acids (FISH OIL) 1000 MG CAPS Take by mouth.   Yes [provider]  vitamin E 180 MG (400 UNITS) capsule Take 400 Units by mouth daily.   Yes [provider]   Social History   Socioeconomic History   Marital status: Widowed    Spouse name: Not on file   Number of children: Not on file   Years of education: Not on file   Highest education level: Not on file  Occupational History   Not on file  Tobacco Use   Smoking status: Every Day    Current packs/day: 0.50    Types: Cigarettes   Smokeless tobacco: Never   Tobacco comments:    increased smoking again due to stress  Vaping Use   Vaping status: Never Used  Substance and Sexual Activity   Alcohol use: No   Drug use: No  Sexual activity: Never    Birth control/protection: Surgical, Post-menopausal  Other Topics Concern   Not on file  Social History Narrative   Not on file   Social Drivers of Health   Financial Resource Strain: Low Risk  (03/01/2023)   Overall Financial Resource Strain (CARDIA)    Difficulty of Paying Living Expenses: Not hard at all  Food Insecurity: No Food Insecurity (03/01/2023)   Hunger Vital Sign    Worried About Running Out of Food in the Last Year: Never true    Ran Out of Food in the Last Year: Never true  Transportation Needs: No Transportation Needs (03/01/2023)   PRAPARE - Administrator, Civil Service (Medical): No    Lack of Transportation (Non-Medical): No  Physical Activity: Insufficiently Active (03/01/2023)   Exercise Vital Sign    Days of Exercise per Week: 2 days    Minutes of Exercise per Session: 40 min  Stress: Stress Concern Present (03/01/2023)   [employer] of Occupational Health - Occupational Stress Questionnaire    Feeling of Stress : Rather much  Social Connections: Moderately Integrated (03/01/2023)   Social Connection and Isolation Panel [NHANES]    Frequency of Communication with Friends and Family: Twice a week    Frequency of  Social Gatherings with Friends and Family: Once a week    Attends Religious Services: More than 4 times per year    Active Member of [employer] or Organizations: Yes    Attends Banker Meetings: More than 4 times per year    Marital Status: Widowed  Intimate Partner Violence: Not At Risk (03/01/2023)   Humiliation, Afraid, Rape, and Kick questionnaire    Fear of Current or Ex-Partner: No    Emotionally Abused: No    Physically Abused: No    Sexually Abused: No    Observations/Objective: There were no vitals filed for this visit. Speaking in full sentences with no audible wheeze or stridor.  No respiratory distress.  Coherent responses.  Nontoxic appearance.  All questions answered with understanding of plan expressed.  Assessment and Plan: Cough, unspecified type - Plan: doxycycline (VIBRA-TABS) 100 MG tablet  Acute bronchitis with COPD (HCC) - Plan: doxycycline (VIBRA-TABS) 100 MG tablet Possible viral illness but with persistent discolored productive cough, history of COPD, possible COPD exacerbation.  Initial dyspnea has improved.  Compliant with Symbicort.  No change with albuterol previously.  -Start doxycycline, potential side effects and risk discussed.  Hold on prednisone for now as dyspnea improving.  RTC precautions discussed and recommended in person visit if not improving this week or any new/worsening symptoms.  Follow Up Instructions:  As needed. I discussed the assessment and treatment plan with the patient. The patient was provided an opportunity to ask questions and all were answered. The patient agreed with the plan and demonstrated an understanding of the instructions.   The patient was advised to call back or seek an in-person evaluation if the symptoms worsen or if the condition fails to improve as anticipated.   Shade Flood, MD

## 2023-09-27 ENCOUNTER — Ambulatory Visit (INDEPENDENT_AMBULATORY_CARE_PROVIDER_SITE_OTHER): Payer: 59

## 2023-09-27 ENCOUNTER — Ambulatory Visit (INDEPENDENT_AMBULATORY_CARE_PROVIDER_SITE_OTHER): Payer: 59 | Admitting: Podiatry

## 2023-09-27 ENCOUNTER — Encounter: Payer: Self-pay | Admitting: Podiatry

## 2023-09-27 DIAGNOSIS — M21621 Bunionette of right foot: Secondary | ICD-10-CM

## 2023-09-27 DIAGNOSIS — Z472 Encounter for removal of internal fixation device: Secondary | ICD-10-CM

## 2023-09-27 DIAGNOSIS — Z9889 Other specified postprocedural states: Secondary | ICD-10-CM

## 2023-09-28 NOTE — Progress Notes (Signed)
Subjective:   Patient ID: Vicki [name], female   DOB: 63 y.o.   MRN: [id number]   HPI Patient presents overall doing pretty well but do get some sharp pain in in the bottom at times.  Also complaining of pain on the outside of the right foot   ROS      Objective:  Physical Exam  Neurovascular status intact negative Denna Haggard' sign noted wound edges coapted well mild edema good range of motion with patient having history of trauma in the first week which did cause some stress on the osteotomy and the proximal pin to embed.  For the right foot I did note that there is inflammation around the fifth metatarsal head slight keratotic tissue plantarly     Assessment:  Overall doing pretty well with no pain upon motion of the joint or indications the pin is giving any pathology.  Tailor's bunion deformity right     Plan:  H&P reviewed and I did discuss gradual return to soft shoe gear and that probably the pin will need to be removed in the future.  Will see her back next 5 to 6 weeks make sure everything is healing well and then discussed pin removal which can be done in the office.  Discussed the right foot separately I debrided some tissue courtesy I discussed the possibility for fifth metatarsal head resection or osteotomy if symptoms persist  X-rays indicate there was some stress on the osteotomy for patient walking on the foot without a boot on but it appears to be stable and most likely long-term proximal he will need to be removed

## 2023-10-04 ENCOUNTER — Ambulatory Visit: Payer: Self-pay | Admitting: Family Medicine

## 2023-10-04 NOTE — Telephone Encounter (Signed)
 Patient has an appointment tomorrow with Dr Beverely Low

## 2023-10-04 NOTE — Telephone Encounter (Signed)
 Noted. Thanks.

## 2023-10-04 NOTE — Telephone Encounter (Signed)
  Chief Complaint: burning sensation- back of left upper arm Symptoms: burning sensation of skin, possible lump/lumps present under skin Frequency: 4 days Pertinent Negatives: Patient denies redness, itching, rash Disposition: [] ED /[] Urgent Care (no appt availability in office) / [x] Appointment(In office/virtual)/ []  [place]/ [] Home Care/ [] Refused Recommended Disposition /[] [facility]/ []  Follow-up with PCP Additional Notes: Very sensitive to touch area  of skin- patient states she thinks she can feel small lumps under the skin- no rash/redness- but burning is bothersome   Copied from CRM [id number]. Topic: Clinical - Red Word Triage >> Oct 04, 2023  2:33 PM Pascal Lux wrote: Red Word that prompted transfer to Nurse Triage: Patient stated she woke up 4 days ago with a burning sensation on her arm burning when touch, feels like a knot or lump Reason for Disposition  [1] Swelling is painful to touch AND [2] no fever  Answer Assessment - Initial Assessment Questions 1. APPEARANCE of SWELLING: "What does it look like?"     no 2. SIZE: "How large is the swelling?" (e.g., inches, cm; or compare to size of pinhead, tip of pen, eraser, coin, pea, grape, ping pong ball)      Knot- "lumpy" multiple lumps 3. LOCATION: "Where is the swelling located?"     Back of Left- above the elbow 4. ONSET: "When did the swelling start?"     4 days ago 5. COLOR: "What color is it?" "Is there more than one color?"     Normal color 6. PAIN: "Is there any pain?" If Yes, ask: "How bad is the pain?" (e.g., scale 1-10; or mild, moderate, severe)     - NONE (0): no pain   - MILD (1-3): doesn't interfere with normal activities    - MODERATE (4-7): interferes with normal activities or awakens from sleep    - SEVERE (8-10): excruciating pain, unable to do any normal activities     Burning sensation when touched- mild/moderate 7. ITCH: "Does it itch?" If Yes, ask: "How bad is the itch?"       no 8. CAUSE: "What do you think caused the swelling?"    unsure 9 OTHER SYMPTOMS: "Do you have any other symptoms?" (e.g., fever)     no  Protocols used: Skin Lump or Localized Swelling-A-AH

## 2023-10-05 ENCOUNTER — Encounter: Payer: Self-pay | Admitting: Family Medicine

## 2023-10-05 ENCOUNTER — Ambulatory Visit: Payer: 59 | Admitting: Family Medicine

## 2023-10-05 VITALS — BP 114/68 | HR 68 | Temp 98.7°F | Ht 64.0 in | Wt 149.2 lb

## 2023-10-05 DIAGNOSIS — Z124 Encounter for screening for malignant neoplasm of cervix: Secondary | ICD-10-CM | POA: Diagnosis not present

## 2023-10-05 DIAGNOSIS — R202 Paresthesia of skin: Secondary | ICD-10-CM | POA: Diagnosis not present

## 2023-10-05 MED ORDER — PREDNISONE 10 MG PO TABS: ORAL_TABLET | ORAL | 0 refills | Status: AC

## 2023-10-05 NOTE — Progress Notes (Signed)
   Subjective:    Patient ID: Vicki [name], female    DOB: 12/17/1960, 63 y.o.   MRN: [id number]  HPI L arm pain- pt reports Vicki [name] woke up 6 days w/ burning of L distal triceps.  No known injury.  States Vicki [name] can feel some lumps in the area.  Is sore to touch.  No obvious rash.  Area will burn when touched.  Doesn't feel 'normal' at rest but not burning like it does when pressed on.  No other similar areas.  Sleeps on R side not L.   Review of Systems For ROS see HPI     Objective:   Physical Exam Vitals reviewed.  Constitutional:      General: Vicki [name] is not in acute distress.    Appearance: Normal appearance. Vicki [name] is not ill-appearing.  HENT:     Head: Normocephalic and atraumatic.  Cardiovascular:     Rate and Rhythm: Normal rate.     Pulses: Normal pulses.  Pulmonary:     Effort: Pulmonary effort is normal.  Musculoskeletal:        General: Tenderness (TTP over L distal triceps w/o mass or deformity, no overlying skin changes/rash) present. No deformity.     Cervical back: Neck supple. No rigidity.  Lymphadenopathy:     Cervical: No cervical adenopathy.  Skin:    General: Skin is warm and dry.     Findings: No erythema or rash.  Neurological:     General: No focal deficit present.     Mental Status: Vicki [name] is alert and oriented to person, place, and time.     Motor: Weakness present.  Psychiatric:        Mood and Affect: Mood normal.        Behavior: Behavior normal.        Thought Content: Thought content normal.           Assessment & Plan:  Paresthesia L arm- new.  Pt w/o obvious abnormality on exam but has burning when distal triceps are touched.  Pain does not radiate from neck or shoulder per pt report.  No known injury.  Does not sleep on L side.  Will start Prednisone taper in case of nerve inflammation/irritation.  If no improvement, will need to possible refer for nerve conduction study.  Pt expressed understanding and is in agreement w/ plan.

## 2023-10-05 NOTE — Patient Instructions (Signed)
 Follow up as needed or as scheduled START the Prednisone as directed- 3 pills at the same time x3 days, then 2 pills at the same time x3 days, then 1 pill daily.  Take w/ food  If this is nerve irritation, the Prednisone should take care of your symptoms If the nerve is pinched or there is something else going on, we may need to do some more investigating Message me in 10-14 days and let me know how things are going Call with any questions or concerns Hang in there!

## 2023-10-11 ENCOUNTER — Encounter: Payer: Self-pay | Admitting: Family Medicine

## 2023-10-26 ENCOUNTER — Ambulatory Visit (INDEPENDENT_AMBULATORY_CARE_PROVIDER_SITE_OTHER)

## 2023-10-26 ENCOUNTER — Ambulatory Visit (INDEPENDENT_AMBULATORY_CARE_PROVIDER_SITE_OTHER): Payer: 59 | Admitting: Podiatry

## 2023-10-26 ENCOUNTER — Encounter: Payer: Self-pay | Admitting: Podiatry

## 2023-10-26 DIAGNOSIS — Z9889 Other specified postprocedural states: Secondary | ICD-10-CM

## 2023-10-26 DIAGNOSIS — Z472 Encounter for removal of internal fixation device: Secondary | ICD-10-CM

## 2023-10-26 DIAGNOSIS — M2011 Hallux valgus (acquired), right foot: Secondary | ICD-10-CM

## 2023-10-26 NOTE — Progress Notes (Signed)
 Subjective:   Patient ID: Vicki [name], female   DOB: 63 y.o.   MRN: [id number]   HPI Patient states she continues to improve but does have some sharp pain on the bottom and does admit that she had walked on this and that unfortunately there had been a fracture of the osteotomy   ROS      Objective:  Physical Exam  Neurovascular status intact negative Denna Haggard' sign noted swelling continues to reduce first MPJ left range of motion good with relative sharp pain plantar aspect left first metatarsal     Assessment:  Pin that moved distally secondary to the fracture in the left first MPJ that has provided the stability needed and there has been no further movement of the osteotomy with irritation most likely coming from the pin     Plan:  H&P reviewed discussed excellent motion no crepitus and at this point do think the proximal pin needs to be removed.  Patient wants this done and I allowed her to read consent form for removal explaining procedure patient scheduled for office surgery to remove the pin  X-rays indicate that the pin is plantar to the metatarsal probably creating irritation with previous fracture with trauma occurred during the initial.  After surgery

## 2023-10-27 DIAGNOSIS — J441 Chronic obstructive pulmonary disease with (acute) exacerbation: Secondary | ICD-10-CM | POA: Diagnosis not present

## 2023-10-31 ENCOUNTER — Other Ambulatory Visit: Payer: Self-pay | Admitting: Family Medicine

## 2023-10-31 DIAGNOSIS — F439 Reaction to severe stress, unspecified: Secondary | ICD-10-CM

## 2023-10-31 DIAGNOSIS — F418 Other specified anxiety disorders: Secondary | ICD-10-CM

## 2023-10-31 NOTE — Telephone Encounter (Signed)
 Copied from CRM [id number]. Topic: Clinical - Medication Refill >> Oct 31, 2023  4:09 PM Drema Balzarine wrote: Most Recent Primary Care Visit:  Provider: Sheliah Hatch  Department: [facility]  Visit Type: ACUTE  Date: 10/05/2023  Medication: hydrOXYzine   Has the patient contacted their pharmacy? Yes (Agent: If no, request that the patient contact the pharmacy for the refill. If patient does not wish to contact the pharmacy document the reason why and proceed with request.) (Agent: If yes, when and what did the pharmacy advise?)  Is this the correct pharmacy for this prescription? Yes If no, delete pharmacy and type the correct one.  This is the patient's preferred pharmacy:   [facility] - [place], [place] - [address] [place] [address] Phone: [phone] Fax: [phone]   Has the prescription been filled recently? Yes  Is the patient out of the medication? Yes  Has the patient been seen for an appointment in the last year OR does the patient have an upcoming appointment? Yes  Can we respond through MyChart? Yes  Agent: Please be advised that Rx refills may take up to 3 business days. We ask that you follow-up with your pharmacy.

## 2023-11-06 ENCOUNTER — Other Ambulatory Visit: Payer: Self-pay | Admitting: Family Medicine

## 2023-11-06 ENCOUNTER — Telehealth: Payer: Self-pay | Admitting: Urology

## 2023-11-06 DIAGNOSIS — F418 Other specified anxiety disorders: Secondary | ICD-10-CM

## 2023-11-06 DIAGNOSIS — F439 Reaction to severe stress, unspecified: Secondary | ICD-10-CM

## 2023-11-06 NOTE — Telephone Encounter (Signed)
 LM for pt to call back to schedule office sx with Dr. Charlsie Merles

## 2023-11-06 NOTE — Telephone Encounter (Signed)
 Copied from CRM [id number]. Topic: Clinical - Prescription Issue >> Nov 06, 2023  3:45 PM [place] E wrote: Reason for CRM: Patient called in regarding her hydrOXYzine (ATARAX) 10 MG tablet medication. Refill request was put in on 3/25, and patient stated the pharmacy still does not have this medication. Callback number for patient is [phone].

## 2023-11-07 MED ORDER — HYDROXYZINE HCL 10 MG PO TABS: 10.0000 mg | ORAL_TABLET | Freq: Every evening | ORAL | 11 refills | Status: AC | PRN

## 2023-11-13 NOTE — Progress Notes (Deleted)
 ANNUAL EXAM Patient name: Vicki [name] MRN [id number]  Date of birth: 01/10/1961 Chief Complaint:   No chief complaint on file.  History of Present Illness:   Vicki [name] is a 63 y.o. [id number] {race:25618} female being seen today for a routine annual exam.  Current complaints: ***  No LMP recorded. Patient is postmenopausal.   The pregnancy intention screening data noted above was reviewed. Potential methods of contraception were discussed. The patient elected to proceed with No data recorded.   Last pap ***. Results were: {Pap findings:25134}. H/O abnormal pap: {yes/yes***/no:23866} Last mammogram: 09/06/23. Results were: normal. Family h/o breast cancer: {yes***/no:23838} Last colonoscopy: 06/16/16. Results were: 5 polyps removed, recommendation to repeat in 2 years (2019). Family h/o colorectal cancer: {yes***/no:23838} STI screening:      09/20/2023    4:21 PM 06/28/2023    1:38 PM 03/01/2023   11:42 AM 11/07/2022    1:35 PM 10/27/2022    2:24 PM  Depression screen PHQ 2/9  Decreased Interest 0 1 3 3 2   Down, Depressed, Hopeless 0 1 3 0 2  PHQ - 2 Score 0 2 6 3 4   Altered sleeping 0 1 2 3 2   Tired, decreased energy 0 1 2 1 2   Change in appetite 0 1 0 0 0  Feeling bad or failure about yourself  0 1 0 0 0  Trouble concentrating 0 0 1 0 0  Moving slowly or fidgety/restless 0 0 0  2  Suicidal thoughts 0 0 0 0 0  PHQ-9 Score 0 6 11 7 10   Difficult doing work/chores  Not difficult at all Somewhat difficult Somewhat difficult         09/20/2023    4:21 PM 06/28/2023    1:39 PM 11/07/2022    1:37 PM 10/27/2022    2:24 PM  GAD 7 : Generalized Anxiety Score  Nervous, Anxious, on Edge 0 2 1 2   Control/stop worrying 0 0 0 1  Worry too much - different things 0 1 0 2  Trouble relaxing 0 1 0 1  Restless 0 0 0 0  Easily annoyed or irritable 0 1 1 1   Afraid - awful might happen 0 1 0 0  Total GAD 7 Score 0 6 2 7   Anxiety Difficulty  Not difficult at all Not difficult at  all      Review of Systems:   Pertinent items are noted in HPI Denies any headaches, blurred vision, fatigue, shortness of breath, chest pain, abdominal pain, abnormal vaginal discharge/itching/odor/irritation, problems with periods, bowel movements, urination, or intercourse unless otherwise stated above. Pertinent History Reviewed:  Reviewed past medical,surgical, social and family history.  Reviewed problem list, medications and allergies. Physical Assessment:  There were no vitals filed for this visit.There is no height or weight on file to calculate BMI.        Physical Examination:   General appearance - well appearing, and in no distress  Mental status - alert, oriented to person, place, and time  Psych:  She has a normal mood and affect  Skin - warm and dry, normal color, no suspicious lesions noted  Chest - effort normal, all lung fields clear to auscultation bilaterally  Heart - normal rate and regular rhythm  Neck:  midline trachea, no thyromegaly or nodules  Breasts - breasts appear normal, no suspicious masses, no skin or nipple changes or  axillary nodes  Abdomen - soft, nontender, nondistended, no masses or organomegaly  Pelvic -  VULVA: normal appearing vulva with no masses, tenderness or lesions  VAGINA: normal appearing vagina with normal color and discharge, no lesions  CERVIX: normal appearing cervix without discharge or lesions, no CMT  Thin prep pap is {Desc; done/not:10129} *** HR HPV cotesting  UTERUS: uterus is felt to be normal size, shape, consistency and nontender   ADNEXA: No adnexal masses or tenderness noted.  Rectal - normal rectal, good sphincter tone, no masses felt. Hemoccult: ***  Extremities:  No swelling or varicosities noted  Chaperone present for exam  No results found for this or any previous visit (from the past 24 hours).  Assessment & Plan:  - Cervical cancer screening: Discussed guidelines. Pap with HPV *** - Breast Health: Encouraged  self breast awareness/SBE. Discussed limits of clinical breast exam for detecting breast cancer. Discussed importance of annual MXR. MXR is up to date: repeat 08/2024 - Climacteric/Sexual health: Reviewed typical and atypical symptoms of menopause/peri-menopause. Discussed PMB and to call if any amount of spotting.  - Colonoscopy: {Blank single:19197::"Per PCP","up to date","declines"} - F/U 12 months and prn   No orders of the defined types were placed in this encounter.   Meds: No orders of the defined types were placed in this encounter.   Follow-up: No follow-ups on file.  Ralene Muskrat, [place] 11/13/2023 7:56 AM

## 2023-11-15 ENCOUNTER — Encounter: Payer: Self-pay | Admitting: Podiatry

## 2023-11-15 ENCOUNTER — Ambulatory Visit: Admitting: Physician Assistant

## 2023-11-15 ENCOUNTER — Ambulatory Visit (INDEPENDENT_AMBULATORY_CARE_PROVIDER_SITE_OTHER): Admitting: Podiatry

## 2023-11-15 ENCOUNTER — Telehealth: Payer: Self-pay | Admitting: Podiatry

## 2023-11-15 ENCOUNTER — Ambulatory Visit (INDEPENDENT_AMBULATORY_CARE_PROVIDER_SITE_OTHER)

## 2023-11-15 DIAGNOSIS — Z9889 Other specified postprocedural states: Secondary | ICD-10-CM

## 2023-11-15 DIAGNOSIS — Z01419 Encounter for gynecological examination (general) (routine) without abnormal findings: Secondary | ICD-10-CM

## 2023-11-15 DIAGNOSIS — Z472 Encounter for removal of internal fixation device: Secondary | ICD-10-CM

## 2023-11-15 NOTE — Telephone Encounter (Signed)
 DOS: 11/17/23  REMOVAL FIXATION DEEP K-WIRE/SCREW    EFFECTIVE DATE: 10/07/23  DEDUCTIBLE:  $257.00  REMAINING:  $231.33  OOP:   $9,350.00  REMAINING: $9,324.33   CO INSURANCE : 20%  PER THE [facility] PROVIDER PORTAL NO AUTH IS REQ FOR PROCEDURE CODE 54098  REF#  [id number]

## 2023-11-16 NOTE — Progress Notes (Signed)
 Subjective:   Patient ID: Vicki [name], female   DOB: 63 y.o.   MRN: [id number]   HPI Overall is doing better from having head trauma after surgery left first metatarsal but states the pin is still bothersome as the swelling reduces and she would like it removed   ROS      Objective:  Physical Exam  Neurovascular status was found to be intact negative Denna Haggard' sign noted range of motion around the first MPJ left is not restricted currently but there is a moderately sharp discomfort plantar aspect and the edema has reduced quite nicely from the surgery with good alignment     Assessment:  Patient has healing occurring from having had fracture of the first metatarsal head when she walked on the foot after the surgery with good clinical alignment except for irritation from the proximal     Plan:  H&P reviewed condition and at this point I have recommended pin removal.  I explained procedure risk and patient wants surgery and pin removal I reviewed with her the procedure and recovery and she would like to get it done as soon as possible and she is scheduled to have this done in the office.  I encouraged her to stay ambulatory and reviewed x-ray  X-rays indicate there is stability in the first metatarsal segment with pin that has impacted proximal and there is shortening of the first metatarsal bone but it is stable at this point

## 2023-11-17 ENCOUNTER — Ambulatory Visit (INDEPENDENT_AMBULATORY_CARE_PROVIDER_SITE_OTHER): Admitting: Podiatry

## 2023-11-17 ENCOUNTER — Encounter: Payer: Self-pay | Admitting: Podiatry

## 2023-11-17 DIAGNOSIS — Z472 Encounter for removal of internal fixation device: Secondary | ICD-10-CM | POA: Diagnosis not present

## 2023-11-20 NOTE — Progress Notes (Signed)
 Subjective:   Patient ID: Vicki [name], female   DOB: 63 y.o.   MRN: [id number]   HPI The patient presents stating she is ready to get this pin out of this left foot as the pain has become sharp at times and can be hard to walk on if she gets more active   ROS      Objective:  Physical Exam  Neurovascular status intact negative Celine Collard' sign noted left first metatarsal has good motion but there is some pain in the plantar aspect consistent with elongated pin secondary to trauma which caused it to embed     Assessment:  Chronic pin formation left foot     Plan:  Reviewed condition today I did sterile prep and I then injected with 60 mg like Marcaine mixture for anesthesia.  Patient taken to the operating room sterile prep applied and tourniquet was inflated to 250 mmHg.  Patient's left foot was identified and a approximate 3 cm incision was made over the offending pin.  The incision was deepened through subcutaneous tissue down the capsule and a linear capsular incision was performed.  Capsular tissue was sharply dissected off the underlying bone the pin was identified removed in toto and flushed.  I then went ahead and I sutured the skin with 5-0 nylon sterile dressing applied tourniquet released capillary fill noted to be immediate all toes left foot.  Patient tolerated the procedure well left the OR in satisfactory condition

## 2023-11-23 ENCOUNTER — Encounter

## 2023-11-23 ENCOUNTER — Encounter: Admitting: Podiatry

## 2023-11-27 DIAGNOSIS — J441 Chronic obstructive pulmonary disease with (acute) exacerbation: Secondary | ICD-10-CM | POA: Diagnosis not present

## 2023-11-29 DIAGNOSIS — Z87891 Personal history of nicotine dependence: Secondary | ICD-10-CM | POA: Diagnosis not present

## 2023-12-01 ENCOUNTER — Encounter: Payer: Self-pay | Admitting: Podiatrist

## 2023-12-01 ENCOUNTER — Ambulatory Visit (INDEPENDENT_AMBULATORY_CARE_PROVIDER_SITE_OTHER)

## 2023-12-01 ENCOUNTER — Ambulatory Visit

## 2023-12-01 DIAGNOSIS — Z472 Encounter for removal of internal fixation device: Secondary | ICD-10-CM

## 2023-12-01 DIAGNOSIS — Z9889 Other specified postprocedural states: Secondary | ICD-10-CM

## 2023-12-01 DIAGNOSIS — Z4802 Encounter for removal of sutures: Secondary | ICD-10-CM

## 2023-12-01 NOTE — Progress Notes (Signed)
 Chief Complaint  Patient presents with   Routine Post Op    POV # 1 DOS 4/11 REMOVAL PIN LT TOP OF FOOT (SUTURE REMOVAL) REGAL PT- Patient states is not in any pain incision site looks to be in a good healing state.     HPI: Patient is 63 y.o. female who presents today for postop follow-up.  Date of surgery 11/17/2023.  She states she is doing well her sutures are in place and she has had no trouble out of the foot.  Overall she is happy with her progress so far.  Allergies  Allergen Reactions   Codeine Nausea And Vomiting    Review of systems is negative except as noted in the HPI.  Denies nausea/ vomiting/ fevers/ chills or night sweats.   Denies difficulty breathing, denies calf pain or tenderness  Physical Exam  Patient is awake, alert, and oriented x 3.  In no acute distress.    Neurovascular status intact and unchanged from prior to surgery.  Well-healing surgical incision is noted.  Sutures are in place and once removed skin is well coapted.  No redness, no swelling, no drainage is noted.    Assessment: Status post left foot surgery 11/17/2023  Plan: Sutures were removed at today's visit.  She will be seen back for her next scheduled postop visit with Dr. Marvis Sluder on May 1.  Any problems or concerns arise prior to that visit she will call.

## 2023-12-06 DIAGNOSIS — R942 Abnormal results of pulmonary function studies: Secondary | ICD-10-CM | POA: Diagnosis not present

## 2023-12-06 DIAGNOSIS — Z7951 Long term (current) use of inhaled steroids: Secondary | ICD-10-CM | POA: Diagnosis not present

## 2023-12-06 DIAGNOSIS — J449 Chronic obstructive pulmonary disease, unspecified: Secondary | ICD-10-CM | POA: Diagnosis not present

## 2023-12-06 DIAGNOSIS — F17210 Nicotine dependence, cigarettes, uncomplicated: Secondary | ICD-10-CM | POA: Diagnosis not present

## 2023-12-07 ENCOUNTER — Ambulatory Visit (INDEPENDENT_AMBULATORY_CARE_PROVIDER_SITE_OTHER): Admitting: Podiatry

## 2023-12-07 ENCOUNTER — Other Ambulatory Visit: Payer: Self-pay | Admitting: Family Medicine

## 2023-12-07 ENCOUNTER — Encounter: Payer: Self-pay | Admitting: Podiatry

## 2023-12-07 DIAGNOSIS — M79675 Pain in left toe(s): Secondary | ICD-10-CM

## 2023-12-07 DIAGNOSIS — Z9889 Other specified postprocedural states: Secondary | ICD-10-CM

## 2023-12-07 DIAGNOSIS — B351 Tinea unguium: Secondary | ICD-10-CM | POA: Diagnosis not present

## 2023-12-07 DIAGNOSIS — F418 Other specified anxiety disorders: Secondary | ICD-10-CM

## 2023-12-07 DIAGNOSIS — M79674 Pain in right toe(s): Secondary | ICD-10-CM | POA: Diagnosis not present

## 2023-12-07 MED ORDER — ESCITALOPRAM OXALATE 20 MG PO TABS: 20.0000 mg | ORAL_TABLET | Freq: Every day | ORAL | 0 refills | Status: DC

## 2023-12-07 NOTE — Progress Notes (Unsigned)
 Left 1st toenial  Surgical site at k wire removal doing well f/up in 4 weeks

## 2023-12-07 NOTE — Telephone Encounter (Signed)
 Copied from CRM [id number]. Topic: Clinical - Medication Refill >> Dec 07, 2023  3:49 PM Magdalene School wrote: Most Recent Primary Care Visit:  Provider: TABORI, KATHERINE E  Department: [facility]  Visit Type: ACUTE  Date: 10/05/2023  Medication: escitalopram  (LEXAPRO ) 20 MG tablet  Has the patient contacted their pharmacy? No (Agent: If no, request that the patient contact the pharmacy for the refill. If patient does not wish to contact the pharmacy document the reason why and proceed with request.) (Agent: If yes, when and what did the pharmacy advise?)  Is this the correct pharmacy for this prescription? Yes If no, delete pharmacy and type the correct one.  This is the patient's preferred pharmacy:  [facility] - [place], [place] - [address] [place] [address] Phone: [phone] Fax: [phone]   Has the prescription been filled recently? No  Is the patient out of the medication? Yes  Has the patient been seen for an appointment in the last year OR does the patient have an upcoming appointment? Yes  Can we respond through MyChart? Yes  Agent: Please be advised that Rx refills may take up to 3 business days. We ask that you follow-up with your pharmacy.

## 2023-12-08 ENCOUNTER — Other Ambulatory Visit (HOSPITAL_COMMUNITY)
Admission: RE | Admit: 2023-12-08 | Discharge: 2023-12-08 | Disposition: A | Discharge status: Home / Self Care | Source: Ambulatory Visit | Attending: Obstetrics & Gynecology | Admitting: Obstetrics & Gynecology

## 2023-12-08 ENCOUNTER — Encounter: Payer: Self-pay | Admitting: Obstetrics & Gynecology

## 2023-12-08 ENCOUNTER — Ambulatory Visit: Admitting: Obstetrics & Gynecology

## 2023-12-08 VITALS — BP 134/80 | HR 80 | Ht 66.0 in | Wt 151.0 lb

## 2023-12-08 DIAGNOSIS — Z01419 Encounter for gynecological examination (general) (routine) without abnormal findings: Secondary | ICD-10-CM

## 2023-12-08 DIAGNOSIS — Z1339 Encounter for screening examination for other mental health and behavioral disorders: Secondary | ICD-10-CM

## 2023-12-08 DIAGNOSIS — Z1151 Encounter for screening for human papillomavirus (HPV): Secondary | ICD-10-CM | POA: Diagnosis not present

## 2023-12-08 NOTE — Progress Notes (Unsigned)
 GYNECOLOGY CLINIC ANNUAL PREVENTATIVE CARE ENCOUNTER NOTE  Subjective:   Vicki [name] is a 63 y.o. [phone] female here for a routine annual gynecologic exam.  Current complaints: last pap 2019 normal with cytology only.   Denies abnormal vaginal bleeding, discharge, pelvic pain, problems with intercourse or other gynecologic concerns. She has h/o high grade bladder cancer since 2022   Gynecologic History No LMP recorded. Patient is postmenopausal. Contraception: post menopausal status Last Pap: 2019. Results were: normal Last mammogram: 08/2023. Results were: normal  Obstetric History OB History  Gravida Para Term Preterm AB Living  4 3 3  1 3   SAB IAB Ectopic Multiple Live Births   1       # Outcome Date GA Lbr Len/2nd Weight Sex Type Anes PTL Lv  4 Term 1988     Vag-Spont     3 IAB 1986          2 Term 85     Vag-Spont     1 Term 7     Vag-Spont       Past Medical History:  Diagnosis Date   Acquired hallux valgus of left foot    Bladder tumor    Callous ulcer (HCC)    Depression with anxiety    DEPRESSIVE DISORDER NOT ELSEWHERE CLASSIFIED    Diabetes mellitus without complication (HCC)    [employer]rees[employer] 10/13/2020   Dyspnea    Foot deformity, acquired    Grief reaction    History of kidney stones    Hyperlipidemia    [employer]reesia 10/13/2020   Hypertension    [employer] 10/13/2020   Myalgia and myositis, unspecified    Onychomycosis    Oral thrush    Pneumonia    4 months ago   Pre-diabetes    Rotator cuff syndrome of left shoulder    Rotator cuff tear, right    Shortness of breath    SHOULDER PAIN, RIGHT    Sleep apnea    [employer]reesia 10/13/2020   Sleep disorder    Urinary hesitancy     Past Surgical History:  Procedure Laterality Date   cryotherphy  1986   TRANSURETHRAL RESECTION OF BLADDER TUMOR N/A 12/21/2020   Procedure: TRANSURETHRAL RESECTION OF BLADDER TUMOR (TURBT)WITH CYSTOSTOPY/ BILATERAL RETROGRADE PYELOGRAM WITH LEFT STENT PLACEMENT;  Surgeon:  Florencio Hunting, MD;  Location: [facility];  Service: Urology;  Laterality: N/A;  GENERAL ANESTHESIA WITHH PARALYSIS   TUBAL LIGATION      Current Outpatient Medications on File Prior to Visit  Medication Sig Dispense Refill   ASPIRIN  81 PO Take 81 mg by mouth daily.     atorvastatin  (LIPITOR) 10 MG tablet Take 1 tablet (10 mg total) by mouth daily. 90 tablet 1   budesonide -formoterol  (SYMBICORT ) 160-4.5 MCG/ACT inhaler Inhale 2 puffs into the lungs 2 (two) times daily.     Calcium  Carb-Cholecalciferol (CALCIUM  600/VITAMIN D3 PO) Take 1 tablet by mouth daily.     diclofenac  Sodium (VOLTAREN ) 1 % GEL Apply 4gms topically four times a day as needed 100 g 11   doxycycline  (VIBRA -TABS) 100 MG tablet Take 1 tablet (100 mg total) by mouth 2 (two) times daily. 20 tablet 0   escitalopram  (LEXAPRO ) 20 MG tablet Take 1 tablet (20 mg total) by mouth daily. 30 tablet 0   fluticasone  (FLONASE ) 50 MCG/ACT nasal spray Use 2 sprays into each nostril every day 160 mL 1   hydrochlorothiazide  (MICROZIDE ) 12.5 MG capsule Take 1 capsule (12.5 mg total) by mouth daily. 90  capsule 1   hydrOXYzine  (ATARAX ) 10 MG tablet Take 1 tablet (10 mg total) by mouth at bedtime as needed. 30 tablet 11   lisinopril  (ZESTRIL ) 10 MG tablet Take 1 tablet (10 mg total) by mouth daily. 90 tablet 1   meclizine  (ANTIVERT ) 12.5 MG tablet Take 1 tablet (12.5 mg total) by mouth 3 (three) times daily as needed for dizziness. 30 tablet 0   metFORMIN  (GLUCOPHAGE ) 500 MG tablet Take 1 tablet (500 mg total) by mouth daily. 90 tablet 1   Omega-3 Fatty Acids (FISH OIL) 1000 MG CAPS Take by mouth.     predniSONE  (DELTASONE ) 10 MG tablet 3 tabs x3 days and then 2 tabs x3 days and then 1 tab x3 days.  Take w/ food. 18 tablet 0   vitamin E 180 MG (400 UNITS) capsule Take 400 Units by mouth daily.     Current Facility-Administered Medications on File Prior to Visit  Medication Dose Route Frequency Provider Last Rate Last Admin   gemcitabine  (GEMZAR )  chemo syringe for bladder instillation 2,000 mg  2,000 mg Bladder Instillation Once Florencio Hunting, MD        Allergies  Allergen Reactions   Codeine Nausea And Vomiting    Social History   Socioeconomic History   Marital status: Widowed    Spouse name: Not on file   Number of children: Not on file   Years of education: Not on file   Highest education level: Not on file  Occupational History   Not on file  Tobacco Use   Smoking status: Every Day    Current packs/day: 0.75    Types: Cigarettes   Smokeless tobacco: Never   Tobacco comments:    increased smoking again due to stress  Vaping Use   Vaping status: Never Used  Substance and Sexual Activity   Alcohol  use: No   Drug use: No   Sexual activity: Not Currently    Birth control/protection: Surgical, Post-menopausal  Other Topics Concern   Not on file  Social History Narrative   Not on file   Social Drivers of Health   Financial Resource Strain: Low Risk  (03/01/2023)   Overall Financial Resource Strain (CARDIA)    Difficulty of Paying Living Expenses: Not hard at all  Food Insecurity: No Food Insecurity (03/01/2023)   Hunger Vital Sign    Worried About Running Out of Food in the Last Year: Never true    Ran Out of Food in the Last Year: Never true  Transportation Needs: No Transportation Needs (03/01/2023)   PRAPARE - Administrator, Civil Service (Medical): No    Lack of Transportation (Non-Medical): No  Physical Activity: Insufficiently Active (03/01/2023)   Exercise Vital Sign    Days of Exercise per Week: 2 days    Minutes of Exercise per Session: 40 min  Stress: Stress Concern Present (03/01/2023)   [employer] of Occupational Health - Occupational Stress Questionnaire    Feeling of Stress : Rather much  Social Connections: Moderately Integrated (03/01/2023)   Social Connection and Isolation Panel [NHANES]    Frequency of Communication with Friends and Family: Twice a week    Frequency of  Social Gatherings with Friends and Family: Once a week    Attends Religious Services: More than 4 times per year    Active Member of [employer] or Organizations: Yes    Attends Banker Meetings: More than 4 times per year    Marital Status: Widowed  Intimate  Partner Violence: Not At Risk (03/01/2023)   Humiliation, Afraid, Rape, and Kick questionnaire    Fear of Current or Ex-Partner: No    Emotionally Abused: No    Physically Abused: No    Sexually Abused: No    Family History  Problem Relation Age of Onset   Heart disease Mother    Diabetes Mother    Cancer Mother        breast and ovarian   Stroke Mother    Breast cancer Mother        diagnosed in her 3's   Heart disease Father     The following portions of the patient's history were reviewed and updated as appropriate: allergies, current medications, past family history, past medical history, past social history, past surgical history and problem list.  Review of Systems Respiratory: negative Cardiovascular: negative Gastrointestinal: negative Genitourinary:negative   Objective:  BP 134/80   Pulse 80   Ht 5\' 6"  (1.676 m)   Wt 151 lb (68.5 kg)   BMI 24.37 kg/m  CONSTITUTIONAL: Well-developed, well-nourished female in no acute distress.  HENT:  Normocephalic, atraumatic, External right and left ear normal. Oropharynx is clear and moist EYES: Conjunctivae and EOM are normal. Pupils are equal, round, and reactive to light. No scleral icterus.  NECK: Normal range of motion, supple, no masses.  Normal thyroid .  SKIN: Skin is warm and dry. No rash noted. Not diaphoretic. No erythema. No pallor. NEUROLGIC: Alert and oriented to person, place, and time. Normal reflexes, muscle tone coordination. No cranial nerve deficit noted. PSYCHIATRIC: Normal mood and affect. Normal behavior. Normal judgment and thought content. CARDIOVASCULAR: Normal heart rate noted, regular rhythm RESPIRATORY: Clear to auscultation  bilaterally. Effort and breath sounds normal, no problems with respiration noted. BREASTS: Symmetric in size. No masses, skin changes, nipple drainage, or lymphadenopathy. ABDOMEN: Soft, normal bowel sounds, no distention noted.  No tenderness, rebound or guarding.  PELVIC: Normal appearing external genitalia; normal appearing vaginal mucosa and cervix.  No abnormal discharge noted.  Pap smear obtained.  Normal uterine size, no other palpable masses, no uterine or adnexal tenderness. MUSCULOSKELETAL: Normal range of motion. No tenderness.  No cyanosis, clubbing, or edema.     Assessment:  Annual gynecologic examination with pap smear  H/o bladder cancer Plan:  Will follow up results of pap smear and manage accordingly. Mammogram result reviewed Routine preventative health maintenance measures emphasized. Please refer to After Visit Summary for other counseling recommendations.  Urged smoking cessation  Onnie Bilis, MD Attending Obstetrician & Gynecologist Center for [employer], [facility]

## 2023-12-08 NOTE — Progress Notes (Signed)
 63 y.o. GYN presents for AEX/PAP

## 2023-12-09 ENCOUNTER — Encounter: Payer: Self-pay | Admitting: Podiatry

## 2023-12-11 LAB — CERVICOVAGINAL ANCILLARY ONLY
Chlamydia: NEGATIVE
Comment: NEGATIVE
Comment: NORMAL
Neisseria Gonorrhea: NEGATIVE

## 2023-12-12 LAB — CYTOLOGY - PAP
Comment: NEGATIVE
Diagnosis: NEGATIVE
High risk HPV: NEGATIVE

## 2023-12-14 ENCOUNTER — Other Ambulatory Visit: Payer: Self-pay | Admitting: Podiatry

## 2023-12-21 DIAGNOSIS — F17210 Nicotine dependence, cigarettes, uncomplicated: Secondary | ICD-10-CM | POA: Diagnosis not present

## 2023-12-21 DIAGNOSIS — R942 Abnormal results of pulmonary function studies: Secondary | ICD-10-CM | POA: Diagnosis not present

## 2023-12-21 DIAGNOSIS — J449 Chronic obstructive pulmonary disease, unspecified: Secondary | ICD-10-CM | POA: Diagnosis not present

## 2023-12-27 DIAGNOSIS — J441 Chronic obstructive pulmonary disease with (acute) exacerbation: Secondary | ICD-10-CM | POA: Diagnosis not present

## 2023-12-28 ENCOUNTER — Ambulatory Visit (INDEPENDENT_AMBULATORY_CARE_PROVIDER_SITE_OTHER): Payer: Medicare HMO | Admitting: Family Medicine

## 2023-12-28 VITALS — BP 128/78 | HR 85 | Temp 97.9°F | Ht 66.0 in | Wt 152.1 lb

## 2023-12-28 DIAGNOSIS — Z0000 Encounter for general adult medical examination without abnormal findings: Secondary | ICD-10-CM | POA: Diagnosis not present

## 2023-12-28 DIAGNOSIS — R7303 Prediabetes: Secondary | ICD-10-CM | POA: Diagnosis not present

## 2023-12-28 DIAGNOSIS — Z23 Encounter for immunization: Secondary | ICD-10-CM

## 2023-12-28 DIAGNOSIS — F418 Other specified anxiety disorders: Secondary | ICD-10-CM

## 2023-12-28 DIAGNOSIS — J309 Allergic rhinitis, unspecified: Secondary | ICD-10-CM

## 2023-12-28 DIAGNOSIS — I10 Essential (primary) hypertension: Secondary | ICD-10-CM | POA: Diagnosis not present

## 2023-12-28 DIAGNOSIS — E785 Hyperlipidemia, unspecified: Secondary | ICD-10-CM | POA: Diagnosis not present

## 2023-12-28 DIAGNOSIS — R059 Cough, unspecified: Secondary | ICD-10-CM

## 2023-12-28 MED ORDER — LISINOPRIL 10 MG PO TABS: 10.0000 mg | ORAL_TABLET | Freq: Every day | ORAL | 1 refills | Status: DC

## 2023-12-28 MED ORDER — ATORVASTATIN CALCIUM 10 MG PO TABS: 10.0000 mg | ORAL_TABLET | Freq: Every day | ORAL | 1 refills | Status: DC

## 2023-12-28 MED ORDER — METFORMIN HCL 500 MG PO TABS: 500.0000 mg | ORAL_TABLET | Freq: Every day | ORAL | 1 refills | Status: DC

## 2023-12-28 MED ORDER — FLUTICASONE PROPIONATE 50 MCG/ACT NA SUSP: NASAL | 1 refills | Status: DC

## 2023-12-28 MED ORDER — HYDROCHLOROTHIAZIDE 12.5 MG PO CAPS: 12.5000 mg | ORAL_CAPSULE | Freq: Every day | ORAL | 1 refills | Status: DC

## 2023-12-28 MED ORDER — ESCITALOPRAM OXALATE 20 MG PO TABS
20.0000 mg | ORAL_TABLET | Freq: Every day | ORAL | 2 refills | Status: AC
Start: 2023-12-28 — End: ?

## 2023-12-28 NOTE — Progress Notes (Signed)
 Subjective:  Patient ID: Vicki [name], female    DOB: Oct 11, 1960  Age: 63 y.o. MRN: [id number]  CC:  Chief Complaint  Patient presents with   Annual Exam    Med refill    HPI Vicki [name] presents for Annual Exam  PCP, me Gynecology, Dr. Willey Harrier, exam May 2, Pap testing negative for intraepithelial lesion.  HPV negative. Podiatry, Dr. Celia Coles, history of hallux valgus deformity, status post first metatarsal surgery, onychomycosis. Pulmonary at [facility] [place]? Appt planned yesterday, having to reschedule on 5/29.  Nephrology - Dr. Yvonnie Heritage - eval in 2023. Hx of urothelial carcinoma, nephrolithiasis.  Urology - Dr. Rozanne Corners, hx of urothelial carcinoma, 2022.  Lat visit in 01/25/23, 6 month follow up planned.   Prediabetes: History of prediabetes with borderline diabetes level with A1c of 6.5 in November.  Had been taking metformin  500 mg daily. No side effects, no recent readings.  Lab Results  Component Value Date   HGBA1C 6.5 06/28/2023   Wt Readings from Last 3 Encounters:  12/28/23 152 lb 2 oz (69 kg)  12/08/23 151 lb (68.5 kg)  10/05/23 149 lb 4 oz (67.7 kg)   Hypertension: Dizziness discussed in the past, improved with increased water intake, and less caffeine intake.Vicki [name]  Hypertension treated hydrochlorothiazide  12.5 mg daily, lisinopril  10mg  every day.  Home readings: none.  BP Readings from Last 3 Encounters:  12/28/23 128/78  12/08/23 134/80  10/05/23 114/68   Lab Results  Component Value Date   CREATININE 0.67 07/26/2023   Nicotine  addiction/tobacco abuse Still smoking, cessation has been discussed including resources provided.  Yearly lung cancer screening with pulmonary at [facility] reported previously. About 1 ppd. Not quite ready to quit.   Hyperlipidemia: Previously treated with Lipitor 10 mg daily, elevated LDL in November, recommend trial of 20 mg dose with doubling her 10 mg pills. Has remained on 1 pill. No side effects.   Lab  Results  Component Value Date   CHOL 205 (H) 06/28/2023   HDL 45.00 06/28/2023   LDLCALC 134 (H) 06/28/2023   LDLDIRECT 120.0 10/27/2022   TRIG 126.0 06/28/2023   CHOLHDL 5 06/28/2023   Lab Results  Component Value Date   ALT 15 07/26/2023   AST 15 07/26/2023   ALKPHOS 59 07/26/2023   BILITOT 0.6 07/26/2023   Depression: Depression with anxiety, treated with Lexapro  20 mg daily, support animal at home with letter previously provided for apartment complex.  Attempted buspirone  in the past which was not helpful.  Stable on Lexapro .  Hydroxyzine  as needed for sleep 1/2 to 1 pill which has been effective without any side effects. Meds still working well.      12/08/2023    8:24 AM 09/20/2023    4:21 PM 06/28/2023    1:38 PM 03/01/2023   11:42 AM 11/07/2022    1:35 PM  Depression screen PHQ 2/9  Decreased Interest 2 0 1 3 3   Down, Depressed, Hopeless 0 0 1 3 0  PHQ - 2 Score 2 0 2 6 3   Altered sleeping 0 0 1 2 3   Tired, decreased energy 3 0 1 2 1   Change in appetite 0 0 1 0 0  Feeling bad or failure about yourself  0 0 1 0 0  Trouble concentrating 0 0 0 1 0  Moving slowly or fidgety/restless 0 0 0 0   Suicidal thoughts 0 0 0 0 0  PHQ-9 Score 5 0 6 11 7   Difficult doing  work/chores Not difficult at all  Not difficult at all Somewhat difficult Somewhat difficult      Health Maintenance  Topic Date Due   DTaP/Tdap/Td (1 - Tdap) Never done   Pneumococcal Vaccine 50-71 Years old (1 of 2 - PCV) Never done   Zoster Vaccines- Shingrix (1 of 2) Never done   COVID-19 Vaccine (3 - Pfizer risk series) 06/02/2020   Hepatitis C Screening  02/29/2024 (Originally 11/26/1978)   Medicare Annual Wellness (AWV)  02/29/2024   MAMMOGRAM  03/05/2024   INFLUENZA VACCINE  03/08/2024   Colonoscopy  06/16/2026   Cervical Cancer Screening (HPV/Pap Cotest)  12/07/2028   HIV Screening  Completed   HPV VACCINES  Aged Out   Meningococcal B Vaccine  Aged Out  Recent pap as above  Colonoscopy in 2017 -  repeat 2 years per that note, reports negative home test in about 2020. More recent test possible and told had no polyps and repeat in 10 years?  [facility]lt [facility]  - will request records.  Due for urology follow up as above.  Regular lung CA screenings with pulmonary - [facility]  Mammogram 09/06/23 - repeat 1 year.   Immunization History  Administered Date(s) Administered   Influenza Split 05/29/2012   Influenza Whole 06/11/2010   PFIZER(Purple Top)SARS-COV-2 Vaccination 04/14/2020, 05/05/2020  Covid booster - declines Declines flu vaccine - gets sick from using. Pneumonia vaccine -recommended - declined.  Declines shingrix  RSV vaccine - declines.  Tdap - unknown. Agrees to today.    No results found.optho -2 years ago - plans to schedule.   Dental:edentuous - false teeth only.   Alcohol : none  Tobacco: as above - 1 ppd  Exercise:gym in past - less recently - plans to restart 2 days per week. Various exercises.    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   Influenza A 09/26/2018   COPD with acute exacerbation (HCC) 09/26/2018   Neck pain 02/05/2016   Leg swelling 01/19/2016   Dry cough 02/11/2015   Fatigue 02/11/2015   Prediabetes 10/29/2014   Polyuria 10/28/2014   Hyperglycemia 10/28/2014   Right hip pain 03/21/2014   Edema, lower extremity 03/21/2014   Low back pain 12/17/2013   Right knee pain 12/17/2013   Chest pain, central 11/05/2013   Lateral epicondylitis of left elbow 11/05/2013   Obstructive chronic bronchitis without exacerbation (HCC) 08/23/2013   Wrist fracture, right 07/08/2013   Sciatica 05/28/2013   Grief reaction 04/02/2013   Myalgia and myositis, unspecified 03/07/2012   Rotator cuff syndrome of left shoulder 03/07/2012   Depression with anxiety 03/07/2012   Foot deformity, acquired 06/21/2011   Callous ulcer (HCC) 06/21/2011   Acquired hallux valgus of  left foot 05/10/2011   Sleep disorder 02/04/2011   Rotator cuff tear, right 09/03/2010   SHOULDER PAIN, RIGHT 08/20/2010   TOBACCO ABUSE 06/11/2010   DEPRESSIVE DISORDER NOT ELSEWHERE CLASSIFIED 06/11/2010   Past Medical History:  Diagnosis Date   Acquired hallux valgus of left foot    Bladder tumor    Callous ulcer (HCC)    Depression with anxiety    DEPRESSIVE DISORDER NOT ELSEWHERE CLASSIFIED    Diabetes mellitus without complication (HCC)    Phreesia 10/13/2020   Dyspnea    Foot deformity, acquired    Grief reaction    History of kidney stones    Hyperlipidemia    Phreesia 10/13/2020   Hypertension  Phreesia 10/13/2020   Myalgia and myositis, unspecified    Onychomycosis    Oral thrush    Pneumonia    4 months ago   Pre-diabetes    Rotator cuff syndrome of left shoulder    Rotator cuff tear, right    Shortness of breath    SHOULDER PAIN, RIGHT    Sleep apnea    Phreesia 10/13/2020   Sleep disorder    Urinary hesitancy    Past Surgical History:  Procedure Laterality Date   cryotherphy  1986   TRANSURETHRAL RESECTION OF BLADDER TUMOR N/A 12/21/2020   Procedure: TRANSURETHRAL RESECTION OF BLADDER TUMOR (TURBT)WITH CYSTOSTOPY/ BILATERAL RETROGRADE PYELOGRAM WITH LEFT STENT PLACEMENT;  Surgeon: Florencio Hunting, MD;  Location: [facility];  Service: Urology;  Laterality: N/A;  GENERAL ANESTHESIA WITHH PARALYSIS   TUBAL LIGATION     Allergies  Allergen Reactions   Codeine Nausea And Vomiting   Prior to Admission medications   Medication Sig Start Date End Date Taking? Authorizing Provider  ASPIRIN  81 PO Take 81 mg by mouth daily.   Yes [provider]  atorvastatin  (LIPITOR) 10 MG tablet Take 1 tablet (10 mg total) by mouth daily. 06/28/23  Yes Benjiman Bras, MD  budesonide -formoterol  (SYMBICORT ) 160-4.5 MCG/ACT inhaler Inhale 2 puffs into the lungs 2 (two) times daily.   Yes [provider]  Calcium  Carb-Cholecalciferol (CALCIUM  600/VITAMIN D3 PO)  Take 1 tablet by mouth daily.   Yes [provider]  diclofenac  Sodium (VOLTAREN ) 1 % GEL Apply 4gms topically four times a day as needed 10/25/22  Yes Benjiman Bras, MD  doxycycline  (VIBRA -TABS) 100 MG tablet Take 1 tablet (100 mg total) by mouth 2 (two) times daily. 09/20/23  Yes Benjiman Bras, MD  escitalopram  (LEXAPRO ) 20 MG tablet Take 1 tablet (20 mg total) by mouth daily. 12/07/23  Yes Benjiman Bras, MD  fluticasone  (FLONASE ) 50 MCG/ACT nasal spray Use 2 sprays into each nostril every day 01/30/23  Yes Benjiman Bras, MD  hydrochlorothiazide  (MICROZIDE ) 12.5 MG capsule Take 1 capsule (12.5 mg total) by mouth daily. 06/28/23  Yes Benjiman Bras, MD  hydrOXYzine  (ATARAX ) 10 MG tablet Take 1 tablet (10 mg total) by mouth at bedtime as needed. 11/07/23  Yes Benjiman Bras, MD  lisinopril  (ZESTRIL ) 10 MG tablet Take 1 tablet (10 mg total) by mouth daily. 06/28/23  Yes Benjiman Bras, MD  meclizine  (ANTIVERT ) 12.5 MG tablet Take 1 tablet (12.5 mg total) by mouth 3 (three) times daily as needed for dizziness. 07/26/23  Yes Robinson, John K, PA-C  metFORMIN  (GLUCOPHAGE ) 500 MG tablet Take 1 tablet (500 mg total) by mouth daily. 06/28/23  Yes Benjiman Bras, MD  Omega-3 Fatty Acids (FISH OIL) 1000 MG CAPS Take by mouth.   Yes [provider]  predniSONE  (DELTASONE ) 10 MG tablet 3 tabs x3 days and then 2 tabs x3 days and then 1 tab x3 days.  Take w/ food. 10/05/23  Yes Tabori, Katherine E, MD  vitamin E 180 MG (400 UNITS) capsule Take 400 Units by mouth daily.   Yes [provider]   Social History   Socioeconomic History   Marital status: Widowed    Spouse name: Not on file   Number of children: Not on file   Years of education: Not on file   Highest education level: Not on file  Occupational History   Not on file  Tobacco Use   Smoking status: Every Day  Current packs/day: 0.75    Types: Cigarettes   Smokeless tobacco: Never   Tobacco  comments:    increased smoking again due to stress  Vaping Use   Vaping status: Never Used  Substance and Sexual Activity   Alcohol  use: No   Drug use: No   Sexual activity: Not Currently    Birth control/protection: Surgical, Post-menopausal  Other Topics Concern   Not on file  Social History Narrative   Not on file   Social Drivers of Health   Financial Resource Strain: Low Risk  (03/01/2023)   Overall Financial Resource Strain (CARDIA)    Difficulty of Paying Living Expenses: Not hard at all  Food Insecurity: No Food Insecurity (03/01/2023)   Hunger Vital Sign    Worried About Running Out of Food in the Last Year: Never true    Ran Out of Food in the Last Year: Never true  Transportation Needs: No Transportation Needs (03/01/2023)   PRAPARE - Administrator, Civil Service (Medical): No    Lack of Transportation (Non-Medical): No  Physical Activity: Insufficiently Active (03/01/2023)   Exercise Vital Sign    Days of Exercise per Week: 2 days    Minutes of Exercise per Session: 40 min  Stress: Stress Concern Present (03/01/2023)   [employer] of Occupational Health - Occupational Stress Questionnaire    Feeling of Stress : Rather much  Social Connections: Moderately Integrated (03/01/2023)   Social Connection and Isolation Panel [NHANES]    Frequency of Communication with Friends and Family: Twice a week    Frequency of Social Gatherings with Friends and Family: Once a week    Attends Religious Services: More than 4 times per year    Active Member of [employer] or Organizations: Yes    Attends Banker Meetings: More than 4 times per year    Marital Status: Widowed  Intimate Partner Violence: Not At Risk (03/01/2023)   Humiliation, Afraid, Rape, and Kick questionnaire    Fear of Current or Ex-Partner: No    Emotionally Abused: No    Physically Abused: No    Sexually Abused: No    Review of Systems 13 point review of systems per patient health  survey noted.  Negative other than as indicated above or in HPI.    Objective:   Vitals:   12/28/23 1408  BP: 128/78  Pulse: 85  Temp: 97.9 F (36.6 C)  TempSrc: Temporal  SpO2: 95%  Weight: 152 lb 2 oz (69 kg)  Height: 5\' 6"  (1.676 m)     Physical Exam Vitals reviewed.  Constitutional:      Appearance: She is well-developed.  HENT:     Head: Normocephalic and atraumatic.     Right Ear: External ear normal.     Left Ear: External ear normal.  Eyes:     Conjunctiva/sclera: Conjunctivae normal.     Pupils: Pupils are equal, round, and reactive to light.  Neck:     Thyroid : No thyromegaly.  Cardiovascular:     Rate and Rhythm: Normal rate and regular rhythm.     Heart sounds: Normal heart sounds. No murmur heard. Pulmonary:     Effort: Pulmonary effort is normal. No respiratory distress.     Breath sounds: Normal breath sounds. No wheezing.  Abdominal:     General: Bowel sounds are normal.     Palpations: Abdomen is soft.     Tenderness: There is no abdominal tenderness.  Musculoskeletal:  General: No tenderness. Normal range of motion.     Cervical back: Normal range of motion and neck supple.  Lymphadenopathy:     Cervical: No cervical adenopathy.  Skin:    General: Skin is warm and dry.     Findings: No rash.  Neurological:     Mental Status: She is alert and oriented to person, place, and time.  Psychiatric:        Behavior: Behavior normal.        Thought Content: Thought content normal.        Assessment & Plan:  Vicki [name] is a 63 y.o. female . Annual physical exam  - -anticipatory guidance as below in AVS, screening labs above. Health maintenance items as above in HPI discussed/recommended as applicable.   - Recommended follow-up with urology, 55-month follow-up recommended last June.  - Requested records of last colonoscopy as she reports there were no polyps on recent test but most recent colonoscopy I see was from 2017 with  multiple polyps.  - Continue follow-up with pulmonary for lung cancer screening, smoking cessation discussed, recommended, handout given on steps for quitting smoking.  Not ready to quit at this time.  Essential hypertension - Plan: Lipid panel  -  Stable, tolerating current regimen. Medications refilled. Labs pending as above.   Hyperlipidemia, unspecified hyperlipidemia type - Plan: Comprehensive metabolic panel with GFR, Lipid panel  - Check lipids, likely will increase Lipitor dose to 20 mg daily based on prior readings for improved control.  Prediabetes - Plan: Hemoglobin A1c Continue metformin , check A1c and adjust plan accordingly  Depression with anxiety Stable with current dose Lexapro , continue same  Need for diphtheria-tetanus-pertussis (Tdap) vaccine - Plan: Tdap vaccine greater than or equal to 7yo IM   No orders of the defined types were placed in this encounter.  Patient Instructions  Call urology - Dr. Rozanne Corners for follow up [phone]. Planned 6 months at your last visit in June 2024.  Please let me know if we can help when you are ready to quit smoking,  If cholesterol is elevated, will need to try higher dose of meds. No other changes for now.  Follow-up with your eye specialist. Take care!  Preventive Care 69-49 Years Old, Female Preventive care refers to lifestyle choices and visits with your health care provider that can promote health and wellness. Preventive care visits are also called wellness exams. What can I expect for my preventive care visit? Counseling Your health care provider may ask you questions about your: Medical history, including: Past medical problems. Family medical history. Pregnancy history. Current health, including: Menstrual cycle. Method of birth control. Emotional well-being. Home life and relationship well-being. Sexual activity and sexual health. Lifestyle, including: Alcohol , nicotine  or tobacco, and drug use. Access to  firearms. Diet, exercise, and sleep habits. Work and work Astronomer. Sunscreen use. Safety issues such as seatbelt and bike helmet use. Physical exam Your health care provider will check your: Height and weight. These may be used to calculate your BMI (body mass index). BMI is a measurement that tells if you are at a healthy weight. Waist circumference. This measures the distance around your waistline. This measurement also tells if you are at a healthy weight and may help predict your risk of certain diseases, such as type 2 diabetes and high blood pressure. Heart rate and blood pressure. Body temperature. Skin for abnormal spots. What immunizations do I need?  Vaccines are usually given at various ages, according to a  schedule. Your health care provider will recommend vaccines for you based on your age, medical history, and lifestyle or other factors, such as travel or where you work. What tests do I need? Screening Your health care provider may recommend screening tests for certain conditions. This may include: Lipid and cholesterol levels. Diabetes screening. This is done by checking your blood sugar (glucose) after you have not eaten for a while (fasting). Pelvic exam and Pap test. Hepatitis B test. Hepatitis C test. HIV (human immunodeficiency virus) test. STI (sexually transmitted infection) testing, if you are at risk. Lung cancer screening. Colorectal cancer screening. Mammogram. Talk with your health care provider about when you should start having regular mammograms. This may depend on whether you have a family history of breast cancer. BRCA-related cancer screening. This may be done if you have a family history of breast, ovarian, tubal, or peritoneal cancers. Bone density scan. This is done to screen for osteoporosis. Talk with your health care provider about your test results, treatment options, and if necessary, the need for more tests. Follow these instructions at  home: Eating and drinking  Eat a diet that includes fresh fruits and vegetables, whole grains, lean protein, and low-fat dairy products. Take vitamin and mineral supplements as recommended by your health care provider. Do not drink alcohol  if: Your health care provider tells you not to drink. You are pregnant, may be pregnant, or are planning to become pregnant. If you drink alcohol : Limit how much you have to 0-1 drink a day. Know how much alcohol  is in your drink. In the [place] one drink equals one 12 oz bottle of beer (355 mL), one 5 oz glass of wine (148 mL), or one 1 oz glass of hard liquor (44 mL). Lifestyle Brush your teeth every morning and night with fluoride toothpaste. Floss one time each day. Exercise for at least 30 minutes 5 or more days each week. Do not use any products that contain nicotine  or tobacco. These products include cigarettes, chewing tobacco, and vaping devices, such as e-cigarettes. If you need help quitting, ask your health care provider. Do not use drugs. If you are sexually active, practice safe sex. Use a condom or other form of protection to prevent STIs. If you do not wish to become pregnant, use a form of birth control. If you plan to become pregnant, see your health care provider for a prepregnancy visit. Take aspirin  only as told by your health care provider. Make sure that you understand how much to take and what form to take. Work with your health care provider to find out whether it is safe and beneficial for you to take aspirin  daily. Find healthy ways to manage stress, such as: Meditation, yoga, or listening to music. Journaling. Talking to a trusted person. Spending time with friends and family. Minimize exposure to UV radiation to reduce your risk of skin cancer. Safety Always wear your seat belt while driving or riding in a vehicle. Do not drive: If you have been drinking alcohol . Do not ride with someone who has been drinking. When you are  tired or distracted. While texting. If you have been using any mind-altering substances or drugs. Wear a helmet and other protective equipment during sports activities. If you have firearms in your house, make sure you follow all gun safety procedures. Seek help if you have been physically or sexually abused. What's next? Visit your health care provider once a year for an annual wellness visit. Ask your health care  provider how often you should have your eyes and teeth checked. Stay up to date on all vaccines. This information is not intended to replace advice given to you by your health care provider. Make sure you discuss any questions you have with your health care provider. Document Revised: 01/20/2021 Document Reviewed: 01/20/2021 Elsevier Patient Education  2024 Elsevier Inc.  Steps to Quit Smoking Smoking tobacco is the leading cause of preventable death. It can affect almost every organ in the body. Smoking puts you and those around you at risk for developing many serious chronic diseases. Quitting smoking can be very challenging. Do not get discouraged if you are not successful the first time. Some people need to make many attempts to quit before they achieve long-term success. Do your best to stick to your quit plan, and talk with your health care provider if you have any questions or concerns. How do I get ready to quit? When you decide to quit smoking, create a plan to help you succeed. Before you quit: Pick a date to quit. Set a date within the next 2 weeks to give you time to prepare. Write down the reasons why you are quitting. Keep this list in places where you will see it often. Tell your family, friends, and co-workers that you are quitting. Support from people you are close to can make quitting easier. Talk with your health care provider about your options for quitting smoking. Find out what treatment options are covered by your health insurance. Identify people, places,  things, and activities that make you want to smoke (triggers). Avoid them. What first steps can I take to quit smoking? Throw away all cigarettes at home, at work, and in your car. Throw away smoking accessories, such as Set designer. Clean your car. Make sure to empty the ashtray. Clean your home, including curtains and carpets. What strategies can I use to quit smoking? Talk with your health care provider about combining strategies, such as taking medicines while you are also receiving in-person counseling. Using these two strategies together makes you more likely to succeed in quitting than if you used either strategy on its own. If you are pregnant or breastfeeding, talk with your health care provider about finding counseling or other support strategies to quit smoking. Do not take medicine to help you quit smoking unless your health care provider tells you to. Quit right away Quit smoking completely, instead of gradually reducing how much you smoke over a period of time. Stopping smoking right away may be more successful than gradually quitting. Attend in-person counseling to help you build problem-solving skills. You are more likely to succeed in quitting if you attend counseling sessions regularly. Even short sessions of 10 minutes can be effective. Take medicine You may take medicines to help you quit smoking. Some medicines require a prescription. You can also purchase over-the-counter medicines. Medicines may have nicotine  in them to replace the nicotine  in cigarettes. Medicines may: Help to stop cravings. Help to relieve withdrawal symptoms. Your health care provider may recommend: Nicotine  patches, gum, or lozenges. Nicotine  inhalers or sprays. Non-nicotine  medicine that you take by mouth. Find resources Find resources and support systems that can help you quit smoking and remain smoke-free after you quit. These resources are most helpful when you use them often. They  include: Online chats with a Veterinary surgeon. Telephone quitlines. Printed Materials engineer. Support groups or group counseling. Text messaging programs. Mobile phone apps or applications. Use apps that can help you stick  to your quit plan by providing reminders, tips, and encouragement. Examples of free services include Quit Guide from the CDC and smokefree.gov  What can I do to make it easier to quit?  Reach out to your family and friends for support and encouragement. Call telephone quitlines, such as 1-800-QUIT-NOW, reach out to support groups, or work with a counselor for support. Ask people who smoke to avoid smoking around you. Avoid places that trigger you to smoke, such as bars, parties, or smoke-break areas at work. Spend time with people who do not smoke. Lessen the stress in your life. Stress can be a smoking trigger for some people. To lessen stress, try: Exercising regularly. Doing deep-breathing exercises. Doing yoga. Meditating. What benefits will I see if I quit smoking? Over time, you should start to see positive results, such as: Improved sense of smell and taste. Decreased coughing and sore throat. Slower heart rate. Lower blood pressure. Clearer and healthier skin. The ability to breathe more easily. Fewer sick days. Summary Quitting smoking can be very challenging. Do not get discouraged if you are not successful the first time. Some people need to make many attempts to quit before they achieve long-term success. When you decide to quit smoking, create a plan to help you succeed. Quit smoking right away, not slowly over a period of time. Find resources and support systems that can help you quit smoking and remain smoke-free after you quit. This information is not intended to replace advice given to you by your health care provider. Make sure you discuss any questions you have with your health care provider. Document Revised: 07/16/2021 Document Reviewed:  07/16/2021 Elsevier Patient Education  2024 Elsevier Inc.      Signed,   Caro Christmas, MD [facility], [facility] 12/28/23 2:58 PM

## 2023-12-28 NOTE — Patient Instructions (Addendum)
 Call urology - Dr. Rozanne Corners for follow up [phone]. Planned 6 months at your last visit in June 2024.  Please let me know if we can help when you are ready to quit smoking,  If cholesterol is elevated, will need to try higher dose of meds. No other changes for now.  Follow-up with your eye specialist. Take care!  Preventive Care 75-64 Years Old, Female Preventive care refers to lifestyle choices and visits with your health care provider that can promote health and wellness. Preventive care visits are also called wellness exams. What can I expect for my preventive care visit? Counseling Your health care provider may ask you questions about your: Medical history, including: Past medical problems. Family medical history. Pregnancy history. Current health, including: Menstrual cycle. Method of birth control. Emotional well-being. Home life and relationship well-being. Sexual activity and sexual health. Lifestyle, including: Alcohol , nicotine  or tobacco, and drug use. Access to firearms. Diet, exercise, and sleep habits. Work and work Astronomer. Sunscreen use. Safety issues such as seatbelt and bike helmet use. Physical exam Your health care provider will check your: Height and weight. These may be used to calculate your BMI (body mass index). BMI is a measurement that tells if you are at a healthy weight. Waist circumference. This measures the distance around your waistline. This measurement also tells if you are at a healthy weight and may help predict your risk of certain diseases, such as type 2 diabetes and high blood pressure. Heart rate and blood pressure. Body temperature. Skin for abnormal spots. What immunizations do I need?  Vaccines are usually given at various ages, according to a schedule. Your health care provider will recommend vaccines for you based on your age, medical history, and lifestyle or other factors, such as travel or where you work. What tests do I  need? Screening Your health care provider may recommend screening tests for certain conditions. This may include: Lipid and cholesterol levels. Diabetes screening. This is done by checking your blood sugar (glucose) after you have not eaten for a while (fasting). Pelvic exam and Pap test. Hepatitis B test. Hepatitis C test. HIV (human immunodeficiency virus) test. STI (sexually transmitted infection) testing, if you are at risk. Lung cancer screening. Colorectal cancer screening. Mammogram. Talk with your health care provider about when you should start having regular mammograms. This may depend on whether you have a family history of breast cancer. BRCA-related cancer screening. This may be done if you have a family history of breast, ovarian, tubal, or peritoneal cancers. Bone density scan. This is done to screen for osteoporosis. Talk with your health care provider about your test results, treatment options, and if necessary, the need for more tests. Follow these instructions at home: Eating and drinking  Eat a diet that includes fresh fruits and vegetables, whole grains, lean protein, and low-fat dairy products. Take vitamin and mineral supplements as recommended by your health care provider. Do not drink alcohol  if: Your health care provider tells you not to drink. You are pregnant, may be pregnant, or are planning to become pregnant. If you drink alcohol : Limit how much you have to 0-1 drink a day. Know how much alcohol  is in your drink. In the [place] one drink equals one 12 oz bottle of beer (355 mL), one 5 oz glass of wine (148 mL), or one 1 oz glass of hard liquor (44 mL). Lifestyle Brush your teeth every morning and night with fluoride toothpaste. Floss one time each day. Exercise for at  least 30 minutes 5 or more days each week. Do not use any products that contain nicotine  or tobacco. These products include cigarettes, chewing tobacco, and vaping devices, such as  e-cigarettes. If you need help quitting, ask your health care provider. Do not use drugs. If you are sexually active, practice safe sex. Use a condom or other form of protection to prevent STIs. If you do not wish to become pregnant, use a form of birth control. If you plan to become pregnant, see your health care provider for a prepregnancy visit. Take aspirin  only as told by your health care provider. Make sure that you understand how much to take and what form to take. Work with your health care provider to find out whether it is safe and beneficial for you to take aspirin  daily. Find healthy ways to manage stress, such as: Meditation, yoga, or listening to music. Journaling. Talking to a trusted person. Spending time with friends and family. Minimize exposure to UV radiation to reduce your risk of skin cancer. Safety Always wear your seat belt while driving or riding in a vehicle. Do not drive: If you have been drinking alcohol . Do not ride with someone who has been drinking. When you are tired or distracted. While texting. If you have been using any mind-altering substances or drugs. Wear a helmet and other protective equipment during sports activities. If you have firearms in your house, make sure you follow all gun safety procedures. Seek help if you have been physically or sexually abused. What's next? Visit your health care provider once a year for an annual wellness visit. Ask your health care provider how often you should have your eyes and teeth checked. Stay up to date on all vaccines. This information is not intended to replace advice given to you by your health care provider. Make sure you discuss any questions you have with your health care provider. Document Revised: 01/20/2021 Document Reviewed: 01/20/2021 Elsevier Patient Education  2024 Elsevier Inc.  Steps to Quit Smoking Smoking tobacco is the leading cause of preventable death. It can affect almost every organ in  the body. Smoking puts you and those around you at risk for developing many serious chronic diseases. Quitting smoking can be very challenging. Do not get discouraged if you are not successful the first time. Some people need to make many attempts to quit before they achieve long-term success. Do your best to stick to your quit plan, and talk with your health care provider if you have any questions or concerns. How do I get ready to quit? When you decide to quit smoking, create a plan to help you succeed. Before you quit: Pick a date to quit. Set a date within the next 2 weeks to give you time to prepare. Write down the reasons why you are quitting. Keep this list in places where you will see it often. Tell your family, friends, and co-workers that you are quitting. Support from people you are close to can make quitting easier. Talk with your health care provider about your options for quitting smoking. Find out what treatment options are covered by your health insurance. Identify people, places, things, and activities that make you want to smoke (triggers). Avoid them. What first steps can I take to quit smoking? Throw away all cigarettes at home, at work, and in your car. Throw away smoking accessories, such as Set designer. Clean your car. Make sure to empty the ashtray. Clean your home, including curtains and carpets.  What strategies can I use to quit smoking? Talk with your health care provider about combining strategies, such as taking medicines while you are also receiving in-person counseling. Using these two strategies together makes you more likely to succeed in quitting than if you used either strategy on its own. If you are pregnant or breastfeeding, talk with your health care provider about finding counseling or other support strategies to quit smoking. Do not take medicine to help you quit smoking unless your health care provider tells you to. Quit right away Quit smoking  completely, instead of gradually reducing how much you smoke over a period of time. Stopping smoking right away may be more successful than gradually quitting. Attend in-person counseling to help you build problem-solving skills. You are more likely to succeed in quitting if you attend counseling sessions regularly. Even short sessions of 10 minutes can be effective. Take medicine You may take medicines to help you quit smoking. Some medicines require a prescription. You can also purchase over-the-counter medicines. Medicines may have nicotine  in them to replace the nicotine  in cigarettes. Medicines may: Help to stop cravings. Help to relieve withdrawal symptoms. Your health care provider may recommend: Nicotine  patches, gum, or lozenges. Nicotine  inhalers or sprays. Non-nicotine  medicine that you take by mouth. Find resources Find resources and support systems that can help you quit smoking and remain smoke-free after you quit. These resources are most helpful when you use them often. They include: Online chats with a Veterinary surgeon. Telephone quitlines. Printed Materials engineer. Support groups or group counseling. Text messaging programs. Mobile phone apps or applications. Use apps that can help you stick to your quit plan by providing reminders, tips, and encouragement. Examples of free services include Quit Guide from the CDC and smokefree.gov  What can I do to make it easier to quit?  Reach out to your family and friends for support and encouragement. Call telephone quitlines, such as 1-800-QUIT-NOW, reach out to support groups, or work with a counselor for support. Ask people who smoke to avoid smoking around you. Avoid places that trigger you to smoke, such as bars, parties, or smoke-break areas at work. Spend time with people who do not smoke. Lessen the stress in your life. Stress can be a smoking trigger for some people. To lessen stress, try: Exercising regularly. Doing  deep-breathing exercises. Doing yoga. Meditating. What benefits will I see if I quit smoking? Over time, you should start to see positive results, such as: Improved sense of smell and taste. Decreased coughing and sore throat. Slower heart rate. Lower blood pressure. Clearer and healthier skin. The ability to breathe more easily. Fewer sick days. Summary Quitting smoking can be very challenging. Do not get discouraged if you are not successful the first time. Some people need to make many attempts to quit before they achieve long-term success. When you decide to quit smoking, create a plan to help you succeed. Quit smoking right away, not slowly over a period of time. Find resources and support systems that can help you quit smoking and remain smoke-free after you quit. This information is not intended to replace advice given to you by your health care provider. Make sure you discuss any questions you have with your health care provider. Document Revised: 07/16/2021 Document Reviewed: 07/16/2021 Elsevier Patient Education  2024 [employer].

## 2023-12-29 LAB — COMPREHENSIVE METABOLIC PANEL WITH GFR
ALT: 10 U/L (ref 0–35)
AST: 13 U/L (ref 0–37)
Albumin: 4.2 g/dL (ref 3.5–5.2)
Alkaline Phosphatase: 67 U/L (ref 39–117)
BUN: 12 mg/dL (ref 6–23)
CO2: 33 meq/L — ABNORMAL HIGH (ref 19–32)
Calcium: 9.2 mg/dL (ref 8.4–10.5)
Chloride: 99 meq/L (ref 96–112)
Creatinine, Ser: 0.71 mg/dL (ref 0.40–1.20)
GFR: 90.07 mL/min (ref >60.00)
Glucose, Bld: 108 mg/dL — ABNORMAL HIGH (ref 70–99)
Potassium: 4.4 meq/L (ref 3.5–5.1)
Sodium: 137 meq/L (ref 135–145)
Total Bilirubin: 0.3 mg/dL (ref 0.2–1.2)
Total Protein: 7 g/dL (ref 6.0–8.3)

## 2023-12-29 LAB — LIPID PANEL
Cholesterol: 227 mg/dL — ABNORMAL HIGH (ref 0–200)
HDL: 45.4 mg/dL (ref >39.00)
LDL Cholesterol: 122 mg/dL — ABNORMAL HIGH (ref 0–99)
NonHDL: 181.47
Total CHOL/HDL Ratio: 5
Triglycerides: 295 mg/dL — ABNORMAL HIGH (ref 0.0–149.0)
VLDL: 59 mg/dL — ABNORMAL HIGH (ref 0.0–40.0)

## 2023-12-29 LAB — HEMOGLOBIN A1C: Hgb A1c MFr Bld: 6.4 % (ref 4.6–6.5)

## 2023-12-30 ENCOUNTER — Encounter: Payer: Self-pay | Admitting: Family Medicine

## 2024-01-02 ENCOUNTER — Ambulatory Visit: Payer: Self-pay | Admitting: Family Medicine

## 2024-01-04 ENCOUNTER — Ambulatory Visit: Admitting: Podiatry

## 2024-01-18 ENCOUNTER — Encounter: Payer: Self-pay | Admitting: Podiatry

## 2024-01-18 ENCOUNTER — Ambulatory Visit (INDEPENDENT_AMBULATORY_CARE_PROVIDER_SITE_OTHER): Admitting: Podiatry

## 2024-01-18 VITALS — Ht 66.0 in | Wt 152.1 lb

## 2024-01-18 DIAGNOSIS — B351 Tinea unguium: Secondary | ICD-10-CM

## 2024-01-18 DIAGNOSIS — M79675 Pain in left toe(s): Secondary | ICD-10-CM

## 2024-01-18 DIAGNOSIS — M79674 Pain in right toe(s): Secondary | ICD-10-CM

## 2024-01-18 DIAGNOSIS — L84 Corns and callosities: Secondary | ICD-10-CM

## 2024-01-18 MED ORDER — TERBINAFINE HCL 250 MG PO TABS: 250.0000 mg | ORAL_TABLET | Freq: Every day | ORAL | 0 refills | Status: AC

## 2024-01-18 NOTE — Progress Notes (Signed)
 Chief Complaint  Patient presents with   Nail Problem    Pt is here to discuss results from nail pathology    HPI: 63 y.o. female presents today to review nail pathology.  The nail plates are thickened, dystrophic, elongated and painful with shoes and direct pressure. She also has painful hyperkeratotic calluses x 3 to the right foot underneath the right second metatarsal heads, and 2 to the left foot underneath the fifth metatarsal head and left plantar lateral heel.  She is a borderline diabetic and takes metformin .  Last A1c 6.4.  Past Medical History:  Diagnosis Date   Acquired hallux valgus of left foot    Bladder tumor    Callous ulcer (HCC)    Depression with anxiety    DEPRESSIVE DISORDER NOT ELSEWHERE CLASSIFIED    Diabetes mellitus without complication (HCC)    Phreesia 10/13/2020   Dyspnea    Foot deformity, acquired    Grief reaction    History of kidney stones    Hyperlipidemia    Phreesia 10/13/2020   Hypertension    Phreesia 10/13/2020   Myalgia and myositis, unspecified    Onychomycosis    Oral thrush    Pneumonia    4 months ago   Pre-diabetes    Rotator cuff syndrome of left shoulder    Rotator cuff tear, right    Shortness of breath    SHOULDER PAIN, RIGHT    Sleep apnea    Phreesia 10/13/2020   Sleep disorder    Urinary hesitancy     Past Surgical History:  Procedure Laterality Date   cryotherphy  1986   TRANSURETHRAL RESECTION OF BLADDER TUMOR N/A 12/21/2020   Procedure: TRANSURETHRAL RESECTION OF BLADDER TUMOR (TURBT)WITH CYSTOSTOPY/ BILATERAL RETROGRADE PYELOGRAM WITH LEFT STENT PLACEMENT;  Surgeon: Florencio Hunting, MD;  Location: [facility];  Service: Urology;  Laterality: N/A;  GENERAL ANESTHESIA WITHH PARALYSIS   TUBAL LIGATION      Allergies  Allergen Reactions   Codeine Nausea And Vomiting    ROS    Physical Exam: There were no vitals filed for this visit.  General: The patient is alert and oriented x3 in no acute  distress.  Dermatology: Skin is warm, dry and supple bilateral lower extremities. Interspaces are clear of maceration and debris.  Toenails are thickened, elongated, dystrophic with subungual debris present and yellow discoloration.  Left first toenail most affected.  Painful hyperkeratotic skin lesions present right forefoot x 3 distributed between the subsecond and subthird metatarsal head and x 2 to the left foot subfifth metatarsal head and plantar lateral heel.  Vascular: Palpable pedal pulses bilaterally. Capillary refill within normal limits.  No appreciable edema.  No erythema or calor.  Neurological: Light touch sensation grossly intact bilateral feet.   Musculoskeletal Exam: Fat pad atrophy noted.   Assessment/Plan of Care: 1. Pain due to onychomycosis of toenails of both feet      Meds ordered this encounter  Medications   terbinafine  (LAMISIL ) 250 MG tablet    Sig: Take 1 tablet (250 mg total) by mouth daily.    Dispense:  90 tablet    Refill:  0   None  Discussed clinical findings with patient today.  # Onychomycosis -Review nail pathology with patient.  Positive for onychomycosis - Treatment options discussed with patient.  Discussed course of oral terbinafine , did discuss potential risk of liver damage with this. - Did review recent AST ALT from 5/22 which was within normal  limits. - Will have patient obtain updated CBC and LFT in approximately 1 to 2 months to monitor for any changes to liver function  # Painful hyperkeratotic skin lesions x 3 right forefoot associated with 2nd and 3rd submetatarsal area, left foot hyperkeratotic skin lesions x 2 left subfifth metatarsal head and left plantar lateral heel -All symptomatic hyperkeratoses were safely debrided as a courtesy with a sterile #312 blade to patient's level of comfort without incident. We discussed preventative and palliative care of these lesions including supportive and accommodative shoegear, padding,  prefabricated and custom molded accommodative orthoses, use of a pumice stone and lotions/creams daily. - Patient does have borderline diabetes - Will likely need ABN going forward.   Jaleigh Mccroskey L. Lunda Salines, AACFAS [facility]     [address] [place], [place] [address]                Office [phone]  Fax [phone]

## 2024-01-21 NOTE — Progress Notes (Incomplete)
 Chief Complaint  Patient presents with  . Nail Problem    Pt is here to discuss results from nail pathology    HPI: 63 y.o. female presents today to review nail pathology.  The nail plates are thickened, dystrophic, elongated and painful with shoes and direct pressure. She also has painful hyperkeratotic calluses x 3 to the right foot underneath the right second metatarsal heads, and 2 to the left foot underneath the fifth metatarsal head and left plantar lateral heel.  She is a borderline diabetic and takes metformin .  Last A1c 6.4.  Past Medical History:  Diagnosis Date  . Acquired hallux valgus of left foot   . Bladder tumor   . Callous ulcer (HCC)   . Depression with anxiety   . DEPRESSIVE DISORDER NOT ELSEWHERE CLASSIFIED   . Diabetes mellitus without complication (HCC)    Phreesia 10/13/2020  . Dyspnea   . Foot deformity, acquired   . Grief reaction   . History of kidney stones   . Hyperlipidemia    Phreesia 10/13/2020  . Hypertension    Phreesia 10/13/2020  . Myalgia and myositis, unspecified   . Onychomycosis   . Oral thrush   . Pneumonia    4 months ago  . Pre-diabetes   . Rotator cuff syndrome of left shoulder   . Rotator cuff tear, right   . Shortness of breath   . SHOULDER PAIN, RIGHT   . Sleep apnea    Phreesia 10/13/2020  . Sleep disorder   . Urinary hesitancy     Past Surgical History:  Procedure Laterality Date  . cryotherphy  1986  . TRANSURETHRAL RESECTION OF BLADDER TUMOR N/A 12/21/2020   Procedure: TRANSURETHRAL RESECTION OF BLADDER TUMOR (TURBT)WITH CYSTOSTOPY/ BILATERAL RETROGRADE PYELOGRAM WITH LEFT STENT PLACEMENT;  Surgeon: Florencio Hunting, MD;  Location: [facility];  Service: Urology;  Laterality: N/A;  GENERAL ANESTHESIA WITHH PARALYSIS  . TUBAL LIGATION      Allergies  Allergen Reactions  . Codeine Nausea And Vomiting    ROS    Physical Exam: There were no vitals filed for this visit.  General: The patient is alert and  oriented x3 in no acute distress.  Dermatology: Skin is warm, dry and supple bilateral lower extremities. Interspaces are clear of maceration and debris.  Toenails are thickened, elongated, dystrophic with subungual debris present and yellow discoloration.  Left first toenail most affected.  Painful hyperkeratotic skin lesions present right forefoot x 3 distributed between the subsecond and subthird metatarsal head and x 2 to the left foot subfifth metatarsal head and plantar lateral heel.  Vascular: Palpable pedal pulses bilaterally. Capillary refill within normal limits.  No appreciable edema.  No erythema or calor.  Neurological: Light touch sensation grossly intact bilateral feet.   Musculoskeletal Exam: Fat pad atrophy noted.   Assessment/Plan of Care: 1. Pain due to onychomycosis of toenails of both feet      Meds ordered this encounter  Medications  . terbinafine  (LAMISIL ) 250 MG tablet    Sig: Take 1 tablet (250 mg total) by mouth daily.    Dispense:  90 tablet    Refill:  0   None  Discussed clinical findings with patient today.  # Onychomycosis -Review nail pathology with patient.  Positive for onychomycosis - Treatment options discussed with patient.  Discussed course of oral terbinafine     Duward Allbritton L. Lunda Salines, AACFAS [facility]     [address]  [place], [place] [address]                Office [phone]  Fax [phone]   Starting terbinafine  trimmed nails today  Painful poros x3 right sub 2nd and 3rd mets X2 left sub 5th and heel

## 2024-01-24 DIAGNOSIS — Z8551 Personal history of malignant neoplasm of bladder: Secondary | ICD-10-CM | POA: Diagnosis not present

## 2024-01-24 DIAGNOSIS — R8289 Other abnormal findings on cytological and histological examination of urine: Secondary | ICD-10-CM | POA: Diagnosis not present

## 2024-01-27 DIAGNOSIS — J441 Chronic obstructive pulmonary disease with (acute) exacerbation: Secondary | ICD-10-CM | POA: Diagnosis not present

## 2024-02-26 DIAGNOSIS — J441 Chronic obstructive pulmonary disease with (acute) exacerbation: Secondary | ICD-10-CM | POA: Diagnosis not present

## 2024-03-26 ENCOUNTER — Other Ambulatory Visit: Payer: Self-pay | Admitting: Family Medicine

## 2024-03-26 DIAGNOSIS — J309 Allergic rhinitis, unspecified: Secondary | ICD-10-CM

## 2024-03-26 DIAGNOSIS — E785 Hyperlipidemia, unspecified: Secondary | ICD-10-CM

## 2024-03-26 DIAGNOSIS — R7303 Prediabetes: Secondary | ICD-10-CM

## 2024-03-26 DIAGNOSIS — I10 Essential (primary) hypertension: Secondary | ICD-10-CM

## 2024-03-26 DIAGNOSIS — R059 Cough, unspecified: Secondary | ICD-10-CM

## 2024-03-28 DIAGNOSIS — J441 Chronic obstructive pulmonary disease with (acute) exacerbation: Secondary | ICD-10-CM | POA: Diagnosis not present

## 2024-04-19 ENCOUNTER — Encounter: Payer: Self-pay | Admitting: Podiatry

## 2024-04-19 ENCOUNTER — Ambulatory Visit (INDEPENDENT_AMBULATORY_CARE_PROVIDER_SITE_OTHER): Admitting: Podiatry

## 2024-04-19 DIAGNOSIS — B351 Tinea unguium: Secondary | ICD-10-CM

## 2024-04-19 DIAGNOSIS — L84 Corns and callosities: Secondary | ICD-10-CM | POA: Diagnosis not present

## 2024-04-19 DIAGNOSIS — M79674 Pain in right toe(s): Secondary | ICD-10-CM | POA: Diagnosis not present

## 2024-04-19 DIAGNOSIS — M79675 Pain in left toe(s): Secondary | ICD-10-CM | POA: Diagnosis not present

## 2024-04-19 DIAGNOSIS — Z79899 Other long term (current) drug therapy: Secondary | ICD-10-CM

## 2024-04-19 DIAGNOSIS — F17210 Nicotine dependence, cigarettes, uncomplicated: Secondary | ICD-10-CM | POA: Diagnosis not present

## 2024-04-19 DIAGNOSIS — J449 Chronic obstructive pulmonary disease, unspecified: Secondary | ICD-10-CM | POA: Diagnosis not present

## 2024-04-19 MED ORDER — TERBINAFINE HCL 250 MG PO TABS: 250.0000 mg | ORAL_TABLET | Freq: Every day | ORAL | 0 refills | Status: AC

## 2024-04-19 NOTE — Patient Instructions (Signed)
 Look for urea 40-47% nail gel, cream or ointment and apply to the thickened thickened toenails and/or  calluses. This can be bought over the counter, at a pharmacy or online such as [employer].

## 2024-04-19 NOTE — Progress Notes (Signed)
 Chief Complaint  Patient presents with   Nail Problem    Onychomycosis med check.  pt feels meds are working but slowly and nails still thick. Calluses are painful(5) Pre diabetic A1c 6.4  81mg  asprin    HPI: 63 y.o. female presents following up for onychomycosis of the toenails.  She has completed 13-month course of oral terbinafine .  She reports mild response.  She would like to discuss further treatment of this.  She is prediabetic.  She also reports significant painful recurrent calluses today affecting the left subsecond, left subfifth, left plantar heel x 3 along with 3 significant lesions to the right forefoot around the subsecond and subthird metatarsal head regions.  Past Medical History:  Diagnosis Date   Acquired hallux valgus of left foot    Bladder tumor    Callous ulcer (HCC)    Depression with anxiety    DEPRESSIVE DISORDER NOT ELSEWHERE CLASSIFIED    Diabetes mellitus without complication (HCC)    [employer]rees[employer] 10/13/2020   Dyspnea    Foot deformity, acquired    Grief reaction    History of kidney stones    Hyperlipidemia    [employer] 10/13/2020   Hypertension    [employer] 10/13/2020   Myalgia and myositis, unspecified    Onychomycosis    Oral thrush    Pneumonia    4 months ago   Pre-diabetes    Rotator cuff syndrome of left shoulder    Rotator cuff tear, right    Shortness of breath    SHOULDER PAIN, RIGHT    Sleep apnea    [employer] 10/13/2020   Sleep disorder    Urinary hesitancy     Past Surgical History:  Procedure Laterality Date   cryotherphy  1986   TRANSURETHRAL RESECTION OF BLADDER TUMOR N/A 12/21/2020   Procedure: TRANSURETHRAL RESECTION OF BLADDER TUMOR (TURBT)WITH CYSTOSTOPY/ BILATERAL RETROGRADE PYELOGRAM WITH LEFT STENT PLACEMENT;  Surgeon: Renda Glance, MD;  Location: [facility];  Service: Urology;  Laterality: N/A;  GENERAL ANESTHESIA WITHH PARALYSIS   TUBAL LIGATION      Allergies  Allergen Reactions   Codeine Nausea And Vomiting     ROS    Physical Exam: There were no vitals filed for this visit.  General: The patient is alert and oriented x3 in no acute distress.  Dermatology: Skin is warm, dry and supple bilateral lower extremities. Interspaces are clear of maceration and debris.  Toenails are thickened, elongated, dystrophic with subungual debris present and yellow discoloration.  Left first toenail most affected.  Painful hyperkeratotic skin lesions present right forefoot x 3 distributed between the subsecond and subthird metatarsal head and left foot subfirst, subfifth and left plantar heel x 3 total.  The hyperkeratotic skin lesions are nucleated and tender to touch.  Vascular: Palpable pedal pulses bilaterally. Capillary refill within normal limits.  No appreciable edema.  No erythema or calor.  Neurological: Light touch sensation grossly intact bilateral feet.   Musculoskeletal Exam: Fat pad atrophy noted.   Assessment/Plan of Care: 1. Pain due to onychomycosis of toenails of both feet   2. Callus   3. Encounter for long-term (current) use of high-risk medication      Meds ordered this encounter  Medications   terbinafine  (LAMISIL ) 250 MG tablet    Sig: Take 1 tablet (250 mg total) by mouth daily.    Dispense:  90 tablet    Refill:  0   None  Discussed clinical findings with patient today.  #  Onychomycosis with pain to the toenails # Encounter for current long-term use of high-risk medication - She has had mild response after 15-month course of oral terbinafine  -She expresses that she would like to continue with this treatment  -Obtain updated LFT - New course of oral terbinafine  sent in, will notify him of any pertinent changes to hepatic function exam. - Due to extended course will have patient follow-up monthly for lab work -Did discuss that some changes may be due to trauma over time, she can also try applying topical urea-based nail gel to the nails 1-2 times a day  Procedure: Nail  Debridement Rationale: Pain Type of Debridement: manual, sharp debridement. Instrumentation: Nail nipper, rotary burr. Number of Nails: 10   # Painful hyperkeratotic skin lesions x 6 total - Right foot x 3 lesions distributed between the subsecond and subthird metatarsal head region.  Left foot x 3 between the left subfirst, left subfifth metatarsal heads and left plantar heel -All symptomatic hyperkeratoses were safely debrided as a courtesy with a sterile #312 blade to patient's level of comfort without incident. We discussed preventative and palliative care of these lesions including supportive and accommodative shoegear, padding, prefabricated and custom molded accommodative orthoses, use of a pumice stone and lotions/creams daily. - ABN on file.  She does have borderline diabetes.   Pauline Trainer L. Lamount MAUL, AACFAS [facility]     [address] [place], [place] [address]                Office [phone]  Fax [phone]

## 2024-04-26 DIAGNOSIS — M79675 Pain in left toe(s): Secondary | ICD-10-CM | POA: Diagnosis not present

## 2024-04-26 DIAGNOSIS — M79674 Pain in right toe(s): Secondary | ICD-10-CM | POA: Diagnosis not present

## 2024-04-27 LAB — HEPATIC FUNCTION PANEL
ALT: 12 IU/L (ref 0–32)
AST: 14 IU/L (ref 0–40)
Albumin: 4.3 g/dL (ref 3.9–4.9)
Alkaline Phosphatase: 82 IU/L (ref 49–135)
Bilirubin Total: 0.3 mg/dL (ref 0.0–1.2)
Bilirubin, Direct: 0.12 mg/dL (ref 0.00–0.40)
Total Protein: 7 g/dL (ref 6.0–8.5)

## 2024-05-22 DIAGNOSIS — Z7951 Long term (current) use of inhaled steroids: Secondary | ICD-10-CM | POA: Diagnosis not present

## 2024-05-22 DIAGNOSIS — J449 Chronic obstructive pulmonary disease, unspecified: Secondary | ICD-10-CM | POA: Diagnosis not present

## 2024-05-28 DIAGNOSIS — J441 Chronic obstructive pulmonary disease with (acute) exacerbation: Secondary | ICD-10-CM | POA: Diagnosis not present

## 2024-06-12 ENCOUNTER — Ambulatory Visit: Admitting: Family Medicine

## 2024-07-30 ENCOUNTER — Ambulatory Visit

## 2024-08-03 ENCOUNTER — Other Ambulatory Visit: Payer: Self-pay | Admitting: Family Medicine

## 2024-08-03 DIAGNOSIS — F418 Other specified anxiety disorders: Secondary | ICD-10-CM

## 2024-08-16 ENCOUNTER — Other Ambulatory Visit: Payer: Self-pay | Admitting: Family Medicine

## 2024-08-16 DIAGNOSIS — Z1231 Encounter for screening mammogram for malignant neoplasm of breast: Secondary | ICD-10-CM

## 2024-09-06 ENCOUNTER — Ambulatory Visit

## 2024-09-13 ENCOUNTER — Other Ambulatory Visit: Payer: Self-pay | Admitting: Family Medicine

## 2024-09-13 DIAGNOSIS — J309 Allergic rhinitis, unspecified: Secondary | ICD-10-CM

## 2024-09-13 DIAGNOSIS — R059 Cough, unspecified: Secondary | ICD-10-CM

## 2024-09-13 NOTE — Telephone Encounter (Signed)
Patient is establishing care with another provider.

## 2024-10-17 ENCOUNTER — Encounter: Admitting: Nurse Practitioner
